# Patient Record
Sex: Female | Born: 1945 | Race: White | Hispanic: No | Marital: Married | Smoking: Never smoker
Health system: Southern US, Community
[De-identification: ages and names within clinical notes are randomized; demographics above are authoritative.]

## PROBLEM LIST (undated history)

## (undated) DIAGNOSIS — R51 Headache: Secondary | ICD-10-CM

## (undated) DIAGNOSIS — R519 Headache, unspecified: Secondary | ICD-10-CM

## (undated) DIAGNOSIS — Z9889 Other specified postprocedural states: Secondary | ICD-10-CM

## (undated) DIAGNOSIS — R112 Nausea with vomiting, unspecified: Secondary | ICD-10-CM

## (undated) DIAGNOSIS — L719 Rosacea, unspecified: Secondary | ICD-10-CM

## (undated) DIAGNOSIS — M8580 Other specified disorders of bone density and structure, unspecified site: Secondary | ICD-10-CM

## (undated) DIAGNOSIS — K5790 Diverticulosis of intestine, part unspecified, without perforation or abscess without bleeding: Secondary | ICD-10-CM

## (undated) DIAGNOSIS — I10 Essential (primary) hypertension: Secondary | ICD-10-CM

## (undated) DIAGNOSIS — L570 Actinic keratosis: Secondary | ICD-10-CM

## (undated) DIAGNOSIS — M1990 Unspecified osteoarthritis, unspecified site: Secondary | ICD-10-CM

## (undated) DIAGNOSIS — M2550 Pain in unspecified joint: Secondary | ICD-10-CM

## (undated) HISTORY — PX: VAGINAL HYSTERECTOMY: SUR661

## (undated) HISTORY — DX: Rosacea, unspecified: L71.9

## (undated) HISTORY — PX: COLONOSCOPY: SHX174

## (undated) HISTORY — PX: REFRACTIVE SURGERY: SHX103

## (undated) HISTORY — PX: JOINT REPLACEMENT: SHX530

## (undated) HISTORY — PX: TONSILLECTOMY: SUR1361

## (undated) HISTORY — PX: CHOLECYSTECTOMY: SHX55

## (undated) HISTORY — DX: Actinic keratosis: L57.0

---

## 1999-02-13 ENCOUNTER — Other Ambulatory Visit: Admission: RE | Admit: 1999-02-13 | Discharge: 1999-02-13 | Payer: Self-pay | Admitting: Obstetrics and Gynecology

## 2000-03-11 ENCOUNTER — Other Ambulatory Visit: Admission: RE | Admit: 2000-03-11 | Discharge: 2000-03-11 | Payer: Self-pay | Admitting: Obstetrics and Gynecology

## 2001-03-23 ENCOUNTER — Other Ambulatory Visit: Admission: RE | Admit: 2001-03-23 | Discharge: 2001-03-23 | Payer: Self-pay | Admitting: Obstetrics and Gynecology

## 2002-03-23 ENCOUNTER — Other Ambulatory Visit: Admission: RE | Admit: 2002-03-23 | Discharge: 2002-03-23 | Payer: Self-pay | Admitting: Obstetrics and Gynecology

## 2003-04-17 ENCOUNTER — Other Ambulatory Visit: Admission: RE | Admit: 2003-04-17 | Discharge: 2003-04-17 | Payer: Self-pay | Admitting: Obstetrics and Gynecology

## 2004-04-17 ENCOUNTER — Other Ambulatory Visit: Admission: RE | Admit: 2004-04-17 | Discharge: 2004-04-17 | Payer: Self-pay | Admitting: Obstetrics and Gynecology

## 2004-06-04 ENCOUNTER — Ambulatory Visit: Payer: Self-pay | Admitting: Obstetrics and Gynecology

## 2005-04-18 ENCOUNTER — Other Ambulatory Visit: Admission: RE | Admit: 2005-04-18 | Discharge: 2005-04-18 | Payer: Self-pay | Admitting: Obstetrics and Gynecology

## 2005-06-30 ENCOUNTER — Ambulatory Visit: Payer: Self-pay | Admitting: Obstetrics and Gynecology

## 2005-07-04 ENCOUNTER — Ambulatory Visit: Payer: Self-pay | Admitting: Obstetrics and Gynecology

## 2006-06-17 ENCOUNTER — Other Ambulatory Visit: Admission: RE | Admit: 2006-06-17 | Discharge: 2006-06-17 | Payer: Self-pay | Admitting: Obstetrics and Gynecology

## 2006-07-02 ENCOUNTER — Ambulatory Visit: Payer: Self-pay | Admitting: Obstetrics and Gynecology

## 2007-06-21 ENCOUNTER — Other Ambulatory Visit: Admission: RE | Admit: 2007-06-21 | Discharge: 2007-06-21 | Payer: Self-pay | Admitting: Obstetrics and Gynecology

## 2007-08-05 ENCOUNTER — Ambulatory Visit: Payer: Self-pay | Admitting: Obstetrics and Gynecology

## 2008-06-22 ENCOUNTER — Other Ambulatory Visit: Admission: RE | Admit: 2008-06-22 | Discharge: 2008-06-22 | Payer: Self-pay | Admitting: Obstetrics and Gynecology

## 2008-06-22 ENCOUNTER — Encounter: Payer: Self-pay | Admitting: Obstetrics and Gynecology

## 2008-06-22 ENCOUNTER — Ambulatory Visit: Payer: Self-pay | Admitting: Obstetrics and Gynecology

## 2009-06-28 ENCOUNTER — Ambulatory Visit: Payer: Self-pay | Admitting: Obstetrics and Gynecology

## 2009-06-28 ENCOUNTER — Other Ambulatory Visit: Admission: RE | Admit: 2009-06-28 | Discharge: 2009-06-28 | Payer: Self-pay | Admitting: Obstetrics and Gynecology

## 2009-08-09 ENCOUNTER — Ambulatory Visit: Payer: Self-pay | Admitting: Obstetrics and Gynecology

## 2009-10-26 LAB — HM COLONOSCOPY: HM Colonoscopy: NORMAL

## 2010-07-16 ENCOUNTER — Encounter (INDEPENDENT_AMBULATORY_CARE_PROVIDER_SITE_OTHER): Payer: BC Managed Care – PPO | Admitting: Obstetrics and Gynecology

## 2010-07-16 ENCOUNTER — Other Ambulatory Visit (HOSPITAL_COMMUNITY)
Admission: RE | Admit: 2010-07-16 | Discharge: 2010-07-16 | Disposition: A | Discharge status: Home / Self Care | Payer: BC Managed Care – PPO | Source: Ambulatory Visit | Attending: Obstetrics and Gynecology | Admitting: Obstetrics and Gynecology

## 2010-07-16 ENCOUNTER — Other Ambulatory Visit: Payer: Self-pay | Admitting: Obstetrics and Gynecology

## 2010-07-16 DIAGNOSIS — Z13220 Encounter for screening for lipoid disorders: Secondary | ICD-10-CM

## 2010-07-16 DIAGNOSIS — Z124 Encounter for screening for malignant neoplasm of cervix: Secondary | ICD-10-CM | POA: Insufficient documentation

## 2010-07-16 DIAGNOSIS — Z833 Family history of diabetes mellitus: Secondary | ICD-10-CM

## 2010-07-16 DIAGNOSIS — Z01419 Encounter for gynecological examination (general) (routine) without abnormal findings: Secondary | ICD-10-CM

## 2010-08-13 ENCOUNTER — Ambulatory Visit: Payer: Self-pay | Admitting: Obstetrics and Gynecology

## 2010-10-27 LAB — HM PAP SMEAR: HM Pap smear: NORMAL

## 2011-04-15 HISTORY — PX: EYE SURGERY: SHX253

## 2011-07-08 ENCOUNTER — Encounter: Payer: Self-pay | Admitting: Gynecology

## 2011-07-08 DIAGNOSIS — L719 Rosacea, unspecified: Secondary | ICD-10-CM | POA: Insufficient documentation

## 2011-07-08 DIAGNOSIS — N938 Other specified abnormal uterine and vaginal bleeding: Secondary | ICD-10-CM | POA: Insufficient documentation

## 2011-07-18 ENCOUNTER — Encounter: Payer: Self-pay | Admitting: Obstetrics and Gynecology

## 2011-07-18 ENCOUNTER — Ambulatory Visit (INDEPENDENT_AMBULATORY_CARE_PROVIDER_SITE_OTHER): Payer: BC Managed Care – PPO | Admitting: Obstetrics and Gynecology

## 2011-07-18 VITALS — BP 160/104 | Ht 60.0 in | Wt 166.0 lb

## 2011-07-18 DIAGNOSIS — Z833 Family history of diabetes mellitus: Secondary | ICD-10-CM

## 2011-07-18 DIAGNOSIS — Z01419 Encounter for gynecological examination (general) (routine) without abnormal findings: Secondary | ICD-10-CM

## 2011-07-18 NOTE — Progress Notes (Signed)
Patient came to see me today for her annual GYN exam. She is doing fine without menopausal symptoms. She is scheduled for mammogram for next month. She is a normal bone density. She is having no vaginal bleeding. She is having no pelvic pain. Her blood pressure was elevated today. It has also been elevated at home.  HEENT: Within normal limits. Kennon Portela present Neck: No masses. Supraclavicular lymph nodes: Not enlarged. Breasts: Examined in both sitting and lying position. Symmetrical without skin changes or masses. Abdomen: Soft no masses guarding or rebound. No hernias. Pelvic: External within normal limits. BUS within normal limits. Vaginal examination shows good estrogen effect, no cystocele enterocele or rectocele. Cervix and uterus absent. Adnexa within normal limits. Rectovaginal confirmatory. Extremities within normal limits.  Assessment: Hypertension  Plan: Patient to get PCP now and see them regarding her blood pressure. Continue yearly mammograms.

## 2011-07-19 LAB — CBC WITH DIFFERENTIAL/PLATELET
Basophils Relative: 1 % (ref 0–1)
HCT: 44.1 % (ref 36.0–46.0)
Hemoglobin: 14.7 g/dL (ref 12.0–15.0)
Lymphs Abs: 2.6 10*3/uL (ref 0.7–4.0)
MCH: 30.2 pg (ref 26.0–34.0)
MCHC: 33.3 g/dL (ref 30.0–36.0)
Monocytes Absolute: 0.5 10*3/uL (ref 0.1–1.0)
Monocytes Relative: 8 % (ref 3–12)
Neutro Abs: 3.1 10*3/uL (ref 1.7–7.7)
RBC: 4.86 MIL/uL (ref 3.87–5.11)

## 2011-07-19 LAB — URINALYSIS W MICROSCOPIC + REFLEX CULTURE
Casts: NONE SEEN
Crystals: NONE SEEN
Leukocytes, UA: NEGATIVE
Nitrite: NEGATIVE
Specific Gravity, Urine: 1.012 (ref 1.005–1.030)
Urobilinogen, UA: 0.2 mg/dL (ref 0.0–1.0)
pH: 5 (ref 5.0–8.0)

## 2011-07-19 LAB — HEMOGLOBIN A1C
Hgb A1c MFr Bld: 5.6 % (ref <5.7)
Mean Plasma Glucose: 114 mg/dL (ref <117)

## 2011-07-19 LAB — CHOLESTEROL, TOTAL: Cholesterol: 167 mg/dL (ref 0–200)

## 2011-07-28 LAB — HM MAMMOGRAPHY: HM Mammogram: NORMAL

## 2011-08-14 ENCOUNTER — Ambulatory Visit: Payer: Self-pay | Admitting: Obstetrics and Gynecology

## 2011-08-20 ENCOUNTER — Encounter: Payer: Self-pay | Admitting: Obstetrics and Gynecology

## 2011-09-12 ENCOUNTER — Ambulatory Visit: Payer: BC Managed Care – PPO | Admitting: Internal Medicine

## 2011-10-27 ENCOUNTER — Ambulatory Visit (INDEPENDENT_AMBULATORY_CARE_PROVIDER_SITE_OTHER): Payer: BC Managed Care – PPO | Admitting: Internal Medicine

## 2011-10-27 ENCOUNTER — Encounter: Payer: Self-pay | Admitting: Internal Medicine

## 2011-10-27 VITALS — BP 162/100 | HR 90 | Temp 98.1°F | Ht 60.25 in | Wt 166.5 lb

## 2011-10-27 DIAGNOSIS — R229 Localized swelling, mass and lump, unspecified: Secondary | ICD-10-CM | POA: Insufficient documentation

## 2011-10-27 DIAGNOSIS — I10 Essential (primary) hypertension: Secondary | ICD-10-CM

## 2011-10-27 LAB — COMPREHENSIVE METABOLIC PANEL
Alkaline Phosphatase: 61 U/L (ref 39–117)
BUN: 20 mg/dL (ref 6–23)
Creatinine, Ser: 0.8 mg/dL (ref 0.4–1.2)
Glucose, Bld: 100 mg/dL — ABNORMAL HIGH (ref 70–99)
Sodium: 141 mEq/L (ref 135–145)
Total Bilirubin: 0.5 mg/dL (ref 0.3–1.2)

## 2011-10-27 LAB — MICROALBUMIN / CREATININE URINE RATIO: Microalb, Ur: 0.3 mg/dL (ref 0.0–1.9)

## 2011-10-27 NOTE — Assessment & Plan Note (Signed)
Freely movable subcutaneous nodule approximately one meter diameter over her left knee. Appearance most consistent with  lipoma. Discussed potential referral to surgery for resection. Patient would prefer to hold off for now. We'll continue to monitor.

## 2011-10-27 NOTE — Progress Notes (Signed)
Subjective:    Patient ID: Katherine Stanley, female    DOB: 05/28/1945, 66 y.o.   MRN: [id number]  HPI 66 year old female presents to establish care. She reports she is generally feeling well. She did not previously have a primary care physician. She has been followed by her OB/GYN. She notes that, at times, her blood pressure has been elevated, but recently at home has been near 120/80. She denies any headache, chest pain, palpitations. She notes that her blood pressure is elevated when visiting physicians, or on occasion after certain foods.  Outpatient Encounter Prescriptions as of 10/27/2011  Medication Sig Dispense Refill  . Acetaminophen (TYLENOL 8 HOUR PO) Take by mouth.      . Ascorbic Acid (VITAMIN C PO) Take by mouth.      . Azelaic Acid (FINACEA EX) Apply topically.      Marland Kitchen b complex vitamins tablet Take 1 tablet by mouth daily.      . Calcium Carbonate-Vitamin D (CALCIUM + D PO) Take by mouth.      . Cholecalciferol (VITAMIN D PO) Take by mouth.      . folic acid (FOLVITE) 400 MCG tablet Take 400 mcg by mouth daily.      . Garlic 10 MG CAPS Take 1 tablet by mouth daily.      Marland Kitchen LYSINE PO Take by mouth.      Marland Kitchen POTASSIUM PO Take by mouth.      Marland Kitchen VITAMIN E PO Take by mouth.        Review of Systems  Constitutional: Negative for fever, chills, appetite change, fatigue and unexpected weight change.  HENT: Negative for ear pain, congestion, sore throat, trouble swallowing, neck pain, voice change and sinus pressure.   Eyes: Negative for visual disturbance.  Respiratory: Negative for cough, shortness of breath, wheezing and stridor.   Cardiovascular: Negative for chest pain, palpitations and leg swelling.  Gastrointestinal: Negative for nausea, vomiting, abdominal pain, diarrhea, constipation, blood in stool, abdominal distention and anal bleeding.  Genitourinary: Negative for dysuria and flank pain.  Musculoskeletal: Negative for myalgias, arthralgias and gait problem.  Skin:  Negative for color change and rash.  Neurological: Negative for dizziness and headaches.  Hematological: Negative for adenopathy. Does not bruise/bleed easily.  Psychiatric/Behavioral: Negative for suicidal ideas, disturbed wake/sleep cycle and dysphoric mood. The patient is not nervous/anxious.    BP 162/100  Pulse 90  Temp 98.1 F (36.7 C) (Oral)  Ht 5' 0.25" (1.53 m)  Wt 166 lb 8 oz (75.524 kg)  BMI 32.25 kg/m2  SpO2 96%     Objective:   Physical Exam  Constitutional: She is oriented to person, place, and time. She appears well-developed and well-nourished. No distress.  HENT:  Head: Normocephalic and atraumatic.  Right Ear: External ear normal.  Left Ear: External ear normal.  Nose: Nose normal.  Mouth/Throat: Oropharynx is clear and moist. No oropharyngeal exudate.  Eyes: Conjunctivae are normal. Pupils are equal, round, and reactive to light. Right eye exhibits no discharge. Left eye exhibits no discharge. No scleral icterus.  Neck: Normal range of motion. Neck supple. No tracheal deviation present. No thyromegaly present.  Cardiovascular: Normal rate, regular rhythm, normal heart sounds and intact distal pulses.  Exam reveals no gallop and no friction rub.   No murmur heard. Pulmonary/Chest: Effort normal and breath sounds normal. No respiratory distress. She has no wheezes. She has no rales. She exhibits no tenderness.  Abdominal: Soft. Bowel sounds are normal. She exhibits no distension and  no mass. There is no tenderness. There is no guarding.  Musculoskeletal: Normal range of motion. She exhibits no edema and no tenderness.  Lymphadenopathy:    She has no cervical adenopathy.  Neurological: She is alert and oriented to person, place, and time. No cranial nerve deficit. She exhibits normal muscle tone. Coordination normal.  Skin: Skin is warm and dry. No rash noted. She is not diaphoretic. No erythema. No pallor.     Psychiatric: She has a normal mood and affect. Her  behavior is normal. Judgment and thought content normal.          Assessment & Plan:

## 2011-10-27 NOTE — Assessment & Plan Note (Signed)
Blood pressure elevated today. Patient reports blood pressure is better controlled at home. She will check her blood pressure one to 2 times per week and bring record of blood pressure readings to next visit or e-mail with results. If no improvement, would favor adding medication to better control blood pressure. Will check renal function with labs today. Followup one month.

## 2011-10-27 NOTE — Patient Instructions (Signed)
Please check blood pressure at home 1-2 times per week and email with results.

## 2011-11-27 ENCOUNTER — Encounter: Payer: Self-pay | Admitting: Internal Medicine

## 2011-11-27 ENCOUNTER — Ambulatory Visit (INDEPENDENT_AMBULATORY_CARE_PROVIDER_SITE_OTHER): Payer: BC Managed Care – PPO | Admitting: Internal Medicine

## 2011-11-27 VITALS — BP 162/94 | HR 82 | Temp 98.5°F | Ht 60.25 in | Wt 165.0 lb

## 2011-11-27 DIAGNOSIS — I10 Essential (primary) hypertension: Secondary | ICD-10-CM

## 2011-11-27 MED ORDER — LISINOPRIL 5 MG PO TABS: 5.0000 mg | ORAL_TABLET | Freq: Every day | ORAL | Status: DC

## 2011-11-27 NOTE — Assessment & Plan Note (Signed)
BP continues to be elevated. Will start lisinopril 5mg  daily. Check Cr and K next week. Follow up 1 month.

## 2011-11-27 NOTE — Progress Notes (Signed)
Subjective:    Patient ID: Katherine Stanley, female    DOB: 1945/08/06, 66 y.o.   MRN: [id number]  HPI 66YO female with HTN presents for follow up. BP typically 140s/90s at home this month. No chest pain, headache, palpitations, change in vision. Not currently taking meds for BP  Outpatient Encounter Prescriptions as of 11/27/2011  Medication Sig Dispense Refill  . Acetaminophen (TYLENOL 8 HOUR PO) Take by mouth.      . Ascorbic Acid (VITAMIN C PO) Take by mouth.      . Azelaic Acid (FINACEA EX) Apply topically.      Marland Kitchen b complex vitamins tablet Take 1 tablet by mouth daily.      . Calcium Carbonate-Vitamin D (CALCIUM + D PO) Take by mouth.      . Cholecalciferol (VITAMIN D PO) Take by mouth.      . folic acid (FOLVITE) 400 MCG tablet Take 400 mcg by mouth daily.      . Garlic 10 MG CAPS Take 1 tablet by mouth daily.      Marland Kitchen LYSINE PO Take by mouth.      Marland Kitchen POTASSIUM PO Take by mouth.      Marland Kitchen VITAMIN E PO Take by mouth.      Marland Kitchen lisinopril (PRINIVIL,ZESTRIL) 5 MG tablet Take 1 tablet (5 mg total) by mouth daily.  90 tablet  3   BP 162/94  Pulse 82  Temp 98.5 F (36.9 C) (Oral)  Ht 5' 0.25" (1.53 m)  Wt 165 lb (74.844 kg)  BMI 31.96 kg/m2  SpO2 93%  Review of Systems  Constitutional: Negative for fever, chills, appetite change, fatigue and unexpected weight change.  HENT: Negative for ear pain, congestion, sore throat, trouble swallowing, neck pain, voice change and sinus pressure.   Eyes: Negative for visual disturbance.  Respiratory: Negative for cough, shortness of breath, wheezing and stridor.   Cardiovascular: Negative for chest pain, palpitations and leg swelling.  Gastrointestinal: Negative for nausea, vomiting, abdominal pain, diarrhea, constipation, blood in stool, abdominal distention and anal bleeding.  Genitourinary: Negative for dysuria and flank pain.  Musculoskeletal: Negative for myalgias, arthralgias and gait problem.  Skin: Negative for color change and rash.    Neurological: Negative for dizziness and headaches.  Hematological: Negative for adenopathy. Does not bruise/bleed easily.  Psychiatric/Behavioral: Negative for suicidal ideas, disturbed wake/sleep cycle and dysphoric mood. The patient is not nervous/anxious.        Objective:   Physical Exam  Constitutional: She is oriented to person, place, and time. She appears well-developed and well-nourished. No distress.  HENT:  Head: Normocephalic and atraumatic.  Right Ear: External ear normal.  Left Ear: External ear normal.  Nose: Nose normal.  Mouth/Throat: Oropharynx is clear and moist. No oropharyngeal exudate.  Eyes: Conjunctivae are normal. Pupils are equal, round, and reactive to light. Right eye exhibits no discharge. Left eye exhibits no discharge. No scleral icterus.  Neck: Normal range of motion. Neck supple. No tracheal deviation present. No thyromegaly present.  Cardiovascular: Normal rate, regular rhythm, normal heart sounds and intact distal pulses.  Exam reveals no gallop and no friction rub.   No murmur heard. Pulmonary/Chest: Effort normal and breath sounds normal. No respiratory distress. She has no wheezes. She has no rales. She exhibits no tenderness.  Musculoskeletal: Normal range of motion. She exhibits no edema and no tenderness.  Lymphadenopathy:    She has no cervical adenopathy.  Neurological: She is alert and oriented to person, place, and time.  No cranial nerve deficit. She exhibits normal muscle tone. Coordination normal.  Skin: Skin is warm and dry. No rash noted. She is not diaphoretic. No erythema. No pallor.  Psychiatric: She has a normal mood and affect. Her behavior is normal. Judgment and thought content normal.          Assessment & Plan:

## 2011-12-03 ENCOUNTER — Encounter: Payer: Self-pay | Admitting: Internal Medicine

## 2011-12-04 ENCOUNTER — Other Ambulatory Visit (INDEPENDENT_AMBULATORY_CARE_PROVIDER_SITE_OTHER): Payer: BC Managed Care – PPO | Admitting: *Deleted

## 2011-12-04 DIAGNOSIS — Z79899 Other long term (current) drug therapy: Secondary | ICD-10-CM

## 2011-12-04 LAB — BASIC METABOLIC PANEL
BUN: 20 mg/dL (ref 6–23)
Calcium: 9.2 mg/dL (ref 8.4–10.5)
Creatinine, Ser: 0.6 mg/dL (ref 0.4–1.2)
GFR: 110.95 mL/min (ref >60.00)

## 2011-12-05 ENCOUNTER — Other Ambulatory Visit: Payer: BC Managed Care – PPO

## 2012-01-02 ENCOUNTER — Ambulatory Visit (INDEPENDENT_AMBULATORY_CARE_PROVIDER_SITE_OTHER): Payer: BC Managed Care – PPO | Admitting: Internal Medicine

## 2012-01-02 ENCOUNTER — Encounter: Payer: Self-pay | Admitting: Internal Medicine

## 2012-01-02 VITALS — BP 120/92 | HR 88 | Temp 98.6°F | Ht 60.25 in | Wt 162.5 lb

## 2012-01-02 DIAGNOSIS — I10 Essential (primary) hypertension: Secondary | ICD-10-CM

## 2012-01-02 MED ORDER — LISINOPRIL 5 MG PO TABS: 5.0000 mg | ORAL_TABLET | Freq: Every day | ORAL | Status: DC

## 2012-01-02 NOTE — Progress Notes (Signed)
Subjective:    Patient ID: Katherine Stanley, female    DOB: [birth year], 66 y.o.   MRN: [id number]  HPI 66YO female with HTN presents for follow up. Started on Lisinopril at last visit. Tolerating medication well with no noted side effects. Denies chest pain, palpitations, headache. BP at home 110-120s/60-80s mostly.  Outpatient Encounter Prescriptions as of 01/02/2012  Medication Sig Dispense Refill  . Acetaminophen (TYLENOL 8 HOUR PO) Take by mouth.      . Ascorbic Acid (VITAMIN C PO) Take by mouth.      . Azelaic Acid (FINACEA EX) Apply topically.      Marland Kitchen b complex vitamins tablet Take 1 tablet by mouth daily.      . Calcium Carbonate-Vitamin D (CALCIUM + D PO) Take by mouth.      . Cholecalciferol (VITAMIN D PO) Take by mouth.      . folic acid (FOLVITE) 400 MCG tablet Take 400 mcg by mouth daily.      . Garlic 10 MG CAPS Take 1 tablet by mouth daily.      Marland Kitchen lisinopril (PRINIVIL,ZESTRIL) 5 MG tablet Take 1 tablet (5 mg total) by mouth daily.  90 tablet  3  . LYSINE PO Take by mouth.      Marland Kitchen VITAMIN E PO Take by mouth.      . DISCONTD: lisinopril (PRINIVIL,ZESTRIL) 5 MG tablet Take 1 tablet (5 mg total) by mouth daily.  90 tablet  3  . DISCONTD: POTASSIUM PO Take by mouth.       BP 120/92  Pulse 88  Temp 98.6 F (37 C) (Oral)  Ht 5' 0.25" (1.53 m)  Wt 162 lb 8 oz (73.71 kg)  BMI 31.47 kg/m2  SpO2 98%  Review of Systems  Constitutional: Negative for fever, chills, appetite change, fatigue and unexpected weight change.  HENT: Negative for ear pain, congestion, sore throat, trouble swallowing, neck pain, voice change and sinus pressure.   Eyes: Negative for visual disturbance.  Respiratory: Negative for cough, shortness of breath, wheezing and stridor.   Cardiovascular: Negative for chest pain, palpitations and leg swelling.  Gastrointestinal: Negative for nausea, vomiting, abdominal pain, diarrhea, constipation, blood in stool, abdominal distention and anal bleeding.    Genitourinary: Negative for dysuria and flank pain.  Musculoskeletal: Negative for myalgias, arthralgias and gait problem.  Skin: Negative for color change and rash.  Neurological: Negative for dizziness and headaches.  Hematological: Negative for adenopathy. Does not bruise/bleed easily.  Psychiatric/Behavioral: Negative for suicidal ideas, disturbed wake/sleep cycle and dysphoric mood. The patient is not nervous/anxious.        Objective:   Physical Exam  Constitutional: She is oriented to person, place, and time. She appears well-developed and well-nourished. No distress.  HENT:  Head: Normocephalic and atraumatic.  Right Ear: External ear normal.  Left Ear: External ear normal.  Nose: Nose normal.  Mouth/Throat: Oropharynx is clear and moist. No oropharyngeal exudate.  Eyes: Conjunctivae normal are normal. Pupils are equal, round, and reactive to light. Right eye exhibits no discharge. Left eye exhibits no discharge. No scleral icterus.  Neck: Normal range of motion. Neck supple. No tracheal deviation present. No thyromegaly present.  Cardiovascular: Normal rate, regular rhythm, normal heart sounds and intact distal pulses.  Exam reveals no gallop and no friction rub.   No murmur heard. Pulmonary/Chest: Effort normal and breath sounds normal. No respiratory distress. She has no wheezes. She has no rales. She exhibits no tenderness.  Musculoskeletal: Normal range of motion.  She exhibits no edema and no tenderness.  Lymphadenopathy:    She has no cervical adenopathy.  Neurological: She is alert and oriented to person, place, and time. No cranial nerve deficit. She exhibits normal muscle tone. Coordination normal.  Skin: Skin is warm and dry. No rash noted. She is not diaphoretic. No erythema. No pallor.  Psychiatric: She has a normal mood and affect. Her behavior is normal. Judgment and thought content normal.          Assessment & Plan:

## 2012-01-02 NOTE — Assessment & Plan Note (Signed)
BP well controlled at home. Improved on check today, diastolic still slightly elevated. Will continue lisinopril. Pt will monitor BP and call if >140/90. Follow up 3 months for recheck.

## 2012-04-05 ENCOUNTER — Encounter: Payer: Self-pay | Admitting: Internal Medicine

## 2012-04-05 ENCOUNTER — Ambulatory Visit (INDEPENDENT_AMBULATORY_CARE_PROVIDER_SITE_OTHER): Payer: Medicare Other | Admitting: Internal Medicine

## 2012-04-05 VITALS — BP 128/90 | HR 85 | Temp 98.2°F | Resp 16 | Wt 162.5 lb

## 2012-04-05 DIAGNOSIS — H029 Unspecified disorder of eyelid: Secondary | ICD-10-CM | POA: Insufficient documentation

## 2012-04-05 DIAGNOSIS — I10 Essential (primary) hypertension: Secondary | ICD-10-CM | POA: Diagnosis not present

## 2012-04-05 DIAGNOSIS — E119 Type 2 diabetes mellitus without complications: Secondary | ICD-10-CM

## 2012-04-05 DIAGNOSIS — D649 Anemia, unspecified: Secondary | ICD-10-CM

## 2012-04-05 DIAGNOSIS — E785 Hyperlipidemia, unspecified: Secondary | ICD-10-CM

## 2012-04-05 DIAGNOSIS — Z1331 Encounter for screening for depression: Secondary | ICD-10-CM

## 2012-04-05 NOTE — Assessment & Plan Note (Signed)
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 Will set up with ophthalmology for biopsy.

## 2012-04-05 NOTE — Progress Notes (Signed)
Subjective:    Patient ID: Katherine Stanley, female    DOB: [birth year], 66 y.o.   MRN: [id number]  HPI 66 year old female with history of hypertension presents for followup. She reports her blood pressure has been well-controlled at home, typically 120/70. She reports compliance with her medication, lisinopril. She denies any headache, chest pain, palpitations. Next  She is concerned today about a lesion on her right lower eyelid which has been present for approximately 4 weeks. The lesion has not changed much in size. It is not tender. She denies any drainage from her eye or change in vision. She denies injury to her eye.  Outpatient Encounter Prescriptions as of 04/05/2012  Medication Sig Dispense Refill  . Acetaminophen (TYLENOL 8 HOUR PO) Take by mouth.      . Ascorbic Acid (VITAMIN C PO) Take by mouth.      . Azelaic Acid (FINACEA EX) Apply topically.      Marland Kitchen b complex vitamins tablet Take 1 tablet by mouth daily.      . Calcium Carbonate-Vitamin D (CALCIUM + D PO) Take by mouth.      . Cholecalciferol (VITAMIN D PO) Take by mouth.      . folic acid (FOLVITE) 400 MCG tablet Take 400 mcg by mouth daily.      . Garlic 10 MG CAPS Take 1 tablet by mouth daily.      Marland Kitchen lisinopril (PRINIVIL,ZESTRIL) 5 MG tablet Take 1 tablet (5 mg total) by mouth daily.  90 tablet  3  . LYSINE PO Take by mouth.      Marland Kitchen VITAMIN E PO Take by mouth.       BP 128/90  Pulse 85  Temp 98.2 F (36.8 C) (Oral)  Resp 16  Wt 162 lb 8 oz (73.71 kg)  Review of Systems  Constitutional: Negative for fever, chills, appetite change, fatigue and unexpected weight change.  HENT: Negative for ear pain, congestion, sore throat, trouble swallowing, neck pain, voice change and sinus pressure.   Eyes: Negative for visual disturbance.  Respiratory: Negative for cough, shortness of breath, wheezing and stridor.   Cardiovascular: Negative for chest pain, palpitations and leg swelling.  Gastrointestinal: Negative for nausea,  vomiting, abdominal pain, diarrhea, constipation, blood in stool, abdominal distention and anal bleeding.  Genitourinary: Negative for dysuria and flank pain.  Musculoskeletal: Negative for myalgias, arthralgias and gait problem.  Skin: Negative for color change and rash.  Neurological: Negative for dizziness and headaches.  Hematological: Negative for adenopathy. Does not bruise/bleed easily.  Psychiatric/Behavioral: Negative for suicidal ideas, sleep disturbance and dysphoric mood. The patient is not nervous/anxious.        Objective:   Physical Exam  Constitutional: She is oriented to person, place, and time. She appears well-developed and well-nourished. No distress.  HENT:  Head: Normocephalic and atraumatic.  Right Ear: External ear normal.  Left Ear: External ear normal.  Nose: Nose normal.  Mouth/Throat: Oropharynx is clear and moist. No oropharyngeal exudate.  Eyes: Conjunctivae normal are normal. Pupils are equal, round, and reactive to light. Right eye exhibits no discharge. Left eye exhibits no discharge. No scleral icterus.    Neck: Normal range of motion. Neck supple. No tracheal deviation present. No thyromegaly present.  Cardiovascular: Normal rate, regular rhythm, normal heart sounds and intact distal pulses.  Exam reveals no gallop and no friction rub.   No murmur heard. Pulmonary/Chest: Effort normal and breath sounds normal. No respiratory distress. She has no wheezes. She has no rales.  She exhibits no tenderness.  Musculoskeletal: Normal range of motion. She exhibits no edema and no tenderness.  Lymphadenopathy:    She has no cervical adenopathy.  Neurological: She is alert and oriented to person, place, and time. No cranial nerve deficit. She exhibits normal muscle tone. Coordination normal.  Skin: Skin is warm and dry. No rash noted. She is not diaphoretic. No erythema. No pallor.  Psychiatric: She has a normal mood and affect. Her behavior is normal. Judgment  and thought content normal.          Assessment & Plan:

## 2012-04-05 NOTE — Assessment & Plan Note (Signed)
BP well controlled on current medication. Will continue. Will check renal function with labs today. Follow up 3-6 months and prn.

## 2012-07-09 ENCOUNTER — Other Ambulatory Visit (INDEPENDENT_AMBULATORY_CARE_PROVIDER_SITE_OTHER): Payer: Medicare Other

## 2012-07-09 DIAGNOSIS — D649 Anemia, unspecified: Secondary | ICD-10-CM | POA: Diagnosis not present

## 2012-07-09 DIAGNOSIS — I10 Essential (primary) hypertension: Secondary | ICD-10-CM | POA: Diagnosis not present

## 2012-07-09 DIAGNOSIS — E119 Type 2 diabetes mellitus without complications: Secondary | ICD-10-CM

## 2012-07-09 DIAGNOSIS — E785 Hyperlipidemia, unspecified: Secondary | ICD-10-CM | POA: Diagnosis not present

## 2012-07-09 LAB — COMPREHENSIVE METABOLIC PANEL
AST: 24 U/L (ref 0–37)
Albumin: 4.2 g/dL (ref 3.5–5.2)
Alkaline Phosphatase: 62 U/L (ref 39–117)
Glucose, Bld: 108 mg/dL — ABNORMAL HIGH (ref 70–99)
Potassium: 4.3 mEq/L (ref 3.5–5.1)
Sodium: 136 mEq/L (ref 135–145)
Total Protein: 7.5 g/dL (ref 6.0–8.3)

## 2012-07-09 LAB — CBC WITH DIFFERENTIAL/PLATELET
Basophils Absolute: 0.1 10*3/uL (ref 0.0–0.1)
Eosinophils Absolute: 0.1 10*3/uL (ref 0.0–0.7)
MCHC: 34 g/dL (ref 30.0–36.0)
MCV: 88.7 fl (ref 78.0–100.0)
Monocytes Absolute: 0.5 10*3/uL (ref 0.1–1.0)
Neutrophils Relative %: 50.1 % (ref 43.0–77.0)
Platelets: 261 10*3/uL (ref 150.0–400.0)
WBC: 6.3 10*3/uL (ref 4.5–10.5)

## 2012-07-09 LAB — LIPID PANEL
Cholesterol: 165 mg/dL (ref 0–200)
Triglycerides: 125 mg/dL (ref 0.0–149.0)

## 2012-07-09 LAB — BASIC METABOLIC PANEL
Chloride: 106 mEq/L (ref 96–112)
Creatinine, Ser: 0.7 mg/dL (ref 0.4–1.2)

## 2012-07-09 LAB — MICROALBUMIN / CREATININE URINE RATIO
Creatinine,U: 22.5 mg/dL
Microalb Creat Ratio: 0.4 mg/g (ref 0.0–30.0)

## 2012-07-19 ENCOUNTER — Encounter: Payer: Self-pay | Admitting: Internal Medicine

## 2012-07-19 ENCOUNTER — Ambulatory Visit (INDEPENDENT_AMBULATORY_CARE_PROVIDER_SITE_OTHER): Payer: Medicare Other | Admitting: Internal Medicine

## 2012-07-19 VITALS — BP 160/94 | HR 96 | Temp 98.1°F | Ht 60.0 in | Wt 167.0 lb

## 2012-07-19 DIAGNOSIS — Z0000 Encounter for general adult medical examination without abnormal findings: Secondary | ICD-10-CM | POA: Diagnosis not present

## 2012-07-19 DIAGNOSIS — I10 Essential (primary) hypertension: Secondary | ICD-10-CM

## 2012-07-19 DIAGNOSIS — Z780 Asymptomatic menopausal state: Secondary | ICD-10-CM | POA: Diagnosis not present

## 2012-07-19 DIAGNOSIS — Z1239 Encounter for other screening for malignant neoplasm of breast: Secondary | ICD-10-CM | POA: Insufficient documentation

## 2012-07-19 NOTE — Assessment & Plan Note (Signed)
BP Readings from Last 3 Encounters:  07/19/12 160/94  04/05/12 128/90  01/02/12 120/92   Pressure elevated today. However, patient reports has been well-controlled at home, typically 120s over 80s. we'll have her continue to monitor at home and followup at the end of this week for nurse recheck of blood pressure. If persistently elevated, will increase lisinopril to 10mg  daily. Follow up visit 1 month.

## 2012-07-19 NOTE — Progress Notes (Signed)
Subjective:    Patient ID: Katherine Stanley, female    DOB: [birth year], 67 y.o.   MRN: [id number]  HPI The patient is here for annual Medicare wellness examination and management of other chronic and acute problems.   The risk factors are reflected in the social history.  The roster of all physicians providing medical care to patient - is listed in the Snapshot section of the chart.  Activities of daily living:  The patient is 100% independent in all ADLs: dressing, toileting, feeding as well as independent mobility  Home safety : The patient has smoke detectors in the home. They wear seatbelts.  There are no firearms at home. There is no violence in the home.   There is no risks for hepatitis, STDs or HIV. There is no history of blood transfusion. They have no travel history to infectious disease endemic areas of the world.  The patient has seen their dentist in the last six month. (Dr. Judithann Sheen) They have seen their eye doctor in the last year. (Dr. Gentry Fitz, not seen since [place]) No issues with hearing.  They have deferred audiologic testing in the last year.   They do not  have excessive sun exposure. Discussed the need for sun protection: hats, long sleeves and use of sunscreen if there is significant sun exposure. (Dermatologist - Dr. Wende Neighbors)  Diet: the importance of a healthy diet is discussed. They do have a healthy diet.  The benefits of regular aerobic exercise were discussed. She plays golf regularly.  Depression screen: there are no signs or vegative symptoms of depression- irritability, change in appetite, anhedonia, sadness/tearfullness.  Cognitive assessment: the patient manages all their financial and personal affairs and is actively engaged. They could relate day,date,year and events.  HCPOA - Genevie Cheshire, husband.  The following portions of the patient's history were reviewed and updated as appropriate: allergies, current medications, past family history, past medical  history,  past surgical history, past social history  and problem list.  Visual acuity was not assessed per patient preference since she has regular follow up with her ophthalmologist. Hearing and body mass index were assessed and reviewed.   During the course of the visit the patient was educated and counseled about appropriate screening and preventive services including : fall prevention , diabetes screening, nutrition counseling, colorectal cancer screening, and recommended immunizations.    Pt reports BP has been well controlled at home,  typically 120/80 or less. She reports compliance with lisinopril. She denies any chest pain, palpitations, headache.  Outpatient Encounter Prescriptions as of 07/19/2012  Medication Sig Dispense Refill  . Acetaminophen (TYLENOL 8 HOUR PO) Take by mouth.      . Ascorbic Acid (VITAMIN C PO) Take by mouth.      . Azelaic Acid (FINACEA EX) Apply topically.      Marland Kitchen b complex vitamins tablet Take 1 tablet by mouth daily.      . Calcium Carbonate-Vitamin D (CALCIUM + D PO) Take by mouth.      . Cholecalciferol (VITAMIN D PO) Take by mouth.      . folic acid (FOLVITE) 400 MCG tablet Take 400 mcg by mouth daily.      . Garlic 10 MG CAPS Take 1 tablet by mouth daily.      . Glucosamine-Chondroit-Vit C-Mn (GLUCOSAMINE CHONDR 500 COMPLEX PO) Take 500 mg by mouth daily.      Marland Kitchen lisinopril (PRINIVIL,ZESTRIL) 5 MG tablet Take 1 tablet (5 mg total) by mouth daily.  90 tablet  3  . LYSINE PO Take by mouth.      Marland Kitchen VITAMIN E PO Take by mouth.       No facility-administered encounter medications on file as of 07/19/2012.   BP 160/94  Pulse 96  Temp(Src) 98.1 F (36.7 C) (Oral)  Ht 5' (1.524 m)  Wt 167 lb (75.751 kg)  BMI 32.62 kg/m2  SpO2 96%   Review of Systems  Constitutional: Negative for fever, chills, appetite change, fatigue and unexpected weight change.  HENT: Negative for ear pain, congestion, sore throat, trouble swallowing, neck pain, voice change and sinus  pressure.   Eyes: Negative for visual disturbance.  Respiratory: Negative for cough, shortness of breath, wheezing and stridor.   Cardiovascular: Negative for chest pain, palpitations and leg swelling.  Gastrointestinal: Negative for nausea, vomiting, abdominal pain, diarrhea, constipation, blood in stool, abdominal distention and anal bleeding.  Genitourinary: Negative for dysuria and flank pain.  Musculoskeletal: Negative for myalgias, arthralgias and gait problem.  Skin: Negative for color change and rash.  Neurological: Negative for dizziness and headaches.  Hematological: Negative for adenopathy. Does not bruise/bleed easily.  Psychiatric/Behavioral: Negative for suicidal ideas, sleep disturbance and dysphoric mood. The patient is not nervous/anxious.        Objective:   Physical Exam  Constitutional: She is oriented to person, place, and time. She appears well-developed and well-nourished. No distress.  HENT:  Head: Normocephalic and atraumatic.  Right Ear: External ear normal.  Left Ear: External ear normal.  Nose: Nose normal.  Mouth/Throat: Oropharynx is clear and moist. No oropharyngeal exudate.  Eyes: Conjunctivae are normal. Pupils are equal, round, and reactive to light. Right eye exhibits no discharge. Left eye exhibits no discharge. No scleral icterus.  Neck: Normal range of motion. Neck supple. No tracheal deviation present. No thyromegaly present.  Cardiovascular: Normal rate, regular rhythm, normal heart sounds and intact distal pulses.  Exam reveals no gallop and no friction rub.   No murmur heard. Pulmonary/Chest: Effort normal and breath sounds normal. No accessory muscle usage. Not tachypneic. No respiratory distress. She has no decreased breath sounds. She has no wheezes. She has no rhonchi. She has no rales. She exhibits no tenderness. Right breast exhibits no inverted nipple, no mass, no nipple discharge, no skin change and no tenderness. Left breast exhibits no  inverted nipple, no mass, no nipple discharge, no skin change and no tenderness. Breasts are symmetrical.  Abdominal: Soft. Bowel sounds are normal. She exhibits no distension. There is no tenderness. There is no rebound and no guarding.  Musculoskeletal: Normal range of motion. She exhibits no edema and no tenderness.  Lymphadenopathy:    She has no cervical adenopathy.  Neurological: She is alert and oriented to person, place, and time. No cranial nerve deficit. She exhibits normal muscle tone. Coordination normal.  Skin: Skin is warm and dry. No rash noted. She is not diaphoretic. No erythema. No pallor.  Psychiatric: She has a normal mood and affect. Her behavior is normal. Judgment and thought content normal.          Assessment & Plan:

## 2012-07-19 NOTE — Assessment & Plan Note (Signed)
General medical exam including breast exam normal today. PAP deferred as all previous normal and per pt preference. Mammogram pending. DEXA pending. Reviewed recent labs. Appropriate screening performed. Encouraged continued efforts at healthy diet and regular physical activity.

## 2012-08-02 ENCOUNTER — Ambulatory Visit: Payer: Self-pay | Admitting: Internal Medicine

## 2012-08-02 DIAGNOSIS — Z780 Asymptomatic menopausal state: Secondary | ICD-10-CM | POA: Diagnosis not present

## 2012-08-02 DIAGNOSIS — M949 Disorder of cartilage, unspecified: Secondary | ICD-10-CM | POA: Diagnosis not present

## 2012-08-02 DIAGNOSIS — M899 Disorder of bone, unspecified: Secondary | ICD-10-CM | POA: Diagnosis not present

## 2012-08-03 ENCOUNTER — Telehealth: Payer: Self-pay | Admitting: Internal Medicine

## 2012-08-03 NOTE — Telephone Encounter (Signed)
Spoke with patient, informed of results and she verbally agreed. Has an appointment scheduled for 5/8

## 2012-08-03 NOTE — Telephone Encounter (Signed)
Bone density showed T-score -1.9. Has pt taken medications for bone loss in the past? I would like to set up a visit to discuss.

## 2012-08-17 ENCOUNTER — Ambulatory Visit: Payer: Self-pay | Admitting: Internal Medicine

## 2012-08-17 DIAGNOSIS — Z1231 Encounter for screening mammogram for malignant neoplasm of breast: Secondary | ICD-10-CM | POA: Diagnosis not present

## 2012-08-19 ENCOUNTER — Ambulatory Visit: Payer: Medicare Other | Admitting: Internal Medicine

## 2012-08-19 ENCOUNTER — Encounter: Payer: Self-pay | Admitting: Internal Medicine

## 2012-08-19 LAB — HM MAMMOGRAPHY: HM MAMMO: NORMAL

## 2012-08-23 ENCOUNTER — Encounter: Payer: Self-pay | Admitting: Adult Health

## 2012-08-23 ENCOUNTER — Ambulatory Visit (INDEPENDENT_AMBULATORY_CARE_PROVIDER_SITE_OTHER): Payer: Medicare Other | Admitting: Adult Health

## 2012-08-23 VITALS — BP 142/82 | HR 70 | Temp 98.1°F | Resp 14 | Wt 167.0 lb

## 2012-08-23 DIAGNOSIS — M949 Disorder of cartilage, unspecified: Secondary | ICD-10-CM

## 2012-08-23 DIAGNOSIS — M899 Disorder of bone, unspecified: Secondary | ICD-10-CM

## 2012-08-23 DIAGNOSIS — M810 Age-related osteoporosis without current pathological fracture: Secondary | ICD-10-CM | POA: Insufficient documentation

## 2012-08-23 DIAGNOSIS — M8580 Other specified disorders of bone density and structure, unspecified site: Secondary | ICD-10-CM | POA: Insufficient documentation

## 2012-08-23 MED ORDER — ALENDRONATE SODIUM 35 MG PO TABS: 35.0000 mg | ORAL_TABLET | ORAL | Status: DC

## 2012-08-23 NOTE — Patient Instructions (Addendum)
  Please have your vitamin D level drawn.  I have called in medication, alendronate 35 mg weekly, to your pharmacy.  If your vitamin D level is normal you can begin taking this medication.  Remember to take this medication on an empty stomach with only water. Drink 8 ounces of water with medication. Do not eat or drink anything for 30 minutes after you have taken this medication. Make sure that you remain in an upright position.

## 2012-08-23 NOTE — Progress Notes (Signed)
  Subjective:    Patient ID: Katherine Stanley, female    DOB: [birth year], 67 y.o.   MRN: [id number]  HPI Patient is a 67 year old female who presents to clinic for the following:  1) Discuss treatment for osteopenia - patient recently had DEXA scan which showed osteopenia. She is taking calcium and vitamin D daily but is uncertain of the dosage. She has had a previous scan in the past which did not show any bone loss.  2) HTN f/u - she was previously seen in clinic with an elevated blood pressure with discussion of possibly going up on her lisinopril. She reports that her blood pressure has been less than 130 systolic and less than 80 diastolic. Her blood pressure is just slightly above target today; however, this may be attributed this to white coat syndrome.    Current Outpatient Prescriptions on File Prior to Visit  Medication Sig Dispense Refill  . Acetaminophen (TYLENOL 8 HOUR PO) Take by mouth.      . Ascorbic Acid (VITAMIN C PO) Take by mouth.      Marland Kitchen b complex vitamins tablet Take 1 tablet by mouth daily.      . Calcium Carbonate-Vitamin D (CALCIUM + D PO) Take by mouth.      . Cholecalciferol (VITAMIN D PO) Take by mouth.      . folic acid (FOLVITE) 400 MCG tablet Take 400 mcg by mouth daily.      . Garlic 10 MG CAPS Take 1 tablet by mouth daily.      . Glucosamine-Chondroit-Vit C-Mn (GLUCOSAMINE CHONDR 500 COMPLEX PO) Take 500 mg by mouth daily.      Marland Kitchen lisinopril (PRINIVIL,ZESTRIL) 5 MG tablet Take 1 tablet (5 mg total) by mouth daily.  90 tablet  3  . LYSINE PO Take by mouth.      Marland Kitchen VITAMIN E PO Take by mouth.       No current facility-administered medications on file prior to visit.    Review of Systems  Constitutional: Negative.   Respiratory: Negative for chest tightness, shortness of breath and wheezing.   Cardiovascular: Negative for chest pain, palpitations and leg swelling.  Gastrointestinal: Negative.   Neurological: Negative.   Psychiatric/Behavioral: Negative.     BP 142/82  Pulse 70  Temp(Src) 98.1 F (36.7 C) (Oral)  Resp 14  Wt 167 lb (75.751 kg)  BMI 32.62 kg/m2  SpO2 96%    Objective:   Physical Exam  Constitutional: She is oriented to person, place, and time. She appears well-developed and well-nourished. No distress.  HENT:  Head: Normocephalic and atraumatic.  Cardiovascular: Normal rate.   Pulmonary/Chest: Effort normal. No respiratory distress.  Musculoskeletal: Normal range of motion.  Neurological: She is alert and oriented to person, place, and time. Coordination normal.  Skin: Skin is warm and dry.  Psychiatric: She has a normal mood and affect. Her behavior is normal. Judgment and thought content normal.      Assessment & Plan:

## 2012-08-23 NOTE — Assessment & Plan Note (Addendum)
Discussed options of bisphosphonates. Patient is interested in a weekly dose. Check vitamin D levels today. If normal start alendronate 35 mg weekly. Educated on taking medication on an empty stomach with a full 8 ounces of water only. No mineral water. She is to stay in an upright position after taking this medication. No lying down. Discussed signs and symptoms to report. Note, spent greater than 30 minutes in face-to-face communication with patient on education and implementation of plan of care.

## 2012-08-24 LAB — VITAMIN D 25 HYDROXY (VIT D DEFICIENCY, FRACTURES): Vit D, 25-Hydroxy: 39 ng/mL (ref 30–89)

## 2012-09-10 ENCOUNTER — Encounter: Payer: Self-pay | Admitting: Internal Medicine

## 2012-09-20 ENCOUNTER — Encounter: Payer: Self-pay | Admitting: Internal Medicine

## 2012-10-01 ENCOUNTER — Ambulatory Visit (INDEPENDENT_AMBULATORY_CARE_PROVIDER_SITE_OTHER): Payer: Medicare Other | Admitting: Internal Medicine

## 2012-10-01 ENCOUNTER — Encounter: Payer: Self-pay | Admitting: Internal Medicine

## 2012-10-01 VITALS — BP 160/100 | HR 93 | Temp 98.0°F | Wt 164.0 lb

## 2012-10-01 DIAGNOSIS — M8580 Other specified disorders of bone density and structure, unspecified site: Secondary | ICD-10-CM

## 2012-10-01 DIAGNOSIS — M899 Disorder of bone, unspecified: Secondary | ICD-10-CM

## 2012-10-01 DIAGNOSIS — M949 Disorder of cartilage, unspecified: Secondary | ICD-10-CM | POA: Diagnosis not present

## 2012-10-01 DIAGNOSIS — I10 Essential (primary) hypertension: Secondary | ICD-10-CM

## 2012-10-01 NOTE — Progress Notes (Signed)
Subjective:    Patient ID: Katherine Stanley, female    DOB: [birth year], 67 y.o.   MRN: [id number]  HPI 67YO female with osteopenia and HTN presents for acute visit. Concerned about side effects from Alendronate including myalgia and arthralgia. Symptoms occurred a few days after taking the medication. They have now resolved. Symptoms were severe enough to limit physical activity for several days.  In regards to HTN, pt reports BP well controlled at home, typically 130s/80s. Pt is compliant with lisinopril. No chest pain, headache, palpitations.  Outpatient Encounter Prescriptions as of 10/01/2012  Medication Sig Dispense Refill  . Acetaminophen (TYLENOL 8 HOUR PO) Take by mouth.      . Ascorbic Acid (VITAMIN C PO) Take by mouth.      Marland Kitchen b complex vitamins tablet Take 1 tablet by mouth daily.      . Cholecalciferol (VITAMIN D PO) Take by mouth.      . folic acid (FOLVITE) 400 MCG tablet Take 400 mcg by mouth daily.      . Garlic 10 MG CAPS Take 1 tablet by mouth daily.      . Glucosamine-Chondroit-Vit C-Mn (GLUCOSAMINE CHONDR 500 COMPLEX PO) Take 500 mg by mouth daily.      Marland Kitchen lisinopril (PRINIVIL,ZESTRIL) 5 MG tablet Take 1 tablet (5 mg total) by mouth daily.  90 tablet  3  . LYSINE PO Take by mouth.      Marland Kitchen VITAMIN E PO Take by mouth.      . [DISCONTINUED] Calcium Carbonate-Vitamin D (CALCIUM + D PO) Take by mouth.      . [DISCONTINUED] alendronate (FOSAMAX) 35 MG tablet Take 1 tablet (35 mg total) by mouth every 7 (seven) days. Take with a full glass of water on an empty stomach.  4 tablet  3   No facility-administered encounter medications on file as of 10/01/2012.   BP 160/100  Pulse 93  Temp(Src) 98 F (36.7 C) (Oral)  Wt 164 lb (74.39 kg)  BMI 32.03 kg/m2  SpO2 96%  Review of Systems  Constitutional: Negative for fever, chills, appetite change, fatigue and unexpected weight change.  HENT: Negative for ear pain, congestion, sore throat, trouble swallowing, neck pain, voice change  and sinus pressure.   Eyes: Negative for visual disturbance.  Respiratory: Negative for cough, shortness of breath, wheezing and stridor.   Cardiovascular: Negative for chest pain, palpitations and leg swelling.  Gastrointestinal: Negative for nausea, vomiting, abdominal pain, diarrhea, constipation, blood in stool, abdominal distention and anal bleeding.  Genitourinary: Negative for dysuria and flank pain.  Musculoskeletal: Positive for arthralgias. Negative for myalgias and gait problem.  Skin: Negative for color change and rash.  Neurological: Negative for dizziness and headaches.  Hematological: Negative for adenopathy. Does not bruise/bleed easily.  Psychiatric/Behavioral: Negative for suicidal ideas, sleep disturbance and dysphoric mood. The patient is not nervous/anxious.        Objective:   Physical Exam  Constitutional: She is oriented to person, place, and time. She appears well-developed and well-nourished. No distress.  HENT:  Head: Normocephalic and atraumatic.  Right Ear: External ear normal.  Left Ear: External ear normal.  Nose: Nose normal.  Mouth/Throat: Oropharynx is clear and moist. No oropharyngeal exudate.  Eyes: Conjunctivae are normal. Pupils are equal, round, and reactive to light. Right eye exhibits no discharge. Left eye exhibits no discharge. No scleral icterus.  Neck: Normal range of motion. Neck supple. No tracheal deviation present. No thyromegaly present.  Cardiovascular: Normal rate, regular rhythm, normal  heart sounds and intact distal pulses.  Exam reveals no gallop and no friction rub.   No murmur heard. Pulmonary/Chest: Effort normal and breath sounds normal. No accessory muscle usage. Not tachypneic. No respiratory distress. She has no decreased breath sounds. She has no wheezes. She has no rhonchi. She has no rales. She exhibits no tenderness.  Musculoskeletal: Normal range of motion. She exhibits no edema and no tenderness.  Lymphadenopathy:     She has no cervical adenopathy.  Neurological: She is alert and oriented to person, place, and time. No cranial nerve deficit. She exhibits normal muscle tone. Coordination normal.  Skin: Skin is warm and dry. No rash noted. She is not diaphoretic. No erythema. No pallor.  Psychiatric: She has a normal mood and affect. Her behavior is normal. Judgment and thought content normal.          Assessment & Plan:

## 2012-10-01 NOTE — Assessment & Plan Note (Signed)
BP Readings from Last 3 Encounters:  10/01/12 160/100  08/23/12 142/82  07/19/12 160/94   BP elevated today, however pt reports well controlled at home, typically 130/80s. Will continue to monitor. If persistently elevated >140/90, then pt will call or email. If elevated, will increase dose of Lisinopril to 10mg  daily. Follow up 3 months.

## 2012-10-01 NOTE — Assessment & Plan Note (Signed)
Reviewed recent bone density test with T-score -1.9. Pt unable to tolerate alendronate because of arthralgia and myalgia. Discussed either trying another bisphosphonate or an alternative medication such as Prolia. Also discussed conservative option of increasing physical activity and ensuring adequate intake of dietary Ca and vit D. Will follow conservative measures for now and plan to repeat bone density in 07/2014.

## 2012-11-17 ENCOUNTER — Other Ambulatory Visit: Payer: Self-pay

## 2013-01-03 ENCOUNTER — Ambulatory Visit: Payer: Medicare Other | Admitting: Internal Medicine

## 2013-02-17 ENCOUNTER — Other Ambulatory Visit: Payer: Self-pay

## 2013-02-23 ENCOUNTER — Other Ambulatory Visit: Payer: Self-pay | Admitting: Internal Medicine

## 2013-02-24 ENCOUNTER — Ambulatory Visit (INDEPENDENT_AMBULATORY_CARE_PROVIDER_SITE_OTHER): Payer: Medicare Other

## 2013-02-24 DIAGNOSIS — Z23 Encounter for immunization: Secondary | ICD-10-CM | POA: Diagnosis not present

## 2013-03-31 ENCOUNTER — Encounter: Payer: Self-pay | Admitting: *Deleted

## 2013-04-11 DIAGNOSIS — M1710 Unilateral primary osteoarthritis, unspecified knee: Secondary | ICD-10-CM | POA: Diagnosis not present

## 2013-04-25 DIAGNOSIS — M1710 Unilateral primary osteoarthritis, unspecified knee: Secondary | ICD-10-CM | POA: Diagnosis not present

## 2013-05-02 DIAGNOSIS — M1710 Unilateral primary osteoarthritis, unspecified knee: Secondary | ICD-10-CM | POA: Diagnosis not present

## 2013-05-09 DIAGNOSIS — M1710 Unilateral primary osteoarthritis, unspecified knee: Secondary | ICD-10-CM | POA: Diagnosis not present

## 2013-05-18 DIAGNOSIS — L821 Other seborrheic keratosis: Secondary | ICD-10-CM | POA: Diagnosis not present

## 2013-05-18 DIAGNOSIS — L819 Disorder of pigmentation, unspecified: Secondary | ICD-10-CM | POA: Diagnosis not present

## 2013-07-20 ENCOUNTER — Telehealth: Payer: Self-pay | Admitting: Internal Medicine

## 2013-07-20 ENCOUNTER — Encounter: Payer: Self-pay | Admitting: Internal Medicine

## 2013-07-20 ENCOUNTER — Ambulatory Visit (INDEPENDENT_AMBULATORY_CARE_PROVIDER_SITE_OTHER): Payer: Medicare Other | Admitting: Internal Medicine

## 2013-07-20 VITALS — BP 130/90 | HR 104 | Temp 98.3°F | Ht 60.25 in | Wt 169.0 lb

## 2013-07-20 DIAGNOSIS — I10 Essential (primary) hypertension: Secondary | ICD-10-CM | POA: Diagnosis not present

## 2013-07-20 DIAGNOSIS — Z23 Encounter for immunization: Secondary | ICD-10-CM

## 2013-07-20 DIAGNOSIS — Z1239 Encounter for other screening for malignant neoplasm of breast: Secondary | ICD-10-CM

## 2013-07-20 DIAGNOSIS — Z0000 Encounter for general adult medical examination without abnormal findings: Secondary | ICD-10-CM

## 2013-07-20 LAB — COMPREHENSIVE METABOLIC PANEL
ALT: 22 U/L (ref 0–35)
AST: 18 U/L (ref 0–37)
Albumin: 4 g/dL (ref 3.5–5.2)
Alkaline Phosphatase: 57 U/L (ref 39–117)
BILIRUBIN TOTAL: 1 mg/dL (ref 0.3–1.2)
BUN: 27 mg/dL — ABNORMAL HIGH (ref 6–23)
CALCIUM: 9.3 mg/dL (ref 8.4–10.5)
CO2: 22 mEq/L (ref 19–32)
Chloride: 106 mEq/L (ref 96–112)
Creatinine, Ser: 0.6 mg/dL (ref 0.4–1.2)
GFR: 110.39 mL/min (ref >60.00)
GLUCOSE: 110 mg/dL — AB (ref 70–99)
POTASSIUM: 4.2 meq/L (ref 3.5–5.1)
Sodium: 136 mEq/L (ref 135–145)
TOTAL PROTEIN: 7.3 g/dL (ref 6.0–8.3)

## 2013-07-20 LAB — CBC WITH DIFFERENTIAL/PLATELET
BASOS PCT: 1 % (ref 0.0–3.0)
Basophils Absolute: 0.1 10*3/uL (ref 0.0–0.1)
EOS ABS: 0.1 10*3/uL (ref 0.0–0.7)
Eosinophils Relative: 1.2 % (ref 0.0–5.0)
HCT: 42.8 % (ref 36.0–46.0)
Hemoglobin: 14.5 g/dL (ref 12.0–15.0)
Lymphocytes Relative: 37.2 % (ref 12.0–46.0)
Lymphs Abs: 3 10*3/uL (ref 0.7–4.0)
MCHC: 33.8 g/dL (ref 30.0–36.0)
MCV: 92 fl (ref 78.0–100.0)
MONO ABS: 0.8 10*3/uL (ref 0.1–1.0)
Monocytes Relative: 9.4 % (ref 3.0–12.0)
NEUTROS PCT: 51.2 % (ref 43.0–77.0)
Neutro Abs: 4.1 10*3/uL (ref 1.4–7.7)
Platelets: 267 10*3/uL (ref 150.0–400.0)
RBC: 4.66 Mil/uL (ref 3.87–5.11)
RDW: 12.9 % (ref 11.5–14.6)
WBC: 8 10*3/uL (ref 4.5–10.5)

## 2013-07-20 LAB — LIPID PANEL
CHOLESTEROL: 157 mg/dL (ref 0–200)
HDL: 48.3 mg/dL (ref >39.00)
LDL Cholesterol: 82 mg/dL (ref 0–99)
Total CHOL/HDL Ratio: 3
Triglycerides: 136 mg/dL (ref 0.0–149.0)
VLDL: 27.2 mg/dL (ref 0.0–40.0)

## 2013-07-20 LAB — MICROALBUMIN / CREATININE URINE RATIO
CREATININE, U: 129.5 mg/dL
MICROALB UR: 0.4 mg/dL (ref 0.0–1.9)
MICROALB/CREAT RATIO: 0.3 mg/g (ref 0.0–30.0)

## 2013-07-20 MED ORDER — LISINOPRIL 5 MG PO TABS: ORAL_TABLET | ORAL | Status: DC

## 2013-07-20 NOTE — Telephone Encounter (Signed)
Relevant patient education assigned to patient using Emmi. ° °

## 2013-07-20 NOTE — Progress Notes (Signed)
Subjective:    Patient ID: Katherine Stanley, female    DOB: 12/03/1945, 68 y.o.   MRN: [id number]  HPI The patient is here for annual Medicare wellness examination and management of other chronic and acute problems.   The risk factors are reflected in the social history.  The roster of all physicians providing medical care to patient - is listed in the Snapshot section of the chart.  Activities of daily living:  The patient is 100% independent in all ADLs: dressing, toileting, feeding as well as independent mobility. Lives with husband. No pets. Lives in 3 story house. Has hardwood floors.  Home safety : The patient has smoke detectors in the home. They wear seatbelts.  There are no firearms at home. There is no violence in the home.   There is no risks for hepatitis, STDs or HIV. There is no history of blood transfusion. They have no travel history to infectious disease endemic areas of the world.  The patient has seen their dentist in the last six month. (Dr. Altha Harm) They have seen their eye doctor in the last year. (Dr. Steffanie Rainwater, not seen since [place]) No issues with hearing.  They have deferred audiologic testing in the last year.   They do not  have excessive sun exposure. Discussed the need for sun protection: hats, long sleeves and use of sunscreen if there is significant sun exposure. (Dermatologist - Dr. Lovell Sheehan) Ortho - Dr. Durward Fortes. S/p recent right knee steroid injections.  Diet: the importance of a healthy diet is discussed. They do have a healthy diet.  The benefits of regular aerobic exercise were discussed. She plays golf regularly. Also walking and doing stretches.  Depression screen: there are no signs or vegative symptoms of depression- irritability, change in appetite, anhedonia, sadness/tearfullness.  Cognitive assessment: the patient manages all their financial and personal affairs and is actively engaged. They could relate day,date,year and events.  HCPOA -  Katherine Stanley, husband.  The following portions of the patient's history were reviewed and updated as appropriate: allergies, current medications, past family history, past medical history,  past surgical history, past social history  and problem list.  Visual acuity was not assessed per patient preference since she has regular follow up with her ophthalmologist. Hearing and body mass index were assessed and reviewed.   During the course of the visit the patient was educated and counseled about appropriate screening and preventive services including : fall prevention , diabetes screening, nutrition counseling, colorectal cancer screening, and recommended immunizations.    Pt reports BP has been well controlled at home,  typically 120/80 or less. She reports compliance with lisinopril. She denies any chest pain, palpitations, headache.  Outpatient Encounter Prescriptions as of 07/20/2013  Medication Sig  . Acetaminophen (TYLENOL 8 HOUR PO) Take by mouth.  . Ascorbic Acid (VITAMIN C PO) Take by mouth.  Marland Kitchen b complex vitamins tablet Take 1 tablet by mouth daily.  . Cholecalciferol (VITAMIN D PO) Take by mouth.  . folic acid (FOLVITE) 166 MCG tablet Take 400 mcg by mouth daily.  . Garlic 10 MG CAPS Take 1 tablet by mouth daily.  . Glucosamine-Chondroit-Vit C-Mn (GLUCOSAMINE CHONDR 500 COMPLEX PO) Take 500 mg by mouth daily.  . Ibuprofen (ADVIL) 200 MG CAPS Take by mouth as needed.  Marland Kitchen lisinopril (PRINIVIL,ZESTRIL) 5 MG tablet TAKE ONE TABLET BY MOUTH EVERY DAY  . LYSINE PO Take by mouth.  Marland Kitchen VITAMIN E PO Take by mouth.  . [DISCONTINUED] lisinopril (PRINIVIL,ZESTRIL) 5 MG  tablet TAKE ONE TABLET BY MOUTH EVERY DAY  . Sodium Hyaluronate, Viscosup, (EUFLEXXA IX) Inject into the articular space once as needed.   BP 130/90  Pulse 104  Temp(Src) 98.3 F (36.8 C) (Oral)  Ht 5' 0.25" (1.53 m)  Wt 169 lb (76.658 kg)  BMI 32.75 kg/m2  SpO2 94%   Review of Systems  Constitutional: Negative for fever, chills,  appetite change, fatigue and unexpected weight change.  HENT: Negative for congestion, ear pain, sinus pressure, sore throat, trouble swallowing and voice change.   Eyes: Negative for visual disturbance.  Respiratory: Negative for cough, shortness of breath, wheezing and stridor.   Cardiovascular: Negative for chest pain, palpitations and leg swelling.  Gastrointestinal: Negative for nausea, vomiting, abdominal pain, diarrhea, constipation, blood in stool, abdominal distention and anal bleeding.  Genitourinary: Negative for dysuria and flank pain.  Musculoskeletal: Negative for arthralgias, gait problem, myalgias and neck pain.  Skin: Negative for color change and rash.  Neurological: Negative for dizziness and headaches.  Hematological: Negative for adenopathy. Does not bruise/bleed easily.  Psychiatric/Behavioral: Negative for suicidal ideas, sleep disturbance and dysphoric mood. The patient is not nervous/anxious.        Objective:   Physical Exam  Constitutional: She is oriented to person, place, and time. She appears well-developed and well-nourished. No distress.  HENT:  Head: Normocephalic and atraumatic.  Right Ear: External ear normal.  Left Ear: External ear normal.  Nose: Nose normal.  Mouth/Throat: Oropharynx is clear and moist. No oropharyngeal exudate.  Eyes: Conjunctivae are normal. Pupils are equal, round, and reactive to light. Right eye exhibits no discharge. Left eye exhibits no discharge. No scleral icterus.  Neck: Normal range of motion. Neck supple. No tracheal deviation present. No thyromegaly present.  Cardiovascular: Normal rate, regular rhythm, normal heart sounds and intact distal pulses.  Exam reveals no gallop and no friction rub.   No murmur heard. Pulmonary/Chest: Effort normal and breath sounds normal. No accessory muscle usage. Not tachypneic. No respiratory distress. She has no decreased breath sounds. She has no wheezes. She has no rales. She exhibits no  tenderness. Right breast exhibits no inverted nipple, no mass, no nipple discharge, no skin change and no tenderness. Left breast exhibits no inverted nipple, no mass, no nipple discharge, no skin change and no tenderness. Breasts are symmetrical.  Abdominal: Soft. Bowel sounds are normal. She exhibits no distension and no mass. There is no tenderness. There is no rebound and no guarding.  Musculoskeletal: Normal range of motion. She exhibits no edema and no tenderness.  Lymphadenopathy:    She has no cervical adenopathy.  Neurological: She is alert and oriented to person, place, and time. No cranial nerve deficit. She exhibits normal muscle tone. Coordination normal.  Skin: Skin is warm and dry. No rash noted. She is not diaphoretic. No erythema. No pallor.  Psychiatric: She has a normal mood and affect. Her behavior is normal. Judgment and thought content normal.          Assessment & Plan:

## 2013-07-20 NOTE — Progress Notes (Signed)
Pre visit review using our clinic review tool, if applicable. No additional management support is needed unless otherwise documented below in the visit note. 

## 2013-07-20 NOTE — Addendum Note (Signed)
Addended by: Ronaldo Miyamoto on: 07/20/2013 12:21 PM   Modules accepted: Orders

## 2013-07-20 NOTE — Assessment & Plan Note (Addendum)
General medical exam including breast exam normal today. PAP deferred as all previous normal and per pt preference. Mammogram ordered. DEXA UTD. Will check labs including CBC, CMP, lipids. Appropriate screening performed. Encouraged continued efforts at healthy diet and regular physical activity. Prevnar today.

## 2013-07-20 NOTE — Assessment & Plan Note (Signed)
BP Readings from Last 3 Encounters:  07/20/13 130/90  10/01/12 160/100  08/23/12 142/82   BP slightly elevated today, however pt reports lower at home. Will continue to monitor. If BP continues to be near 140/90, then discussed increase in Lisinopril to 10mg  daily. Follow up 6 months and prn.

## 2013-07-21 ENCOUNTER — Encounter: Payer: Medicare Other | Admitting: Internal Medicine

## 2013-07-21 LAB — VITAMIN D 25 HYDROXY (VIT D DEFICIENCY, FRACTURES): Vit D, 25-Hydroxy: 47 ng/mL (ref 30–89)

## 2013-07-21 LAB — VARICELLA ZOSTER ANTIBODY, IGG: Varicella IgG: 1255 Index — ABNORMAL HIGH (ref <135.00)

## 2013-07-26 ENCOUNTER — Encounter: Payer: Self-pay | Admitting: Emergency Medicine

## 2013-08-18 ENCOUNTER — Telehealth: Payer: Self-pay | Admitting: Internal Medicine

## 2013-08-18 ENCOUNTER — Encounter: Payer: Self-pay | Admitting: *Deleted

## 2013-08-18 ENCOUNTER — Ambulatory Visit: Payer: Self-pay | Admitting: Internal Medicine

## 2013-08-18 DIAGNOSIS — R922 Inconclusive mammogram: Secondary | ICD-10-CM | POA: Diagnosis not present

## 2013-08-18 DIAGNOSIS — Z1231 Encounter for screening mammogram for malignant neoplasm of breast: Secondary | ICD-10-CM | POA: Diagnosis not present

## 2013-08-18 LAB — HM MAMMOGRAPHY

## 2013-08-18 NOTE — Telephone Encounter (Signed)
Mammogram from 5/7 showed asymmetry in the left breast. They recommended additional views. Has this been scheduled?

## 2013-08-22 NOTE — Telephone Encounter (Signed)
Pt has not had an appt made for the additional views, she is calling them today to schedule that appt

## 2013-08-24 ENCOUNTER — Ambulatory Visit: Payer: Self-pay | Admitting: Internal Medicine

## 2013-08-24 DIAGNOSIS — N6459 Other signs and symptoms in breast: Secondary | ICD-10-CM | POA: Diagnosis not present

## 2013-08-24 DIAGNOSIS — R922 Inconclusive mammogram: Secondary | ICD-10-CM | POA: Diagnosis not present

## 2013-08-24 LAB — HM MAMMOGRAPHY

## 2013-08-25 ENCOUNTER — Encounter: Payer: Self-pay | Admitting: Internal Medicine

## 2014-01-23 ENCOUNTER — Ambulatory Visit (INDEPENDENT_AMBULATORY_CARE_PROVIDER_SITE_OTHER): Payer: Medicare Other | Admitting: Internal Medicine

## 2014-01-23 ENCOUNTER — Encounter: Payer: Self-pay | Admitting: Internal Medicine

## 2014-01-23 VITALS — BP 138/100 | HR 83 | Temp 98.0°F | Ht 60.25 in | Wt 168.5 lb

## 2014-01-23 DIAGNOSIS — I10 Essential (primary) hypertension: Secondary | ICD-10-CM

## 2014-01-23 DIAGNOSIS — Z23 Encounter for immunization: Secondary | ICD-10-CM | POA: Diagnosis not present

## 2014-01-23 MED ORDER — LISINOPRIL 10 MG PO TABS: 10.0000 mg | ORAL_TABLET | Freq: Every day | ORAL | Status: DC

## 2014-01-23 NOTE — Assessment & Plan Note (Signed)
BP Readings from Last 3 Encounters:  01/23/14 138/100  07/20/13 130/90  10/01/12 160/100   BP elevated. Will increase lisinopril to 10mg  daily. Repeat Cr and K in 1 week. Recheck BP in 4 weeks.

## 2014-01-23 NOTE — Patient Instructions (Signed)
Increase Lisinopril to 10mg  daily.  Check labs in 1 week.

## 2014-01-23 NOTE — Progress Notes (Signed)
   Subjective:    Patient ID: Katherine Stanley, female    DOB: [birth year], 68 y.o.   MRN: [id number]  HPI 68YO female presents for follow up.  HTN - BP at home 150/85. Compliant with medication. No chest pain, headache.  Generally, feeling well with no concerns today.  Review of Systems  Constitutional: Negative for fever, chills, appetite change, fatigue and unexpected weight change.  Eyes: Negative for visual disturbance.  Respiratory: Negative for shortness of breath.   Cardiovascular: Negative for chest pain, palpitations and leg swelling.  Gastrointestinal: Negative for abdominal pain.  Skin: Negative for color change and rash.  Neurological: Negative for headaches.  Hematological: Negative for adenopathy. Does not bruise/bleed easily.  Psychiatric/Behavioral: Negative for dysphoric mood. The patient is not nervous/anxious.        Objective:    BP 138/100  Pulse 83  Temp(Src) 98 F (36.7 C) (Oral)  Ht 5' 0.25" (1.53 m)  Wt 168 lb 8 oz (76.431 kg)  BMI 32.65 kg/m2  SpO2 95% Physical Exam  Constitutional: She is oriented to person, place, and time. She appears well-developed and well-nourished. No distress.  HENT:  Head: Normocephalic and atraumatic.  Right Ear: External ear normal.  Left Ear: External ear normal.  Nose: Nose normal.  Mouth/Throat: Oropharynx is clear and moist. No oropharyngeal exudate.  Eyes: Conjunctivae are normal. Pupils are equal, round, and reactive to light. Right eye exhibits no discharge. Left eye exhibits no discharge. No scleral icterus.  Neck: Normal range of motion. Neck supple. No tracheal deviation present. No thyromegaly present.  Cardiovascular: Normal rate, regular rhythm, normal heart sounds and intact distal pulses.  Exam reveals no gallop and no friction rub.   No murmur heard. Pulmonary/Chest: Effort normal and breath sounds normal. No accessory muscle usage. Not tachypneic. No respiratory distress. She has no decreased breath  sounds. She has no wheezes. She has no rhonchi. She has no rales. She exhibits no tenderness.  Musculoskeletal: Normal range of motion. She exhibits no edema and no tenderness.  Lymphadenopathy:    She has no cervical adenopathy.  Neurological: She is alert and oriented to person, place, and time. No cranial nerve deficit. She exhibits normal muscle tone. Coordination normal.  Skin: Skin is warm and dry. No rash noted. She is not diaphoretic. No erythema. No pallor.  Psychiatric: She has a normal mood and affect. Her behavior is normal. Judgment and thought content normal.          Assessment & Plan:   Problem List Items Addressed This Visit     Unprioritized   Hypertension - Primary      BP Readings from Last 3 Encounters:  01/23/14 138/100  07/20/13 130/90  10/01/12 160/100   BP elevated. Will increase lisinopril to 10mg  daily. Repeat Cr and K in 1 week. Recheck BP in 4 weeks.    Relevant Medications      lisinopril (PRINIVIL,ZESTRIL) tablet   Other Relevant Orders      Comprehensive metabolic panel       Return in about 4 weeks (around 02/20/2014) for Recheck of Blood Pressure.

## 2014-01-23 NOTE — Progress Notes (Signed)
Pre visit review using our clinic review tool, if applicable. No additional management support is needed unless otherwise documented below in the visit note. 

## 2014-02-01 ENCOUNTER — Other Ambulatory Visit (INDEPENDENT_AMBULATORY_CARE_PROVIDER_SITE_OTHER): Payer: Medicare Other

## 2014-02-01 DIAGNOSIS — I10 Essential (primary) hypertension: Secondary | ICD-10-CM

## 2014-02-01 LAB — COMPREHENSIVE METABOLIC PANEL
ALT: 21 U/L (ref 0–35)
AST: 23 U/L (ref 0–37)
Albumin: 3.5 g/dL (ref 3.5–5.2)
Alkaline Phosphatase: 58 U/L (ref 39–117)
BUN: 17 mg/dL (ref 6–23)
CALCIUM: 9.3 mg/dL (ref 8.4–10.5)
CHLORIDE: 109 meq/L (ref 96–112)
CO2: 22 mEq/L (ref 19–32)
CREATININE: 0.8 mg/dL (ref 0.4–1.2)
GFR: 77.15 mL/min (ref >60.00)
Glucose, Bld: 119 mg/dL — ABNORMAL HIGH (ref 70–99)
Potassium: 3.7 mEq/L (ref 3.5–5.1)
Sodium: 141 mEq/L (ref 135–145)
TOTAL PROTEIN: 7.3 g/dL (ref 6.0–8.3)
Total Bilirubin: 0.8 mg/dL (ref 0.2–1.2)

## 2014-02-20 ENCOUNTER — Encounter: Payer: Self-pay | Admitting: Internal Medicine

## 2014-02-20 ENCOUNTER — Ambulatory Visit (INDEPENDENT_AMBULATORY_CARE_PROVIDER_SITE_OTHER): Payer: Medicare Other | Admitting: Internal Medicine

## 2014-02-20 VITALS — BP 140/90 | HR 89 | Temp 98.3°F | Ht 60.25 in | Wt 168.5 lb

## 2014-02-20 DIAGNOSIS — I10 Essential (primary) hypertension: Secondary | ICD-10-CM | POA: Diagnosis not present

## 2014-02-20 NOTE — Assessment & Plan Note (Signed)
BP Readings from Last 3 Encounters:  02/20/14 140/90  01/23/14 138/100  07/20/13 130/90   BP continues to be slightly elevated here, but better controlled at home. She prefers not to increase dose of medication. Will have her bring home monitor in to compare to ours. Follow up 4 weeks.

## 2014-02-20 NOTE — Patient Instructions (Signed)
Bring BP monitor by to compare to our monitor.  Follow up 4 weeks.

## 2014-02-20 NOTE — Progress Notes (Signed)
Pre visit review using our clinic review tool, if applicable. No additional management support is needed unless otherwise documented below in the visit note. 

## 2014-02-20 NOTE — Progress Notes (Signed)
   Subjective:    Patient ID: Katherine Stanley, female    DOB: 1945-12-24, 68 y.o.   MRN: [id number]  HPI 67YO female presents for follow up.  At last visit, increased dose of Lisinopril to 10mg  daily. BP at home mostly 120-130s/80s. No side effects on the medication. Prefers not to increase medication at this time.  Review of Systems  Constitutional: Negative for fever, chills, appetite change, fatigue and unexpected weight change.  Eyes: Negative for visual disturbance.  Respiratory: Negative for shortness of breath.   Cardiovascular: Negative for chest pain and leg swelling.  Gastrointestinal: Negative for abdominal pain.  Skin: Negative for color change and rash.  Hematological: Negative for adenopathy. Does not bruise/bleed easily.  Psychiatric/Behavioral: Negative for dysphoric mood. The patient is not nervous/anxious.        Objective:    BP 140/90 mmHg  Pulse 89  Temp(Src) 98.3 F (36.8 C) (Oral)  Ht 5' 0.25" (1.53 m)  Wt 168 lb 8 oz (76.431 kg)  BMI 32.65 kg/m2  SpO2 97% Physical Exam  Constitutional: She is oriented to person, place, and time. She appears well-developed and well-nourished. No distress.  HENT:  Head: Normocephalic and atraumatic.  Right Ear: External ear normal.  Left Ear: External ear normal.  Nose: Nose normal.  Mouth/Throat: Oropharynx is clear and moist. No oropharyngeal exudate.  Eyes: Conjunctivae are normal. Pupils are equal, round, and reactive to light. Right eye exhibits no discharge. Left eye exhibits no discharge. No scleral icterus.  Neck: Normal range of motion. Neck supple. No tracheal deviation present. No thyromegaly present.  Cardiovascular: Normal rate, regular rhythm, normal heart sounds and intact distal pulses.  Exam reveals no gallop and no friction rub.   No murmur heard. Pulmonary/Chest: Effort normal and breath sounds normal. No accessory muscle usage. No tachypnea. No respiratory distress. She has no decreased breath  sounds. She has no wheezes. She has no rhonchi. She has no rales. She exhibits no tenderness.  Musculoskeletal: Normal range of motion. She exhibits no edema or tenderness.  Lymphadenopathy:    She has no cervical adenopathy.  Neurological: She is alert and oriented to person, place, and time. No cranial nerve deficit. She exhibits normal muscle tone. Coordination normal.  Skin: Skin is warm and dry. No rash noted. She is not diaphoretic. No erythema. No pallor.  Psychiatric: She has a normal mood and affect. Her behavior is normal. Judgment and thought content normal.          Assessment & Plan:   Problem List Items Addressed This Visit      Unprioritized   Hypertension - Primary    BP Readings from Last 3 Encounters:  02/20/14 140/90  01/23/14 138/100  07/20/13 130/90   BP continues to be slightly elevated here, but better controlled at home. She prefers not to increase dose of medication. Will have her bring home monitor in to compare to ours. Follow up 4 weeks.        Return in about 4 weeks (around 03/20/2014) for Recheck of Blood Pressure.

## 2014-04-26 ENCOUNTER — Ambulatory Visit (INDEPENDENT_AMBULATORY_CARE_PROVIDER_SITE_OTHER): Payer: Medicare Other

## 2014-04-26 ENCOUNTER — Encounter: Payer: Self-pay | Admitting: Podiatry

## 2014-04-26 ENCOUNTER — Ambulatory Visit (INDEPENDENT_AMBULATORY_CARE_PROVIDER_SITE_OTHER): Payer: Medicare Other | Admitting: Podiatry

## 2014-04-26 VITALS — BP 143/83 | HR 80 | Resp 16 | Ht 60.0 in | Wt 162.0 lb

## 2014-04-26 DIAGNOSIS — G5762 Lesion of plantar nerve, left lower limb: Secondary | ICD-10-CM

## 2014-04-26 DIAGNOSIS — M779 Enthesopathy, unspecified: Secondary | ICD-10-CM | POA: Diagnosis not present

## 2014-04-26 NOTE — Progress Notes (Signed)
   Subjective:    Patient ID: Katherine Stanley, female    DOB: 1945-05-13, 69 y.o.   MRN: [id number]  HPI Comments: i have pain in my 4th toe in my left foot and pain under the toe. This has been going on for 6 months. Sometimes its worse than other times. It hurts to walk, stand and sit. i take tylenol and advil. Shoes will bother me.   Foot Pain      Review of Systems  HENT: Positive for sinus pressure.   All other systems reviewed and are negative.      Objective:   Physical Exam: I have reviewed her past medical history medications allergies surgery social history and review of systems. Pulses are strongly palpable bilateral. Neurologic sensorium is intact per Semmes-Weinstein monofilament. Deep tendon reflexes are intact. Muscle strength +5 over 5 dorsiflexion and plantar flexors and inverters and everters all intrinsic the musculature is intact. Orthopedic evaluation his result joints distal to the ankle for range of motion without crepitus. She has pain on palpation to the third interdigital space of the left foot with a palpable Mulder's click. Radiographic evaluation does not demonstrate any type of osseus abnormality in this general vicinity.        Assessment & Plan:  Assessment: Neuroma third interdigital space left foot.  Plan: Injected dexamethasone Kenalog and local anesthetic left foot. We'll follow-up with her as needed. We discussed appropriate shoe gear stretching exercises and ice therapy.

## 2014-05-15 DIAGNOSIS — M7551 Bursitis of right shoulder: Secondary | ICD-10-CM | POA: Diagnosis not present

## 2014-05-26 DIAGNOSIS — L821 Other seborrheic keratosis: Secondary | ICD-10-CM | POA: Diagnosis not present

## 2014-05-26 DIAGNOSIS — L814 Other melanin hyperpigmentation: Secondary | ICD-10-CM | POA: Diagnosis not present

## 2014-05-26 DIAGNOSIS — D229 Melanocytic nevi, unspecified: Secondary | ICD-10-CM | POA: Diagnosis not present

## 2014-05-26 DIAGNOSIS — D1800 Hemangioma unspecified site: Secondary | ICD-10-CM | POA: Diagnosis not present

## 2014-05-26 DIAGNOSIS — L859 Epidermal thickening, unspecified: Secondary | ICD-10-CM | POA: Diagnosis not present

## 2014-05-26 DIAGNOSIS — Z1283 Encounter for screening for malignant neoplasm of skin: Secondary | ICD-10-CM | POA: Diagnosis not present

## 2014-05-26 DIAGNOSIS — L720 Epidermal cyst: Secondary | ICD-10-CM | POA: Diagnosis not present

## 2014-05-26 DIAGNOSIS — L820 Inflamed seborrheic keratosis: Secondary | ICD-10-CM | POA: Diagnosis not present

## 2014-05-26 DIAGNOSIS — L578 Other skin changes due to chronic exposure to nonionizing radiation: Secondary | ICD-10-CM | POA: Diagnosis not present

## 2014-06-16 ENCOUNTER — Encounter: Payer: Self-pay | Admitting: Podiatry

## 2014-06-29 ENCOUNTER — Ambulatory Visit (INDEPENDENT_AMBULATORY_CARE_PROVIDER_SITE_OTHER): Payer: Medicare Other | Admitting: Nurse Practitioner

## 2014-06-29 ENCOUNTER — Encounter: Payer: Self-pay | Admitting: Nurse Practitioner

## 2014-06-29 ENCOUNTER — Telehealth: Payer: Self-pay | Admitting: *Deleted

## 2014-06-29 ENCOUNTER — Telehealth: Payer: Self-pay | Admitting: Internal Medicine

## 2014-06-29 VITALS — BP 130/84 | HR 117 | Temp 97.5°F | Resp 14 | Ht 60.25 in | Wt 161.0 lb

## 2014-06-29 DIAGNOSIS — R197 Diarrhea, unspecified: Secondary | ICD-10-CM | POA: Diagnosis not present

## 2014-06-29 LAB — COMPREHENSIVE METABOLIC PANEL
ALT: 30 U/L (ref 0–35)
AST: 29 U/L (ref 0–37)
Albumin: 4.1 g/dL (ref 3.5–5.2)
Alkaline Phosphatase: 58 U/L (ref 39–117)
BILIRUBIN TOTAL: 0.5 mg/dL (ref 0.2–1.2)
BUN: 52 mg/dL — ABNORMAL HIGH (ref 6–23)
CO2: 21 meq/L (ref 19–32)
Calcium: 9.5 mg/dL (ref 8.4–10.5)
Chloride: 99 mEq/L (ref 96–112)
Creatinine, Ser: 1.34 mg/dL — ABNORMAL HIGH (ref 0.40–1.20)
GFR: 41.88 mL/min — ABNORMAL LOW (ref >60.00)
GLUCOSE: 145 mg/dL — AB (ref 70–99)
Potassium: 3.5 mEq/L (ref 3.5–5.1)
Sodium: 132 mEq/L — ABNORMAL LOW (ref 135–145)
Total Protein: 7.5 g/dL (ref 6.0–8.3)

## 2014-06-29 LAB — CBC WITH DIFFERENTIAL/PLATELET
BASOS ABS: 0 10*3/uL (ref 0.0–0.1)
BASOS PCT: 0.2 % (ref 0.0–3.0)
EOS ABS: 0 10*3/uL (ref 0.0–0.7)
Eosinophils Relative: 0 % (ref 0.0–5.0)
HEMATOCRIT: 45.1 % (ref 36.0–46.0)
HEMOGLOBIN: 15.8 g/dL — AB (ref 12.0–15.0)
Lymphocytes Relative: 12.8 % (ref 12.0–46.0)
Lymphs Abs: 2.4 10*3/uL (ref 0.7–4.0)
MCHC: 34.9 g/dL (ref 30.0–36.0)
MCV: 87.8 fl (ref 78.0–100.0)
MONO ABS: 1.1 10*3/uL — AB (ref 0.1–1.0)
MONOS PCT: 6.2 % (ref 3.0–12.0)
Neutro Abs: 14.9 10*3/uL — ABNORMAL HIGH (ref 1.4–7.7)
Neutrophils Relative %: 80.8 % — ABNORMAL HIGH (ref 43.0–77.0)
PLATELETS: 353 10*3/uL (ref 150.0–400.0)
RBC: 5.13 Mil/uL — AB (ref 3.87–5.11)
RDW: 12.3 % (ref 11.5–15.5)

## 2014-06-29 MED ORDER — CIPROFLOXACIN HCL 500 MG PO TABS: 500.0000 mg | ORAL_TABLET | Freq: Two times a day (BID) | ORAL | Status: DC

## 2014-06-29 NOTE — Telephone Encounter (Signed)
CRITICAL   WBC 18.5

## 2014-06-29 NOTE — Telephone Encounter (Signed)
Seen by Lorane Gell. Started on Antibiotics for diarrhea.

## 2014-06-29 NOTE — Telephone Encounter (Signed)
FYI

## 2014-06-29 NOTE — Telephone Encounter (Signed)
The patient was sent to [facility] to triage the call. She has had Diarrhea x4 days.

## 2014-06-29 NOTE — Progress Notes (Signed)
Subjective:    Patient ID: Katherine Stanley, female    DOB: 05-14-1945, 69 y.o.   MRN: [id number]  HPI  Katherine Stanley is a 69 yo female with a  CC of diarrhea x 4 days.    Monday morning threw up one time.  Diarrhea ever since then   Going every 15 min she reports, more than 6 x a day   Yellowish stool she reports, no blood, clear, watery  Gatorade, water, no milk  No BM in 2-3 hours  Pepto- yesterday helpful  Cold- Nyquil  Probiotic daily   Review of Systems  Constitutional: Negative for fever, chills, diaphoresis and fatigue.  Respiratory: Negative for chest tightness, shortness of breath and wheezing.   Cardiovascular: Negative for chest pain, palpitations and leg swelling.  Gastrointestinal: Positive for diarrhea. Negative for nausea, vomiting, abdominal pain, blood in stool, abdominal distention and rectal pain.  Skin: Negative for rash.  Neurological: Negative for dizziness, weakness, numbness and headaches.  Psychiatric/Behavioral: The patient is not nervous/anxious.    Past Medical History  Diagnosis Date  . DUB (dysfunctional uterine bleeding)   . Rosacea     History   Social History  . Marital Status: Married    Spouse Name: N/A  . Number of Children: N/A  . Years of Education: N/A   Occupational History  . Not on file.   Social History Main Topics  . Smoking status: Never Smoker   . Smokeless tobacco: Not on file  . Alcohol Use: No  . Drug Use: No  . Sexual Activity: Yes    Birth Control/ Protection: Post-menopausal, Surgical   Other Topics Concern  . Not on file   Social History Narrative   Lives in [place] with husband. No children. No pets.      Work - retired Sales promotion account executive   Diet - regular   Exercise - golf, gardening    Past Surgical History  Procedure Laterality Date  . Vaginal hysterectomy  1982  . Tonsillectomy    . Cholecystectomy    . Refractive surgery    . Cholecystectomy      Family History  Problem Relation  Age of Onset  . Hypertension Mother   . Heart disease Mother   . Kidney disease Mother   . Diabetes Father   . Hypertension Father   . Heart disease Father   . Heart disease Brother   . Cancer Maternal Aunt     Allergies  Allergen Reactions  . Alendronate     Diffuse myalgia and arthralgia    Current Outpatient Prescriptions on File Prior to Visit  Medication Sig Dispense Refill  . Acetaminophen (TYLENOL 8 HOUR PO) Take by mouth.    . Ascorbic Acid (VITAMIN C PO) Take by mouth.    Marland Kitchen b complex vitamins tablet Take 1 tablet by mouth daily.    . Cholecalciferol (VITAMIN D PO) Take by mouth.    . folic acid (FOLVITE) 163 MCG tablet Take 400 mcg by mouth daily.    . Garlic 10 MG CAPS Take 1 tablet by mouth daily.    . Glucosamine-Chondroit-Vit C-Mn (GLUCOSAMINE CHONDR 500 COMPLEX PO) Take 500 mg by mouth daily.    . Ibuprofen (ADVIL) 200 MG CAPS Take by mouth as needed.    Marland Kitchen lisinopril (PRINIVIL,ZESTRIL) 10 MG tablet Take 1 tablet (10 mg total) by mouth daily. TAKE ONE TABLET BY MOUTH EVERY DAY 90 tablet 1  . LYSINE PO Take by mouth.    Marland Kitchen  Probiotic Product (PROBIOTIC DAILY PO) Take by mouth daily.    Marland Kitchen VITAMIN E PO Take by mouth.     No current facility-administered medications on file prior to visit.      Objective:   Physical Exam  Constitutional: She is oriented to person, place, and time. She appears well-developed and well-nourished. No distress.  BP 130/84 mmHg  Pulse 117  Temp(Src) 97.5 F (36.4 C) (Oral)  Resp 14  Ht 5' 0.25" (1.53 m)  Wt 161 lb (73.029 kg)  BMI 31.20 kg/m2  SpO2 97%   HENT:  Head: Normocephalic and atraumatic.  Right Ear: External ear normal.  Left Ear: External ear normal.  Eyes: Right eye exhibits no discharge. Left eye exhibits no discharge. No scleral icterus.  Cardiovascular: Normal rate, regular rhythm, normal heart sounds and intact distal pulses.  Exam reveals no gallop and no friction rub.   No murmur heard. Pulmonary/Chest: Effort  normal and breath sounds normal. No respiratory distress. She has no wheezes. She has no rales. She exhibits no tenderness.  Abdominal: Soft. Bowel sounds are normal. She exhibits no distension and no mass. There is no tenderness. There is no rebound and no guarding.  Neurological: She is alert and oriented to person, place, and time. No cranial nerve deficit. She exhibits normal muscle tone. Coordination normal.  Skin: Skin is warm and dry. No rash noted. She is not diaphoretic.  Psychiatric: She has a normal mood and affect. Her behavior is normal. Judgment and thought content normal.      Assessment & Plan:

## 2014-06-29 NOTE — Progress Notes (Signed)
Pre visit review using our clinic review tool, if applicable. No additional management support is needed unless otherwise documented below in the visit note. 

## 2014-06-29 NOTE — Patient Instructions (Signed)
Rotavirus Infection Rotaviruses are a group of viruses that cause acute stomach and bowel upset (gastroenteritis) in all ages. Rotavirus infection may also be called infantile diarrhea, winter diarrhea, acute nonbacterial infectious gastroenteritis, and acute viral gastroenteritis. It occurs especially in young children. Children 6 months to 68 years of age, premature infants, the elderly, and the immunocompromised are more likely to have severe symptoms.  CAUSES  Rotaviruses are transmitted by the fecal-oral route. This means the virus is spread by eating or drinking food or water that is contaminated with infected stool. The virus is most commonly spread from person to person when someone's hands are contaminated with infected stool. For example, infected food handlers may contaminate foods. This can occur with foods that require handling and no further cooking, such as salads, fruits, and hors d'oeuvres. Rotaviruses are quite stable. They can be hard to control and eliminate in water supplies. Rotaviruses are a common cause of infection and diarrhea in child-care settings. SYMPTOMS  Some children have no symptoms. The period after infection but before symptoms begin (incubation period) ranges from 1 to 3 days. Symptoms usually begin with vomiting. Diarrhea follows for 4 to 8 days. Other symptoms may include:  Low-grade fever.  Temporary dairy (lactose) intolerance.  Cough.  Runny nose. DIAGNOSIS  The disease is diagnosed by identifying the virus in the stool. A person with rotavirus diarrhea often has large numbers of viruses in his or her stool. TREATMENT  There is no cure for rotavirus infection. Most people develop an immune response that eventually gets rid of the virus. While this natural response develops, the virus can make you very ill. The majority of people affected are young infants, so the disease can be dangerous. The most common symptom is diarrhea. Diarrhea alone can cause severe  dehydration. It can also cause an electrolyte imbalance. Treatments are aimed at rehydration. Rehydration treatment can prevent the severe effects of dehydration. Antidiarrheal medicines are not recommended. Such medicines may prolong the infection, since they prevent you from passing the viruses out of your body. Severe diarrhea without fluid and electrolyte replacement may be life threatening. HOME CARE INSTRUCTIONS Ask your health care provider for specific rehydration instructions. SEEK IMMEDIATE MEDICAL CARE IF:   There is decreased urination.  You have a dry mouth, tongue, or lips.  You notice decreased tears or sunken eyes.  You have dry skin.  Your breathing is fast.  Your fingertip takes more than 2 seconds to turn pink again after a gentle squeeze.  There is blood in your vomit or stool.  Your abdomen is enlarged (distended) or very tender.  There is persistent vomiting. Most of this information is courtesy of the Center for Disease Control and Prevention of Food Illness Fact Sheet. Document Released: 03/31/2005 Document Revised: 08/15/2013 Document Reviewed: 06/27/2010 [facility] Patient Information 2015 [place], [place]. This information is not intended to replace advice given to you by your health care provider. Make sure you discuss any questions you have with your health care provider.

## 2014-06-29 NOTE — Telephone Encounter (Signed)
Patient Name: Katherine Stanley DOB: [birth year] Initial Comment caller states she has diarrhea Nurse Assessment Nurse: Ronnald Ramp, RN, Miranda Date/Time (Eastern Time): 06/29/2014 10:09:57 AM Confirm and document reason for call. If symptomatic, describe symptoms. ---Caller states she has been having diarrhea for the last 4 days. Vomited x 1 on Sunday night. Has the patient traveled out of the country within the last 30 days? ---No Does the patient require triage? ---Yes Related visit to physician within the last 2 weeks? ---No Does the PT have any chronic conditions? (i.e. diabetes, asthma, etc.) ---Yes List chronic conditions. ---HTN Guidelines Guideline Title Affirmed Question Affirmed Notes Diarrhea [1] Age > 60 years AND [2] > 6 diarrhea stools in past 24 hours Final Disposition User See Physician within 4 Hours (or PCP triage) Ronnald Ramp, RN, Miranda Comments Appt scheduled for 11am with Lorane Gell

## 2014-07-05 DIAGNOSIS — R197 Diarrhea, unspecified: Secondary | ICD-10-CM | POA: Insufficient documentation

## 2014-07-05 NOTE — Assessment & Plan Note (Signed)
Will obtain CMET, CBC w/ diff today. Improved today due to use of pepto. If still complaining of diarrhea will get stool studies (pt felt unsure about getting them). Will follow.

## 2014-07-14 ENCOUNTER — Ambulatory Visit: Payer: Self-pay

## 2014-07-20 ENCOUNTER — Other Ambulatory Visit: Payer: Self-pay | Admitting: Orthopaedic Surgery

## 2014-07-20 DIAGNOSIS — M25511 Pain in right shoulder: Secondary | ICD-10-CM

## 2014-07-24 ENCOUNTER — Ambulatory Visit (INDEPENDENT_AMBULATORY_CARE_PROVIDER_SITE_OTHER): Payer: Medicare Other | Admitting: Internal Medicine

## 2014-07-24 ENCOUNTER — Encounter: Payer: Self-pay | Admitting: Internal Medicine

## 2014-07-24 VITALS — BP 157/89 | HR 83 | Temp 98.3°F | Ht 60.25 in | Wt 167.4 lb

## 2014-07-24 DIAGNOSIS — Z1239 Encounter for other screening for malignant neoplasm of breast: Secondary | ICD-10-CM

## 2014-07-24 DIAGNOSIS — Z23 Encounter for immunization: Secondary | ICD-10-CM

## 2014-07-24 DIAGNOSIS — Z0000 Encounter for general adult medical examination without abnormal findings: Secondary | ICD-10-CM

## 2014-07-24 DIAGNOSIS — M8580 Other specified disorders of bone density and structure, unspecified site: Secondary | ICD-10-CM | POA: Diagnosis not present

## 2014-07-24 DIAGNOSIS — I10 Essential (primary) hypertension: Secondary | ICD-10-CM

## 2014-07-24 DIAGNOSIS — Z79899 Other long term (current) drug therapy: Secondary | ICD-10-CM

## 2014-07-24 LAB — COMPREHENSIVE METABOLIC PANEL
ALT: 16 U/L (ref 0–35)
AST: 16 U/L (ref 0–37)
Albumin: 4 g/dL (ref 3.5–5.2)
Alkaline Phosphatase: 57 U/L (ref 39–117)
BUN: 13 mg/dL (ref 6–23)
CALCIUM: 9.7 mg/dL (ref 8.4–10.5)
CHLORIDE: 106 meq/L (ref 96–112)
CO2: 24 meq/L (ref 19–32)
Creatinine, Ser: 0.6 mg/dL (ref 0.40–1.20)
GFR: 105.83 mL/min (ref >60.00)
Glucose, Bld: 107 mg/dL — ABNORMAL HIGH (ref 70–99)
Potassium: 4 mEq/L (ref 3.5–5.1)
SODIUM: 138 meq/L (ref 135–145)
Total Bilirubin: 0.8 mg/dL (ref 0.2–1.2)
Total Protein: 7.2 g/dL (ref 6.0–8.3)

## 2014-07-24 LAB — CBC WITH DIFFERENTIAL/PLATELET
BASOS ABS: 0 10*3/uL (ref 0.0–0.1)
Basophils Relative: 0.6 % (ref 0.0–3.0)
EOS PCT: 3.4 % (ref 0.0–5.0)
Eosinophils Absolute: 0.2 10*3/uL (ref 0.0–0.7)
HEMATOCRIT: 40.7 % (ref 36.0–46.0)
Hemoglobin: 14 g/dL (ref 12.0–15.0)
LYMPHS ABS: 2.4 10*3/uL (ref 0.7–4.0)
LYMPHS PCT: 35.1 % (ref 12.0–46.0)
MCHC: 34.3 g/dL (ref 30.0–36.0)
MCV: 89.3 fl (ref 78.0–100.0)
Monocytes Absolute: 0.6 10*3/uL (ref 0.1–1.0)
Monocytes Relative: 8.4 % (ref 3.0–12.0)
NEUTROS PCT: 52.5 % (ref 43.0–77.0)
Neutro Abs: 3.5 10*3/uL (ref 1.4–7.7)
PLATELETS: 249 10*3/uL (ref 150.0–400.0)
RBC: 4.55 Mil/uL (ref 3.87–5.11)
RDW: 13 % (ref 11.5–15.5)
WBC: 6.7 10*3/uL (ref 4.0–10.5)

## 2014-07-24 LAB — MICROALBUMIN / CREATININE URINE RATIO
Creatinine,U: 40.5 mg/dL
Microalb Creat Ratio: 1.7 mg/g (ref 0.0–30.0)
Microalb, Ur: <0.7 mg/dL (ref 0.0–1.9)

## 2014-07-24 LAB — VITAMIN D 25 HYDROXY (VIT D DEFICIENCY, FRACTURES): VITD: 33.58 ng/mL (ref 30.00–100.00)

## 2014-07-24 LAB — LIPID PANEL
CHOL/HDL RATIO: 3
Cholesterol: 154 mg/dL (ref 0–200)
HDL: 45 mg/dL (ref >39.00)
LDL Cholesterol: 76 mg/dL (ref 0–99)
NONHDL: 109
Triglycerides: 167 mg/dL — ABNORMAL HIGH (ref 0.0–149.0)
VLDL: 33.4 mg/dL (ref 0.0–40.0)

## 2014-07-24 MED ORDER — LISINOPRIL 10 MG PO TABS: 10.0000 mg | ORAL_TABLET | Freq: Every day | ORAL | Status: DC

## 2014-07-24 NOTE — Assessment & Plan Note (Signed)
General medical exam including breast exam normal today. PAP deferred as all previous normal and per pt preference. Mammogram ordered. DEXA UTD. Will check labs including CBC, CMP, lipids. Appropriate screening performed. Encouraged continued efforts at healthy diet and regular physical activity. Pneumovax today.  Patient was given a handout regarding current recommendations for health maintenance and preventative care on the AVS.

## 2014-07-24 NOTE — Patient Instructions (Signed)

## 2014-07-24 NOTE — Addendum Note (Signed)
Addended by: Vernetta Honey on: 07/24/2014 09:21 AM   Modules accepted: Orders

## 2014-07-24 NOTE — Progress Notes (Signed)
Pre visit review using our clinic review tool, if applicable. No additional management support is needed unless otherwise documented below in the visit note. 

## 2014-07-24 NOTE — Progress Notes (Signed)
Subjective:    Patient ID: Katherine Stanley, female    DOB: 12/23/1945, 69 y.o.   MRN: [id number]  HPI  The patient is here for annual Medicare wellness examination and management of other chronic and acute problems.   The risk factors are reflected in the social history.  The roster of all physicians providing medical care to patient - is listed in the Snapshot section of the chart.  Activities of daily living:  The patient is 100% independent in all ADLs: dressing, toileting, feeding as well as independent mobility. Lives with husband. No pets. Lives in 3 story house. Has hardwood floors.  Home safety : The patient has smoke detectors in the home. They wear seatbelts.  There are no firearms at home. There is no violence in the home.   There is no risks for hepatitis, STDs or HIV. There is no history of blood transfusion. They have no travel history to infectious disease endemic areas of the world.  The patient has seen their dentist in the last six month. (Dr. Altha Harm) They have seen their eye doctor in the last year. (Dr. Steffanie Rainwater, not seen since [place]) No issues with hearing.  They have deferred audiologic testing in the last year.   They do not  have excessive sun exposure. Discussed the need for sun protection: hats, long sleeves and use of sunscreen if there is significant sun exposure. (Dermatologist - Dr. Rochele Pages) Ortho - Dr. Durward Fortes. S/p recent right knee steroid injections.  Diet: the importance of a healthy diet is discussed. They do have a healthy diet.  The benefits of regular aerobic exercise were discussed. She plays golf regularly. Also walking and doing stretches.  Depression screen: there are no signs or vegative symptoms of depression- irritability, change in appetite, anhedonia, sadness/tearfullness.  Cognitive assessment: the patient manages all their financial and personal affairs and is actively engaged. They could relate day,date,year and events.  HCPOA -  [employer], husband.  The following portions of the patient's history were reviewed and updated as appropriate: allergies, current medications, past family history, past medical history,  past surgical history, past social history  and problem list.  Visual acuity was not assessed per patient preference since she has regular follow up with her ophthalmologist. Hearing and body mass index were assessed and reviewed.   During the course of the visit the patient was educated and counseled about appropriate screening and preventive services including : fall prevention , diabetes screening, nutrition counseling, colorectal cancer screening, and recommended immunizations.    HTN - BP was low at home during recent diarrheal illness. Typically 135/70s mostly. Compliant with medications.   BP Readings from Last 3 Encounters:  07/24/14 157/89  06/29/14 130/84  04/26/14 143/83    Past medical, surgical, family and social history per today's encounter.  Review of Systems  Constitutional: Negative for fever, chills, appetite change, fatigue and unexpected weight change.  Eyes: Negative for visual disturbance.  Respiratory: Negative for shortness of breath.   Cardiovascular: Negative for chest pain and leg swelling.  Gastrointestinal: Negative for nausea, vomiting, abdominal pain, diarrhea and constipation.  Musculoskeletal: Negative for myalgias and arthralgias.  Skin: Negative for color change and rash.  Hematological: Negative for adenopathy. Does not bruise/bleed easily.  Psychiatric/Behavioral: Negative for suicidal ideas, sleep disturbance and dysphoric mood. The patient is not nervous/anxious.        Objective:    BP 157/89 mmHg  Pulse 83  Temp(Src) 98.3 F (36.8 C) (Oral)  Ht 5'  0.25" (1.53 m)  Wt 167 lb 6 oz (75.921 kg)  BMI 32.43 kg/m2  SpO2 94% Physical Exam  Constitutional: She is oriented to person, place, and time. She appears well-developed and well-nourished. No distress.    HENT:  Head: Normocephalic and atraumatic.  Right Ear: External ear normal.  Left Ear: External ear normal.  Nose: Nose normal.  Mouth/Throat: Oropharynx is clear and moist. No oropharyngeal exudate.  Eyes: Conjunctivae are normal. Pupils are equal, round, and reactive to light. Right eye exhibits no discharge. Left eye exhibits no discharge. No scleral icterus.  Neck: Normal range of motion. Neck supple. No tracheal deviation present. No thyromegaly present.  Cardiovascular: Normal rate, regular rhythm, normal heart sounds and intact distal pulses.  Exam reveals no gallop and no friction rub.   No murmur heard. Pulmonary/Chest: Effort normal and breath sounds normal. No accessory muscle usage. No tachypnea. No respiratory distress. She has no decreased breath sounds. She has no wheezes. She has no rales. She exhibits no tenderness. Right breast exhibits no inverted nipple, no mass, no nipple discharge, no skin change and no tenderness. Left breast exhibits no inverted nipple, no mass, no nipple discharge, no skin change and no tenderness. Breasts are symmetrical.  Abdominal: Soft. Bowel sounds are normal. She exhibits no distension and no mass. There is no tenderness. There is no rebound and no guarding.  Musculoskeletal: Normal range of motion. She exhibits no edema or tenderness.  Lymphadenopathy:    She has no cervical adenopathy.  Neurological: She is alert and oriented to person, place, and time. No cranial nerve deficit. She exhibits normal muscle tone. Coordination normal.  Skin: Skin is warm and dry. No rash noted. She is not diaphoretic. No erythema. No pallor.  Psychiatric: She has a normal mood and affect. Her behavior is normal. Judgment and thought content normal.          Assessment & Plan:   Problem List Items Addressed This Visit      Unprioritized   Hypertension    BP Readings from Last 3 Encounters:  07/24/14 157/89  06/29/14 130/84  04/26/14 143/83   BP  elevated today, but has been well controlled at home, even low recently with diarrhea. Will have her continue Lisinopril 10mg  daily. Renal function with labs. Follow up in 4 weeks to recheck BP.      Relevant Medications   lisinopril (PRINIVIL,ZESTRIL) tablet   Medicare annual wellness visit, subsequent - Primary    General medical exam including breast exam normal today. PAP deferred as all previous normal and per pt preference. Mammogram ordered. DEXA UTD. Will check labs including CBC, CMP, lipids. Appropriate screening performed. Encouraged continued efforts at healthy diet and regular physical activity. Pneumovax today.  Patient was given a handout regarding current recommendations for health maintenance and preventative care on the AVS.            Relevant Orders   CBC with Differential/Platelet   Comprehensive metabolic panel   Lipid panel   Microalbumin / creatinine urine ratio   Vit D  25 hydroxy (rtn osteoporosis monitoring)   Screening for breast cancer   Relevant Orders   MM Digital Screening       Return in about 4 weeks (around 08/21/2014) for Recheck of Blood Pressure.

## 2014-07-24 NOTE — Assessment & Plan Note (Signed)
BP Readings from Last 3 Encounters:  07/24/14 157/89  06/29/14 130/84  04/26/14 143/83   BP elevated today, but has been well controlled at home, even low recently with diarrhea. Will have her continue Lisinopril 10mg  daily. Renal function with labs. Follow up in 4 weeks to recheck BP.

## 2014-08-01 ENCOUNTER — Encounter: Payer: Self-pay | Admitting: Internal Medicine

## 2014-08-14 ENCOUNTER — Ambulatory Visit
Admission: RE | Admit: 2014-08-14 | Discharge: 2014-08-14 | Disposition: A | Discharge status: Home / Self Care | Payer: Medicare Other | Source: Ambulatory Visit | Attending: Orthopaedic Surgery | Admitting: Orthopaedic Surgery

## 2014-08-14 DIAGNOSIS — M75121 Complete rotator cuff tear or rupture of right shoulder, not specified as traumatic: Secondary | ICD-10-CM | POA: Diagnosis not present

## 2014-08-14 DIAGNOSIS — M25511 Pain in right shoulder: Secondary | ICD-10-CM

## 2014-08-14 DIAGNOSIS — M7581 Other shoulder lesions, right shoulder: Secondary | ICD-10-CM | POA: Diagnosis not present

## 2014-08-21 DIAGNOSIS — M25211 Flail joint, right shoulder: Secondary | ICD-10-CM | POA: Diagnosis not present

## 2014-08-21 DIAGNOSIS — M7551 Bursitis of right shoulder: Secondary | ICD-10-CM | POA: Diagnosis not present

## 2014-08-21 DIAGNOSIS — M25121 Fistula, right elbow: Secondary | ICD-10-CM | POA: Diagnosis not present

## 2014-08-21 DIAGNOSIS — M25511 Pain in right shoulder: Secondary | ICD-10-CM | POA: Diagnosis not present

## 2014-08-28 ENCOUNTER — Encounter: Payer: Self-pay | Admitting: Internal Medicine

## 2014-08-28 ENCOUNTER — Ambulatory Visit (INDEPENDENT_AMBULATORY_CARE_PROVIDER_SITE_OTHER): Payer: Medicare Other | Admitting: Internal Medicine

## 2014-08-28 VITALS — BP 136/86 | HR 96 | Temp 98.0°F | Ht 60.25 in | Wt 169.0 lb

## 2014-08-28 DIAGNOSIS — L821 Other seborrheic keratosis: Secondary | ICD-10-CM | POA: Diagnosis not present

## 2014-08-28 DIAGNOSIS — I10 Essential (primary) hypertension: Secondary | ICD-10-CM

## 2014-08-28 DIAGNOSIS — M75101 Unspecified rotator cuff tear or rupture of right shoulder, not specified as traumatic: Secondary | ICD-10-CM

## 2014-08-28 MED ORDER — LISINOPRIL 10 MG PO TABS: 10.0000 mg | ORAL_TABLET | Freq: Every day | ORAL | Status: DC

## 2014-08-28 NOTE — Patient Instructions (Signed)
Continue current medications.  Follow up in 6 months or sooner as needed.

## 2014-08-28 NOTE — Assessment & Plan Note (Signed)
Scheduled for surgical repair next month. Low risk for perioperative cardiac complications. Recommend proceed with surgery.

## 2014-08-28 NOTE — Assessment & Plan Note (Signed)
Skin lesions most c/w SK. Discussed benign nature of these lesions. Recommended observation for now. Follow up as scheduled with dermatology.

## 2014-08-28 NOTE — Progress Notes (Signed)
Subjective:    Patient ID: Katherine Stanley, female    DOB: 1945/08/13, 69 y.o.   MRN: [id number]  HPI  69YO female presents for follow up.  Last seen for Wellness Visit 07/24/2014. BP was noted to be elevated at that visit.  Planning to have surgery to repair right rotator cuff in June.  HTN - BP at home typically 120s/70s. Compliant with medications. No CP, HA.  BP Readings from Last 3 Encounters:  08/28/14 136/86  07/24/14 157/89  06/29/14 130/84   Concerned about a few tan colored skin lesions on her left breast. Unsure how long these have been present. Not painful. Not changing.  Past medical, surgical, family and social history per today's encounter.  Review of Systems  Constitutional: Negative for fever, chills, appetite change, fatigue and unexpected weight change.  Eyes: Negative for visual disturbance.  Respiratory: Negative for cough, chest tightness and shortness of breath.   Cardiovascular: Negative for chest pain, palpitations and leg swelling.  Gastrointestinal: Negative for abdominal pain.  Skin: Positive for color change. Negative for rash.  Hematological: Negative for adenopathy. Does not bruise/bleed easily.  Psychiatric/Behavioral: Negative for dysphoric mood. The patient is not nervous/anxious.        Objective:    BP 136/86 mmHg  Pulse 96  Temp(Src) 98 F (36.7 C) (Oral)  Ht 5' 0.25" (1.53 m)  Wt 169 lb (76.658 kg)  BMI 32.75 kg/m2  SpO2 95% Physical Exam  Constitutional: She is oriented to person, place, and time. She appears well-developed and well-nourished. No distress.  HENT:  Head: Normocephalic and atraumatic.  Right Ear: External ear normal.  Left Ear: External ear normal.  Nose: Nose normal.  Mouth/Throat: Oropharynx is clear and moist. No oropharyngeal exudate.  Eyes: Conjunctivae are normal. Pupils are equal, round, and reactive to light. Right eye exhibits no discharge. Left eye exhibits no discharge. No scleral icterus.  Neck:  Normal range of motion. Neck supple. No tracheal deviation present. No thyromegaly present.  Cardiovascular: Normal rate, regular rhythm, normal heart sounds and intact distal pulses.  Exam reveals no gallop and no friction rub.   No murmur heard. Pulmonary/Chest: Effort normal and breath sounds normal. No respiratory distress. She has no wheezes. She has no rales. She exhibits no tenderness.  Musculoskeletal: Normal range of motion. She exhibits no edema or tenderness.  Lymphadenopathy:    She has no cervical adenopathy.  Neurological: She is alert and oriented to person, place, and time. No cranial nerve deficit. She exhibits normal muscle tone. Coordination normal.  Skin: Skin is warm and dry. No rash noted. She is not diaphoretic. No erythema. No pallor.     Psychiatric: She has a normal mood and affect. Her behavior is normal. Judgment and thought content normal.          Assessment & Plan:   Problem List Items Addressed This Visit      Unprioritized   Hypertension - Primary    BP Readings from Last 3 Encounters:  08/28/14 136/86  07/24/14 157/89  06/29/14 130/84   BP improved. Continue Lisinopril. Follow up in 6 months and prn.      Relevant Medications   lisinopril (PRINIVIL,ZESTRIL) 10 MG tablet   Right rotator cuff tear    Scheduled for surgical repair next month. Low risk for perioperative cardiac complications. Recommend proceed with surgery.      Seborrheic keratoses    Skin lesions most c/w SK. Discussed benign nature of these lesions. Recommended observation for  now. Follow up as scheduled with dermatology.          Return in about 6 months (around 02/28/2015) for Recheck of Blood Pressure.

## 2014-08-28 NOTE — Progress Notes (Signed)
Pre visit review using our clinic review tool, if applicable. No additional management support is needed unless otherwise documented below in the visit note. 

## 2014-08-28 NOTE — Assessment & Plan Note (Signed)
BP Readings from Last 3 Encounters:  08/28/14 136/86  07/24/14 157/89  06/29/14 130/84   BP improved. Continue Lisinopril. Follow up in 6 months and prn.

## 2014-08-30 ENCOUNTER — Ambulatory Visit: Payer: Medicare Other

## 2014-08-31 ENCOUNTER — Ambulatory Visit
Admission: RE | Admit: 2014-08-31 | Discharge: 2014-08-31 | Disposition: A | Discharge status: Home / Self Care | Payer: Medicare Other | Source: Ambulatory Visit | Attending: Internal Medicine | Admitting: Internal Medicine

## 2014-08-31 ENCOUNTER — Other Ambulatory Visit: Payer: Self-pay | Admitting: Internal Medicine

## 2014-08-31 DIAGNOSIS — Z1239 Encounter for other screening for malignant neoplasm of breast: Secondary | ICD-10-CM

## 2014-08-31 DIAGNOSIS — Z1231 Encounter for screening mammogram for malignant neoplasm of breast: Secondary | ICD-10-CM | POA: Insufficient documentation

## 2014-08-31 DIAGNOSIS — Z139 Encounter for screening, unspecified: Secondary | ICD-10-CM

## 2014-09-04 ENCOUNTER — Other Ambulatory Visit: Payer: Self-pay | Admitting: Internal Medicine

## 2014-09-04 DIAGNOSIS — N6489 Other specified disorders of breast: Secondary | ICD-10-CM

## 2014-09-04 DIAGNOSIS — R928 Other abnormal and inconclusive findings on diagnostic imaging of breast: Secondary | ICD-10-CM

## 2014-09-06 NOTE — Pre-Procedure Instructions (Signed)
Katherine Stanley  09/06/2014      [facility] [address], [address] [place] [address] Phone: [phone] Fax: [phone]    Your procedure is scheduled on Tues, June 7 @ 7:15 AM  Report to [facility] Entrance A  at 5:30 AM  Call this number if you have problems the morning of surgery:  [phone]   Remember:  Do not eat food or drink liquids after midnight.                 Stop taking your Ibuprofen and Vit E along with any other Vitamins or Herbal Medications. No Goody's,BC's,Aleve,or Fish Oil.   Do not wear jewelry, make-up or nail polish.  Do not wear lotions, powders, or perfumes.  You may wear deodorant.  Do not shave 48 hours prior to surgery.    Do not bring valuables to the hospital.  [facility] is not responsible for any belongings or valuables.  Contacts, dentures or bridgework may not be worn into surgery.  Leave your suitcase in the car.  After surgery it may be brought to your room.  For patients admitted to the hospital, discharge time will be determined by your treatment team.  Patients discharged the day of surgery will not be allowed to drive home.    Special instructions:  Katherine [facility] - Preparing for Surgery  Before surgery, you can play an important role.  Because skin is not sterile, your skin needs to be as free of germs as possible.  You can reduce the number of germs on you skin by washing with CHG (chlorahexidine gluconate) soap before surgery.  CHG is an antiseptic cleaner which kills germs and bonds with the skin to continue killing germs even after washing.  Please DO NOT use if you have an allergy to CHG or antibacterial soaps.  If your skin becomes reddened/irritated stop using the CHG and inform your nurse when you arrive at Short Stay.  Do not shave (including legs and underarms) for at least 48 hours prior to the first CHG shower.  You may shave your face.  Please follow these instructions  carefully:   1.  Shower with CHG Soap the night before surgery and the                                morning of Surgery.  2.  If you choose to wash your hair, wash your hair first as usual with your       normal shampoo.  3.  After you shampoo, rinse your hair and body thoroughly to remove the                      Shampoo.  4.  Use CHG as you would any other liquid soap.  You can apply chg directly       to the skin and wash gently with scrungie or a clean washcloth.  5.  Apply the CHG Soap to your body ONLY FROM THE NECK DOWN.        Do not use on open wounds or open sores.  Avoid contact with your eyes,       ears, mouth and genitals (private parts).  Wash genitals (private parts)       with your normal soap.  6.  Wash thoroughly, paying special attention to the area where your  surgery        will be performed.  7.  Thoroughly rinse your body with warm water from the neck down.  8.  DO NOT shower/wash with your normal soap after using and rinsing off       the CHG Soap.  9.  Pat yourself dry with a clean towel.            10.  Wear clean pajamas.            11.  Place clean sheets on your bed the night of your first shower and do not        sleep with pets.  Day of Surgery  Do not apply any lotions/deoderants the morning of surgery.  Please wear clean clothes to the hospital/surgery center.    Please read over the following fact sheets that you were given. Pain Booklet, Coughing and Deep Breathing and Surgical Site Infection Prevention

## 2014-09-07 ENCOUNTER — Ambulatory Visit: Payer: Medicare Other

## 2014-09-07 ENCOUNTER — Encounter (HOSPITAL_COMMUNITY): Payer: Self-pay

## 2014-09-07 ENCOUNTER — Ambulatory Visit
Admission: RE | Admit: 2014-09-07 | Discharge: 2014-09-07 | Disposition: A | Discharge status: Home / Self Care | Payer: Medicare Other | Source: Ambulatory Visit | Attending: Internal Medicine | Admitting: Internal Medicine

## 2014-09-07 ENCOUNTER — Encounter (HOSPITAL_COMMUNITY)
Admission: RE | Admit: 2014-09-07 | Discharge: 2014-09-07 | Disposition: A | Discharge status: Home / Self Care | Payer: Medicare Other | Source: Ambulatory Visit | Attending: Orthopaedic Surgery | Admitting: Orthopaedic Surgery

## 2014-09-07 DIAGNOSIS — Z01818 Encounter for other preprocedural examination: Secondary | ICD-10-CM | POA: Diagnosis not present

## 2014-09-07 DIAGNOSIS — N6489 Other specified disorders of breast: Secondary | ICD-10-CM | POA: Insufficient documentation

## 2014-09-07 DIAGNOSIS — R9431 Abnormal electrocardiogram [ECG] [EKG]: Secondary | ICD-10-CM | POA: Insufficient documentation

## 2014-09-07 DIAGNOSIS — M1990 Unspecified osteoarthritis, unspecified site: Secondary | ICD-10-CM | POA: Diagnosis not present

## 2014-09-07 DIAGNOSIS — M8580 Other specified disorders of bone density and structure, unspecified site: Secondary | ICD-10-CM | POA: Diagnosis not present

## 2014-09-07 DIAGNOSIS — R928 Other abnormal and inconclusive findings on diagnostic imaging of breast: Secondary | ICD-10-CM | POA: Diagnosis not present

## 2014-09-07 DIAGNOSIS — I10 Essential (primary) hypertension: Secondary | ICD-10-CM | POA: Insufficient documentation

## 2014-09-07 DIAGNOSIS — R922 Inconclusive mammogram: Secondary | ICD-10-CM | POA: Diagnosis not present

## 2014-09-07 DIAGNOSIS — Z7982 Long term (current) use of aspirin: Secondary | ICD-10-CM | POA: Insufficient documentation

## 2014-09-07 DIAGNOSIS — Z01812 Encounter for preprocedural laboratory examination: Secondary | ICD-10-CM | POA: Diagnosis not present

## 2014-09-07 DIAGNOSIS — Z79899 Other long term (current) drug therapy: Secondary | ICD-10-CM | POA: Insufficient documentation

## 2014-09-07 HISTORY — DX: Headache, unspecified: R51.9

## 2014-09-07 HISTORY — DX: Headache: R51

## 2014-09-07 HISTORY — DX: Unspecified osteoarthritis, unspecified site: M19.90

## 2014-09-07 HISTORY — DX: Other specified disorders of bone density and structure, unspecified site: M85.80

## 2014-09-07 HISTORY — DX: Essential (primary) hypertension: I10

## 2014-09-07 HISTORY — DX: Diverticulosis of intestine, part unspecified, without perforation or abscess without bleeding: K57.90

## 2014-09-07 HISTORY — DX: Pain in unspecified joint: M25.50

## 2014-09-07 LAB — BASIC METABOLIC PANEL
Anion gap: 10 (ref 5–15)
BUN: 21 mg/dL — ABNORMAL HIGH (ref 6–20)
CALCIUM: 9.8 mg/dL (ref 8.9–10.3)
CO2: 20 mmol/L — ABNORMAL LOW (ref 22–32)
Chloride: 110 mmol/L (ref 101–111)
Creatinine, Ser: 0.69 mg/dL (ref 0.44–1.00)
GFR calc Af Amer: >60 mL/min (ref >60)
GFR calc non Af Amer: >60 mL/min (ref >60)
Glucose, Bld: 114 mg/dL — ABNORMAL HIGH (ref 65–99)
POTASSIUM: 4.6 mmol/L (ref 3.5–5.1)
SODIUM: 140 mmol/L (ref 135–145)

## 2014-09-07 LAB — CBC
HCT: 41.5 % (ref 36.0–46.0)
HEMOGLOBIN: 14.1 g/dL (ref 12.0–15.0)
MCH: 30.4 pg (ref 26.0–34.0)
MCHC: 34 g/dL (ref 30.0–36.0)
MCV: 89.4 fL (ref 78.0–100.0)
PLATELETS: 232 10*3/uL (ref 150–400)
RBC: 4.64 MIL/uL (ref 3.87–5.11)
RDW: 12.4 % (ref 11.5–15.5)
WBC: 6.8 10*3/uL (ref 4.0–10.5)

## 2014-09-07 NOTE — Progress Notes (Signed)
Requested orders from Dr.Whitfield's office.

## 2014-09-07 NOTE — Progress Notes (Addendum)
Pt doesn't have a Clinical research associate Md is Dr.Jennifer Gilford Rile  Denies ever having an Echo   Denies ever having a Stress test   Denies ever having Heart cath  Denies EKG in past yr  Denies CXR in past yr

## 2014-09-07 NOTE — Progress Notes (Addendum)
Anesthesia Chart Review:  Pt is 69 year old female scheduled for R shoulder arthroscopy with mini-open rotator cuff repair and distal clavicle resection, subacromial decompression, possible dermaspan patch, possible biceps tenodesis on 09/19/2014 with Dr. Durward Fortes.   PCP is Dr. Ronette Deter.   PMH includes: HTN, arthritis, osteopenia. Never smoker. BMI 32.  Medications include: ASA, lisinopril  Preoperative labs reviewed.    EKG 09/07/2014: Sinus tachycardia (102 bpm). Cannot rule out Anterior infarct, age undetermined  Pt has medical clearance from PCP in Epic note dated 08/28/2014.   Reviewed EKG with Dr. Deatra Canter.   If no changes, I anticipate pt can proceed with surgery as scheduled.   Willeen Cass, FNP-BC [facility]/Anesthesiology Phone: [phone] 09/12/2014 10:08 AM

## 2014-09-13 NOTE — H&P (Signed)
Katherine Fears, MD   Biagio Borg, PA-C [address] [place], [place]  [address]                             [phone]   [place] MRN:  [id number] DOB/SEX:  18-Jun-1945/female  CHIEF COMPLAINT:  Painful right shoulder  HISTORY: Katherine Stanley is being followed for the problem referable to her right shoulder as outlined in our office note from several months ago.  She has had trouble for probably 8 or 9 months, particularly with raising her arm overhead.  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  She is not having any numbness or tingling and the pain is localized along the lateral anterior aspect of her shoulder.  Because of her persistent discomfort an MRI scan was ordered.  The MRI scan reveals that she has a complete tear of the supraspinatus tendon with 2.7 cm of retraction.  There is severe tendinosis of the infraspinatus with possible mild articular surface tearing.  The teres minor and subscap are intact.  There is severe tendinosis of the intraarticular portions of the biceps tendon.  There was mild fatty atrophy of the infraspinatus muscle.  The remainder of the rotator cuff was normal.  The AC joint had moderate degenerative changes, a type I acromion and a small amount of subacromial and subdeltoid bursa.  There was no joint effusion, no focal chondral defect in the glenohumeral joint, no evidence of a labral tear, no focal bony marrow signal abnormality.   PAST MEDICAL HISTORY: Patient Active Problem List   Diagnosis Date Noted  . Right rotator cuff tear 08/28/2014  . 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 Diarrhea 07/05/2014  . Osteopenia 08/23/2012  . Medicare annual wellness visit, subsequent 07/19/2012  . Screening for breast cancer 07/19/2012  . 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 Hypertension 10/27/2011  . DUB (dysfunctional uterine bleeding)   .  Rosacea    Past Medical History  Diagnosis Date  . Rosacea   . Hypertension     takes Lisinopril daily  . Headache     occasionally  . Arthritis   . Joint pain   . Osteopenia   . Diverticulosis    Past Surgical History  Procedure Laterality Date  . Cholecystectomy    . Refractive surgery    . Cholecystectomy    . Tonsillectomy  at age 44  . Vaginal hysterectomy  at age 88  . Colonoscopy       MEDICATIONS:   Prescriptions prior to admission  Medication Sig Dispense Refill Last Dose  . acetaminophen (TYLENOL) 500 MG tablet Take 1,000 mg by mouth every 8 (eight) hours as needed for mild pain or moderate pain.   Past Week at Unknown time  . Ascorbic Acid (VITAMIN C PO) Take 1 tablet by mouth daily.    Past Week at Unknown time  . aspirin 81 MG tablet Take 81 mg by mouth daily.   Past Week at Unknown time  . Cholecalciferol (VITAMIN D PO) Take 2 tablets by mouth daily.    Past Week at Unknown time  . Cyanocobalamin (B-12 PO) Take 1 tablet by mouth once a week. Monday   Past Week at Unknown time  . folic acid (FOLVITE) 453 MCG tablet Take 400 mcg by mouth daily.   Past Week at Unknown time  .  Garlic 10 MG CAPS Take 1 tablet by mouth daily.   Past Week at Unknown time  . Ibuprofen (ADVIL) 200 MG CAPS Take 400 mg by mouth every 8 (eight) hours as needed.    Past Week at Unknown time  . lisinopril (PRINIVIL,ZESTRIL) 10 MG tablet Take 1 tablet (10 mg total) by mouth daily. TAKE ONE TABLET BY MOUTH EVERY DAY 90 tablet 3 09/18/2014 at Unknown time  . LYSINE PO Take 1 tablet by mouth as needed (fever blister).    Past Week at Unknown time  . magnesium gluconate (MAGONATE) 500 MG tablet Take 250 mg by mouth 2 (two) times daily.   Past Week at Unknown time  . potassium phosphate, monobasic, (K-PHOS ORIGINAL) 500 MG tablet Take 595 mg by mouth 4 (four) times daily -  with meals and at bedtime.   Past Week at Unknown time  . Probiotic Product (PROBIOTIC DAILY PO) Take 1 capsule by mouth daily.     Past Week at Unknown time  . VITAMIN E PO Take 1 capsule by mouth daily.    Past Week at Unknown time    ALLERGIES:   Allergies  Allergen Reactions  . Alendronate     Diffuse myalgia and arthralgia  . Aleve [Naproxen Sodium]     Sick to stomach    REVIEW OF SYSTEMS:  A comprehensive review of systems was negative except for: Cardiovascular: positive for hypertension   FAMILY HISTORY:   Family History  Problem Relation Age of Onset  . Hypertension Mother   . Heart disease Mother   . Kidney disease Mother   . Diabetes Father   . Hypertension Father   . Heart disease Father   . Heart disease Brother   . Cancer Maternal Aunt   . Ovarian cancer Maternal Aunt 85    SOCIAL HISTORY:   History  Substance Use Topics  . Smoking status: Never Smoker   . Smokeless tobacco: Not on file  . Alcohol Use: No      EXAMINATION: Vital signs in last 24 hours: Temp:  [97.8 F (36.6 C)] 97.8 F (36.6 C) (06/07 0545) Pulse Rate:  [98-117] 113 (06/07 0712) Resp:  [15-29] 15 (06/07 0711) BP: (125-159)/(65-83) 141/75 mmHg (06/07 0711) SpO2:  [94 %-99 %] 96 % (06/07 0712) Weight:  [76.204 kg (168 lb)] 76.204 kg (168 lb) (06/07 0610)  Head is normocephalic.   Eyes:  Pupils equal, round and reactive to light and accommodation.  Extraocular intact. ENT: Ears, nose, and throat were benign.   Neck: supple, no bruits were noted.   Chest: good expansion.   Lungs: essentially clear.   Cardiac: regular rhythm and rate, normal S1, S2.  No murmurs appreciated. Pulses :  2+ bilateral and symmetric in upper extremities. Abdomen is scaphoid, soft, nontender, no masses palpable, normal bowel sounds                  present. CNS:  He is oriented x3 and cranial nerves II-XII grossly intact. Breast, rectal, and genital exams: not performed and not indicated for an orthopedic evaluation. Musculoskeletal: pain with ROM, limited motion, weak in abduction and external rotation   Imaging Review The MRI  scan reveals that she has a complete tear of the supraspinatus tendon with 2.7 cm of retraction.  There is severe tendinosis of the infraspinatus with possible mild articular surface tearing.  The teres minor and subscap are intact.  There is severe tendinosis of the intraarticular portions of the biceps  tendon.  There was mild fatty atrophy of the infraspinatus muscle.  The remainder of the rotator cuff was normal.  The AC joint had moderate degenerative changes, a type I acromion and a small amount of subacromial and subdeltoid bursa.  There was no joint effusion, no focal chondral defect in the glenohumeral joint, no evidence of a labral tear, no focal bony marrow signal abnormality.  ASSESSMENT: right shoulder Rotator cuff tear with impingement and degenerative changes at the [facility] joint.  The supraspinatus is completely torn with retraction.  The infraspinatus had severe tendinosis with some partial surface tearing.    Past Medical History  Diagnosis Date  . Rosacea   . Hypertension     takes Lisinopril daily  . Headache     occasionally  . Arthritis   . Joint pain   . Osteopenia   . Diverticulosis     PLAN: Plan for right shoulder arthroscopic SAD-DCR to evaluate the joint and then do a mini open rotator cuff tear repair, possibly a DermaSpan patch, considering the poor quality of the  infraspinatus with fatty infiltration.    The procedure,  risks, and benefits of surgery were presented and reviewed. The risks including but not limited to infection, blood clots, vascular and nerve injury, stiffness,  among others were discussed. The patient acknowledged the explanation, agreed to proceed.   Mike Craze [place], [place] [phone]  09/19/2014

## 2014-09-18 MED ORDER — ACETAMINOPHEN 10 MG/ML IV SOLN
1000.0000 mg | INTRAVENOUS | Status: AC
  Administered 2014-09-19: 1000 mg via INTRAVENOUS
  Filled 2014-09-18: qty 100

## 2014-09-18 MED ORDER — SODIUM CHLORIDE 0.9 % IV SOLN: 75.0000 mL/h | INTRAVENOUS | Status: DC

## 2014-09-18 MED ORDER — CEFAZOLIN SODIUM-DEXTROSE 2-3 GM-% IV SOLR
2.0000 g | INTRAVENOUS | Status: AC
  Administered 2014-09-19: 2 g via INTRAVENOUS
  Filled 2014-09-18 (×2): qty 50

## 2014-09-19 ENCOUNTER — Encounter (HOSPITAL_COMMUNITY): Payer: Self-pay | Admitting: *Deleted

## 2014-09-19 ENCOUNTER — Encounter (HOSPITAL_COMMUNITY)
Admission: RE | Disposition: A | Discharge status: Home / Self Care | Payer: Self-pay | Source: Ambulatory Visit | Attending: Orthopaedic Surgery

## 2014-09-19 ENCOUNTER — Ambulatory Visit (HOSPITAL_COMMUNITY): Payer: Medicare Other | Admitting: Anesthesiology

## 2014-09-19 ENCOUNTER — Ambulatory Visit (HOSPITAL_COMMUNITY): Payer: Medicare Other | Admitting: Emergency Medicine

## 2014-09-19 ENCOUNTER — Ambulatory Visit (HOSPITAL_COMMUNITY)
Admission: RE | Admit: 2014-09-19 | Discharge: 2014-09-19 | Disposition: A | Discharge status: Home / Self Care | Payer: Medicare Other | Source: Ambulatory Visit | Attending: Orthopaedic Surgery | Admitting: Orthopaedic Surgery

## 2014-09-19 DIAGNOSIS — G8918 Other acute postprocedural pain: Secondary | ICD-10-CM | POA: Diagnosis not present

## 2014-09-19 DIAGNOSIS — M8580 Other specified disorders of bone density and structure, unspecified site: Secondary | ICD-10-CM | POA: Insufficient documentation

## 2014-09-19 DIAGNOSIS — L821 Other seborrheic keratosis: Secondary | ICD-10-CM | POA: Diagnosis not present

## 2014-09-19 DIAGNOSIS — Z7982 Long term (current) use of aspirin: Secondary | ICD-10-CM | POA: Insufficient documentation

## 2014-09-19 DIAGNOSIS — Z9049 Acquired absence of other specified parts of digestive tract: Secondary | ICD-10-CM | POA: Insufficient documentation

## 2014-09-19 DIAGNOSIS — Z79899 Other long term (current) drug therapy: Secondary | ICD-10-CM | POA: Insufficient documentation

## 2014-09-19 DIAGNOSIS — K5730 Diverticulosis of large intestine without perforation or abscess without bleeding: Secondary | ICD-10-CM | POA: Insufficient documentation

## 2014-09-19 DIAGNOSIS — Z90710 Acquired absence of both cervix and uterus: Secondary | ICD-10-CM | POA: Diagnosis not present

## 2014-09-19 DIAGNOSIS — M19011 Primary osteoarthritis, right shoulder: Secondary | ICD-10-CM | POA: Insufficient documentation

## 2014-09-19 DIAGNOSIS — M75101 Unspecified rotator cuff tear or rupture of right shoulder, not specified as traumatic: Secondary | ICD-10-CM | POA: Insufficient documentation

## 2014-09-19 DIAGNOSIS — S46211A Strain of muscle, fascia and tendon of other parts of biceps, right arm, initial encounter: Secondary | ICD-10-CM | POA: Diagnosis present

## 2014-09-19 DIAGNOSIS — M75121 Complete rotator cuff tear or rupture of right shoulder, not specified as traumatic: Secondary | ICD-10-CM | POA: Diagnosis not present

## 2014-09-19 DIAGNOSIS — I10 Essential (primary) hypertension: Secondary | ICD-10-CM | POA: Insufficient documentation

## 2014-09-19 DIAGNOSIS — M7521 Bicipital tendinitis, right shoulder: Secondary | ICD-10-CM | POA: Diagnosis not present

## 2014-09-19 DIAGNOSIS — M7541 Impingement syndrome of right shoulder: Secondary | ICD-10-CM | POA: Diagnosis present

## 2014-09-19 DIAGNOSIS — M19019 Primary osteoarthritis, unspecified shoulder: Secondary | ICD-10-CM | POA: Diagnosis present

## 2014-09-19 DIAGNOSIS — Z888 Allergy status to other drugs, medicaments and biological substances status: Secondary | ICD-10-CM | POA: Insufficient documentation

## 2014-09-19 HISTORY — PX: SHOULDER ARTHROSCOPY WITH OPEN ROTATOR CUFF REPAIR AND DISTAL CLAVICLE ACROMINECTOMY: SHX5683

## 2014-09-19 SURGERY — SHOULDER ARTHROSCOPY WITH OPEN ROTATOR CUFF REPAIR AND DISTAL CLAVICLE ACROMINECTOMY
Anesthesia: Regional | Site: Shoulder | Laterality: Right

## 2014-09-19 MED ORDER — PHENYLEPHRINE HCL 10 MG/ML IJ SOLN
INTRAMUSCULAR | Status: DC | PRN
  Administered 2014-09-19 (×5): 80 ug via INTRAVENOUS

## 2014-09-19 MED ORDER — MIDAZOLAM HCL 2 MG/2ML IJ SOLN
INTRAMUSCULAR | Status: AC
  Filled 2014-09-19: qty 2

## 2014-09-19 MED ORDER — HYDROMORPHONE HCL 1 MG/ML IJ SOLN: 0.2500 mg | INTRAMUSCULAR | Status: DC | PRN

## 2014-09-19 MED ORDER — DEXAMETHASONE SODIUM PHOSPHATE 4 MG/ML IJ SOLN
INTRAMUSCULAR | Status: DC | PRN
  Administered 2014-09-19: 4 mg via INTRAVENOUS

## 2014-09-19 MED ORDER — GLYCOPYRROLATE 0.2 MG/ML IJ SOLN
INTRAMUSCULAR | Status: DC | PRN
  Administered 2014-09-19: 0.6 mg via INTRAVENOUS

## 2014-09-19 MED ORDER — BUPIVACAINE-EPINEPHRINE (PF) 0.5% -1:200000 IJ SOLN
INTRAMUSCULAR | Status: DC | PRN
  Administered 2014-09-19: 25 mL via PERINEURAL

## 2014-09-19 MED ORDER — PROPOFOL 10 MG/ML IV BOLUS
INTRAVENOUS | Status: DC | PRN
  Administered 2014-09-19: 170 mg via INTRAVENOUS

## 2014-09-19 MED ORDER — HYDROMORPHONE HCL 1 MG/ML IJ SOLN
0.2500 mg | INTRAMUSCULAR | Status: DC | PRN
  Administered 2014-09-19 (×2): 0.5 mg via INTRAVENOUS

## 2014-09-19 MED ORDER — EPHEDRINE SULFATE 50 MG/ML IJ SOLN
INTRAMUSCULAR | Status: AC
  Filled 2014-09-19: qty 1

## 2014-09-19 MED ORDER — SODIUM CHLORIDE 0.9 % IJ SOLN
INTRAMUSCULAR | Status: AC
  Filled 2014-09-19: qty 10

## 2014-09-19 MED ORDER — MIDAZOLAM HCL 5 MG/5ML IJ SOLN
INTRAMUSCULAR | Status: DC | PRN
  Administered 2014-09-19: 2 mg via INTRAVENOUS

## 2014-09-19 MED ORDER — OXYCODONE-ACETAMINOPHEN 5-325 MG PO TABS: 1.0000 | ORAL_TABLET | ORAL | Status: DC | PRN

## 2014-09-19 MED ORDER — PROMETHAZINE HCL 25 MG/ML IJ SOLN: 6.2500 mg | INTRAMUSCULAR | Status: DC | PRN

## 2014-09-19 MED ORDER — PHENYLEPHRINE HCL 10 MG/ML IJ SOLN
10.0000 mg | INTRAVENOUS | Status: DC | PRN
  Administered 2014-09-19: 15 ug/min via INTRAVENOUS

## 2014-09-19 MED ORDER — SUCCINYLCHOLINE CHLORIDE 20 MG/ML IJ SOLN
INTRAMUSCULAR | Status: AC
  Filled 2014-09-19: qty 1

## 2014-09-19 MED ORDER — FENTANYL CITRATE (PF) 100 MCG/2ML IJ SOLN
INTRAMUSCULAR | Status: DC | PRN
  Administered 2014-09-19: 100 ug via INTRAVENOUS
  Administered 2014-09-19 (×5): 50 ug via INTRAVENOUS

## 2014-09-19 MED ORDER — ARTIFICIAL TEARS OP OINT
TOPICAL_OINTMENT | OPHTHALMIC | Status: AC
  Filled 2014-09-19: qty 3.5

## 2014-09-19 MED ORDER — LIDOCAINE HCL (CARDIAC) 20 MG/ML IV SOLN
INTRAVENOUS | Status: DC | PRN
  Administered 2014-09-19: 80 mg via INTRAVENOUS

## 2014-09-19 MED ORDER — ONDANSETRON HCL 4 MG/2ML IJ SOLN
INTRAMUSCULAR | Status: AC
  Filled 2014-09-19: qty 2

## 2014-09-19 MED ORDER — LIDOCAINE-EPINEPHRINE 1 %-1:100000 IJ SOLN
INTRAMUSCULAR | Status: AC
  Filled 2014-09-19: qty 1

## 2014-09-19 MED ORDER — HYDROMORPHONE HCL 1 MG/ML IJ SOLN
INTRAMUSCULAR | Status: AC
  Filled 2014-09-19: qty 1

## 2014-09-19 MED ORDER — ONDANSETRON HCL 4 MG/2ML IJ SOLN
INTRAMUSCULAR | Status: DC | PRN
  Administered 2014-09-19: 4 mg via INTRAVENOUS

## 2014-09-19 MED ORDER — ARTIFICIAL TEARS OP OINT
TOPICAL_OINTMENT | OPHTHALMIC | Status: DC | PRN
  Administered 2014-09-19: 1 via OPHTHALMIC

## 2014-09-19 MED ORDER — PROPOFOL 10 MG/ML IV BOLUS
INTRAVENOUS | Status: AC
  Filled 2014-09-19: qty 20

## 2014-09-19 MED ORDER — MIDAZOLAM HCL 2 MG/2ML IJ SOLN: 0.5000 mg | Freq: Once | INTRAMUSCULAR | Status: DC | PRN

## 2014-09-19 MED ORDER — LIDOCAINE HCL (CARDIAC) 20 MG/ML IV SOLN
INTRAVENOUS | Status: AC
  Filled 2014-09-19: qty 5

## 2014-09-19 MED ORDER — BUPIVACAINE HCL (PF) 0.25 % IJ SOLN
INTRAMUSCULAR | Status: AC
  Filled 2014-09-19: qty 30

## 2014-09-19 MED ORDER — NEOSTIGMINE METHYLSULFATE 10 MG/10ML IV SOLN
INTRAVENOUS | Status: DC | PRN
  Administered 2014-09-19: 4 mg via INTRAVENOUS

## 2014-09-19 MED ORDER — SODIUM CHLORIDE 0.9 % IR SOLN
Status: DC | PRN
  Administered 2014-09-19: 6000 mL

## 2014-09-19 MED ORDER — GLYCOPYRROLATE 0.2 MG/ML IJ SOLN
INTRAMUSCULAR | Status: AC
  Filled 2014-09-19: qty 3

## 2014-09-19 MED ORDER — LACTATED RINGERS IV SOLN
INTRAVENOUS | Status: DC | PRN
  Administered 2014-09-19 (×2): via INTRAVENOUS

## 2014-09-19 MED ORDER — ROCURONIUM BROMIDE 100 MG/10ML IV SOLN
INTRAVENOUS | Status: DC | PRN
  Administered 2014-09-19: 50 mg via INTRAVENOUS

## 2014-09-19 MED ORDER — FENTANYL CITRATE (PF) 250 MCG/5ML IJ SOLN
INTRAMUSCULAR | Status: AC
  Filled 2014-09-19: qty 5

## 2014-09-19 MED ORDER — ROCURONIUM BROMIDE 50 MG/5ML IV SOLN
INTRAVENOUS | Status: AC
  Filled 2014-09-19: qty 1

## 2014-09-19 MED ORDER — CHLORHEXIDINE GLUCONATE 4 % EX LIQD: 60.0000 mL | Freq: Once | CUTANEOUS | Status: DC

## 2014-09-19 MED ORDER — NEOSTIGMINE METHYLSULFATE 10 MG/10ML IV SOLN
INTRAVENOUS | Status: AC
  Filled 2014-09-19: qty 1

## 2014-09-19 MED ORDER — MEPERIDINE HCL 25 MG/ML IJ SOLN: 6.2500 mg | INTRAMUSCULAR | Status: DC | PRN

## 2014-09-19 SURGICAL SUPPLY — 57 items
ANCH SUT PUSHLCK 24X4.5 STRL (Orthopedic Implant) ×1 IMPLANT
ANCHOR PEEK ALL THREAD (Anchor) ×2 IMPLANT
APL SKNCLS STERI-STRIP NONHPOA (GAUZE/BANDAGES/DRESSINGS) ×1
BENZOIN TINCTURE PRP APPL 2/3 (GAUZE/BANDAGES/DRESSINGS) ×2 IMPLANT
BLADE GREAT WHITE 4.2 (BLADE) ×2 IMPLANT
BLADE SURG 11 STRL SS (BLADE) ×2 IMPLANT
BNDG COHESIVE 3X5 TAN STRL LF (GAUZE/BANDAGES/DRESSINGS) ×1 IMPLANT
BUR OVAL 6.0 (BURR) ×2 IMPLANT
CANNULA ACUFLEX KIT 5X76 (CANNULA) ×2 IMPLANT
CLSR STERI-STRIP ANTIMIC 1/2X4 (GAUZE/BANDAGES/DRESSINGS) ×1 IMPLANT
COVER SURGICAL LIGHT HANDLE (MISCELLANEOUS) ×2 IMPLANT
DRAPE SHOULDER BEACH CHAIR (DRAPES) ×2 IMPLANT
DRSG OPSITE POSTOP 3X4 (GAUZE/BANDAGES/DRESSINGS) ×1 IMPLANT
DRSG PAD ABDOMINAL 8X10 ST (GAUZE/BANDAGES/DRESSINGS) ×2 IMPLANT
DURAPREP 26ML APPLICATOR (WOUND CARE) ×2 IMPLANT
ELECT NDL TIP 2.8 STRL (NEEDLE) ×1 IMPLANT
ELECT NEEDLE TIP 2.8 STRL (NEEDLE) ×2 IMPLANT
ELECT REM PT RETURN 9FT ADLT (ELECTROSURGICAL) ×2
ELECTRODE REM PT RTRN 9FT ADLT (ELECTROSURGICAL) ×1 IMPLANT
GAUZE SPONGE 4X4 12PLY STRL (GAUZE/BANDAGES/DRESSINGS) ×2 IMPLANT
GLOVE BIOGEL PI IND STRL 7.5 (GLOVE) IMPLANT
GLOVE BIOGEL PI IND STRL 8 (GLOVE) ×1 IMPLANT
GLOVE BIOGEL PI IND STRL 8.5 (GLOVE) ×1 IMPLANT
GLOVE BIOGEL PI INDICATOR 7.5 (GLOVE) ×1
GLOVE BIOGEL PI INDICATOR 8 (GLOVE) ×1
GLOVE BIOGEL PI INDICATOR 8.5 (GLOVE) ×1
GLOVE ECLIPSE 8.0 STRL XLNG CF (GLOVE) ×2 IMPLANT
GLOVE SURG ORTHO 8.5 STRL (GLOVE) ×4 IMPLANT
GLOVE SURG SS PI 6.5 STRL IVOR (GLOVE) ×2 IMPLANT
GOWN STRL REUS W/ TWL LRG LVL3 (GOWN DISPOSABLE) ×2 IMPLANT
GOWN STRL REUS W/TWL 2XL LVL3 (GOWN DISPOSABLE) ×2 IMPLANT
GOWN STRL REUS W/TWL LRG LVL3 (GOWN DISPOSABLE) ×4
KIT ROOM TURNOVER OR (KITS) ×2 IMPLANT
MANIFOLD NEPTUNE II (INSTRUMENTS) ×2 IMPLANT
NDL SPNL 18GX3.5 QUINCKE PK (NEEDLE) ×1 IMPLANT
NEEDLE SPNL 18GX3.5 QUINCKE PK (NEEDLE) ×2 IMPLANT
NS IRRIG 1000ML POUR BTL (IV SOLUTION) ×2 IMPLANT
PACK SHOULDER (CUSTOM PROCEDURE TRAY) ×2 IMPLANT
PAD ABD 8X10 STRL (GAUZE/BANDAGES/DRESSINGS) ×1 IMPLANT
PAD ARMBOARD 7.5X6 YLW CONV (MISCELLANEOUS) ×4 IMPLANT
PUSHLOCK PEEK 4.5X24 (Orthopedic Implant) ×1 IMPLANT
SET ARTHROSCOPY TUBING (MISCELLANEOUS) ×2
SET ARTHROSCOPY TUBING LN (MISCELLANEOUS) ×1 IMPLANT
SLING ARM IMMOBILIZER LRG (SOFTGOODS) ×1 IMPLANT
STRIP CLOSURE SKIN 1/2X4 (GAUZE/BANDAGES/DRESSINGS) ×2 IMPLANT
SUT ETHIBOND 0 MO6 C/R (SUTURE) ×1 IMPLANT
SUT ETHIBOND 2 OS 4 DA (SUTURE) ×2 IMPLANT
SUT MNCRL AB 3-0 PS2 18 (SUTURE) ×2 IMPLANT
SUT VIC AB 0 CT1 27 (SUTURE) ×2
SUT VIC AB 0 CT1 27XBRD ANBCTR (SUTURE) ×1 IMPLANT
SUT VICRYL 0 UR6 27IN ABS (SUTURE) ×2 IMPLANT
TAPE CLOTH SURG 4X10 WHT LF (GAUZE/BANDAGES/DRESSINGS) ×1 IMPLANT
TOWEL OR 17X24 6PK STRL BLUE (TOWEL DISPOSABLE) ×2 IMPLANT
TOWEL OR 17X26 10 PK STRL BLUE (TOWEL DISPOSABLE) ×2 IMPLANT
TUBE CONNECTING 12X1/4 (SUCTIONS) ×4 IMPLANT
WAND HAND CNTRL MULTIVAC 90 (MISCELLANEOUS) ×2 IMPLANT
WATER STERILE IRR 1000ML POUR (IV SOLUTION) ×2 IMPLANT

## 2014-09-19 NOTE — Op Note (Signed)
PATIENT ID:      Katherine Stanley  MRN:     [id number] DOB/AGE:    1945-12-06 / 69 y.o.       OPERATIVE REPORT    DATE OF PROCEDURE:  09/19/2014       PREOPERATIVE DIAGNOSIS:   RIGHT SHOULDER ROTATOR CUFF TEAR WITH IMPINGEMENT, DJD A-C JOINT                                                       Estimated body mass index is 32.55 kg/(m^2) as calculated from the following:   Height as of this encounter: 5' 0.25" (1.53 m).   Weight as of this encounter: 76.204 kg (168 lb).     POSTOPERATIVE DIAGNOSIS:   SAME WITH BICEPS TENDON TEAR                                                                 Estimated body mass index is 32.55 kg/(m^2) as calculated from the following:   Height as of this encounter: 5' 0.25" (1.53 m).   Weight as of this encounter: 76.204 kg (168 lb).     PROCEDURE:  Procedure(s):RIGHT SHOULDER ARTHROSCOPY WITH DEBRIDEMENT OF SYNOVITIS,BICEPS TENDON STUMP,SUBACROMIAL DECOMPRESSION, DISTAL CLAVICLE RESECTION, MINI OPEN RCT REPAIR     SURGEON:  [name], MD    ASSISTANT:   Biagio Borg, PA-C   (Present and scrubbed throughout the case, critical for assistance with exposure, retraction, instrumentation, and closure.)          ANESTHESIA: regional and general     DRAINS: none :      TOURNIQUET TIME: * No tourniquets in log *    COMPLICATIONS:  None   CONDITION:  stable  PROCEDURE IN DETAIL: [id number]   Katherine Stanley 09/19/2014, 9:10 AM

## 2014-09-19 NOTE — Op Note (Signed)
NAMEMarland Kitchen  ANAID, HANEY NO.:  [id number]  MEDICAL RECORD NO.:  [id number]  LOCATION:  [facility]                         FACILITY:  [facility]  PHYSICIAN:  Vonna Kotyk. Quyen Cutsforth, M.D.DATE OF BIRTH:  [birth year]  DATE OF PROCEDURE:  09/19/2014 DATE OF DISCHARGE:                              OPERATIVE REPORT   PREOPERATIVE DIAGNOSES: 1. Right shoulder rotator cuff tear with impingement. 2. Degenerative joint disease, acromioclavicular joint.  POSTOPERATIVE DIAGNOSES: 1. Right shoulder rotator cuff tear with impingement. 2. Degenerative joint disease, acromioclavicular joint with complete     tear of biceps tendon.  PROCEDURE: 1. Evaluation of right shoulder under anesthesia. 2. Diagnostic arthroscopy, right shoulder with debridement of     synovitis and biceps tendon stump. 3. Arthroscopic subacromial decompression. 4. Arthroscopic distal clavicle resection. 5. Mini-open rotator cuff tear repair.  SURGEON:  Vonna Kotyk. Durward Fortes, M.D.  ASSISTANT:  Aaron Edelman D. Petrarca, PA-C.  ANESTHESIA:  General with supplemental interscalene nerve block.  COMPLICATIONS:  None.  DESCRIPTION OF PROCEDURE:  Ms. Rosemeyer was met in the holding area and identified the right shoulder as appropriate operative site and marked it accordingly.  She received preoperative interscalene nerve block for anesthesia.  The patient was then transported to room #9, placed under general anesthesia without difficulty.  The patient was then placed in a semi- sitting position with the shoulder frame.  I performed an examination of the right shoulder and anesthesia without evidence of instability or adhesive capsulitis.  The right shoulder was then prepped with DuraPrep and the base of the neck circumferentially below the elbow.  Sterile draping was performed. Time-out was called.  Marking pen was used to outline the [facility] joint, the coracoid, and acromion.  A pointed fingerbreadth posterior medial to the  posterior angle acromion, a small stab wound was made.  The arthroscope was easily placed into the shoulder joint.  There was a minimal clear yellow joint effusion.  Diagnostic arthroscopy revealed no evidence of loose bodies.  I did not see any appreciable chondromalacia of the humeral head or glenoid. There was diffuse fraying of the biceps tendon and a complete tear of the rotator cuff as I could easily visualize the subacromial space.  The second portal was then established anteriorly with the rib cannula and I debrided synovitis, at which point, I had a better visualization.  I could see that the biceps tendon had completely been torn with a stump. The stump was debrided back to the superior glenoid rim.  There appeared to be a partial tear of the subscap.  Labrum appeared to be intact.  The arthroscope was then placed in the subacromial space posteriorly, the cannula in the subacromial space anteriorly and a third portal established in the lateral subacromial space.  Arthroscopic subacromial decompression was performed.  There was considerable inflammatory subacromial bursal tissue.  This was resected with the ArthroCare wand.  At that point, I could visualize the anterior and lateral acromion.  There was some overhang in both places and accordingly and anterior inferior and lateral acromioplasty was performed with a 6 mm bur with a nice flat resection.  No obvious degenerative changes about the distal clavicle with inflammatory synovitis.  Synovectomy  was performed.  The distal clavicle was resected with the same 6 mm bur.  The rotator cuff tear was also quite obvious in the bursal surface with retraction and with exposed humeral head.  Mini open rotator cuff tear repair was then performed.  About an inch and a half incision was made over the anterior aspect of the shoulder via sharp dissection, carried down to subcutaneous tissue.  Gross bleeders were Bovie coagulated.  A  raphe in the deltoid fascia was identified and incised.  Self-retaining retractor was inserted.  The subacromial space entered.  There was still some inflammatory bursal tissue anteriorly, which was debrided with a rongeur.  Rotator cuff tear was then visualized.  It was both infra and supraspinatus tendons, and there was some extent, the subscap had been torn in a V fashion extending about an inch and a half.  Superiorly. There was some tearing transversely of the infraspinatus on the humeral head.  The edges were then manually release, so I could advance them and I debrided any old rounded edges to good bleeding tissue.  I then debrided the humeral head to bleeding tissue, inserted 2 Biomet 5.5 mm peek anchors with attached #2 FiberWire.  I repaired the V tear side to side with 0 Ethibond suture and then secured the remaining edges to the debrided humeral head.  I did a second row repair of the supraspinatus and subscap with suture through bone and then used a PushLock anchor to secure the infraspinatus more laterally, but had a very nice repair. There was no tension.  There was no instability.  Finger palpation thought I had an excellent subacromial decompression.  There was no evidence of further impingement.  The wound was then irrigated with saline solution.  The deltoid fascia was closed with a running 0 Vicryl, subcu with 3-0 Monocryl.  Skin closed with Steri-Strips over benzoin.  Sterile bulky dressing was applied followed by a sling.  PLAN:  Discharge as outpatient.  Oxycodone for pain.  Office, 1 week.     Vonna Kotyk. Durward Fortes, M.D.     PWW/MEDQ  D:  09/19/2014  T:  09/19/2014  Job:  [id number]

## 2014-09-19 NOTE — Anesthesia Postprocedure Evaluation (Signed)
  Anesthesia Post-op Note  Patient: Katherine Stanley  Procedure(s) Performed: Procedure(s): SHOULDER ARTHROSCOPY WITH MINI-OPEN ROTATOR CUFF REPAIR AND DISTAL CLAVICLE RESECTION, SUBACROMIAL DECOMPRESSION (Right)  Patient Location: PACU  Anesthesia Type:GA combined with regional for post-op pain  Level of Consciousness: awake, alert , oriented and patient cooperative  Airway and Oxygen Therapy: Patient Spontanous Breathing and Patient connected to nasal cannula oxygen  Post-op Pain: mild  Post-op Assessment: Post-op Vital signs reviewed, Patient's Cardiovascular Status Stable, Respiratory Function Stable, Patent Airway, No signs of Nausea or vomiting and Pain level controlled  Post-op Vital Signs: Reviewed and stable  Last Vitals:  Filed Vitals:   09/19/14 1051  BP: 129/68  Pulse: 87  Temp:   Resp: 16    Complications: No apparent anesthesia complications

## 2014-09-19 NOTE — Anesthesia Procedure Notes (Addendum)
Anesthesia Regional Block:  Interscalene brachial plexus block  Pre-Anesthetic Checklist: ,, timeout performed, Correct Patient, Correct Site, Correct Laterality, Correct Procedure, Correct Position, site marked, Risks and benefits discussed,  Surgical consent,  Pre-op evaluation,  At surgeon's request and post-op pain management  Laterality: Right and Upper  Prep: chloraprep       Needles:  Injection technique: Single-shot  Needle Type: Echogenic Needle     Needle Length: 5cm 5 cm Needle Gauge: 21 and 21 G    Additional Needles:  Procedures: ultrasound guided (picture in chart) Interscalene brachial plexus block Narrative:  Start time: 09/19/2014 6:48 AM End time: 09/19/2014 6:54 AM Injection made incrementally with aspirations every 5 mL.  Performed by: Personally  Anesthesiologist: CREWS, DAVID   Procedure Name: Intubation Date/Time: 09/19/2014 7:25 AM Performed by: Willeen Cass P Pre-anesthesia Checklist: Patient identified, Timeout performed, Emergency Drugs available, Suction available and Patient being monitored Patient Re-evaluated:Patient Re-evaluated prior to inductionOxygen Delivery Method: Circle system utilized Preoxygenation: Pre-oxygenation with 100% oxygen Intubation Type: IV induction Ventilation: Mask ventilation without difficulty Laryngoscope Size: Mac and 3 Grade View: Grade I Tube type: Oral Tube size: 7.0 mm Number of attempts: 2 (1 DL by Sherry Ruffing (paramedic student), unable to pass ETT; 1 DL by CRNA, ATOI without difficulty.  Easy mask ventilation between DL's.) Airway Equipment and Method: Stylet Placement Confirmation: ETT inserted through vocal cords under direct vision,  breath sounds checked- equal and bilateral and positive ETCO2 Secured at: 23 cm Tube secured with: Tape Dental Injury: Teeth and Oropharynx as per pre-operative assessment  Comments: Sherry Ruffing, paramedic student.

## 2014-09-19 NOTE — Anesthesia Preprocedure Evaluation (Signed)
Anesthesia Evaluation  Patient identified by MRN, date of birth, ID band Patient awake    Reviewed: Allergy & Precautions, NPO status , Patient's Chart, lab work & pertinent test results  Airway Mallampati: I  TM Distance: >3 FB Neck ROM: Full    Dental  (+) Teeth Intact, Dental Advisory Given   Pulmonary  breath sounds clear to auscultation        Cardiovascular hypertension, Pt. on medications Rhythm:Regular Rate:Normal     Neuro/Psych    GI/Hepatic   Endo/Other    Renal/GU      Musculoskeletal   Abdominal   Peds  Hematology   Anesthesia Other Findings   Reproductive/Obstetrics                             Anesthesia Physical Anesthesia Plan  ASA: II  Anesthesia Plan: General and Regional   Post-op Pain Management:    Induction: Intravenous  Airway Management Planned: Oral ETT  Additional Equipment:   Intra-op Plan:   Post-operative Plan: Extubation in OR  Informed Consent: I have reviewed the patients History and Physical, chart, labs and discussed the procedure including the risks, benefits and alternatives for the proposed anesthesia with the patient or authorized representative who has indicated his/her understanding and acceptance.   Dental advisory given  Plan Discussed with: CRNA, Anesthesiologist and Surgeon  Anesthesia Plan Comments:         Anesthesia Quick Evaluation

## 2014-09-19 NOTE — Transfer of Care (Signed)
Immediate Anesthesia Transfer of Care Note  Patient: Katherine Stanley  Procedure(s) Performed: Procedure(s): SHOULDER ARTHROSCOPY WITH MINI-OPEN ROTATOR CUFF REPAIR AND DISTAL CLAVICLE RESECTION, SUBACROMIAL DECOMPRESSION (Right)  Patient Location: PACU  Anesthesia Type:General  Level of Consciousness: awake, alert , oriented and patient cooperative  Airway & Oxygen Therapy: Patient Spontanous Breathing and Patient connected to nasal cannula oxygen  Post-op Assessment: Report given to RN, Post -op Vital signs reviewed and stable and Patient moving all extremities X 4  Post vital signs: Reviewed and stable  Complications: No apparent anesthesia complications

## 2014-09-19 NOTE — H&P (Signed)
  The recent History & Physical has been reviewed. I have personally examined the patient today. There is no interval change to the documented History & Physical. The patient would like to proceed with the procedure.  [name] W 09/19/2014,  7:08 AM

## 2014-09-20 ENCOUNTER — Encounter (HOSPITAL_COMMUNITY): Payer: Self-pay | Admitting: Orthopaedic Surgery

## 2014-10-03 ENCOUNTER — Ambulatory Visit: Payer: Medicare Other | Attending: Orthopaedic Surgery | Admitting: Physical Therapy

## 2014-10-03 ENCOUNTER — Encounter: Payer: Self-pay | Admitting: Physical Therapy

## 2014-10-03 DIAGNOSIS — M25511 Pain in right shoulder: Secondary | ICD-10-CM | POA: Diagnosis not present

## 2014-10-03 DIAGNOSIS — Z9889 Other specified postprocedural states: Secondary | ICD-10-CM

## 2014-10-03 NOTE — Therapy (Signed)
[place] MAIN [facility] SERVICES [address] [place], [place], [address] Phone: [phone]   Fax:  [phone]  Physical Therapy Evaluation  Patient Details  Name: Katherine Stanley MRN: [id number] Date of Birth: 1945/05/24 Referring Provider:  Garald Balding, MD  Encounter Date: 10/03/2014      PT End of Session - 10/03/14 0851    Visit Number 1   Number of Visits 17   Date for PT Re-Evaluation 12/12/14   Authorization Type 1   Authorization Time Period 10   PT Start Time 0800   PT Stop Time 0848   PT Time Calculation (min) 48 min   Activity Tolerance Patient tolerated treatment well   Behavior During Therapy [facility] for tasks assessed/performed      Past Medical History  Diagnosis Date  . Rosacea   . Hypertension     takes Lisinopril daily  . Headache     occasionally  . Arthritis   . Joint pain   . Osteopenia   . Diverticulosis     Past Surgical History  Procedure Laterality Date  . Cholecystectomy    . Refractive surgery    . Cholecystectomy    . Tonsillectomy  at age 83  . Vaginal hysterectomy  at age 25  . Colonoscopy    . Shoulder arthroscopy with open rotator cuff repair and distal clavicle acrominectomy Right 09/19/2014    Procedure: SHOULDER ARTHROSCOPY WITH MINI-OPEN ROTATOR CUFF REPAIR AND DISTAL CLAVICLE RESECTION, SUBACROMIAL DECOMPRESSION;  Surgeon: Garald Balding, MD;  Location: [facility];  Service: Orthopedics;  Laterality: Right;    There were no vitals filed for this visit.  Visit Diagnosis:  H/O repair of right rotator cuff - Plan: PT plan of care cert/re-cert  Right shoulder pain - Plan: PT plan of care cert/re-cert      Subjective Assessment - 10/03/14 0804    Subjective Patient reports she had a cortisone shot, and felt a "tear" in her right shoulder. She had been experiencing pain in the biceps region and in the shoulder.   Pertinent History Patient had a right rotator cuff repair performed on  09/19/2014.She had a full tear of a RC tendon and long head of the biceps.    Limitations Lifting;Writing;House hold activities   Patient Stated Goals To return to playing golf   Currently in Pain? Yes   Pain Score 0-No pain   Pain Location Shoulder   Pain Orientation Right   Pain Type Surgical pain   Aggravating Factors  --  Patient reports not really taking any pain medications and having very minimal pain that she attributes to surgical pain.    Effect of Pain on Daily Activities Her weight bearing restrictions limit her ability to perform ADLs and recreational activities.         All exercises performed during this session were for the same sets, reps, and times as the HEP provided.                        PT Education - 10/03/14 0850    Education provided Yes   Education Details Patient educated on HEP and progressions with PT. Patient reminded to continue to wear sling and not actively use any right shoulder musculature aside from in directions and planes of HEP.    Person(s) Educated Patient   Methods Explanation;Demonstration;Handout   Comprehension Verbalized understanding;Returned demonstration  PT Long Term Goals - Oct 17, 2014 1255    PT LONG TERM GOAL #1   Title Patient will be independent with a home exercise program to increase strength, ROM, and decrease pain and increase function by 12/12/2014.   Status New   PT LONG TERM GOAL #2   Title Patient will display full pain free AROM of her RUE to display ability to complete ADLs by 12/12/2014.    Baseline Active assist or passive ROM only currently.    Status New   PT LONG TERM GOAL #3   Title Patient will report a Quick Dash score of at least 60/80 to demonstrate increased tolerance for ADLs and performance of functional activities by 12/12/2014.    Baseline 0 currently as she is only able to perform PROM and AAROM   Status New   PT LONG TERM GOAL #4   Title Patient will display at least  4/5 strength in all shoulder girdle musculature to demonstrate increased strength and return to function by 12/12/2014.    Baseline Deferred testing at eval secondary to proximity to surgery.    Status New               Plan - 2014/10/17 0854    Clinical Impression Statement Patient is a very pleasant 69 y/o female that presents 14 days s/p medium to large right rotator cuff. She is experiencing very little pain at this time in her RUE and is able to complete all passive and active assisted activities today with no increase in symptoms. She demonstrates appropriate strength and passive ROM at this time, and will be progressed as per the [facility] protocol for medium to large rotator cuff repairs. Skilled PT services are indicated to appropriately progress her recovery from rotator cuff repair to increase her RUE function.    Pt will benefit from skilled therapeutic intervention in order to improve on the following deficits Impaired UE functional use;Pain;Decreased range of motion;Decreased strength   Rehab Potential Good   Clinical Impairments Affecting Rehab Potential Surgical repair of right rotator cuff   PT Frequency 2x / week   PT Duration 8 weeks   PT Treatment/Interventions ADLs/Self Care Home Management;Aquatic Therapy;Cryotherapy;Ultrasound;Dry needling;Passive range of motion;Neuromuscular re-education;Scar mobilization;Therapeutic exercise;Therapeutic activities;Electrical Stimulation;Manual techniques   PT Next Visit Plan Progress HEP as instructed in medium to large repairs protocol in [place] See patient instructions    Recommended Other Services Occupational therapy if indicated    Consulted and Agree with Plan of Care Patient          G-Codes - 2014/10/17 0853    Functional Assessment Tool Used Clinical judgement    Functional Limitation Carrying, moving and handling objects   Carrying, Moving and Handling Objects Current Status [phone]) At least 80  percent but less than 100 percent impaired, limited or restricted   Carrying, Moving and Handling Objects Goal Status ([id number]) At least 1 percent but less than 20 percent impaired, limited or restricted       Problem List Patient Active Problem List   Diagnosis Date Noted  . Complete tear of right rotator cuff 09/19/2014  . Rupture of right biceps tendon 09/19/2014  . AC (acromioclavicular) arthritis 09/19/2014  . Impingement syndrome of right shoulder 09/19/2014  . Right rotator cuff tear 08/28/2014  . 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 Diarrhea 07/05/2014  . Osteopenia 08/23/2012  . Medicare annual wellness visit, subsequent 07/19/2012  . Screening for breast cancer 07/19/2012  . Postmenopausal estrogen  deficiency 07/19/2012  . Hypertension 10/27/2011  . DUB (dysfunctional uterine bleeding)   . Rosacea     Kerman Passey, PT, DPT   10/03/2014, 5:19 PM  [place] MAIN [facility] SERVICES [address] [place], [place], [address] Phone: [phone]   Fax:  [phone]

## 2014-10-03 NOTE — Patient Instructions (Signed)
All exercises provided were adapted from [email or web address]. Patient was provided a written handout with pictures as described. Any additional cues were manually entered in to handout and copied in to this document.   ISOMETRIC FLEXION (8 times, 3 second hold, 1 set, 1x per day)   Gently push your fist forward into a wall with your elbow bent.      ISOMETRIC ABDUCTION (8 times, 3 second hold, 1 set, 1x per day)   Gently push your elbow out to the side into a wall with your elbow bent.     ISOMETRIC EXTENSION (8 times, 3 second hold, 1 set, 1x per day)   Gently push your a bent elbow back into a wall.    ISOMETRIC INTERNAL ROTATION  (8 times, 3 second hold, 1 set, 1x per day)   Gently press your hand into a wall using the palm side of your hand.  Maintain a bent elbow the entire time.    ** Have a pillow or towel roll between your elbow and rib cage     ** Want to do this at the angle your arm is in the sling    ISOMETRIC EXTERNAL ROTATION (8 times, 3 second hold, 1 set, 1x per day)   Gently press your hand into a wall using the back side of your hand.  Maintain a bent elbow the entire time.        ** Have a pillow or towel roll between your elbow and rib cage   ** Want to do this at the angle your arm is in the sling   Isometric Elbow flex/ext (8 times, 3 second hold, 1 set, 1x per day)  Isometric Elbow Flex/Ext:  Can be done standing or sitting. Find a stable surface that allows you to push up into it and press down on it with your elbows at 90 degrees.   PENDULUM SHOULDER FORWARD/BACK (20 seconds, 1 set, 1x per day)  Shift your body weight forward then back to allow your injured arm to swing forward and back freely. Your injured arm should be fully relaxed.   PENDULUM SHOULDER CIRCLES (20 seconds, 1 set, 1x per day)  Shift your body weight in circles to allow your injured arm to swing in circles freely. Your injured arm should be fully relaxed.

## 2014-10-10 ENCOUNTER — Encounter: Payer: Self-pay | Admitting: Physical Therapy

## 2014-10-10 ENCOUNTER — Ambulatory Visit: Payer: Medicare Other | Admitting: Physical Therapy

## 2014-10-10 DIAGNOSIS — M25511 Pain in right shoulder: Secondary | ICD-10-CM

## 2014-10-10 DIAGNOSIS — Z9889 Other specified postprocedural states: Secondary | ICD-10-CM

## 2014-10-10 NOTE — Therapy (Signed)
[place] MAIN [facility] SERVICES [address] [place], [place], [address] Phone: [phone]   Fax:  [phone]  Physical Therapy Treatment  Patient Details  Name: Katherine Stanley MRN: [id number] Date of Birth: [birth year] Referring Provider:  Jackolyn Confer, MD  Encounter Date: 10/10/2014      PT End of Session - 10/10/14 1656    Visit Number 2   Number of Visits 17   Date for PT Re-Evaluation 12/12/14   Authorization Type 2/10   PT Start Time 0440   PT Stop Time 0510   PT Time Calculation (min) 30 min      Past Medical History  Diagnosis Date  . Rosacea   . Hypertension     takes Lisinopril daily  . Headache     occasionally  . Arthritis   . Joint pain   . Osteopenia   . Diverticulosis     Past Surgical History  Procedure Laterality Date  . Cholecystectomy    . Refractive surgery    . Cholecystectomy    . Tonsillectomy  at age 70  . Vaginal hysterectomy  at age 24  . Colonoscopy    . Shoulder arthroscopy with open rotator cuff repair and distal clavicle acrominectomy Right 09/19/2014    Procedure: SHOULDER ARTHROSCOPY WITH MINI-OPEN ROTATOR CUFF REPAIR AND DISTAL CLAVICLE RESECTION, SUBACROMIAL DECOMPRESSION;  Surgeon: Garald Balding, MD;  Location: [place];  Service: Orthopedics;  Laterality: Right;    There were no vitals filed for this visit.  Visit Diagnosis:  H/O repair of right rotator cuff  Right shoulder pain      Subjective Assessment - 10/10/14 1637    Subjective Patient says that she is wearing her sling and doing her isometric exericses at home.    Pertinent History Patient had a right rotator cuff repair performed on 09/19/2014.She had a full tear of a RC tendon and long head of the biceps.    Limitations Lifting;Writing;House hold activities   Patient Stated Goals To return to playing golf   Pain Score 0-No pain   Pain Location Shoulder   Pain Orientation Right     Therapeutic exercise:  PROM  to RUE in pain free range. Pendulum exercises RUE  Isometric exercises shoulder flex/IR/ ER/ext Scapula retraction with 5 sec hold Ice to right shoulder x 15 minutes                               PT Long Term Goals - 10/03/14 1255    PT LONG TERM GOAL #1   Title Patient will be independent with a home exercise program to increase strength, ROM, and decrease pain and increase function by 12/12/2014.   Status New   PT LONG TERM GOAL #2   Title Patient will display full pain free AROM of her RUE to display ability to complete ADLs by 12/12/2014.    Baseline Active assist or passive ROM only currently.    Status New   PT LONG TERM GOAL #3   Title Patient will report a Quick Dash score of at least 60/80 to demonstrate increased tolerance for ADLs and performance of functional activities by 12/12/2014.    Baseline 0 currently as she is only able to perform PROM and AAROM   Status New   PT LONG TERM GOAL #4   Title Patient will display at least 4/5 strength in all shoulder girdle musculature to demonstrate  increased strength and return to function by 12/12/2014.    Baseline Deferred testing at eval secondary to proximity to surgery.    Status New               Plan - 10/10/14 1657    Clinical Impression Statement Patient is wearing her sling and having no reprots of pain. She was seen for PROM and isometirc exercises of left sholder and throacic spine. Patient will continue to benefit from skilled PT to improve mobilty and strength.    Pt will benefit from skilled therapeutic intervention in order to improve on the following deficits Impaired UE functional use;Pain;Decreased range of motion;Decreased strength   Rehab Potential Good   Clinical Impairments Affecting Rehab Potential Surgical repair of right rotator cuff   PT Frequency 2x / week   PT Duration 8 weeks   PT Treatment/Interventions ADLs/Self Care Home Management;Aquatic  Therapy;Cryotherapy;Ultrasound;Dry needling;Passive range of motion;Neuromuscular re-education;Scar mobilization;Therapeutic exercise;Therapeutic activities;Electrical Stimulation;Manual techniques   PT Next Visit Plan Progress HEP as instructed in medium to large repairs protocol in [facility]         Problem List Patient Active Problem List   Diagnosis Date Noted  . Complete tear of right rotator cuff 09/19/2014  . Rupture of right biceps tendon 09/19/2014  . AC (acromioclavicular) arthritis 09/19/2014  . Impingement syndrome of right shoulder 09/19/2014  . Right rotator cuff tear 08/28/2014  . 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 Diarrhea 07/05/2014  . Osteopenia 08/23/2012  . Medicare annual wellness visit, subsequent 07/19/2012  . Screening for breast cancer 07/19/2012  . 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 Hypertension 10/27/2011  . DUB (dysfunctional uterine bleeding)   . Rosacea     Alanson Puls 10/10/2014, 5:00 PM  [place] [address] [place], [place], [address] Phone: [phone]   Fax:  [phone]

## 2014-10-12 ENCOUNTER — Ambulatory Visit: Payer: Medicare Other | Admitting: Physical Therapy

## 2014-10-12 ENCOUNTER — Encounter: Payer: Self-pay | Admitting: Physical Therapy

## 2014-10-12 DIAGNOSIS — M25511 Pain in right shoulder: Secondary | ICD-10-CM

## 2014-10-12 DIAGNOSIS — Z9889 Other specified postprocedural states: Secondary | ICD-10-CM

## 2014-10-12 NOTE — Therapy (Signed)
[place] MAIN [facility] SERVICES [address] [place], [place], [address] Phone: [phone]   Fax:  [phone]  Physical Therapy Treatment  Patient Details  Name: GERDA YIN MRN: [id number] Date of Birth: [birth year] Referring Provider:  Jackolyn Confer, MD  Encounter Date: 10/12/2014      PT End of Session - 10/12/14 0927    Visit Number 3   Number of Visits 17   Date for PT Re-Evaluation 12/12/14   Authorization Type 2/10   PT Start Time 0915   PT Stop Time 0955   PT Time Calculation (min) 40 min      Past Medical History  Diagnosis Date  . Rosacea   . Hypertension     takes Lisinopril daily  . Headache     occasionally  . Arthritis   . Joint pain   . Osteopenia   . Diverticulosis     Past Surgical History  Procedure Laterality Date  . Cholecystectomy    . Refractive surgery    . Cholecystectomy    . Tonsillectomy  at age 25  . Vaginal hysterectomy  at age 38  . Colonoscopy    . Shoulder arthroscopy with open rotator cuff repair and distal clavicle acrominectomy Right 09/19/2014    Procedure: SHOULDER ARTHROSCOPY WITH MINI-OPEN ROTATOR CUFF REPAIR AND DISTAL CLAVICLE RESECTION, SUBACROMIAL DECOMPRESSION;  Surgeon: Garald Balding, MD;  Location: [place];  Service: Orthopedics;  Laterality: Right;    There were no vitals filed for this visit.  Visit Diagnosis:  H/O repair of right rotator cuff  Right shoulder pain      Subjective Assessment - 10/12/14 0931    Subjective Pateint is having no pain in left shoulder.    Limitations Sitting   Patient Stated Goals To return to playing golf   Currently in Pain? No/denies      Therapeutic exercise left shoulder; PROM flex/ext/ER/IR AAROM bentch press with cane, horizontal abd with cane Scapula mobilization ant/post, med/lateral  scaoula retraction isometrics,  Shoulder ER isometrics, IR isometrics, abd isometrics, add isometrics, flex isometrics No reports  of pain prior or following therapy                           PT Education - 10/12/14 0927    Education provided Yes   Education Details Educated about wearing the sling   Person(s) Educated Patient   Methods Explanation   Comprehension Verbalized understanding             PT Long Term Goals - 10/03/14 1255    PT LONG TERM GOAL #1   Title Patient will be independent with a home exercise program to increase strength, ROM, and decrease pain and increase function by 12/12/2014.   Status New   PT LONG TERM GOAL #2   Title Patient will display full pain free AROM of her RUE to display ability to complete ADLs by 12/12/2014.    Baseline Active assist or passive ROM only currently.    Status New   PT LONG TERM GOAL #3   Title Patient will report a Quick Dash score of at least 60/80 to demonstrate increased tolerance for ADLs and performance of functional activities by 12/12/2014.    Baseline 0 currently as she is only able to perform PROM and AAROM   Status New   PT LONG TERM GOAL #4   Title Patient will display at least 4/5 strength in  all shoulder girdle musculature to demonstrate increased strength and return to function by 12/12/2014.    Baseline Deferred testing at eval secondary to proximity to surgery.    Status New               Plan - 10/12/14 0932    Clinical Impression Statement Pateint is wearing her sling and she sees the MD July 8th. She is doing her HEP and can be progressed as toelranted into AAROM and strengthening of RUE shoulder.    Pt will benefit from skilled therapeutic intervention in order to improve on the following deficits Impaired UE functional use;Pain;Decreased range of motion;Decreased strength   Rehab Potential Good   Clinical Impairments Affecting Rehab Potential Surgical repair of right rotator cuff   PT Frequency 2x / week   PT Duration 8 weeks   PT Treatment/Interventions ADLs/Self Care Home Management;Aquatic  Therapy;Cryotherapy;Ultrasound;Dry needling;Passive range of motion;Neuromuscular re-education;Scar mobilization;Therapeutic exercise;Therapeutic activities;Electrical Stimulation;Manual techniques   PT Next Visit Plan Progress HEP as instructed in medium to large repairs protocol in [place] See patient instructions    Consulted and Agree with Plan of Care Patient        Problem List Patient Active Problem List   Diagnosis Date Noted  . Complete tear of right rotator cuff 09/19/2014  . Rupture of right biceps tendon 09/19/2014  . AC (acromioclavicular) arthritis 09/19/2014  . Impingement syndrome of right shoulder 09/19/2014  . Right rotator cuff tear 08/28/2014  . 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 Diarrhea 07/05/2014  . Osteopenia 08/23/2012  . Medicare annual wellness visit, subsequent 07/19/2012  . Screening for breast cancer 07/19/2012  . 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 Hypertension 10/27/2011  . DUB (dysfunctional uterine bleeding)   . Rosacea     Alanson Puls 10/12/2014, 9:34 AM  [place] MAIN [facility] SERVICES [address] [place], [place], [address] Phone: [phone]   Fax:  [phone]

## 2014-10-17 ENCOUNTER — Ambulatory Visit: Payer: Medicare Other | Attending: Orthopaedic Surgery | Admitting: Physical Therapy

## 2014-10-17 ENCOUNTER — Encounter: Payer: Self-pay | Admitting: Physical Therapy

## 2014-10-17 DIAGNOSIS — Z9889 Other specified postprocedural states: Secondary | ICD-10-CM

## 2014-10-17 DIAGNOSIS — M25511 Pain in right shoulder: Secondary | ICD-10-CM | POA: Insufficient documentation

## 2014-10-17 NOTE — Therapy (Signed)
[place] MAIN [facility] SERVICES [address] [place], [place], [address] Phone: [phone]   Fax:  [phone]  Physical Therapy Treatment  Patient Details  Name: Katherine Stanley MRN: [id number] Date of Birth: Feb 06, 1946 Referring Provider:  Jackolyn Confer, MD  Encounter Date: 10/17/2014      PT End of Session - 10/17/14 1742    Visit Number 4   Number of Visits 17   Date for PT Re-Evaluation 12/12/14   Authorization Type 2/10   PT Start Time 0545   PT Stop Time 0625   PT Time Calculation (min) 40 min      Past Medical History  Diagnosis Date  . Rosacea   . Hypertension     takes Lisinopril daily  . Headache     occasionally  . Arthritis   . Joint pain   . Osteopenia   . Diverticulosis     Past Surgical History  Procedure Laterality Date  . Cholecystectomy    . Refractive surgery    . Cholecystectomy    . Tonsillectomy  at age 47  . Vaginal hysterectomy  at age 16  . Colonoscopy    . Shoulder arthroscopy with open rotator cuff repair and distal clavicle acrominectomy Right 09/19/2014    Procedure: SHOULDER ARTHROSCOPY WITH MINI-OPEN ROTATOR CUFF REPAIR AND DISTAL CLAVICLE RESECTION, SUBACROMIAL DECOMPRESSION;  Surgeon: Garald Balding, MD;  Location: [place];  Service: Orthopedics;  Laterality: Right;    There were no vitals filed for this visit.  Visit Diagnosis:  H/O repair of right rotator cuff  Right shoulder pain      Subjective Assessment - 10/17/14 1740    Subjective Pateint is having 1/10 pain in lright shoulder.    Pertinent History Patient had a right rotator cuff repair performed on 09/19/2014.She had a full tear of a RC tendon and long head of the biceps.    Limitations Sitting   Patient Stated Goals To return to playing golf   Pain Score 1    Pain Location Arm         Therapeutic exercise left shoulder; PROM flex/ext/ER/IR AAROM bentch press with cane, horizontal abd with cane Scapula  mobilization ant/post, med/lateral  scaoula retraction isometrics,  Shoulder ER isometrics, IR isometrics, abd isometrics, add isometrics, flex isometrics E-stim  IFC x 20 minutes to right shoulder No reports of pain prior or following therapy                         PT Education - 10/17/14 1741    Education provided Yes   Education Details Educated about restrictins   Person(s) Educated Patient   Methods Explanation   Comprehension Verbalized understanding             PT Long Term Goals - 10/03/14 1255    PT LONG TERM GOAL #1   Title Patient will be independent with a home exercise program to increase strength, ROM, and decrease pain and increase function by 12/12/2014.   Status New   PT LONG TERM GOAL #2   Title Patient will display full pain free AROM of her RUE to display ability to complete ADLs by 12/12/2014.    Baseline Active assist or passive ROM only currently.    Status New   PT LONG TERM GOAL #3   Title Patient will report a Quick Dash score of at least 60/80 to demonstrate increased tolerance for ADLs and performance of  functional activities by 12/12/2014.    Baseline 0 currently as she is only able to perform PROM and AAROM   Status New   PT LONG TERM GOAL #4   Title Patient will display at least 4/5 strength in all shoulder girdle musculature to demonstrate increased strength and return to function by 12/12/2014.    Baseline Deferred testing at eval secondary to proximity to surgery.    Status New               Plan - 10/17/14 1742    Clinical Impression Statement Pt reported abolishment of pain, but did report some discomfort over the weekend. Added e-stim and ice following exercise to right shoulder.    Pt will benefit from skilled therapeutic intervention in order to improve on the following deficits Impaired UE functional use;Pain;Decreased range of motion;Decreased strength   Rehab Potential Good   Clinical Impairments Affecting  Rehab Potential Surgical repair of right rotator cuff   PT Frequency 2x / week   PT Duration 8 weeks   PT Treatment/Interventions ADLs/Self Care Home Management;Aquatic Therapy;Cryotherapy;Ultrasound;Dry needling;Passive range of motion;Neuromuscular re-education;Scar mobilization;Therapeutic exercise;Therapeutic activities;Electrical Stimulation;Manual techniques   PT Next Visit Plan Progress HEP as instructed in medium to large repairs protocol in [place] See patient instructions    Consulted and Agree with Plan of Care Patient        Problem List Patient Active Problem List   Diagnosis Date Noted  . Complete tear of right rotator cuff 09/19/2014  . Rupture of right biceps tendon 09/19/2014  . AC (acromioclavicular) arthritis 09/19/2014  . Impingement syndrome of right shoulder 09/19/2014  . Right rotator cuff tear 08/28/2014  . 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 Diarrhea 07/05/2014  . Osteopenia 08/23/2012  . Medicare annual wellness visit, subsequent 07/19/2012  . Screening for breast cancer 07/19/2012  . 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 Hypertension 10/27/2011  . DUB (dysfunctional uterine bleeding)   . Rosacea     Alanson Puls 10/17/2014, 5:55 PM  [place] MAIN [facility] SERVICES [address] [place], [place], [address] Phone: [phone]   Fax:  [phone]

## 2014-10-24 ENCOUNTER — Encounter: Payer: Self-pay | Admitting: Physical Therapy

## 2014-10-24 ENCOUNTER — Ambulatory Visit: Payer: Medicare Other | Admitting: Physical Therapy

## 2014-10-24 DIAGNOSIS — M25511 Pain in right shoulder: Secondary | ICD-10-CM | POA: Diagnosis not present

## 2014-10-24 DIAGNOSIS — Z9889 Other specified postprocedural states: Secondary | ICD-10-CM

## 2014-10-24 NOTE — Therapy (Signed)
[place] MAIN [facility] SERVICES [address] [place], [place], [address] Phone: [phone]   Fax:  [phone]  Physical Therapy Treatment  Patient Details  Name: Katherine Stanley MRN: [id number] Date of Birth: 04-07-1946 Referring Provider:  Jackolyn Confer, MD  Encounter Date: 10/24/2014      PT End of Session - 10/24/14 1615    Visit Number 5   Number of Visits 17   Date for PT Re-Evaluation 12/12/14   Authorization Type 2/10      Past Medical History  Diagnosis Date  . Rosacea   . Hypertension     takes Lisinopril daily  . Headache     occasionally  . Arthritis   . Joint pain   . Osteopenia   . Diverticulosis     Past Surgical History  Procedure Laterality Date  . Cholecystectomy    . Refractive surgery    . Cholecystectomy    . Tonsillectomy  at age 35  . Vaginal hysterectomy  at age 65  . Colonoscopy    . Shoulder arthroscopy with open rotator cuff repair and distal clavicle acrominectomy Right 09/19/2014    Procedure: SHOULDER ARTHROSCOPY WITH MINI-OPEN ROTATOR CUFF REPAIR AND DISTAL CLAVICLE RESECTION, SUBACROMIAL DECOMPRESSION;  Surgeon: Garald Balding, MD;  Location: [place];  Service: Orthopedics;  Laterality: Right;    There were no vitals filed for this visit.  Visit Diagnosis:  Right shoulder pain  H/O repair of right rotator cuff      Subjective Assessment - 10/24/14 1613    Subjective Pateint is having 0/10 pain in lright shoulder.    Pertinent History Patient had a right rotator cuff repair performed on 09/19/2014.She had a full tear of a RC tendon and long head of the biceps.    Limitations Sitting   Patient Stated Goals To return to playing golf   Pain Score 0-No pain       PROM to right shoulder, isometrics for shoulder flex/ext/ER/IR E-stim IFC x 20 minutes Good tolerance with no reports of pain.                          PT Education - 10/24/14 1614    Education  provided Yes   Education Details educated about restrictions   Person(s) Educated Patient   Methods Explanation   Comprehension Verbalized understanding             PT Long Term Goals - 10/03/14 1255    PT LONG TERM GOAL #1   Title Patient will be independent with a home exercise program to increase strength, ROM, and decrease pain and increase function by 12/12/2014.   Status New   PT LONG TERM GOAL #2   Title Patient will display full pain free AROM of her RUE to display ability to complete ADLs by 12/12/2014.    Baseline Active assist or passive ROM only currently.    Status New   PT LONG TERM GOAL #3   Title Patient will report a Quick Dash score of at least 60/80 to demonstrate increased tolerance for ADLs and performance of functional activities by 12/12/2014.    Baseline 0 currently as she is only able to perform PROM and AAROM   Status New   PT LONG TERM GOAL #4   Title Patient will display at least 4/5 strength in all shoulder girdle musculature to demonstrate increased strength and return to function by 12/12/2014.  Baseline Deferred testing at eval secondary to proximity to surgery.    Status New               Plan - 10/24/14 1615    Clinical Impression Statement Patient is not having any pain today and is continuing to wear her sling. She has full PROM and is independent with isometric exercises and pendulum.    Pt will benefit from skilled therapeutic intervention in order to improve on the following deficits Impaired UE functional use;Pain;Decreased range of motion;Decreased strength   Rehab Potential Good   Clinical Impairments Affecting Rehab Potential Surgical repair of right rotator cuff   PT Frequency 2x / week   PT Duration 8 weeks   PT Treatment/Interventions ADLs/Self Care Home Management;Aquatic Therapy;Cryotherapy;Ultrasound;Dry needling;Passive range of motion;Neuromuscular re-education;Scar mobilization;Therapeutic exercise;Therapeutic  activities;Electrical Stimulation;Manual techniques   PT Next Visit Plan Progress HEP as instructed in medium to large repairs protocol in [place] See patient instructions    Consulted and Agree with Plan of Care Patient        Problem List Patient Active Problem List   Diagnosis Date Noted  . Complete tear of right rotator cuff 09/19/2014  . Rupture of right biceps tendon 09/19/2014  . AC (acromioclavicular) arthritis 09/19/2014  . Impingement syndrome of right shoulder 09/19/2014  . Right rotator cuff tear 08/28/2014  . 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 Diarrhea 07/05/2014  . Osteopenia 08/23/2012  . Medicare annual wellness visit, subsequent 07/19/2012  . Screening for breast cancer 07/19/2012  . 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 Hypertension 10/27/2011  . DUB (dysfunctional uterine bleeding)   . Rosacea     Alanson Puls 10/24/2014, 4:19 PM  [place] MAIN [facility] SERVICES [address] [place], [place], [address] Phone: [phone]   Fax:  [phone]

## 2014-10-26 ENCOUNTER — Encounter: Payer: Self-pay | Admitting: Physical Therapy

## 2014-10-26 ENCOUNTER — Ambulatory Visit: Payer: Medicare Other | Admitting: Physical Therapy

## 2014-10-26 DIAGNOSIS — M25511 Pain in right shoulder: Secondary | ICD-10-CM | POA: Diagnosis not present

## 2014-10-26 DIAGNOSIS — Z9889 Other specified postprocedural states: Secondary | ICD-10-CM | POA: Diagnosis not present

## 2014-10-26 NOTE — Therapy (Signed)
[place] MAIN [facility] SERVICES [address] [place], [place], [address] Phone: [phone]   Fax:  [phone]  Physical Therapy Treatment  Patient Details  Name: Katherine Stanley MRN: [id number] Date of Birth: 1946-04-03 Referring Provider:  Jackolyn Confer, MD  Encounter Date: 10/26/2014      PT End of Session - 10/26/14 1607    Visit Number 6   Number of Visits 17   Date for PT Re-Evaluation 12/12/14   Authorization Type 2/10      Past Medical History  Diagnosis Date  . Rosacea   . Hypertension     takes Lisinopril daily  . Headache     occasionally  . Arthritis   . Joint pain   . Osteopenia   . Diverticulosis     Past Surgical History  Procedure Laterality Date  . Cholecystectomy    . Refractive surgery    . Cholecystectomy    . Tonsillectomy  at age 61  . Vaginal hysterectomy  at age 36  . Colonoscopy    . Shoulder arthroscopy with open rotator cuff repair and distal clavicle acrominectomy Right 09/19/2014    Procedure: SHOULDER ARTHROSCOPY WITH MINI-OPEN ROTATOR CUFF REPAIR AND DISTAL CLAVICLE RESECTION, SUBACROMIAL DECOMPRESSION;  Surgeon: Garald Balding, MD;  Location: [place];  Service: Orthopedics;  Laterality: Right;    There were no vitals filed for this visit.  Visit Diagnosis:  Right shoulder pain  H/O repair of right rotator cuff      Subjective Assessment - 10/26/14 1606    Subjective Pateint is having 0/10 pain in lright shoulder.    Pertinent History Patient had a right rotator cuff repair performed on 09/19/2014.She had a full tear of a RC tendon and long head of the biceps.    Limitations Sitting   Patient Stated Goals To return to playing golf   Currently in Pain? No/denies          Therapeutic exercise left shoulder; PROM flex/ext/ER/IR AAROM bentch press with cane, horizontal abd with cane Scapula mobilization ant/post, med/lateral  [place] retraction isometrics,  Shoulder ER  isometrics, IR isometrics, abd isometrics, add isometrics, flex isometrics E-stim IFC x 20 minutes to right shoulder No reports of pain prior or following therapy                          PT Education - 10/26/14 1606    Education provided Yes   Education Details Educated about restrictions   Person(s) Educated Patient   Methods Explanation   Comprehension Verbalized understanding             PT Long Term Goals - 10/03/14 1255    PT LONG TERM GOAL #1   Title Patient will be independent with a home exercise program to increase strength, ROM, and decrease pain and increase function by 12/12/2014.   Status New   PT LONG TERM GOAL #2   Title Patient will display full pain free AROM of her RUE to display ability to complete ADLs by 12/12/2014.    Baseline Active assist or passive ROM only currently.    Status New   PT LONG TERM GOAL #3   Title Patient will report a Quick Dash score of at least 60/80 to demonstrate increased tolerance for ADLs and performance of functional activities by 12/12/2014.    Baseline 0 currently as she is only able to perform PROM and AAROM   Status New  PT LONG TERM GOAL #4   Title Patient will display at least 4/5 strength in all shoulder girdle musculature to demonstrate increased strength and return to function by 12/12/2014.    Baseline Deferred testing at eval secondary to proximity to surgery.    Status New               Plan - 10/26/14 1607    Clinical Impression Statement patient is not having any pain today and is able to perform isometrics and PROM to left shoulder.    Pt will benefit from skilled therapeutic intervention in order to improve on the following deficits Impaired UE functional use;Pain;Decreased range of motion;Decreased strength   Rehab Potential Good   Clinical Impairments Affecting Rehab Potential Surgical repair of right rotator cuff   PT Frequency 2x / week   PT Duration 8 weeks   PT  Treatment/Interventions ADLs/Self Care Home Management;Aquatic Therapy;Cryotherapy;Ultrasound;Dry needling;Passive range of motion;Neuromuscular re-education;Scar mobilization;Therapeutic exercise;Therapeutic activities;Electrical Stimulation;Manual techniques   PT Next Visit Plan Progress HEP as instructed in medium to large repairs protocol in [place] See patient instructions    Consulted and Agree with Plan of Care Patient        Problem List Patient Active Problem List   Diagnosis Date Noted  . Complete tear of right rotator cuff 09/19/2014  . Rupture of right biceps tendon 09/19/2014  . AC (acromioclavicular) arthritis 09/19/2014  . Impingement syndrome of right shoulder 09/19/2014  . Right rotator cuff tear 08/28/2014  . 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 Diarrhea 07/05/2014  . Osteopenia 08/23/2012  . Medicare annual wellness visit, subsequent 07/19/2012  . Screening for breast cancer 07/19/2012  . 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 Hypertension 10/27/2011  . DUB (dysfunctional uterine bleeding)   . Rosacea     Alanson Puls 10/26/2014, 4:16 PM  [place] MAIN [facility] SERVICES [address] [place], [place], [address] Phone: [phone]   Fax:  [phone]

## 2014-10-31 ENCOUNTER — Ambulatory Visit: Payer: Medicare Other | Admitting: Physical Therapy

## 2014-10-31 ENCOUNTER — Encounter: Payer: Self-pay | Admitting: Physical Therapy

## 2014-10-31 DIAGNOSIS — M25511 Pain in right shoulder: Secondary | ICD-10-CM | POA: Diagnosis not present

## 2014-10-31 DIAGNOSIS — Z9889 Other specified postprocedural states: Secondary | ICD-10-CM

## 2014-10-31 NOTE — Therapy (Signed)
[place] MAIN [facility] SERVICES [address] [place], [place], [address] Phone: [phone]   Fax:  [phone]  Physical Therapy Treatment  Patient Details  Name: Katherine Stanley MRN: [id number] Date of Birth: 01/16/1946 Referring Provider:  Jackolyn Confer, MD  Encounter Date: 10/31/2014      PT End of Session - 10/31/14 0845    Visit Number 7   Number of Visits 17   Date for PT Re-Evaluation 12/12/14   Authorization Type 2/10   PT Start Time 0830   PT Stop Time 0915   PT Time Calculation (min) 45 min   Activity Tolerance Patient tolerated treatment well   Behavior During Therapy [facility] for tasks assessed/performed      Past Medical History  Diagnosis Date  . Rosacea   . Hypertension     takes Lisinopril daily  . Headache     occasionally  . Arthritis   . Joint pain   . Osteopenia   . Diverticulosis     Past Surgical History  Procedure Laterality Date  . Cholecystectomy    . Refractive surgery    . Cholecystectomy    . Tonsillectomy  at age 51  . Vaginal hysterectomy  at age 49  . Colonoscopy    . Shoulder arthroscopy with open rotator cuff repair and distal clavicle acrominectomy Right 09/19/2014    Procedure: SHOULDER ARTHROSCOPY WITH MINI-OPEN ROTATOR CUFF REPAIR AND DISTAL CLAVICLE RESECTION, SUBACROMIAL DECOMPRESSION;  Surgeon: Garald Balding, MD;  Location: [facility];  Service: Orthopedics;  Laterality: Right;    There were no vitals filed for this visit.  Visit Diagnosis:  Right shoulder pain  H/O repair of right rotator cuff      Subjective Assessment - 10/31/14 0844    Subjective Pateint is having some tenderness today and she woke up wiht her arm in a position over her head. She put the ice on her shoulder.    Pertinent History Patient had a right rotator cuff repair performed on 09/19/2014.She had a full tear of a RC tendon and long head of the biceps.    Limitations Sitting   Patient Stated Goals To  return to playing golf   Currently in Pain? No/denies      Therapeutic exercise: PROM right shoulder flex, abd, ER, IR E-stim IFC  Crossed pattern to right shoulder x 20 minutes, ice to right shoulder. Patient has full ROM with no pain symptoms                           PT Education - 10/31/14 0845    Education provided Yes   Education Details Education about HEP   Person(s) Educated Patient   Methods Explanation   Comprehension Verbalized understanding             PT Long Term Goals - 10/03/14 1255    PT LONG TERM GOAL #1   Title Patient will be independent with a home exercise program to increase strength, ROM, and decrease pain and increase function by 12/12/2014.   Status New   PT LONG TERM GOAL #2   Title Patient will display full pain free AROM of her RUE to display ability to complete ADLs by 12/12/2014.    Baseline Active assist or passive ROM only currently.    Status New   PT LONG TERM GOAL #3   Title Patient will report a Quick Dash score of at least 60/80  to demonstrate increased tolerance for ADLs and performance of functional activities by 12/12/2014.    Baseline 0 currently as she is only able to perform PROM and AAROM   Status New   PT LONG TERM GOAL #4   Title Patient will display at least 4/5 strength in all shoulder girdle musculature to demonstrate increased strength and return to function by 12/12/2014.    Baseline Deferred testing at eval secondary to proximity to surgery.    Status New               Plan - 10/31/14 0846    Clinical Impression Statement Patient has no pain with exercises and sees the MD friday.    Pt will benefit from skilled therapeutic intervention in order to improve on the following deficits Impaired UE functional use;Pain;Decreased range of motion;Decreased strength   Rehab Potential Good   Clinical Impairments Affecting Rehab Potential Surgical repair of right rotator cuff   PT Frequency 2x / week    PT Duration 8 weeks   PT Treatment/Interventions ADLs/Self Care Home Management;Aquatic Therapy;Cryotherapy;Ultrasound;Dry needling;Passive range of motion;Neuromuscular re-education;Scar mobilization;Therapeutic exercise;Therapeutic activities;Electrical Stimulation;Manual techniques   PT Next Visit Plan Progress HEP as instructed in medium to large repairs protocol in [place] See patient instructions    Consulted and Agree with Plan of Care Patient        Problem List Patient Active Problem List   Diagnosis Date Noted  . Complete tear of right rotator cuff 09/19/2014  . Rupture of right biceps tendon 09/19/2014  . AC (acromioclavicular) arthritis 09/19/2014  . Impingement syndrome of right shoulder 09/19/2014  . Right rotator cuff tear 08/28/2014  . 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 Diarrhea 07/05/2014  . Osteopenia 08/23/2012  . Medicare annual wellness visit, subsequent 07/19/2012  . Screening for breast cancer 07/19/2012  . 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 Hypertension 10/27/2011  . DUB (dysfunctional uterine bleeding)   . Rosacea     Alanson Puls 10/31/2014, 8:49 AM  [place] MAIN [facility] SERVICES [address] [place], [place], [address] Phone: [phone]   Fax:  [phone]

## 2014-11-02 ENCOUNTER — Ambulatory Visit: Payer: Medicare Other | Admitting: Physical Therapy

## 2014-11-02 ENCOUNTER — Encounter: Payer: Self-pay | Admitting: Physical Therapy

## 2014-11-02 DIAGNOSIS — M25511 Pain in right shoulder: Secondary | ICD-10-CM

## 2014-11-02 DIAGNOSIS — Z9889 Other specified postprocedural states: Secondary | ICD-10-CM

## 2014-11-02 NOTE — Therapy (Signed)
[place] MAIN [facility] SERVICES [address] [place], [place], [address] Phone: [phone]   Fax:  [phone]  Physical Therapy Treatment  Patient Details  Name: Katherine Stanley MRN: [id number] Date of Birth: 1945/06/08 Referring Provider:  Jackolyn Confer, MD  Encounter Date: 11/02/2014      PT End of Session - 11/02/14 0844    Visit Number 8   Number of Visits 17   Date for PT Re-Evaluation 12/12/14   Authorization Type 2/10   PT Start Time 0830   PT Stop Time 0915   PT Time Calculation (min) 45 min   Activity Tolerance Patient tolerated treatment well   Behavior During Therapy [facility] for tasks assessed/performed      Past Medical History  Diagnosis Date  . Rosacea   . Hypertension     takes Lisinopril daily  . Headache     occasionally  . Arthritis   . Joint pain   . Osteopenia   . Diverticulosis     Past Surgical History  Procedure Laterality Date  . Cholecystectomy    . Refractive surgery    . Cholecystectomy    . Tonsillectomy  at age 71  . Vaginal hysterectomy  at age 38  . Colonoscopy    . Shoulder arthroscopy with open rotator cuff repair and distal clavicle acrominectomy Right 09/19/2014    Procedure: SHOULDER ARTHROSCOPY WITH MINI-OPEN ROTATOR CUFF REPAIR AND DISTAL CLAVICLE RESECTION, SUBACROMIAL DECOMPRESSION;  Surgeon: Garald Balding, MD;  Location: [facility];  Service: Orthopedics;  Laterality: Right;    There were no vitals filed for this visit.  Visit Diagnosis:  Right shoulder pain  H/O repair of right rotator cuff      Subjective Assessment - 11/02/14 0843    Subjective Pateint is having some tenderness today .   Pertinent History Patient had a right rotator cuff repair performed on 09/19/2014.She had a full tear of a RC tendon and long head of the biceps.    Limitations Sitting   Patient Stated Goals To return to playing golf   Currently in Pain? No/denies       Therapeutic  exercise: Scapula mobilization, thoracic PA x 10 T 1-10 PROM RUE shoulder flex/abd/ER/IR x 10 x 2 AAROM bench press with cane, protraction  And flex 0-90 with cane x 10 Good tolerance with no reports of pain                          PT Education - 11/02/14 0844    Education provided Yes   Education Details Education of HEP   Person(s) Educated Patient   Methods Explanation   Comprehension Verbalized understanding             PT Long Term Goals - 10/03/14 1255    PT LONG TERM GOAL #1   Title Patient will be independent with a home exercise program to increase strength, ROM, and decrease pain and increase function by 12/12/2014.   Status New   PT LONG TERM GOAL #2   Title Patient will display full pain free AROM of her RUE to display ability to complete ADLs by 12/12/2014.    Baseline Active assist or passive ROM only currently.    Status New   PT LONG TERM GOAL #3   Title Patient will report a Quick Dash score of at least 60/80 to demonstrate increased tolerance for ADLs and performance of functional activities by 12/12/2014.  Baseline 0 currently as she is only able to perform PROM and AAROM   Status New   PT LONG TERM GOAL #4   Title Patient will display at least 4/5 strength in all shoulder girdle musculature to demonstrate increased strength and return to function by 12/12/2014.    Baseline Deferred testing at eval secondary to proximity to surgery.    Status New               Plan - 11/02/14 0845    Clinical Impression Statement Patient sees the MD tomorrow and is doing the exercises.    Pt will benefit from skilled therapeutic intervention in order to improve on the following deficits Impaired UE functional use;Pain;Decreased range of motion;Decreased strength   Rehab Potential Good   Clinical Impairments Affecting Rehab Potential Surgical repair of right rotator cuff   PT Frequency 2x / week   PT Duration 8 weeks   PT  Treatment/Interventions ADLs/Self Care Home Management;Aquatic Therapy;Cryotherapy;Ultrasound;Dry needling;Passive range of motion;Neuromuscular re-education;Scar mobilization;Therapeutic exercise;Therapeutic activities;Electrical Stimulation;Manual techniques   PT Next Visit Plan Progress HEP as instructed in medium to large repairs protocol in [place] See patient instructions    Consulted and Agree with Plan of Care Patient        Problem List Patient Active Problem List   Diagnosis Date Noted  . Complete tear of right rotator cuff 09/19/2014  . Rupture of right biceps tendon 09/19/2014  . AC (acromioclavicular) arthritis 09/19/2014  . Impingement syndrome of right shoulder 09/19/2014  . Right rotator cuff tear 08/28/2014  . 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 Diarrhea 07/05/2014  . Osteopenia 08/23/2012  . Medicare annual wellness visit, subsequent 07/19/2012  . Screening for breast cancer 07/19/2012  . 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 Hypertension 10/27/2011  . DUB (dysfunctional uterine bleeding)   . Rosacea     Alanson Puls 11/02/2014, 8:58 AM  [place] MAIN [facility] SERVICES [address] [place], [place], [address] Phone: [phone]   Fax:  [phone]

## 2014-11-07 ENCOUNTER — Ambulatory Visit: Payer: Medicare Other | Admitting: Physical Therapy

## 2014-11-07 DIAGNOSIS — M25511 Pain in right shoulder: Secondary | ICD-10-CM | POA: Diagnosis not present

## 2014-11-07 DIAGNOSIS — Z9889 Other specified postprocedural states: Secondary | ICD-10-CM

## 2014-11-07 NOTE — Therapy (Signed)
[place] MAIN [facility] SERVICES [address] [place], [place], [address] Phone: [phone]   Fax:  [phone]  Physical Therapy Treatment  Patient Details  Name: Katherine Stanley MRN: [id number] Date of Birth: August 11, 1945 Referring Provider:  Jackolyn Confer, MD  Encounter Date: 11/07/2014    Past Medical History  Diagnosis Date  . Rosacea   . Hypertension     takes Lisinopril daily  . Headache     occasionally  . Arthritis   . Joint pain   . Osteopenia   . Diverticulosis     Past Surgical History  Procedure Laterality Date  . Cholecystectomy    . Refractive surgery    . Cholecystectomy    . Tonsillectomy  at age 76  . Vaginal hysterectomy  at age 3  . Colonoscopy    . Shoulder arthroscopy with open rotator cuff repair and distal clavicle acrominectomy Right 09/19/2014    Procedure: SHOULDER ARTHROSCOPY WITH MINI-OPEN ROTATOR CUFF REPAIR AND DISTAL CLAVICLE RESECTION, SUBACROMIAL DECOMPRESSION;  Surgeon: Garald Balding, MD;  Location: [facility];  Service: Orthopedics;  Laterality: Right;    There were no vitals filed for this visit.  Visit Diagnosis:  No diagnosis found.      Subjective Assessment - 11/07/14 0910    Subjective "I'm not really having any pain.  I can tell it's there, but there isn't really pain.  I'm doing great. I'm so happy with my progress."   Currently in Pain? No/denies         Manual Therapy: Manual PROM and AAROM to R shoulder in all planes in supine.  Therapeutic Ex: AAROM wand ex's in supine (flexion, chest press, ER - all 2x20); scap punches at 90 deg flexion in supine 2x10; sword pull diagonals in supine with assist from PT 2x10; prone shldr extension and scap pinches with R UE 2x10 each.   Assessment/Clinical Impression: Pt is making excellent progress in PT.  She has very little subjective complaints and is compliant with HEP.   Plan: Continue within specifications of post surgical  protocol at next visit.                             PT Long Term Goals - 10/03/14 1255    PT LONG TERM GOAL #1   Title Patient will be independent with a home exercise program to increase strength, ROM, and decrease pain and increase function by 12/12/2014.   Status New   PT LONG TERM GOAL #2   Title Patient will display full pain free AROM of her RUE to display ability to complete ADLs by 12/12/2014.    Baseline Active assist or passive ROM only currently.    Status New   PT LONG TERM GOAL #3   Title Patient will report a Quick Dash score of at least 60/80 to demonstrate increased tolerance for ADLs and performance of functional activities by 12/12/2014.    Baseline 0 currently as she is only able to perform PROM and AAROM   Status New   PT LONG TERM GOAL #4   Title Patient will display at least 4/5 strength in all shoulder girdle musculature to demonstrate increased strength and return to function by 12/12/2014.    Baseline Deferred testing at eval secondary to proximity to surgery.    Status New               Problem List Patient Active  Problem List   Diagnosis Date Noted  . Complete tear of right rotator cuff 09/19/2014  . Rupture of right biceps tendon 09/19/2014  . AC (acromioclavicular) arthritis 09/19/2014  . Impingement syndrome of right shoulder 09/19/2014  . Right rotator cuff tear 08/28/2014  . 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 Diarrhea 07/05/2014  . Osteopenia 08/23/2012  . Medicare annual wellness visit, subsequent 07/19/2012  . Screening for breast cancer 07/19/2012  . 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 Hypertension 10/27/2011  . DUB (dysfunctional uterine bleeding)   . Rosacea     Ysabela Keisler , MPT  11/07/2014, 9:11 AM  [place] MAIN [facility] SERVICES [address] [place], [place], [address] Phone: [phone]   Fax:  [phone]

## 2014-11-09 ENCOUNTER — Encounter: Payer: Self-pay | Admitting: Physical Therapy

## 2014-11-09 ENCOUNTER — Ambulatory Visit: Payer: Medicare Other | Admitting: Physical Therapy

## 2014-11-09 DIAGNOSIS — Z9889 Other specified postprocedural states: Secondary | ICD-10-CM | POA: Diagnosis not present

## 2014-11-09 DIAGNOSIS — M25511 Pain in right shoulder: Secondary | ICD-10-CM | POA: Diagnosis not present

## 2014-11-09 NOTE — Therapy (Signed)
[place] MAIN [facility] SERVICES [address] [place], [place], [address] Phone: [phone]   Fax:  [phone]  Physical Therapy Treatment  Patient Details  Name: Katherine Stanley MRN: [id number] Date of Birth: [birth year] Referring Provider:  Garald Balding, MD  Encounter Date: 11/09/2014      PT End of Session - 11/09/14 0937    PT Start Time 0830   PT Stop Time 0915   PT Time Calculation (min) 45 min      Past Medical History  Diagnosis Date  . Rosacea   . Hypertension     takes Lisinopril daily  . Headache     occasionally  . Arthritis   . Joint pain   . Osteopenia   . Diverticulosis     Past Surgical History  Procedure Laterality Date  . Cholecystectomy    . Refractive surgery    . Cholecystectomy    . Tonsillectomy  at age 7  . Vaginal hysterectomy  at age 27  . Colonoscopy    . Shoulder arthroscopy with open rotator cuff repair and distal clavicle acrominectomy Right 09/19/2014    Procedure: SHOULDER ARTHROSCOPY WITH MINI-OPEN ROTATOR CUFF REPAIR AND DISTAL CLAVICLE RESECTION, SUBACROMIAL DECOMPRESSION;  Surgeon: Garald Balding, MD;  Location: [facility];  Service: Orthopedics;  Laterality: Right;    There were no vitals filed for this visit.  Visit Diagnosis:  H/O repair of right rotator cuff  Right shoulder pain      Subjective Assessment - 11/09/14 0913    Subjective Patient is having some soreness in her neck.    Limitations Sitting   Patient Stated Goals To return to playing golf        Therapeutic exercise including: Sidelying AAROM and AROM flex, ER without weight and with 1 # Supine flex AAROM and AROM and with 1 # x 20 Protraction with 1 # AROM supine abd 20 reps, 1# 20 reps UE support ranger with circles and horizontal abd/add Finger ladder flex, and abd AROM with theraball circles around the door E-stim to upper traps IFC x 20 minutes Ice to right  shouder                         PT Education - 11/09/14 0914    Education provided Yes   Methods Explanation   Comprehension Verbalized understanding             PT Long Term Goals - 10/03/14 1255    PT LONG TERM GOAL #1   Title Patient will be independent with a home exercise program to increase strength, ROM, and decrease pain and increase function by 12/12/2014.   Status New   PT LONG TERM GOAL #2   Title Patient will display full pain free AROM of her RUE to display ability to complete ADLs by 12/12/2014.    Baseline Active assist or passive ROM only currently.    Status New   PT LONG TERM GOAL #3   Title Patient will report a Quick Dash score of at least 60/80 to demonstrate increased tolerance for ADLs and performance of functional activities by 12/12/2014.    Baseline 0 currently as she is only able to perform PROM and AAROM   Status New   PT LONG TERM GOAL #4   Title Patient will display at least 4/5 strength in all shoulder girdle musculature to demonstrate increased strength and return to function by 12/12/2014.  Baseline Deferred testing at eval secondary to proximity to surgery.    Status New               Plan - 11-25-2014 4627    Clinical Impression Statement Patient is now able to perform AAROM and AROM to tolerance.    Pt will benefit from skilled therapeutic intervention in order to improve on the following deficits Impaired UE functional use;Pain;Decreased range of motion;Decreased strength   Rehab Potential Good   Clinical Impairments Affecting Rehab Potential Surgical repair of right rotator cuff   PT Frequency 2x / week   PT Duration 8 weeks   PT Treatment/Interventions ADLs/Self Care Home Management;Aquatic Therapy;Cryotherapy;Ultrasound;Dry needling;Passive range of motion;Neuromuscular re-education;Scar mobilization;Therapeutic exercise;Therapeutic activities;Electrical Stimulation;Manual techniques   PT Next Visit Plan Progress  HEP as instructed in medium to large repairs protocol in [facility]           G-Codes - 11-25-2014 0916    Functional Assessment Tool Used ROM and clinical judgement   Functional Limitation Carrying, moving and handling objects   Carrying, Moving and Handling Objects Current Status (O3500) At least 60 percent but less than 80 percent impaired, limited or restricted   Carrying, Moving and Handling Objects Goal Status (X3818) At least 1 percent but less than 20 percent impaired, limited or restricted      Problem List Patient Active Problem List   Diagnosis Date Noted  . Complete tear of right rotator cuff 09/19/2014  . Rupture of right biceps tendon 09/19/2014  . AC (acromioclavicular) arthritis 09/19/2014  . Impingement syndrome of right shoulder 09/19/2014  . Right rotator cuff tear 08/28/2014  . 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 Diarrhea 07/05/2014  . Osteopenia 08/23/2012  . Medicare annual wellness visit, subsequent 07/19/2012  . Screening for breast cancer 07/19/2012  . 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 Hypertension 10/27/2011  . DUB (dysfunctional uterine bleeding)   . Rosacea     Alanson Puls 11/25/2014, 9:38 AM  [place] MAIN [facility] SERVICES [address] [place], [place], [address] Phone: [phone]   Fax:  [phone]

## 2014-11-14 ENCOUNTER — Encounter: Payer: Self-pay | Admitting: Physical Therapy

## 2014-11-14 ENCOUNTER — Ambulatory Visit: Payer: Medicare Other | Attending: Orthopaedic Surgery | Admitting: Physical Therapy

## 2014-11-14 DIAGNOSIS — Z9889 Other specified postprocedural states: Secondary | ICD-10-CM

## 2014-11-14 DIAGNOSIS — M25511 Pain in right shoulder: Secondary | ICD-10-CM | POA: Diagnosis not present

## 2014-11-14 NOTE — Therapy (Signed)
[place] MAIN [facility] SERVICES [address] [place], [place], [address] Phone: [phone]   Fax:  [phone]  Physical Therapy Treatment  Patient Details  Name: Katherine Stanley MRN: [id number] Date of Birth: April 17, 1945 Referring Provider:  Garald Balding, MD  Encounter Date: 11/14/2014      PT End of Session - 11/14/14 0837    Visit Number 11   PT Start Time 0830   PT Stop Time 0915   PT Time Calculation (min) 45 min   Activity Tolerance Patient tolerated treatment well   Behavior During Therapy [facility] for tasks assessed/performed      Past Medical History  Diagnosis Date  . Rosacea   . Hypertension     takes Lisinopril daily  . Headache     occasionally  . Arthritis   . Joint pain   . Osteopenia   . Diverticulosis     Past Surgical History  Procedure Laterality Date  . Cholecystectomy    . Refractive surgery    . Cholecystectomy    . Tonsillectomy  at age 41  . Vaginal hysterectomy  at age 78  . Colonoscopy    . Shoulder arthroscopy with open rotator cuff repair and distal clavicle acrominectomy Right 09/19/2014    Procedure: SHOULDER ARTHROSCOPY WITH MINI-OPEN ROTATOR CUFF REPAIR AND DISTAL CLAVICLE RESECTION, SUBACROMIAL DECOMPRESSION;  Surgeon: Garald Balding, MD;  Location: [place];  Service: Orthopedics;  Laterality: Right;    There were no vitals filed for this visit.  Visit Diagnosis:  H/O repair of right rotator cuff  Right shoulder pain      Subjective Assessment - 11/14/14 0837    Subjective Patient is having some soreness in her neck.    Pertinent History Patient had a right rotator cuff repair performed on 09/19/2014.She had a full tear of a RC tendon and long head of the biceps.    Limitations Sitting   Patient Stated Goals To return to playing golf   Currently in Pain? No/denies            Therapeutic exercise including:  Sidelying AAROM and AROM flex, ER without weight and with 1 #, abd  with elbow straight x 20 Prone retraction with 3 # x 20 x 2 Supine flex AAROM and AROM and with 1 # x 20, 2 # x 20Protraction with 1 # AROM supine abd 20 reps, 1# 20 reps UE support ranger with circles and horizontal abd/add Finger ladder flex, and abd AROM with theraball circles around the door x 20 ROM RUE is WFL no pain Prone Y , T, exercise no weight x 20 Ice to right shouder                      PT Education - 11/14/14 0837    Education Details HEP   Person(s) Educated Patient   Methods Explanation   Comprehension Verbalized understanding             PT Long Term Goals - 10/03/14 1255    PT LONG TERM GOAL #1   Title Patient will be independent with a home exercise program to increase strength, ROM, and decrease pain and increase function by 12/12/2014.   Status New   PT LONG TERM GOAL #2   Title Patient will display full pain free AROM of her RUE to display ability to complete ADLs by 12/12/2014.    Baseline Active assist or passive ROM only currently.  Status New   PT LONG TERM GOAL #3   Title Patient will report a Quick Dash score of at least 60/80 to demonstrate increased tolerance for ADLs and performance of functional activities by 12/12/2014.    Baseline 0 currently as she is only able to perform PROM and AAROM   Status New   PT LONG TERM GOAL #4   Title Patient will display at least 4/5 strength in all shoulder girdle musculature to demonstrate increased strength and return to function by 12/12/2014.    Baseline Deferred testing at eval secondary to proximity to surgery.    Status New               Plan - Nov 28, 2014 1975    Clinical Impression Statement Patient is able to perform AROM and AAROM with RUE. She has no reports of pain during therapy and has full ROM  and 3/5 strength.   Pt will benefit from skilled therapeutic intervention in order to improve on the following deficits Impaired UE functional use;Pain;Decreased range of  motion;Decreased strength   Rehab Potential Good   Clinical Impairments Affecting Rehab Potential Surgical repair of right rotator cuff   PT Frequency 2x / week   PT Duration 8 weeks   PT Treatment/Interventions ADLs/Self Care Home Management;Aquatic Therapy;Cryotherapy;Ultrasound;Dry needling;Passive range of motion;Neuromuscular re-education;Scar mobilization;Therapeutic exercise;Therapeutic activities;Electrical Stimulation;Manual techniques   PT Next Visit Plan Progress HEP as instructed in medium to large repairs protocol in JOSPT           G-Codes - Nov 28, 2014 8832    Functional Assessment Tool Used ROM and clinical judgement   Functional Limitation Mobility: Walking and moving around      Problem List Patient Active Problem List   Diagnosis Date Noted  . Complete tear of right rotator cuff 09/19/2014  . Rupture of right biceps tendon 09/19/2014  . AC (acromioclavicular) arthritis 09/19/2014  . Impingement syndrome of right shoulder 09/19/2014  . Right rotator cuff tear 08/28/2014  . 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 Diarrhea 07/05/2014  . Osteopenia 08/23/2012  . Medicare annual wellness visit, subsequent 07/19/2012  . Screening for breast cancer 07/19/2012  . 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 Hypertension 10/27/2011  . DUB (dysfunctional uterine bleeding)   . Rosacea     Alanson Puls 11-28-14, 8:41 AM  [place] MAIN [facility] SERVICES [address] [place], [place], [address] Phone: [phone]   Fax:  [phone]

## 2014-11-16 ENCOUNTER — Encounter: Payer: Self-pay | Admitting: Physical Therapy

## 2014-11-16 ENCOUNTER — Ambulatory Visit: Payer: Medicare Other | Admitting: Physical Therapy

## 2014-11-16 DIAGNOSIS — M25511 Pain in right shoulder: Secondary | ICD-10-CM | POA: Diagnosis not present

## 2014-11-16 DIAGNOSIS — Z9889 Other specified postprocedural states: Secondary | ICD-10-CM

## 2014-11-16 NOTE — Therapy (Signed)
[place] MAIN [facility] SERVICES [address] [place], [place], [address] Phone: [phone]   Fax:  [phone]  Physical Therapy Treatment  Patient Details  Name: Katherine Stanley MRN: [id number] Date of Birth: 04-12-1946 Referring Provider:  Garald Balding, MD  Encounter Date: 11/16/2014      PT End of Session - 11/16/14 0841    Visit Number 12   PT Start Time 0830   PT Stop Time 0915   PT Time Calculation (min) 45 min   Activity Tolerance Patient tolerated treatment well   Behavior During Therapy [facility] for tasks assessed/performed      Past Medical History  Diagnosis Date  . Rosacea   . Hypertension     takes Lisinopril daily  . Headache     occasionally  . Arthritis   . Joint pain   . Osteopenia   . Diverticulosis     Past Surgical History  Procedure Laterality Date  . Cholecystectomy    . Refractive surgery    . Cholecystectomy    . Tonsillectomy  at age 59  . Vaginal hysterectomy  at age 23  . Colonoscopy    . Shoulder arthroscopy with open rotator cuff repair and distal clavicle acrominectomy Right 09/19/2014    Procedure: SHOULDER ARTHROSCOPY WITH MINI-OPEN ROTATOR CUFF REPAIR AND DISTAL CLAVICLE RESECTION, SUBACROMIAL DECOMPRESSION;  Surgeon: Garald Balding, MD;  Location: [place];  Service: Orthopedics;  Laterality: Right;    There were no vitals filed for this visit.  Visit Diagnosis:  H/O repair of right rotator cuff  Right shoulder pain      Subjective Assessment - 11/16/14 0840    Subjective Patient is having some soreness in her right arm from beginning the exercises. No reports of pain.   Pertinent History Patient had a right rotator cuff repair performed on 09/19/2014.She had a full tear of a RC tendon and long head of the biceps.    Limitations Sitting   Patient Stated Goals To return to playing golf   Currently in Pain? No/denies       Therapeutic exericse: UBE 5 mins (fwd/bwd) L1 1 # weight  supine flex x 20 x 2 1# sidelying LUE flex x 20 x 2 1# sidelying ER x 20 x 2 1# sidelying abd x 20 x 2 2# RUE supine bench press x 20 x 2 2# RUE supine protraction x 20 x 2 Standing with pillowcase on mirror flex/ext, horizontal abd/add x 20 x 2 Standing theraball over the door frame x 20 Machine triceps and biceps x 12 x 2 with 4 plates triceps and 2 plates biceps Standing body blade 30 deg x 30 sec No reports of pain .  Patient reports that she is doing more at home.                           PT Education - 11/16/14 0841    Education provided Yes   Education Details HEP   Person(s) Educated Patient   Methods Explanation   Comprehension Verbalized understanding             PT Long Term Goals - 10/03/14 1255    PT LONG TERM GOAL #1   Title Patient will be independent with a home exercise program to increase strength, ROM, and decrease pain and increase function by 12/12/2014.   Status New   PT LONG TERM GOAL #2   Title Patient will  display full pain free AROM of her RUE to display ability to complete ADLs by 12/12/2014.    Baseline Active assist or passive ROM only currently.    Status New   PT LONG TERM GOAL #3   Title Patient will report a Quick Dash score of at least 60/80 to demonstrate increased tolerance for ADLs and performance of functional activities by 12/12/2014.    Baseline 0 currently as she is only able to perform PROM and AAROM   Status New   PT LONG TERM GOAL #4   Title Patient will display at least 4/5 strength in all shoulder girdle musculature to demonstrate increased strength and return to function by 12/12/2014.    Baseline Deferred testing at eval secondary to proximity to surgery.    Status New               Plan - 11/16/14 6415    Clinical Impression Statement Patient is able to perform AROM and also 2 # weights with sidelying abd, sidelying flex and ER sidelying without pain. Patient will benefit from continued  progression of strengthening.    Pt will benefit from skilled therapeutic intervention in order to improve on the following deficits Impaired UE functional use;Pain;Decreased range of motion;Decreased strength   Rehab Potential Good   Clinical Impairments Affecting Rehab Potential Surgical repair of right rotator cuff   PT Frequency 2x / week   PT Duration 8 weeks   PT Treatment/Interventions ADLs/Self Care Home Management;Aquatic Therapy;Cryotherapy;Ultrasound;Dry needling;Passive range of motion;Neuromuscular re-education;Scar mobilization;Therapeutic exercise;Therapeutic activities;Electrical Stimulation;Manual techniques   PT Next Visit Plan Progress HEP as instructed in medium to large repairs protocol in JOSPT    Consulted and Agree with Plan of Care Patient        Problem List Patient Active Problem List   Diagnosis Date Noted  . Complete tear of right rotator cuff 09/19/2014  . Rupture of right biceps tendon 09/19/2014  . AC (acromioclavicular) arthritis 09/19/2014  . Impingement syndrome of right shoulder 09/19/2014  . Right rotator cuff tear 08/28/2014  . 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 Diarrhea 07/05/2014  . Osteopenia 08/23/2012  . Medicare annual wellness visit, subsequent 07/19/2012  . Screening for breast cancer 07/19/2012  . 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 Hypertension 10/27/2011  . DUB (dysfunctional uterine bleeding)   . Rosacea     Alanson Puls 11/16/2014, 9:15 AM  [place] MAIN [facility] SERVICES [address] [place], [place], [address] Phone: [phone]   Fax:  [phone]

## 2014-11-21 ENCOUNTER — Ambulatory Visit: Payer: Medicare Other | Admitting: Physical Therapy

## 2014-11-21 DIAGNOSIS — Z9889 Other specified postprocedural states: Secondary | ICD-10-CM

## 2014-11-21 DIAGNOSIS — M25511 Pain in right shoulder: Secondary | ICD-10-CM

## 2014-11-21 NOTE — Therapy (Signed)
[place] MAIN [facility] SERVICES [address] [place], [place], [address] Phone: [phone]   Fax:  [phone]  Physical Therapy Treatment  Patient Details  Name: Katherine Stanley MRN: [id number] Date of Birth: 03-22-1946 Referring Provider:  Garald Balding, MD  Encounter Date: 11/21/2014      PT End of Session - 11/21/14 0907    Visit Number 13   Number of Visits 17   Date for PT Re-Evaluation 12/12/14   Authorization Type 1   Authorization Time Period 10   PT Start Time 0830   PT Stop Time 0915   PT Time Calculation (min) 45 min   Activity Tolerance Patient tolerated treatment well   Behavior During Therapy [facility] for tasks assessed/performed      Past Medical History  Diagnosis Date  . Rosacea   . Hypertension     takes Lisinopril daily  . Headache     occasionally  . Arthritis   . Joint pain   . Osteopenia   . Diverticulosis     Past Surgical History  Procedure Laterality Date  . Cholecystectomy    . Refractive surgery    . Cholecystectomy    . Tonsillectomy  at age 57  . Vaginal hysterectomy  at age 44  . Colonoscopy    . Shoulder arthroscopy with open rotator cuff repair and distal clavicle acrominectomy Right 09/19/2014    Procedure: SHOULDER ARTHROSCOPY WITH MINI-OPEN ROTATOR CUFF REPAIR AND DISTAL CLAVICLE RESECTION, SUBACROMIAL DECOMPRESSION;  Surgeon: Garald Balding, MD;  Location: [place];  Service: Orthopedics;  Laterality: Right;    There were no vitals filed for this visit.  Visit Diagnosis:  H/O repair of right rotator cuff  Right shoulder pain      Subjective Assessment - 11/21/14 0833    Subjective Patient states she is aware of her RUE, but does have any pain.    Pertinent History Patient had a right rotator cuff repair performed on 09/19/2014.She had a full tear of a RC tendon and long head of the biceps.    Limitations Sitting   Patient Stated Goals To return to playing golf   Currently in  Pain? No/denies        TherEx  UBE level 2, 5 minutes (2.5 forward, 2.5 backward)   Chest Press with 3# x 20 bilaterally (x 3 sets)   Supine shoulder flexion 2# x 20, 3# x 20 bilaterally   Scapular punches 3# x 20 for 2 sets bilaterally   Sidelying ER with 2# (initially completed 20 but felt in her biceps moreso than ERs, added towel roll and instructed Teres minor and infraspinatus movement 2 sets x 20 reps  Prone rows against manual resistance x 20 with cuing to pinch shoulder blades, x 20 with 2# weight with cuing for hand positioning, x 20 with 2# weight   Prone horizontal abduction x 20 for 2 sets (2nd set with 2# weight)   Prone RUE extension with 2# weight x 20 repetitions.                          PT Education - 11/21/14 0906    Education provided Yes   Education Details Increased weights and began to initiate posterior cuff and MT and LT exercises in prone    Person(s) Educated Patient   Methods Explanation;Demonstration;Verbal cues;Tactile cues   Comprehension Verbalized understanding;Returned demonstration  PT Long Term Goals - 10/03/14 1255    PT LONG TERM GOAL #1   Title Patient will be independent with a home exercise program to increase strength, ROM, and decrease pain and increase function by 12/12/2014.   Status New   PT LONG TERM GOAL #2   Title Patient will display full pain free AROM of her RUE to display ability to complete ADLs by 12/12/2014.    Baseline Active assist or passive ROM only currently.    Status New   PT LONG TERM GOAL #3   Title Patient will report a Quick Dash score of at least 60/80 to demonstrate increased tolerance for ADLs and performance of functional activities by 12/12/2014.    Baseline 0 currently as she is only able to perform PROM and AAROM   Status New   PT LONG TERM GOAL #4   Title Patient will display at least 4/5 strength in all shoulder girdle musculature to demonstrate increased  strength and return to function by 12/12/2014.    Baseline Deferred testing at eval secondary to proximity to surgery.    Status New               Plan - 11/21/14 0908    Clinical Impression Statement Patient is able to tolerate increased weight on all exercises with no increase in pain. Patient initiated on posterior cuff and MT/LT strengthening, which initially required biofeedback for proper activation. Patient is able to demonstrate full felxion AROM on her RUE with no increase in symptoms and appears on track to meet all PT goals. Patient would continue to benefit from skilled PT services to progress strengthening of her RUE.    Pt will benefit from skilled therapeutic intervention in order to improve on the following deficits Impaired UE functional use;Pain;Decreased range of motion;Decreased strength   Rehab Potential Good   Clinical Impairments Affecting Rehab Potential Surgical repair of right rotator cuff   PT Frequency 2x / week   PT Duration 8 weeks   PT Treatment/Interventions ADLs/Self Care Home Management;Aquatic Therapy;Cryotherapy;Ultrasound;Dry needling;Passive range of motion;Neuromuscular re-education;Scar mobilization;Therapeutic exercise;Therapeutic activities;Electrical Stimulation;Manual techniques   PT Home Exercise Plan See patient instructions    Consulted and Agree with Plan of Care Patient        Problem List Patient Active Problem List   Diagnosis Date Noted  . Complete tear of right rotator cuff 09/19/2014  . Rupture of right biceps tendon 09/19/2014  . AC (acromioclavicular) arthritis 09/19/2014  . Impingement syndrome of right shoulder 09/19/2014  . Right rotator cuff tear 08/28/2014  . 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 Diarrhea 07/05/2014  . Osteopenia 08/23/2012  . Medicare annual wellness visit, subsequent 07/19/2012  . Screening for breast cancer 07/19/2012  . 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 Hypertension 10/27/2011  .  DUB (dysfunctional uterine bleeding)   . Rosacea     Kerman Passey, PT, DPT    11/21/2014, 10:38 AM  [place] MAIN [facility] SERVICES [address] [place], [place], [address] Phone: [phone]   Fax:  [phone]

## 2014-11-23 ENCOUNTER — Ambulatory Visit: Payer: Medicare Other | Admitting: Physical Therapy

## 2014-11-23 DIAGNOSIS — M25511 Pain in right shoulder: Secondary | ICD-10-CM

## 2014-11-23 DIAGNOSIS — Z9889 Other specified postprocedural states: Secondary | ICD-10-CM

## 2014-11-23 NOTE — Therapy (Signed)
[place] MAIN [facility] SERVICES [address] [place], [place], [address] Phone: [phone]   Fax:  [phone]  Physical Therapy Treatment  Patient Details  Name: Katherine Stanley MRN: [id number] Date of Birth: [birth year] Referring Provider:  Garald Balding, MD  Encounter Date: 11/23/2014      PT End of Session - 11/23/14 0842    Visit Number 14   Number of Visits 17   Date for PT Re-Evaluation 12/12/14   Authorization Type 2   Authorization Time Period 10   PT Start Time 0830   PT Stop Time 0915   PT Time Calculation (min) 45 min   Activity Tolerance Patient tolerated treatment well   Behavior During Therapy [facility] for tasks assessed/performed      Past Medical History  Diagnosis Date  . Rosacea   . Hypertension     takes Lisinopril daily  . Headache     occasionally  . Arthritis   . Joint pain   . Osteopenia   . Diverticulosis     Past Surgical History  Procedure Laterality Date  . Cholecystectomy    . Refractive surgery    . Cholecystectomy    . Tonsillectomy  at age 65  . Vaginal hysterectomy  at age 47  . Colonoscopy    . Shoulder arthroscopy with open rotator cuff repair and distal clavicle acrominectomy Right 09/19/2014    Procedure: SHOULDER ARTHROSCOPY WITH MINI-OPEN ROTATOR CUFF REPAIR AND DISTAL CLAVICLE RESECTION, SUBACROMIAL DECOMPRESSION;  Surgeon: Garald Balding, MD;  Location: [place];  Service: Orthopedics;  Laterality: Right;    There were no vitals filed for this visit.  Visit Diagnosis:  H/O repair of right rotator cuff  Right shoulder pain      Subjective Assessment - 11/23/14 0828    Subjective Patient reports her shoulder feels stiff when she wakes up, but between pendulums and a hot shower she states it loosens up.    Pertinent History Patient had a right rotator cuff repair performed on 09/19/2014.She had a full tear of a RC tendon and long head of the biceps.    Limitations Sitting   Patient Stated Goals To return to playing golf   Currently in Pain? No/denies      TherEx (all exercises performed bilaterally)   UBE -5 (2.5 minutes roward, 2.5 minutes retro)    Chest Press - 4# x 20 repetitions for 3 sets   Scapular punches 4# x 20 for 3 sets   Supine shoulder flexion 3# x 20 repetitions for 3 sets  Sidelying ER with 2#  3 sets x 20 reps, proper activation of teres minor and infraspinatus  Prone rows with 2# weight 3 x 15  Manual cuing for scapular retractions and appropriate humeral retraction (stopping at rib cage)   Prone shoulder horizontal abductions with 1# x 15 for 2 sets with cuing for scapular retraction   Shoulder scaption to 90 degrees with 1# x 10 repetitions with manual cuing and vc's for not shoulder shrugging. X 2 sets                           PT Education - 11/23/14 0842    Education provided Yes   Education Details Educated patient on continued use of ice packs and scapular retraction versus humeral extension.    Person(s) Educated Patient   Methods Explanation;Demonstration;Verbal cues   Comprehension Verbalized understanding;Returned demonstration  PT Long Term Goals - 10/03/14 1255    PT LONG TERM GOAL #1   Title Patient will be independent with a home exercise program to increase strength, ROM, and decrease pain and increase function by 12/12/2014.   Status New   PT LONG TERM GOAL #2   Title Patient will display full pain free AROM of her RUE to display ability to complete ADLs by 12/12/2014.    Baseline Active assist or passive ROM only currently.    Status New   PT LONG TERM GOAL #3   Title Patient will report a Quick Dash score of at least 60/80 to demonstrate increased tolerance for ADLs and performance of functional activities by 12/12/2014.    Baseline 0 currently as she is only able to perform PROM and AAROM   Status New   PT LONG TERM GOAL #4   Title Patient will display at least 4/5  strength in all shoulder girdle musculature to demonstrate increased strength and return to function by 12/12/2014.    Baseline Deferred testing at eval secondary to proximity to surgery.    Status New               Plan - 11/23/14 0844    Clinical Impression Statement Patient displays improved scapular retraction with cuing and manual feedback, and displays full pain free AROM during this session. She is able to display full active pain free horizontal adduction as well. Patient displays tolerance for increased weight/repetition for UE activities with no increase in pain. Patient appears to be progressing well towards all established PT goals.    Pt will benefit from skilled therapeutic intervention in order to improve on the following deficits Impaired UE functional use;Pain;Decreased range of motion;Decreased strength   Rehab Potential Good   Clinical Impairments Affecting Rehab Potential Surgical repair of right rotator cuff   PT Frequency 2x / week   PT Duration 8 weeks   PT Treatment/Interventions ADLs/Self Care Home Management;Aquatic Therapy;Cryotherapy;Ultrasound;Dry needling;Passive range of motion;Neuromuscular re-education;Scar mobilization;Therapeutic exercise;Therapeutic activities;Electrical Stimulation;Manual techniques   PT Next Visit Plan Progress HEP as instructed in medium to large repairs protocol in JOSPT    Consulted and Agree with Plan of Care Patient        Problem List Patient Active Problem List   Diagnosis Date Noted  . Complete tear of right rotator cuff 09/19/2014  . Rupture of right biceps tendon 09/19/2014  . AC (acromioclavicular) arthritis 09/19/2014  . Impingement syndrome of right shoulder 09/19/2014  . Right rotator cuff tear 08/28/2014  . 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 Diarrhea 07/05/2014  . Osteopenia 08/23/2012  . Medicare annual wellness visit, subsequent 07/19/2012  . Screening for breast cancer 07/19/2012  . Postmenopausal  estrogen deficiency 07/19/2012  . Hypertension 10/27/2011  . DUB (dysfunctional uterine bleeding)   . Rosacea     Kerman Passey, PT, DPT    11/23/2014, 6:04 PM  [place] MAIN [facility] SERVICES [address] [place], [place], [address] Phone: [phone]   Fax:  [phone]

## 2014-11-28 ENCOUNTER — Encounter: Payer: Self-pay | Admitting: Physical Therapy

## 2014-11-28 ENCOUNTER — Ambulatory Visit: Payer: Medicare Other | Admitting: Physical Therapy

## 2014-11-28 DIAGNOSIS — M25511 Pain in right shoulder: Secondary | ICD-10-CM

## 2014-11-28 DIAGNOSIS — Z9889 Other specified postprocedural states: Secondary | ICD-10-CM | POA: Diagnosis not present

## 2014-11-28 DIAGNOSIS — M1711 Unilateral primary osteoarthritis, right knee: Secondary | ICD-10-CM | POA: Diagnosis not present

## 2014-11-28 NOTE — Therapy (Signed)
[place] MAIN [facility] SERVICES [address] [place], [place], [address] Phone: [phone]   Fax:  [phone]  Physical Therapy Treatment  Patient Details  Name: Katherine Stanley MRN: [id number] Date of Birth: [birth year] Referring Provider:  Garald Balding, MD  Encounter Date: 11/28/2014      PT End of Session - 11/28/14 0852    Visit Number 15   Number of Visits 17   Date for PT Re-Evaluation 12/12/14   Authorization Type 3   PT Start Time 0830   PT Stop Time 0915   PT Time Calculation (min) 45 min   Activity Tolerance Patient tolerated treatment well   Behavior During Therapy [facility] for tasks assessed/performed      Past Medical History  Diagnosis Date  . Rosacea   . Hypertension     takes Lisinopril daily  . Headache     occasionally  . Arthritis   . Joint pain   . Osteopenia   . Diverticulosis     Past Surgical History  Procedure Laterality Date  . Cholecystectomy    . Refractive surgery    . Cholecystectomy    . Tonsillectomy  at age 69  . Vaginal hysterectomy  at age 69  . Colonoscopy    . Shoulder arthroscopy with open rotator cuff repair and distal clavicle acrominectomy Right 09/19/2014    Procedure: SHOULDER ARTHROSCOPY WITH MINI-OPEN ROTATOR CUFF REPAIR AND DISTAL CLAVICLE RESECTION, SUBACROMIAL DECOMPRESSION;  Surgeon: Garald Balding, MD;  Location: [place];  Service: Orthopedics;  Laterality: Right;    There were no vitals filed for this visit.  Visit Diagnosis:  H/O repair of right rotator cuff  Right shoulder pain      Subjective Assessment - 11/28/14 0846    Subjective Patient is not haaving pain but it gets tired.    Currently in Pain? No/denies        Therapeutic exericise: Body blade 30, 60, 90, 120 x 30 sec Corner push ups x 20 x 2 sidelying ER with 2 # sidelying RUE abd with 3 # x 20 x 2 Modified push up standing on table x 20 x 2 Ball bounce on wall x 20 x 2 Rows x 20 4 # x 2  sets Supine  60 deg shoulder flex with 3# 20 x 2, bench press with 3 # 20 x 2 Gym scapula retraction with 2 plates x 15 x 2 Overhead pull down with machine and 2 plates x 15 x 2                          PT Education - 11/28/14 0852    Education provided Yes   Education Details continue ice and exercise   Person(s) Educated Patient   Methods Explanation   Comprehension Verbalized understanding             PT Long Term Goals - 10/03/14 1255    PT LONG TERM GOAL #1   Title Patient will be independent with a home exercise program to increase strength, ROM, and decrease pain and increase function by 12/12/2014.   Status New   PT LONG TERM GOAL #2   Title Patient will display full pain free AROM of her RUE to display ability to complete ADLs by 12/12/2014.    Baseline Active assist or passive ROM only currently.    Status New   PT LONG TERM GOAL #3   Title Patient  will report a Quick Dash score of at least 60/80 to demonstrate increased tolerance for ADLs and performance of functional activities by 12/12/2014.    Baseline 0 currently as she is only able to perform PROM and AAROM   Status New   PT LONG TERM GOAL #4   Title Patient will display at least 4/5 strength in all shoulder girdle musculature to demonstrate increased strength and return to function by 12/12/2014.    Baseline Deferred testing at eval secondary to proximity to surgery.    Status New               Plan - 11/28/14 1275    Clinical Impression Statement Patient is able to perform all exercises today including overhead ball bouncing on wall and body blade.    Pt will benefit from skilled therapeutic intervention in order to improve on the following deficits Impaired UE functional use;Pain;Decreased range of motion;Decreased strength   Rehab Potential Good   Clinical Impairments Affecting Rehab Potential Surgical repair of right rotator cuff   PT Frequency 2x / week   PT Duration 8 weeks    PT Treatment/Interventions ADLs/Self Care Home Management;Aquatic Therapy;Cryotherapy;Ultrasound;Dry needling;Passive range of motion;Neuromuscular re-education;Scar mobilization;Therapeutic exercise;Therapeutic activities;Electrical Stimulation;Manual techniques   PT Next Visit Plan Progress HEP as instructed in medium to large repairs protocol in JOSPT    Consulted and Agree with Plan of Care Patient        Problem List Patient Active Problem List   Diagnosis Date Noted  . Complete tear of right rotator cuff 09/19/2014  . Rupture of right biceps tendon 09/19/2014  . AC (acromioclavicular) arthritis 09/19/2014  . Impingement syndrome of right shoulder 09/19/2014  . Right rotator cuff tear 08/28/2014  . 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 Diarrhea 07/05/2014  . Osteopenia 08/23/2012  . Medicare annual wellness visit, subsequent 07/19/2012  . Screening for breast cancer 07/19/2012  . 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 Hypertension 10/27/2011  . DUB (dysfunctional uterine bleeding)   . Rosacea     Alanson Puls 11/28/2014, 8:57 AM  [place] MAIN [facility] SERVICES [address] [place], [place], [address] Phone: [phone]   Fax:  [phone]

## 2014-11-30 ENCOUNTER — Encounter: Payer: Self-pay | Admitting: Physical Therapy

## 2014-11-30 ENCOUNTER — Ambulatory Visit: Payer: Medicare Other | Admitting: Physical Therapy

## 2014-11-30 DIAGNOSIS — Z9889 Other specified postprocedural states: Secondary | ICD-10-CM

## 2014-11-30 DIAGNOSIS — M25511 Pain in right shoulder: Secondary | ICD-10-CM

## 2014-11-30 NOTE — Therapy (Signed)
[place] MAIN [facility] SERVICES [address] [place], [place], [address] Phone: [phone]   Fax:  [phone]  Physical Therapy Treatment  Patient Details  Name: Katherine Stanley MRN: [id number] Date of Birth: 1945/05/02 Referring Provider:  Garald Balding, MD  Encounter Date: 11/30/2014      PT End of Session - 11/30/14 0834    Visit Number 16   Number of Visits 17   Date for PT Re-Evaluation 12/12/14   Authorization Type 3   PT Start Time 0830   PT Stop Time 0915   PT Time Calculation (min) 45 min   Activity Tolerance Patient tolerated treatment well   Behavior During Therapy [facility] for tasks assessed/performed      Past Medical History  Diagnosis Date  . Rosacea   . Hypertension     takes Lisinopril daily  . Headache     occasionally  . Arthritis   . Joint pain   . Osteopenia   . Diverticulosis     Past Surgical History  Procedure Laterality Date  . Cholecystectomy    . Refractive surgery    . Cholecystectomy    . Tonsillectomy  at age 33  . Vaginal hysterectomy  at age 54  . Colonoscopy    . Shoulder arthroscopy with open rotator cuff repair and distal clavicle acrominectomy Right 09/19/2014    Procedure: SHOULDER ARTHROSCOPY WITH MINI-OPEN ROTATOR CUFF REPAIR AND DISTAL CLAVICLE RESECTION, SUBACROMIAL DECOMPRESSION;  Surgeon: Garald Balding, MD;  Location: [place];  Service: Orthopedics;  Laterality: Right;    There were no vitals filed for this visit.  Visit Diagnosis:  H/O repair of right rotator cuff  Right shoulder pain      Subjective Assessment - 11/30/14 0833    Subjective Patient is not haaving pain but it gets tired.    Currently in Pain? No/denies          Therapeutic exericise: Body blade 30, 60, 90, 120 x 30 sec Corner push ups x 20 x 2 sidelying ER with 2 # sidelying RUE abd with 3 # x 20 x 2 Modified push up standing on table x 20 x 2 Ball bounce on wall x 20 x 2 Rows x 20 4 # x 2  sets Supine 60 deg shoulder flex with 3# 20 x 2, bench press with 3 # 20 x 2 Gym scapula retraction with 2 plates x 15 x 2 Overhead pull down with machine and 2 plates x 15 x 2                         PT Education - 11/30/14 0833    Education provided Yes   Education Details Progress HEP   Person(s) Educated Patient   Methods Explanation   Comprehension Verbalized understanding             PT Long Term Goals - 10/03/14 1255    PT LONG TERM GOAL #1   Title Patient will be independent with a home exercise program to increase strength, ROM, and decrease pain and increase function by 12/12/2014.   Status New   PT LONG TERM GOAL #2   Title Patient will display full pain free AROM of her RUE to display ability to complete ADLs by 12/12/2014.    Baseline Active assist or passive ROM only currently.    Status New   PT LONG TERM GOAL #3   Title Patient will report  a Quick Dash score of at least 60/80 to demonstrate increased tolerance for ADLs and performance of functional activities by 12/12/2014.    Baseline 0 currently as she is only able to perform PROM and AAROM   Status New   PT LONG TERM GOAL #4   Title Patient will display at least 4/5 strength in all shoulder girdle musculature to demonstrate increased strength and return to function by 12/12/2014.    Baseline Deferred testing at eval secondary to proximity to surgery.    Status New               Plan - 11/30/14 4098    Clinical Impression Statement Patient has full AROM and continueing to improve her strength wihtout pain in right shoulder.    Pt will benefit from skilled therapeutic intervention in order to improve on the following deficits Impaired UE functional use;Pain;Decreased range of motion;Decreased strength   Rehab Potential Good   Clinical Impairments Affecting Rehab Potential Surgical repair of right rotator cuff   PT Frequency 2x / week   PT Duration 8 weeks   PT  Treatment/Interventions ADLs/Self Care Home Management;Aquatic Therapy;Cryotherapy;Ultrasound;Dry needling;Passive range of motion;Neuromuscular re-education;Scar mobilization;Therapeutic exercise;Therapeutic activities;Electrical Stimulation;Manual techniques   PT Next Visit Plan Progress HEP as instructed in medium to large repairs protocol in JOSPT    Consulted and Agree with Plan of Care Patient        Problem List Patient Active Problem List   Diagnosis Date Noted  . Complete tear of right rotator cuff 09/19/2014  . Rupture of right biceps tendon 09/19/2014  . AC (acromioclavicular) arthritis 09/19/2014  . Impingement syndrome of right shoulder 09/19/2014  . Right rotator cuff tear 08/28/2014  . 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 Diarrhea 07/05/2014  . Osteopenia 08/23/2012  . Medicare annual wellness visit, subsequent 07/19/2012  . Screening for breast cancer 07/19/2012  . 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 Hypertension 10/27/2011  . DUB (dysfunctional uterine bleeding)   . Rosacea     Alanson Puls 11/30/2014, 8:36 AM  [place] MAIN [facility] SERVICES [address] [place], [place], [address] Phone: [phone]   Fax:  [phone]

## 2014-12-04 ENCOUNTER — Ambulatory Visit: Payer: Medicare Other | Admitting: Physical Therapy

## 2014-12-04 DIAGNOSIS — M25511 Pain in right shoulder: Secondary | ICD-10-CM

## 2014-12-04 DIAGNOSIS — M1711 Unilateral primary osteoarthritis, right knee: Secondary | ICD-10-CM | POA: Diagnosis not present

## 2014-12-04 DIAGNOSIS — Z9889 Other specified postprocedural states: Secondary | ICD-10-CM

## 2014-12-04 NOTE — Therapy (Signed)
[place] MAIN [facility] SERVICES [address] [place], [place], [address] Phone: [phone]   Fax:  [phone]  Physical Therapy Treatment  Patient Details  Name: Katherine Stanley MRN: [id number] Date of Birth: 12-09-1945 Referring Provider:  Garald Balding, MD  Encounter Date: 12/04/2014      PT End of Session - 12/04/14 0850    Visit Number 17   Number of Visits 17   Date for PT Re-Evaluation 12/12/14   Authorization Type 3   PT Start Time 0830   PT Stop Time 0915   PT Time Calculation (min) 45 min   Activity Tolerance Patient tolerated treatment well   Behavior During Therapy [facility] for tasks assessed/performed      Past Medical History  Diagnosis Date  . Rosacea   . Hypertension     takes Lisinopril daily  . Headache     occasionally  . Arthritis   . Joint pain   . Osteopenia   . Diverticulosis     Past Surgical History  Procedure Laterality Date  . Cholecystectomy    . Refractive surgery    . Cholecystectomy    . Tonsillectomy  at age 69  . Vaginal hysterectomy  at age 69  . Colonoscopy    . Shoulder arthroscopy with open rotator cuff repair and distal clavicle acrominectomy Right 09/19/2014    Procedure: SHOULDER ARTHROSCOPY WITH MINI-OPEN ROTATOR CUFF REPAIR AND DISTAL CLAVICLE RESECTION, SUBACROMIAL DECOMPRESSION;  Surgeon: Garald Balding, MD;  Location: [place];  Service: Orthopedics;  Laterality: Right;    There were no vitals filed for this visit.  Visit Diagnosis:  H/O repair of right rotator cuff  Right shoulder pain      Subjective Assessment - 12/04/14 0849    Subjective Patient is feeling great and has no pain.    Pain Score 0-No pain      Therapeutic exericise: Body blade 30, 60, 90, 120 x 30 sec Corner push ups x 20 x 2 sidelying ER with 2 # sidelying RUE abd with 3 # x 20 x 2 Modified push up standing on table x 20 x 2 Ball bounce on wall x 20 x 2 Rows x 20 4 # x 2 sets Supine 60 deg  shoulder flex with 3# 20 x 2, bench press with 3 # 20 x 2 Gym scapula retraction with 2 plates x 15 x 2 Overhead pull down with machine and 2 plates x 15 x                            PT Education - 12/04/14 0849    Education provided Yes   Person(s) Educated Patient   Methods Explanation   Comprehension Verbalized understanding             PT Long Term Goals - 10/03/14 1255    PT LONG TERM GOAL #1   Title Patient will be independent with a home exercise program to increase strength, ROM, and decrease pain and increase function by 12/12/2014.   Status New   PT LONG TERM GOAL #2   Title Patient will display full pain free AROM of her RUE to display ability to complete ADLs by 12/12/2014.    Baseline Active assist or passive ROM only currently.    Status New   PT LONG TERM GOAL #3   Title Patient will report a Quick Dash score of at least 60/80 to  demonstrate increased tolerance for ADLs and performance of functional activities by 12/12/2014.    Baseline 0 currently as she is only able to perform PROM and AAROM   Status New   PT LONG TERM GOAL #4   Title Patient will display at least 4/5 strength in all shoulder girdle musculature to demonstrate increased strength and return to function by 12/12/2014.    Baseline Deferred testing at eval secondary to proximity to surgery.    Status New               Plan - 12/04/14 6767    Clinical Impression Statement Patient has full ROM RUE and has weakness in stabilizing muscles.    Pt will benefit from skilled therapeutic intervention in order to improve on the following deficits Impaired UE functional use;Pain;Decreased range of motion;Decreased strength   Rehab Potential Good   Clinical Impairments Affecting Rehab Potential Surgical repair of right rotator cuff   PT Frequency 2x / week   PT Duration 8 weeks   PT Treatment/Interventions ADLs/Self Care Home Management;Aquatic Therapy;Cryotherapy;Ultrasound;Dry  needling;Passive range of motion;Neuromuscular re-education;Scar mobilization;Therapeutic exercise;Therapeutic activities;Electrical Stimulation;Manual techniques   PT Next Visit Plan Progress HEP as instructed in medium to large repairs protocol in JOSPT    Consulted and Agree with Plan of Care Patient        Problem List Patient Active Problem List   Diagnosis Date Noted  . Complete tear of right rotator cuff 09/19/2014  . Rupture of right biceps tendon 09/19/2014  . AC (acromioclavicular) arthritis 09/19/2014  . Impingement syndrome of right shoulder 09/19/2014  . Right rotator cuff tear 08/28/2014  . 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 Diarrhea 07/05/2014  . Osteopenia 08/23/2012  . Medicare annual wellness visit, subsequent 07/19/2012  . Screening for breast cancer 07/19/2012  . 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 Hypertension 10/27/2011  . DUB (dysfunctional uterine bleeding)   . Rosacea     Alanson Puls 12/04/2014, 9:17 AM  [place] MAIN [facility] SERVICES [address] [place], [place], [address] Phone: [phone]   Fax:  [phone]

## 2014-12-07 ENCOUNTER — Ambulatory Visit: Payer: Medicare Other | Admitting: Physical Therapy

## 2014-12-07 ENCOUNTER — Encounter: Payer: Self-pay | Admitting: Physical Therapy

## 2014-12-07 DIAGNOSIS — Z9889 Other specified postprocedural states: Secondary | ICD-10-CM | POA: Diagnosis not present

## 2014-12-07 DIAGNOSIS — M25511 Pain in right shoulder: Secondary | ICD-10-CM | POA: Diagnosis not present

## 2014-12-07 NOTE — Therapy (Signed)
[place] MAIN [facility] SERVICES [address] [place], [place], [address] Phone: [phone]   Fax:  [phone]  Physical Therapy Treatment/ DC summary  Patient Details  Name: Katherine Stanley MRN: [id number] Date of Birth: 01-09-1946 Referring Provider:  Garald Balding, MD  Encounter Date: 12/07/2014      PT End of Session - 12/07/14 0827    Visit Number 18   Number of Visits 17   Date for PT Re-Evaluation 12/12/14   Authorization Type 3   PT Start Time 0815   PT Stop Time 0855   PT Time Calculation (min) 40 min   Activity Tolerance Patient tolerated treatment well   Behavior During Therapy [facility] for tasks assessed/performed      Past Medical History  Diagnosis Date  . Rosacea   . Hypertension     takes Lisinopril daily  . Headache     occasionally  . Arthritis   . Joint pain   . Osteopenia   . Diverticulosis     Past Surgical History  Procedure Laterality Date  . Cholecystectomy    . Refractive surgery    . Cholecystectomy    . Tonsillectomy  at age 8  . Vaginal hysterectomy  at age 40  . Colonoscopy    . Shoulder arthroscopy with open rotator cuff repair and distal clavicle acrominectomy Right 09/19/2014    Procedure: SHOULDER ARTHROSCOPY WITH MINI-OPEN ROTATOR CUFF REPAIR AND DISTAL CLAVICLE RESECTION, SUBACROMIAL DECOMPRESSION;  Surgeon: Garald Balding, MD;  Location: [place];  Service: Orthopedics;  Laterality: Right;    There were no vitals filed for this visit.  Visit Diagnosis:  H/O repair of right rotator cuff  Right shoulder pain      Subjective Assessment - 12/07/14 0826    Subjective Patient is feeling great and has no pain.    Pain Score 0-No pain   Pain Orientation Right             Therapeutic exericise: Body blade 30, 60, 90, 120 x 30 sec Corner push ups x 20 x 2 sidelying ER with 4 # sidelying RUE abd with 4 # x 20 x 2 Modified push up standing on table x 20 x 2 Ball bounce on wall  x 20 x 2 Rows x 20 4 # x 2 sets Supine 60 deg shoulder flex with 4# 20 x 2, bench press with 4 # 20 x 2 Gym scapula retraction with 2 plates x 15 x 2 Overhead pull down with machine and 2 plates x 15                                 PT Education - 12/07/14 0826    Education provided Yes   Education Details HEP   Person(s) Educated Patient   Methods Explanation   Comprehension Verbalized understanding             PT Long Term Goals - 12/07/14 0829    PT LONG TERM GOAL #1   Status Achieved   PT LONG TERM GOAL #2   Status Achieved   PT LONG TERM GOAL #3   Status Achieved   PT LONG TERM GOAL #4   Status Achieved               Plan - 12/07/14 0829    Clinical Impression Statement Patient is able to perform exercises with 4 lbs and  has no reports of pain after treatment. She is going to be DC today to HEP. She has reached all goals.    Pt will benefit from skilled therapeutic intervention in order to improve on the following deficits Impaired UE functional use;Pain;Decreased range of motion;Decreased strength   Rehab Potential Good   Clinical Impairments Affecting Rehab Potential Surgical repair of right rotator cuff   PT Frequency 2x / week   PT Duration 8 weeks   PT Treatment/Interventions ADLs/Self Care Home Management;Aquatic Therapy;Cryotherapy;Ultrasound;Dry needling;Passive range of motion;Neuromuscular re-education;Scar mobilization;Therapeutic exercise;Therapeutic activities;Electrical Stimulation;Manual techniques   PT Next Visit Plan Progress HEP as instructed in medium to large repairs protocol in JOSPT    Consulted and Agree with Plan of Care Patient          G-Codes - December 31, 2014 0830    Functional Assessment Tool Used ROM and clinical judgement   Functional Limitation Mobility: Walking and moving around   Carrying, Moving and Handling Objects Current Status [phone]) At least 60 percent but less than 80 percent impaired, limited  or restricted   Carrying, Moving and Handling Objects Goal Status (Q3300) At least 1 percent but less than 20 percent impaired, limited or restricted      Problem List Patient Active Problem List   Diagnosis Date Noted  . Complete tear of right rotator cuff 09/19/2014  . Rupture of right biceps tendon 09/19/2014  . AC (acromioclavicular) arthritis 09/19/2014  . Impingement syndrome of right shoulder 09/19/2014  . Right rotator cuff tear 08/28/2014  . 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 Diarrhea 07/05/2014  . Osteopenia 08/23/2012  . Medicare annual wellness visit, subsequent 07/19/2012  . Screening for breast cancer 07/19/2012  . 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 Hypertension 10/27/2011  . DUB (dysfunctional uterine bleeding)   . Rosacea     Alanson Puls December 31, 2014, 8:32 AM  [place] MAIN [facility] SERVICES [address] [place], [place], [address] Phone: [phone]   Fax:  [phone]

## 2014-12-13 DIAGNOSIS — M1711 Unilateral primary osteoarthritis, right knee: Secondary | ICD-10-CM | POA: Diagnosis not present

## 2015-02-28 ENCOUNTER — Encounter: Payer: Self-pay | Admitting: Internal Medicine

## 2015-02-28 ENCOUNTER — Ambulatory Visit (INDEPENDENT_AMBULATORY_CARE_PROVIDER_SITE_OTHER): Payer: Medicare Other | Admitting: Internal Medicine

## 2015-02-28 VITALS — BP 129/86 | HR 91 | Temp 97.7°F | Ht 61.0 in | Wt 172.0 lb

## 2015-02-28 DIAGNOSIS — I10 Essential (primary) hypertension: Secondary | ICD-10-CM

## 2015-02-28 DIAGNOSIS — Z23 Encounter for immunization: Secondary | ICD-10-CM

## 2015-02-28 NOTE — Patient Instructions (Signed)
Follow-up in 6 months for physical exam.

## 2015-02-28 NOTE — Assessment & Plan Note (Signed)
BP Readings from Last 3 Encounters:  02/28/15 129/86  09/19/14 129/68  09/07/14 158/96   BP well controlled. Renal function last visit was normal. Will continue same medications. She prefers to wait to do labs in 6 months.

## 2015-02-28 NOTE — Progress Notes (Signed)
Pre visit review using our clinic review tool, if applicable. No additional management support is needed unless otherwise documented below in the visit note. 

## 2015-02-28 NOTE — Progress Notes (Signed)
Subjective:    Patient ID: Katherine Stanley, female    DOB: 07-13-1945, 69 y.o.   MRN: [id number]  HPI  69YO female presents for follow up.  Recovering from right rotator cuff repair. Completed PT. Back playing golf. No pain.  HTN - BP has been mostly 110s/70s. Added small amount of dark chocolate to diet.Compliant with medication.  Wt Readings from Last 3 Encounters:  02/28/15 172 lb (78.019 kg)  09/19/14 168 lb (76.204 kg)  09/07/14 168 lb 9.6 oz (76.476 kg)   BP Readings from Last 3 Encounters:  02/28/15 129/86  09/19/14 129/68  09/07/14 158/96    Past Medical History  Diagnosis Date  . Rosacea   . Hypertension     takes Lisinopril daily  . Headache     occasionally  . Arthritis   . Joint pain   . Osteopenia   . Diverticulosis    Family History  Problem Relation Age of Onset  . Hypertension Mother   . Heart disease Mother   . Kidney disease Mother   . Diabetes Father   . Hypertension Father   . Heart disease Father   . Heart disease Brother   . Cancer Maternal Aunt   . Ovarian cancer Maternal Aunt 85   Past Surgical History  Procedure Laterality Date  . Cholecystectomy    . Refractive surgery    . Cholecystectomy    . Tonsillectomy  at age 30  . Vaginal hysterectomy  at age 46  . Colonoscopy    . Shoulder arthroscopy with open rotator cuff repair and distal clavicle acrominectomy Right 09/19/2014    Procedure: SHOULDER ARTHROSCOPY WITH MINI-OPEN ROTATOR CUFF REPAIR AND DISTAL CLAVICLE RESECTION, SUBACROMIAL DECOMPRESSION;  Surgeon: Garald Balding, MD;  Location: [facility];  Service: Orthopedics;  Laterality: Right;   Social History   Social History  . Marital Status: Married    Spouse Name: N/A  . Number of Children: N/A  . Years of Education: N/A   Social History Main Topics  . Smoking status: Never Smoker   . Smokeless tobacco: None  . Alcohol Use: No  . Drug Use: No  . Sexual Activity: Yes    Birth Control/ Protection: Post-menopausal,  Surgical   Other Topics Concern  . None   Social History Narrative   Lives in [place] with husband. No children. No pets.      Work - retired Sales promotion account executive   Diet - regular   Exercise - golf, gardening    Review of Systems  Constitutional: Negative for fever, chills, appetite change, fatigue and unexpected weight change.  Eyes: Negative for visual disturbance.  Respiratory: Negative for shortness of breath.   Cardiovascular: Negative for chest pain, palpitations and leg swelling.  Gastrointestinal: Negative for nausea, vomiting, abdominal pain, diarrhea and constipation.  Musculoskeletal: Negative for myalgias and arthralgias.  Skin: Negative for color change and rash.  Hematological: Negative for adenopathy. Does not bruise/bleed easily.  Psychiatric/Behavioral: Negative for sleep disturbance and dysphoric mood. The patient is not nervous/anxious.        Objective:    BP 129/86 mmHg  Pulse 91  Temp(Src) 97.7 F (36.5 C) (Oral)  Ht 5\' 1"  (1.549 m)  Wt 172 lb (78.019 kg)  BMI 32.52 kg/m2  SpO2 94% Physical Exam  Constitutional: She is oriented to person, place, and time. She appears well-developed and well-nourished. No distress.  HENT:  Head: Normocephalic and atraumatic.  Right Ear: External ear normal.  Left Ear: External ear  normal.  Nose: Nose normal.  Mouth/Throat: Oropharynx is clear and moist. No oropharyngeal exudate.  Eyes: Conjunctivae are normal. Pupils are equal, round, and reactive to light. Right eye exhibits no discharge. Left eye exhibits no discharge. No scleral icterus.  Neck: Normal range of motion. Neck supple. No tracheal deviation present. No thyromegaly present.  Cardiovascular: Normal rate, regular rhythm, normal heart sounds and intact distal pulses.  Exam reveals no gallop and no friction rub.   No murmur heard. Pulmonary/Chest: Effort normal and breath sounds normal. No respiratory distress. She has no wheezes. She has no rales. She  exhibits no tenderness.  Musculoskeletal: Normal range of motion. She exhibits no edema or tenderness.  Lymphadenopathy:    She has no cervical adenopathy.  Neurological: She is alert and oriented to person, place, and time. No cranial nerve deficit. She exhibits normal muscle tone. Coordination normal.  Skin: Skin is warm and dry. No rash noted. She is not diaphoretic. No erythema. No pallor.  Psychiatric: She has a normal mood and affect. Her behavior is normal. Judgment and thought content normal.          Assessment & Plan:   Problem List Items Addressed This Visit      Unprioritized   Hypertension - Primary    BP Readings from Last 3 Encounters:  02/28/15 129/86  09/19/14 129/68  09/07/14 158/96   BP well controlled. Renal function last visit was normal. Will continue same medications. She prefers to wait to do labs in 6 months.          Return in about 6 months (around 08/28/2015) for Wellness Visit.

## 2015-03-27 ENCOUNTER — Telehealth: Payer: Self-pay | Admitting: Internal Medicine

## 2015-03-27 NOTE — Telephone Encounter (Signed)
Left voice mail has ear problem ear canal is swollen and closed.

## 2015-03-27 NOTE — Telephone Encounter (Signed)
Left a message for her to return my call.

## 2015-03-28 NOTE — Telephone Encounter (Signed)
Spoke with patient She believes that she has scratched her ear and made it swell.  She has been using OTC ear drops and it is helping.  No appointment needed.

## 2015-05-28 DIAGNOSIS — L219 Seborrheic dermatitis, unspecified: Secondary | ICD-10-CM | POA: Diagnosis not present

## 2015-05-28 DIAGNOSIS — Z1283 Encounter for screening for malignant neoplasm of skin: Secondary | ICD-10-CM | POA: Diagnosis not present

## 2015-05-28 DIAGNOSIS — L578 Other skin changes due to chronic exposure to nonionizing radiation: Secondary | ICD-10-CM | POA: Diagnosis not present

## 2015-05-28 DIAGNOSIS — L814 Other melanin hyperpigmentation: Secondary | ICD-10-CM | POA: Diagnosis not present

## 2015-05-28 DIAGNOSIS — D1800 Hemangioma unspecified site: Secondary | ICD-10-CM | POA: Diagnosis not present

## 2015-05-28 DIAGNOSIS — D692 Other nonthrombocytopenic purpura: Secondary | ICD-10-CM | POA: Diagnosis not present

## 2015-05-28 DIAGNOSIS — L820 Inflamed seborrheic keratosis: Secondary | ICD-10-CM | POA: Diagnosis not present

## 2015-05-28 DIAGNOSIS — D229 Melanocytic nevi, unspecified: Secondary | ICD-10-CM | POA: Diagnosis not present

## 2015-05-28 DIAGNOSIS — L821 Other seborrheic keratosis: Secondary | ICD-10-CM | POA: Diagnosis not present

## 2015-07-30 DIAGNOSIS — H43393 Other vitreous opacities, bilateral: Secondary | ICD-10-CM | POA: Diagnosis not present

## 2015-07-30 DIAGNOSIS — H2513 Age-related nuclear cataract, bilateral: Secondary | ICD-10-CM | POA: Diagnosis not present

## 2015-07-30 DIAGNOSIS — H25043 Posterior subcapsular polar age-related cataract, bilateral: Secondary | ICD-10-CM | POA: Diagnosis not present

## 2015-08-29 ENCOUNTER — Other Ambulatory Visit: Payer: Medicare Other

## 2015-08-29 ENCOUNTER — Ambulatory Visit (INDEPENDENT_AMBULATORY_CARE_PROVIDER_SITE_OTHER): Payer: Medicare Other

## 2015-08-29 VITALS — BP 132/88 | HR 94 | Temp 97.4°F | Resp 14 | Ht 60.0 in | Wt 167.8 lb

## 2015-08-29 DIAGNOSIS — Z1239 Encounter for other screening for malignant neoplasm of breast: Secondary | ICD-10-CM | POA: Diagnosis not present

## 2015-08-29 DIAGNOSIS — Z0000 Encounter for general adult medical examination without abnormal findings: Secondary | ICD-10-CM

## 2015-08-29 NOTE — Progress Notes (Signed)
Subjective:   Katherine Stanley is a 70 y.o. female who presents for Medicare Annual (Subsequent) preventive examination.  Review of Systems:  No ROS.  Medicare Wellness Visit.  Cardiac Risk Factors include: advanced age (>29men, >78 women);hypertension     Objective:     Vitals: BP 132/88 mmHg  Pulse 94  Temp(Src) 97.4 F (36.3 C) (Oral)  Resp 14  Ht 5' (1.524 m)  Wt 167 lb 12.8 oz (76.114 kg)  BMI 32.77 kg/m2  SpO2 96%  Body mass index is 32.77 kg/(m^2).   Tobacco History  Smoking status  . Never Smoker   Smokeless tobacco  . Not on file     Counseling given: Not Answered   Past Medical History  Diagnosis Date  . Rosacea   . Hypertension     takes Lisinopril daily  . Headache     occasionally  . Arthritis   . Joint pain   . Osteopenia   . Diverticulosis    Past Surgical History  Procedure Laterality Date  . Cholecystectomy    . Refractive surgery    . Cholecystectomy    . Tonsillectomy  at age 58  . Vaginal hysterectomy  at age 30  . Colonoscopy    . Shoulder arthroscopy with open rotator cuff repair and distal clavicle acrominectomy Right 09/19/2014    Procedure: SHOULDER ARTHROSCOPY WITH MINI-OPEN ROTATOR CUFF REPAIR AND DISTAL CLAVICLE RESECTION, SUBACROMIAL DECOMPRESSION;  Surgeon: Garald Balding, MD;  Location: [facility];  Service: Orthopedics;  Laterality: Right;   Family History  Problem Relation Age of Onset  . Hypertension Mother   . Heart disease Mother   . Kidney disease Mother   . Diabetes Father   . Hypertension Father   . Heart disease Father   . Heart disease Brother   . Cancer Maternal Aunt   . Ovarian cancer Maternal Aunt 85   History  Sexual Activity  . Sexual Activity: Yes  . Birth Control/ Protection: Post-menopausal, Surgical    Outpatient Encounter Prescriptions as of 08/29/2015  Medication Sig  . Ascorbic Acid (VITAMIN C PO) Take 1 tablet by mouth daily.   Marland Kitchen aspirin 81 MG tablet Take 81 mg by mouth daily.  .  Cholecalciferol (VITAMIN D PO) Take 2 tablets by mouth daily.   . Cyanocobalamin (B-12 PO) Take 1 tablet by mouth once a week. Monday  . folic acid (FOLVITE) A999333 MCG tablet Take 400 mcg by mouth daily.  . Garlic 10 MG CAPS Take 1 tablet by mouth daily.  Marland Kitchen lisinopril (PRINIVIL,ZESTRIL) 10 MG tablet Take 1 tablet (10 mg total) by mouth daily. TAKE ONE TABLET BY MOUTH EVERY DAY  . LYSINE PO Take 1 tablet by mouth as needed (fever blister).   . magnesium gluconate (MAGONATE) 500 MG tablet Take 250 mg by mouth 2 (two) times daily.  . Multiple Vitamins-Minerals (ALIVE WOMENS ENERGY) TABS Take 1 tablet by mouth.  . potassium phosphate, monobasic, (K-PHOS ORIGINAL) 500 MG tablet Take 595 mg by mouth 4 (four) times daily -  with meals and at bedtime.  . Probiotic Product (PROBIOTIC DAILY PO) Take 1 capsule by mouth daily.   Marland Kitchen VITAMIN E PO Take 1 capsule by mouth daily.    No facility-administered encounter medications on file as of 08/29/2015.    Activities of Daily Living In your present state of health, do you have any difficulty performing the following activities: 08/29/2015 09/07/2014  Hearing? N N  Vision? N N  Difficulty concentrating or  making decisions? N N  Walking or climbing stairs? N N  Dressing or bathing? N N  Doing errands, shopping? N N  Preparing Food and eating ? N -  Using the Toilet? N -  In the past six months, have you accidently leaked urine? N -  Do you have problems with loss of bowel control? N -  Managing your Medications? N -  Managing your Finances? N -  Housekeeping or managing your Housekeeping? N -    Patient Care Team: Jackolyn Confer, MD as PCP - General (Internal Medicine)    Assessment:   This is a routine wellness examination for [name]. The goal of the wellness visit is to assist the patient how to close the gaps in care and create a preventative care plan for the patient.   Taking VIT D as appropriate/Osteoporosis reviewed..   Medications  reviewed; taking without issues or barriers.  Safety issues reviewed; smoke detectors in the home. Firearms are locked in a secure place in the home. Wears seatbelts when driving or riding with others. No violence in the home.  No identified risk were noted; The patient was oriented x 3; appropriate in dress and manner and no objective failures at ADL's or IADL's.   ZOSTAVAX vaccine postponed, per patient request.  Mammogram; scheduled.  Patient Concerns:  OK to add Hepatitis C Screening with other labs for upcoming physical. Prefers to have labs drawn during CPE appointment.  Intermittent R knee pain; stable and followed by Ortho.   Exercise Activities and Dietary recommendations Current Exercise Habits: Home exercise routine (Golfs 2-3 times weekly, elyptical 2-3 times weekily), Type of exercise: calisthenics, Time (Minutes): > 60, Frequency (Times/Week): 3, Weekly Exercise (Minutes/Week): 0, Intensity: Intense  Goals    . Healthy Lifestyle     Stay hydrated and drink plenty of fluids. Low carb foods. Lean meats and vegetables. Stay active and continue exercising as tolerated.      Fall Risk Fall Risk  08/29/2015 07/20/2013 07/19/2012 04/05/2012  Falls in the past year? No No No No   Depression Screen PHQ 2/9 Scores 08/29/2015 07/20/2013 07/19/2012 04/05/2012  PHQ - 2 Score 0 0 0 0     Cognitive Testing MMSE - Mini Mental State Exam 08/29/2015  Orientation to time 5  Orientation to Place 5  Registration 3  Attention/ Calculation 5  Recall 3  Language- name 2 objects 2  Language- repeat 1  Language- follow 3 step command 3  Language- read & follow direction 1  Write a sentence 1  Copy design 1  Total score 30    Immunization History  Administered Date(s) Administered  . Influenza,inj,Quad PF,36+ Mos 02/24/2013, 01/23/2014, 02/28/2015  . Pneumococcal Conjugate-13 07/20/2013  . Pneumococcal Polysaccharide-23 07/24/2014  . Td 07/16/2010   Screening Tests Health  Maintenance  Topic Date Due  . Hepatitis C Screening  [birth year]  . ZOSTAVAX  08/27/2016 (Originally 01/30/2007)  . MAMMOGRAM  08/31/2015  . INFLUENZA VACCINE  11/13/2015  . COLONOSCOPY  10/27/2019  . TETANUS/TDAP  07/15/2020  . DEXA SCAN  Completed  . PNA vac Low Risk Adult  Completed      Plan:   End of life planning; Advance aging; Advanced directives discussed. Copy of current HCPOA/Living Will requested.   During the course of the visit the patient was educated and counseled about the following appropriate screening and preventive services:   Vaccines to include Pneumoccal, Influenza, Hepatitis B, Td, Zostavax, HCV  Electrocardiogram  Cardiovascular Disease  Colorectal  cancer screening  Bone density screening  Diabetes screening  Glaucoma screening  Mammography/PAP  Nutrition counseling   Patient Instructions (the written plan) was given to the patient.   Varney Biles, LPN  [phone]

## 2015-08-29 NOTE — Patient Instructions (Addendum)
Katherine Stanley , Thank you for taking time to come for your Medicare Wellness Visit. I appreciate your ongoing commitment to your health goals. Please review the following plan we discussed and let me know if I can assist you in the future.   Return in July for physical with Dr. Gilford Rile.     This is a list of the screening recommended for you and due dates:  Health Maintenance  Topic Date Due  .  Hepatitis C: One time screening is recommended by Center for Disease Control  (CDC) for  adults born from [birth year] through 1964.   03-14-1946  . Shingles Vaccine  08/27/2016*  . Mammogram  08/31/2015  . Flu Shot  11/13/2015  . Colon Cancer Screening  10/27/2019  . Tetanus Vaccine  07/15/2020  . DEXA scan (bone density measurement)  Completed  . Pneumonia vaccines  Completed  *Topic was postponed. The date shown is not the original due date.    Mammogram A mammogram is an X-ray of the breasts that is done to check for abnormal changes. This procedure can screen for and detect any changes that may suggest breast cancer. A mammogram can also identify other changes and variations in the breast, such as:  Inflammation of the breast tissue (mastitis).  An infected area that contains a collection of pus (abscess).  A fluid-filled sac (cyst).  Fibrocystic changes. This is when breast tissue becomes denser, which can make the tissue feel rope-like or uneven under the skin.  Tumors that are not cancerous (benign). LET [facility] CARE PROVIDER KNOW ABOUT:  Any allergies you have.  If you have breast implants.  If you have had previous breast disease, biopsy, or surgery.  If you are breastfeeding.  Any possibility that you could be pregnant, if this applies.  If you are younger than age 69.  If you have a family history of breast cancer. RISKS AND COMPLICATIONS Generally, this is a safe procedure. However, problems may occur, including:  Exposure to radiation. Radiation levels are very low  with this test.  The results being misinterpreted.  The need for further tests.  The inability of the mammogram to detect certain cancers. BEFORE THE PROCEDURE  Schedule your test about 1-2 weeks after your menstrual period. This is usually when your breasts are the least tender.  If you have had a mammogram done at a different facility in the past, get the mammogram X-rays or have them sent to your current exam facility in order to compare them.  Wash your breasts and under your arms the day of the test.  Do not wear deodorants, perfumes, lotions, or powders anywhere on your body on the day of the test.  Remove any jewelry from your neck.  Wear clothes that you can change into and out of easily. PROCEDURE  You will undress from the waist up and put on a gown.  You will stand in front of the X-ray machine.  Each breast will be placed between two plastic or glass plates. The plates will compress your breast for a few seconds. Try to stay as relaxed as possible during the procedure. This does not cause any harm to your breasts and any discomfort you feel will be very brief.  X-rays will be taken from different angles of each breast. The procedure may vary among health care providers and hospitals. AFTER THE PROCEDURE  The mammogram will be examined by a specialist (radiologist).  You may need to repeat certain parts of the  test, depending on the quality of the images. This is commonly done if the radiologist needs a better view of the breast tissue.  Ask when your test results will be ready. Make sure you get your test results.  You may resume your normal activities.   This information is not intended to replace advice given to you by your health care provider. Make sure you discuss any questions you have with your health care provider.   Document Released: 03/28/2000 Document Revised: 12/20/2014 Document Reviewed: 06/09/2014 Elsevier Interactive Patient Education [employer]

## 2015-08-29 NOTE — Progress Notes (Signed)
Annual Wellness Visit as completed by Health Coach was reviewed in full.  

## 2015-08-30 ENCOUNTER — Other Ambulatory Visit: Payer: Medicare Other

## 2015-08-31 DIAGNOSIS — M170 Bilateral primary osteoarthritis of knee: Secondary | ICD-10-CM | POA: Diagnosis not present

## 2015-09-07 ENCOUNTER — Ambulatory Visit: Payer: Medicare Other

## 2015-09-19 ENCOUNTER — Other Ambulatory Visit: Payer: Self-pay | Admitting: Internal Medicine

## 2015-09-19 ENCOUNTER — Ambulatory Visit
Admission: RE | Admit: 2015-09-19 | Discharge: 2015-09-19 | Disposition: A | Discharge status: Home / Self Care | Payer: Medicare Other | Source: Ambulatory Visit | Attending: Internal Medicine | Admitting: Internal Medicine

## 2015-09-19 DIAGNOSIS — Z1239 Encounter for other screening for malignant neoplasm of breast: Secondary | ICD-10-CM

## 2015-09-19 DIAGNOSIS — Z1231 Encounter for screening mammogram for malignant neoplasm of breast: Secondary | ICD-10-CM | POA: Insufficient documentation

## 2015-09-19 LAB — HM MAMMOGRAPHY

## 2015-10-24 ENCOUNTER — Encounter: Payer: Self-pay | Admitting: Internal Medicine

## 2015-10-24 ENCOUNTER — Ambulatory Visit (INDEPENDENT_AMBULATORY_CARE_PROVIDER_SITE_OTHER): Payer: Medicare Other | Admitting: Internal Medicine

## 2015-10-24 VITALS — BP 120/78 | HR 104 | Ht 60.0 in | Wt 170.6 lb

## 2015-10-24 DIAGNOSIS — I10 Essential (primary) hypertension: Secondary | ICD-10-CM | POA: Diagnosis not present

## 2015-10-24 DIAGNOSIS — Z0000 Encounter for general adult medical examination without abnormal findings: Secondary | ICD-10-CM | POA: Diagnosis not present

## 2015-10-24 LAB — CBC WITH DIFFERENTIAL/PLATELET
BASOS ABS: 0.1 10*3/uL (ref 0.0–0.1)
BASOS PCT: 0.8 % (ref 0.0–3.0)
EOS ABS: 0.1 10*3/uL (ref 0.0–0.7)
Eosinophils Relative: 1.5 % (ref 0.0–5.0)
HEMATOCRIT: 41 % (ref 36.0–46.0)
HEMOGLOBIN: 14.1 g/dL (ref 12.0–15.0)
Lymphocytes Relative: 37.9 % (ref 12.0–46.0)
Lymphs Abs: 2.5 10*3/uL (ref 0.7–4.0)
MCHC: 34.4 g/dL (ref 30.0–36.0)
MCV: 90.8 fl (ref 78.0–100.0)
MONO ABS: 0.6 10*3/uL (ref 0.1–1.0)
Monocytes Relative: 9 % (ref 3.0–12.0)
Neutro Abs: 3.4 10*3/uL (ref 1.4–7.7)
Neutrophils Relative %: 50.8 % (ref 43.0–77.0)
Platelets: 240 10*3/uL (ref 150.0–400.0)
RBC: 4.52 Mil/uL (ref 3.87–5.11)
RDW: 12.7 % (ref 11.5–15.5)
WBC: 6.6 10*3/uL (ref 4.0–10.5)

## 2015-10-24 LAB — COMPREHENSIVE METABOLIC PANEL
ALBUMIN: 4.3 g/dL (ref 3.5–5.2)
ALT: 18 U/L (ref 0–35)
AST: 15 U/L (ref 0–37)
Alkaline Phosphatase: 58 U/L (ref 39–117)
BILIRUBIN TOTAL: 0.7 mg/dL (ref 0.2–1.2)
BUN: 20 mg/dL (ref 6–23)
CALCIUM: 9.8 mg/dL (ref 8.4–10.5)
CHLORIDE: 107 meq/L (ref 96–112)
CO2: 22 mEq/L (ref 19–32)
CREATININE: 0.64 mg/dL (ref 0.40–1.20)
GFR: 97.87 mL/min (ref >60.00)
Glucose, Bld: 113 mg/dL — ABNORMAL HIGH (ref 70–99)
Potassium: 4.2 mEq/L (ref 3.5–5.1)
SODIUM: 138 meq/L (ref 135–145)
Total Protein: 7.3 g/dL (ref 6.0–8.3)

## 2015-10-24 LAB — LIPID PANEL
CHOL/HDL RATIO: 3
CHOLESTEROL: 177 mg/dL (ref 0–200)
HDL: 59.2 mg/dL (ref >39.00)
LDL CALC: 94 mg/dL (ref 0–99)
NonHDL: 117.36
TRIGLYCERIDES: 115 mg/dL (ref 0.0–149.0)
VLDL: 23 mg/dL (ref 0.0–40.0)

## 2015-10-24 LAB — MICROALBUMIN / CREATININE URINE RATIO
CREATININE, U: 75.4 mg/dL
MICROALB/CREAT RATIO: 0.9 mg/g (ref 0.0–30.0)

## 2015-10-24 MED ORDER — LISINOPRIL 10 MG PO TABS: 10.0000 mg | ORAL_TABLET | Freq: Every day | ORAL | Status: DC

## 2015-10-24 NOTE — Progress Notes (Signed)
Subjective:    Patient ID: Katherine Stanley, female    DOB: 1945/12/19, 70 y.o.   MRN: [id number]  HPI  70YO female presents for physical exam.  Feeling well. No recent injuries or changes to health. Follows a healthy diet. Exercises with golf and elliptical.  Wt Readings from Last 3 Encounters:  10/24/15 170 lb 9.6 oz (77.384 kg)  08/29/15 167 lb 12.8 oz (76.114 kg)  02/28/15 172 lb (78.019 kg)   BP Readings from Last 3 Encounters:  10/24/15 120/78  08/29/15 132/88  02/28/15 129/86    Past Medical History  Diagnosis Date  . Rosacea   . Hypertension     takes Lisinopril daily  . Headache     occasionally  . Arthritis   . Joint pain   . Osteopenia   . Diverticulosis    Family History  Problem Relation Age of Onset  . Hypertension Mother   . Heart disease Mother   . Kidney disease Mother   . Diabetes Father   . Hypertension Father   . Heart disease Father   . Heart disease Brother   . Cancer Maternal Aunt   . Ovarian cancer Maternal Aunt 85  . Breast cancer Neg Hx    Past Surgical History  Procedure Laterality Date  . Cholecystectomy    . Refractive surgery    . Cholecystectomy    . Tonsillectomy  at age 35  . Vaginal hysterectomy  at age 19  . Colonoscopy    . Shoulder arthroscopy with open rotator cuff repair and distal clavicle acrominectomy Right 09/19/2014    Procedure: SHOULDER ARTHROSCOPY WITH MINI-OPEN ROTATOR CUFF REPAIR AND DISTAL CLAVICLE RESECTION, SUBACROMIAL DECOMPRESSION;  Surgeon: Garald Balding, MD;  Location: [facility];  Service: Orthopedics;  Laterality: Right;   Social History   Social History  . Marital Status: Married    Spouse Name: N/A  . Number of Children: N/A  . Years of Education: N/A   Social History Main Topics  . Smoking status: Never Smoker   . Smokeless tobacco: None  . Alcohol Use: No  . Drug Use: No  . Sexual Activity: Yes    Birth Control/ Protection: Post-menopausal, Surgical   Other Topics Concern  . None     Social History Narrative   Lives in [place] with husband. No children. No pets.      Work - retired Sales promotion account executive   Diet - regular   Exercise - golf, gardening    Review of Systems  Constitutional: Negative for fever, chills, appetite change, fatigue and unexpected weight change.  Eyes: Negative for visual disturbance.  Respiratory: Negative for cough and shortness of breath.   Cardiovascular: Negative for chest pain and leg swelling.  Gastrointestinal: Negative for nausea, vomiting, abdominal pain, diarrhea and constipation.  Musculoskeletal: Negative for myalgias and arthralgias.  Skin: Negative for color change and rash.  Neurological: Negative for weakness.  Hematological: Negative for adenopathy. Does not bruise/bleed easily.  Psychiatric/Behavioral: Negative for sleep disturbance and dysphoric mood. The patient is not nervous/anxious.        Objective:    BP 120/78 mmHg  Pulse 104  Ht 5' (1.524 m)  Wt 170 lb 9.6 oz (77.384 kg)  BMI 33.32 kg/m2  SpO2 97% Physical Exam  Constitutional: She is oriented to person, place, and time. She appears well-developed and well-nourished. No distress.  HENT:  Head: Normocephalic and atraumatic.  Right Ear: External ear normal.  Left Ear: External ear normal.  Nose:  Nose normal.  Mouth/Throat: Oropharynx is clear and moist. No oropharyngeal exudate.  Eyes: Conjunctivae are normal. Pupils are equal, round, and reactive to light. Right eye exhibits no discharge. Left eye exhibits no discharge. No scleral icterus.  Neck: Normal range of motion. Neck supple. No tracheal deviation present. No thyromegaly present.  Cardiovascular: Normal rate, regular rhythm, normal heart sounds and intact distal pulses.  Exam reveals no gallop and no friction rub.   No murmur heard. Pulmonary/Chest: Effort normal and breath sounds normal. No accessory muscle usage. No tachypnea. No respiratory distress. She has no decreased breath sounds. She  has no wheezes. She has no rales. She exhibits no tenderness. Right breast exhibits no inverted nipple, no mass, no nipple discharge, no skin change and no tenderness. Left breast exhibits no inverted nipple, no mass, no nipple discharge, no skin change and no tenderness. Breasts are symmetrical.  Abdominal: Soft. Bowel sounds are normal. She exhibits no distension and no mass. There is no tenderness. There is no rebound and no guarding.  Musculoskeletal: Normal range of motion. She exhibits no edema or tenderness.  Lymphadenopathy:    She has no cervical adenopathy.  Neurological: She is alert and oriented to person, place, and time. No cranial nerve deficit. She exhibits normal muscle tone. Coordination normal.  Skin: Skin is warm and dry. No rash noted. She is not diaphoretic. No erythema. No pallor.  Psychiatric: She has a normal mood and affect. Her behavior is normal. Judgment and thought content normal.          Assessment & Plan:   Problem List Items Addressed This Visit      Unprioritized   Hypertension   Relevant Medications   lisinopril (PRINIVIL,ZESTRIL) 10 MG tablet   Routine general medical examination at a health care facility - Primary    General medical exam normal today including breast exam. PAP and pelvic deferred as normal PAP in 2012 and s/p hysterectomy. Mammogram reviewed and UTD. Labs today. Colonoscopy is UTD. Immunizations UTD. Encouraged healthy diet and exercise.      Relevant Orders   CBC with Differential/Platelet   Comprehensive metabolic panel   Lipid panel   Microalbumin / creatinine urine ratio   Hepatitis C antibody       Return in about 6 months (around 04/25/2016) for New Patient.  Ronette Deter, MD Internal Medicine [facility]

## 2015-10-24 NOTE — Patient Instructions (Signed)
Health Maintenance, Female Adopting a healthy lifestyle and getting preventive care can go a long way to promote health and wellness. Talk with your health care provider about what schedule of regular examinations is right for you. This is a good chance for you to check in with your provider about disease prevention and staying healthy. In between checkups, there are plenty of things you can do on your own. Experts have done a lot of research about which lifestyle changes and preventive measures are most likely to keep you healthy. Ask your health care provider for more information. WEIGHT AND DIET  Eat a healthy diet  Be sure to include plenty of vegetables, fruits, low-fat dairy products, and lean protein.  Do not eat a lot of foods high in solid fats, added sugars, or salt.  Get regular exercise. This is one of the most important things you can do for your health.  Most adults should exercise for at least 150 minutes each week. The exercise should increase your heart rate and make you sweat (moderate-intensity exercise).  Most adults should also do strengthening exercises at least twice a week. This is in addition to the moderate-intensity exercise.  Maintain a healthy weight  Body mass index (BMI) is a measurement that can be used to identify possible weight problems. It estimates body fat based on height and weight. Your health care provider can help determine your BMI and help you achieve or maintain a healthy weight.  For females 20 years of age and older:   A BMI below 18.5 is considered underweight.  A BMI of 18.5 to 24.9 is normal.  A BMI of 25 to 29.9 is considered overweight.  A BMI of 30 and above is considered obese.  Watch levels of cholesterol and blood lipids  You should start having your blood tested for lipids and cholesterol at 69 years of age, then have this test every 5 years.  You may need to have your cholesterol levels checked more often if:  Your lipid  or cholesterol levels are high.  You are older than 69 years of age.  You are at high risk for heart disease.  CANCER SCREENING   Lung Cancer  Lung cancer screening is recommended for adults 55-80 years old who are at high risk for lung cancer because of a history of smoking.  A yearly low-dose CT scan of the lungs is recommended for people who:  Currently smoke.  Have quit within the past 15 years.  Have at least a 30-pack-year history of smoking. A pack year is smoking an average of one pack of cigarettes a day for 1 year.  Yearly screening should continue until it has been 15 years since you quit.  Yearly screening should stop if you develop a health problem that would prevent you from having lung cancer treatment.  Breast Cancer  Practice breast self-awareness. This means understanding how your breasts normally appear and feel.  It also means doing regular breast self-exams. Let your health care provider know about any changes, no matter how small.  If you are in your 20s or 30s, you should have a clinical breast exam (CBE) by a health care provider every 1-3 years as part of a regular health exam.  If you are 40 or older, have a CBE every year. Also consider having a breast X-ray (mammogram) every year.  If you have a family history of breast cancer, talk to your health care provider about genetic screening.  If you   are at high risk for breast cancer, talk to your health care provider about having an MRI and a mammogram every year.  Breast cancer gene (BRCA) assessment is recommended for women who have family members with BRCA-related cancers. BRCA-related cancers include:  Breast.  Ovarian.  Tubal.  Peritoneal cancers.  Results of the assessment will determine the need for genetic counseling and BRCA1 and BRCA2 testing. Cervical Cancer Your health care provider may recommend that you be screened regularly for cancer of the pelvic organs (ovaries, uterus, and  vagina). This screening involves a pelvic examination, including checking for microscopic changes to the surface of your cervix (Pap test). You may be encouraged to have this screening done every 3 years, beginning at age 21.  For women ages 30-65, health care providers may recommend pelvic exams and Pap testing every 3 years, or they may recommend the Pap and pelvic exam, combined with testing for human papilloma virus (HPV), every 5 years. Some types of HPV increase your risk of cervical cancer. Testing for HPV may also be done on women of any age with unclear Pap test results.  Other health care providers may not recommend any screening for nonpregnant women who are considered low risk for pelvic cancer and who do not have symptoms. Ask your health care provider if a screening pelvic exam is right for you.  If you have had past treatment for cervical cancer or a condition that could lead to cancer, you need Pap tests and screening for cancer for at least 20 years after your treatment. If Pap tests have been discontinued, your risk factors (such as having a new sexual partner) need to be reassessed to determine if screening should resume. Some women have medical problems that increase the chance of getting cervical cancer. In these cases, your health care provider may recommend more frequent screening and Pap tests. Colorectal Cancer  This type of cancer can be detected and often prevented.  Routine colorectal cancer screening usually begins at 69 years of age and continues through 69 years of age.  Your health care provider may recommend screening at an earlier age if you have risk factors for colon cancer.  Your health care provider may also recommend using home test kits to check for hidden blood in the stool.  A small camera at the end of a tube can be used to examine your colon directly (sigmoidoscopy or colonoscopy). This is done to check for the earliest forms of colorectal  cancer.  Routine screening usually begins at age 50.  Direct examination of the colon should be repeated every 5-10 years through 69 years of age. However, you may need to be screened more often if early forms of precancerous polyps or small growths are found. Skin Cancer  Check your skin from head to toe regularly.  Tell your health care provider about any new moles or changes in moles, especially if there is a change in a mole's shape or color.  Also tell your health care provider if you have a mole that is larger than the size of a pencil eraser.  Always use sunscreen. Apply sunscreen liberally and repeatedly throughout the day.  Protect yourself by wearing long sleeves, pants, a wide-brimmed hat, and sunglasses whenever you are outside. HEART DISEASE, DIABETES, AND HIGH BLOOD PRESSURE   High blood pressure causes heart disease and increases the risk of stroke. High blood pressure is more likely to develop in:  People who have blood pressure in the high end   of the normal range (130-139/85-89 mm Hg).  People who are overweight or obese.  People who are African American.  If you are 38-23 years of age, have your blood pressure checked every 3-5 years. If you are 61 years of age or older, have your blood pressure checked every year. You should have your blood pressure measured twice--once when you are at a hospital or clinic, and once when you are not at a hospital or clinic. Record the average of the two measurements. To check your blood pressure when you are not at a hospital or clinic, you can use:  An automated blood pressure machine at a pharmacy.  A home blood pressure monitor.  If you are between 45 years and 39 years old, ask your health care provider if you should take aspirin to prevent strokes.  Have regular diabetes screenings. This involves taking a blood sample to check your fasting blood sugar level.  If you are at a normal weight and have a low risk for diabetes,  have this test once every three years after 68 years of age.  If you are overweight and have a high risk for diabetes, consider being tested at a younger age or more often. PREVENTING INFECTION  Hepatitis B  If you have a higher risk for hepatitis B, you should be screened for this virus. You are considered at high risk for hepatitis B if:  You were born in a country where hepatitis B is common. Ask your health care provider which countries are considered high risk.  Your parents were born in a high-risk country, and you have not been immunized against hepatitis B (hepatitis B vaccine).  You have HIV or AIDS.  You use needles to inject street drugs.  You live with someone who has hepatitis B.  You have had sex with someone who has hepatitis B.  You get hemodialysis treatment.  You take certain medicines for conditions, including cancer, organ transplantation, and autoimmune conditions. Hepatitis C  Blood testing is recommended for:  Everyone born from [birth year] through 1964.  Anyone with known risk factors for hepatitis C. Sexually transmitted infections (STIs)  You should be screened for sexually transmitted infections (STIs) including gonorrhea and chlamydia if:  You are sexually active and are younger than 69 years of age.  You are older than 69 years of age and your health care provider tells you that you are at risk for this type of infection.  Your sexual activity has changed since you were last screened and you are at an increased risk for chlamydia or gonorrhea. Ask your health care provider if you are at risk.  If you do not have HIV, but are at risk, it may be recommended that you take a prescription medicine daily to prevent HIV infection. This is called pre-exposure prophylaxis (PrEP). You are considered at risk if:  You are sexually active and do not regularly use condoms or know the HIV status of your partner(s).  You take drugs by injection.  You are sexually  active with a partner who has HIV. Talk with your health care provider about whether you are at high risk of being infected with HIV. If you choose to begin PrEP, you should first be tested for HIV. You should then be tested every 3 months for as long as you are taking PrEP.  PREGNANCY   If you are premenopausal and you may become pregnant, ask your health care provider about preconception counseling.  If you may  become pregnant, take 400 to 800 micrograms (mcg) of folic acid every day.  If you want to prevent pregnancy, talk to your health care provider about birth control (contraception). OSTEOPOROSIS AND MENOPAUSE   Osteoporosis is a disease in which the bones lose minerals and strength with aging. This can result in serious bone fractures. Your risk for osteoporosis can be identified using a bone density scan.  If you are 61 years of age or older, or if you are at risk for osteoporosis and fractures, ask your health care provider if you should be screened.  Ask your health care provider whether you should take a calcium or vitamin D supplement to lower your risk for osteoporosis.  Menopause may have certain physical symptoms and risks.  Hormone replacement therapy may reduce some of these symptoms and risks. Talk to your health care provider about whether hormone replacement therapy is right for you.  HOME CARE INSTRUCTIONS   Schedule regular health, dental, and eye exams.  Stay current with your immunizations.   Do not use any tobacco products including cigarettes, chewing tobacco, or electronic cigarettes.  If you are pregnant, do not drink alcohol.  If you are breastfeeding, limit how much and how often you drink alcohol.  Limit alcohol intake to no more than 1 drink per day for nonpregnant women. One drink equals 12 ounces of beer, 5 ounces of wine, or 1 ounces of hard liquor.  Do not use street drugs.  Do not share needles.  Ask your health care provider for help if  you need support or information about quitting drugs.  Tell your health care provider if you often feel depressed.  Tell your health care provider if you have ever been abused or do not feel safe at home.   This information is not intended to replace advice given to you by your health care provider. Make sure you discuss any questions you have with your health care provider.   Document Released: 10/14/2010 Document Revised: 04/21/2014 Document Reviewed: 03/02/2013 Elsevier Interactive Patient Education [employer].

## 2015-10-24 NOTE — Progress Notes (Signed)
Pre visit review using our clinic review tool, if applicable. No additional management support is needed unless otherwise documented below in the visit note. 

## 2015-10-24 NOTE — Addendum Note (Signed)
Addended by: Frutoso Chase A on: 10/24/2015 07:55 AM   Modules accepted: Miquel Dunn

## 2015-10-24 NOTE — Assessment & Plan Note (Signed)
General medical exam normal today including breast exam. PAP and pelvic deferred as normal PAP in 2012 and s/p hysterectomy. Mammogram reviewed and UTD. Labs today. Colonoscopy is UTD. Immunizations UTD. Encouraged healthy diet and exercise.

## 2015-10-25 LAB — HEPATITIS C ANTIBODY: HCV Ab: NEGATIVE

## 2015-12-07 DIAGNOSIS — M1711 Unilateral primary osteoarthritis, right knee: Secondary | ICD-10-CM | POA: Diagnosis not present

## 2016-01-28 ENCOUNTER — Ambulatory Visit (INDEPENDENT_AMBULATORY_CARE_PROVIDER_SITE_OTHER): Payer: Medicare Other

## 2016-01-28 DIAGNOSIS — Z23 Encounter for immunization: Secondary | ICD-10-CM | POA: Diagnosis not present

## 2016-01-28 NOTE — Progress Notes (Signed)
Patient came in for flu shot

## 2016-02-08 ENCOUNTER — Telehealth (INDEPENDENT_AMBULATORY_CARE_PROVIDER_SITE_OTHER): Payer: Self-pay | Admitting: Orthopaedic Surgery

## 2016-02-08 NOTE — Telephone Encounter (Signed)
Patient called and states she is ready to proceed with a right knee replacement.

## 2016-02-11 NOTE — Telephone Encounter (Signed)
Please advise 

## 2016-02-12 NOTE — Telephone Encounter (Signed)
PLEASE OBTAIN NAMES OF HER DOCTORS.Marland Kitchen  CARDIOLOGIST

## 2016-02-14 NOTE — Telephone Encounter (Signed)
Pt does not have a cardiologist

## 2016-02-21 ENCOUNTER — Encounter (INDEPENDENT_AMBULATORY_CARE_PROVIDER_SITE_OTHER): Payer: Self-pay

## 2016-02-28 ENCOUNTER — Ambulatory Visit (INDEPENDENT_AMBULATORY_CARE_PROVIDER_SITE_OTHER): Payer: Medicare Other | Admitting: Orthopedic Surgery

## 2016-03-05 ENCOUNTER — Encounter (INDEPENDENT_AMBULATORY_CARE_PROVIDER_SITE_OTHER): Payer: Self-pay | Admitting: Orthopedic Surgery

## 2016-03-05 ENCOUNTER — Ambulatory Visit (INDEPENDENT_AMBULATORY_CARE_PROVIDER_SITE_OTHER): Payer: Medicare Other | Admitting: Orthopedic Surgery

## 2016-03-05 VITALS — BP 147/90 | HR 114 | Temp 99.5°F | Resp 16 | Ht 60.25 in | Wt 173.0 lb

## 2016-03-05 DIAGNOSIS — M1711 Unilateral primary osteoarthritis, right knee: Secondary | ICD-10-CM

## 2016-03-05 NOTE — Progress Notes (Signed)
[name], MD   Biagio Borg, PA-C [address] [place], [place]  [address]                             [phone]   ORTHOPAEDIC HISTORY & [place] MRN:  [id number] DOB/SEX:  31-Oct-1945/female  CHIEF COMPLAINT:  Painful right Knee  HISTORY: Patient is a 70 y.o. female presented with a history of pain in the left knee for 4 years. Onset of symptoms was gradual starting 3 months ago with rapidly worsening course since that time. No prior procedures on the knee.  Patient has been treated conservatively with over-the-counter NSAIDs and activity modification. Patient currently rates pain in the knee at 6-10 out of 10 with activity. There is pain at night. present.  They have been previously treated with: NSAIDS: Motrin, NSAID with mild improvement  Knee injection with corticosteroid  was performed Knee injection with visco supplementation was performed Medications: Motrin with mild improvement  PAST MEDICAL HISTORY: Patient Active Problem List   Diagnosis Date Noted  . Routine general medical examination at a health care facility 10/24/2015  . 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 Osteopenia 08/23/2012  . Medicare annual wellness visit, subsequent 07/19/2012  . Screening for breast cancer 07/19/2012  . 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 Hypertension 10/27/2011  . DUB (dysfunctional uterine bleeding)   . Rosacea    Past Medical History:  Diagnosis Date  . Arthritis   . Diverticulosis   . Headache    occasionally  . Hypertension    takes Lisinopril daily  . Joint pain   . Osteopenia   . Rosacea    Past Surgical History:  Procedure Laterality Date  . CHOLECYSTECTOMY    . CHOLECYSTECTOMY    . COLONOSCOPY    . REFRACTIVE SURGERY    . SHOULDER ARTHROSCOPY WITH OPEN ROTATOR CUFF REPAIR AND DISTAL CLAVICLE ACROMINECTOMY Right 09/19/2014   Procedure: SHOULDER ARTHROSCOPY WITH MINI-OPEN ROTATOR CUFF REPAIR AND DISTAL CLAVICLE  RESECTION, SUBACROMIAL DECOMPRESSION;  Surgeon: Garald Balding, MD;  Location: [facility];  Service: Orthopedics;  Laterality: Right;  . TONSILLECTOMY  at age 56  . VAGINAL HYSTERECTOMY  at age 89     MEDICATIONS PRIOR TO ADMISSION: Prior to Admission medications   Medication Sig Start Date End Date Taking? Authorizing Provider  aspirin 81 MG tablet Take 81 mg by mouth daily.   Yes Historical Provider, MD  Cholecalciferol (VITAMIN D PO) Take 2 tablets by mouth daily.    Yes Historical Provider, MD  Cyanocobalamin (B-12 PO) Take 1 tablet by mouth once a week. Monday   Yes Historical Provider, MD  lisinopril (PRINIVIL,ZESTRIL) 10 MG tablet Take 1 tablet (10 mg total) by mouth daily. TAKE ONE TABLET BY MOUTH EVERY DAY 10/24/15  Yes Jackolyn Confer, MD  LYSINE PO Take 1 tablet by mouth as needed (fever blister).    Yes Historical Provider, MD  magnesium gluconate (MAGONATE) 500 MG tablet Take 250 mg by mouth 2 (two) times daily.   Yes Historical Provider, MD  Multiple Vitamins-Minerals (ALIVE WOMENS ENERGY) TABS Take 1 tablet by mouth.   Yes Historical Provider, MD  potassium phosphate, monobasic, (K-PHOS ORIGINAL) 500 MG tablet Take 595 mg by mouth 4 (four) times daily -  with meals and at bedtime.   Yes Historical Provider, MD  Probiotic Product (PROBIOTIC DAILY PO) Take 1 capsule by mouth daily.    Yes Historical Provider,  MD  Ascorbic Acid (VITAMIN C PO) Take 1 tablet by mouth daily.     Historical Provider, MD  folic acid (FOLVITE) A999333 MCG tablet Take 400 mcg by mouth daily.    Historical Provider, MD  Garlic 10 MG CAPS Take 1 tablet by mouth daily.    Historical Provider, MD  VITAMIN E PO Take 1 capsule by mouth daily.     Historical Provider, MD     ALLERGIES:   Allergies  Allergen Reactions  . Alendronate     Diffuse myalgia and arthralgia  . Aleve [Naproxen Sodium]     Sick to stomach    REVIEW OF SYSTEMS:  Review of Systems  Constitutional: Negative.   HENT: Negative.   Eyes:  Negative.   Respiratory: Negative.   Cardiovascular:       Hypertension for 8 years on lisinopril  Gastrointestinal: Negative.   Genitourinary: Negative.   Musculoskeletal: Negative.   Skin: Negative.   Neurological: Negative.   Endo/Heme/Allergies: Negative.   Psychiatric/Behavioral: Negative.     FAMILY HISTORY:   Family History  Problem Relation Age of Onset  . Hypertension Mother   . Heart disease Mother   . Kidney disease Mother   . Diabetes Father   . Hypertension Father   . Heart disease Father   . Heart disease Brother   . Cancer Maternal Aunt   . Ovarian cancer Maternal Aunt 85  . Breast cancer Neg Hx     SOCIAL HISTORY:   Social History   Occupational History  . Not on file.   Social History Main Topics  . Smoking status: Never Smoker  . Smokeless tobacco: Never Used  . Alcohol use No  . Drug use: No  . Sexual activity: Yes    Birth control/ protection: Post-menopausal, Surgical     EXAMINATION:  Vital signs in last 24 hours: BP (!) 147/90 (BP Location: Left Arm, Patient Position: Sitting, Cuff Size: Large)   Pulse (!) 114   Temp 99.5 F (37.5 C)   Resp 16   Ht 5' 0.25" (1.53 m)   Wt 173 lb (78.5 kg)   BMI 33.51 kg/m   Physical Exam  Constitutional: She is oriented to person, place, and time. She appears well-developed and well-nourished.  HENT:  Head: Normocephalic and atraumatic.  Eyes: EOM are normal. Pupils are equal, round, and reactive to light.  Neck: Neck supple.  No carotid bruits  Cardiovascular: Normal rate, regular rhythm, normal heart sounds and intact distal pulses.   Pulmonary/Chest: Effort normal and breath sounds normal.  Abdominal: Soft. Bowel sounds are normal. She exhibits no mass. There is no tenderness. There is no guarding.  Neurological: She is alert and oriented to person, place, and time.  Skin: Skin is warm and dry.  Psychiatric: She has a normal mood and affect. Her behavior is normal. Judgment and thought  content normal.   Right Knee Exam   Tenderness  The patient is experiencing tenderness in the lateral joint line and medial joint line.  Range of Motion  Right knee extension: 5.  Right knee flexion: 105.   Tests  Varus: negative (OPENS BUT GOOD ENDPOINT) Valgus: positive  Other  Sensation: normal Pulse: present      Imaging Review Plain radiographs demonstrate severe degenerative joint disease of the right knee. The overall alignment is mild varus. The bone quality appears to be good for age and reported activity level.  ASSESSMENT: End stage arthritis, right knee  Past Medical History:  Diagnosis Date  . Arthritis   . Diverticulosis   . Headache    occasionally  . Hypertension    takes Lisinopril daily  . Joint pain   . Osteopenia   . Rosacea     PLAN: Plan for right total knee replacement.  The patient history, physical examination and imaging studies are consistent with advanced degenerative joint disease of the right knee. The patient has failed conservative treatment.  The clearance notes were reviewed.  After discussion with the patient it was felt that Total Knee Replacement was indicated. The procedure,  risks, and benefits of total knee arthroplasty were presented and reviewed. The risks including but not limited to aseptic loosening, infection, blood clots, vascular and nerve injury, stiffness, patella tracking problems and fracture complications among others were discussed. The patient acknowledged the explanation, agreed to proceed with total knee replacement.  Biagio Borg 03/05/2016, 2:16 PM

## 2016-03-05 NOTE — Progress Notes (Deleted)
   Office Visit Note   Patient: Katherine Stanley           Date of Birth: 1946-02-08           MRN: [id number] Visit Date: 03/05/2016              Requested by: [name], MD [address] [place], [place] [address] PCP: [name], MD   Assessment & Plan: Visit Diagnoses: No diagnosis found.  Plan: ***  Follow-Up Instructions: No Follow-up on file.   Orders:  No orders of the defined types were placed in this encounter.  No orders of the defined types were placed in this encounter.     Procedures: No procedures performed   Clinical Data: No additional findings.   Subjective: Chief Complaint  Patient presents with  . Right Knee - Pain    Right knee total knee replacement 03/18/2016    Review of Systems   Objective: Vital Signs: BP (!) 147/90 (BP Location: Left Arm, Patient Position: Sitting, Cuff Size: Large)   Pulse (!) 114   Temp 99.5 F (37.5 C)   Resp 16   Ht 5' 0.25" (1.53 m)   Wt 173 lb (78.5 kg)   BMI 33.51 kg/m   Physical Exam  Ortho Exam  Specialty Comments:  No specialty comments available.  Imaging: No results found.   PMFS History: Patient Active Problem List   Diagnosis Date Noted  . Routine general medical examination at a health care facility 10/24/2015  . 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 Osteopenia 08/23/2012  . Medicare annual wellness visit, subsequent 07/19/2012  . Screening for breast cancer 07/19/2012  . 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 Hypertension 10/27/2011  . DUB (dysfunctional uterine bleeding)   . Rosacea    Past Medical History:  Diagnosis Date  . Arthritis   . Diverticulosis   . Headache    occasionally  . Hypertension    takes Lisinopril daily  . Joint pain   . Osteopenia   . Rosacea     Family History  Problem Relation Age of Onset  . Hypertension Mother   . Heart disease Mother   . Kidney disease Mother   . Diabetes Father   . Hypertension Father     . Heart disease Father   . Heart disease Brother   . Cancer Maternal Aunt   . Ovarian cancer Maternal Aunt 85  . Breast cancer Neg Hx     Past Surgical History:  Procedure Laterality Date  . CHOLECYSTECTOMY    . CHOLECYSTECTOMY    . COLONOSCOPY    . REFRACTIVE SURGERY    . SHOULDER ARTHROSCOPY WITH OPEN ROTATOR CUFF REPAIR AND DISTAL CLAVICLE ACROMINECTOMY Right 09/19/2014   Procedure: SHOULDER ARTHROSCOPY WITH MINI-OPEN ROTATOR CUFF REPAIR AND DISTAL CLAVICLE RESECTION, SUBACROMIAL DECOMPRESSION;  Surgeon: Garald Balding, MD;  Location: [facility];  Service: Orthopedics;  Laterality: Right;  . TONSILLECTOMY  at age 89  . VAGINAL HYSTERECTOMY  at age 25   Social History   Occupational History  . Not on file.   Social History Main Topics  . Smoking status: Never Smoker  . Smokeless tobacco: Never Used  . Alcohol use No  . Drug use: No  . Sexual activity: Yes    Birth control/ protection: Post-menopausal, Surgical

## 2016-03-10 ENCOUNTER — Ambulatory Visit (INDEPENDENT_AMBULATORY_CARE_PROVIDER_SITE_OTHER): Payer: Medicare Other | Admitting: Family Medicine

## 2016-03-10 ENCOUNTER — Encounter: Payer: Self-pay | Admitting: Family Medicine

## 2016-03-10 VITALS — BP 138/88 | HR 103 | Temp 98.2°F | Wt 172.6 lb

## 2016-03-10 DIAGNOSIS — R000 Tachycardia, unspecified: Secondary | ICD-10-CM | POA: Diagnosis not present

## 2016-03-10 DIAGNOSIS — Z13220 Encounter for screening for lipoid disorders: Secondary | ICD-10-CM | POA: Insufficient documentation

## 2016-03-10 DIAGNOSIS — Z01810 Encounter for preprocedural cardiovascular examination: Secondary | ICD-10-CM

## 2016-03-10 LAB — COMPREHENSIVE METABOLIC PANEL
ALBUMIN: 4.2 g/dL (ref 3.5–5.2)
ALT: 15 U/L (ref 0–35)
AST: 15 U/L (ref 0–37)
Alkaline Phosphatase: 57 U/L (ref 39–117)
BILIRUBIN TOTAL: 0.4 mg/dL (ref 0.2–1.2)
BUN: 16 mg/dL (ref 6–23)
CALCIUM: 9.8 mg/dL (ref 8.4–10.5)
CHLORIDE: 108 meq/L (ref 96–112)
CO2: 22 mEq/L (ref 19–32)
CREATININE: 0.68 mg/dL (ref 0.40–1.20)
GFR: 91.15 mL/min (ref >60.00)
Glucose, Bld: 107 mg/dL — ABNORMAL HIGH (ref 70–99)
Potassium: 3.7 mEq/L (ref 3.5–5.1)
Sodium: 140 mEq/L (ref 135–145)
Total Protein: 7.4 g/dL (ref 6.0–8.3)

## 2016-03-10 LAB — CBC
HEMATOCRIT: 41.3 % (ref 36.0–46.0)
Hemoglobin: 13.9 g/dL (ref 12.0–15.0)
MCHC: 33.5 g/dL (ref 30.0–36.0)
MCV: 89.3 fl (ref 78.0–100.0)
Platelets: 317 10*3/uL (ref 150.0–400.0)
RBC: 4.63 Mil/uL (ref 3.87–5.11)
RDW: 12.6 % (ref 11.5–15.5)
WBC: 7.2 10*3/uL (ref 4.0–10.5)

## 2016-03-10 LAB — TSH: TSH: 1.03 u[IU]/mL (ref 0.35–4.50)

## 2016-03-10 NOTE — Patient Instructions (Signed)
Nice to see you. We're going to obtain some lab work to evaluate prior to your surgery. We will also have you see a cardiologist given some nonspecific findings on your EKG in addition to your tachycardia. If you develop palpitations, chest pain, or shortness of breath please seek medical attention immediately.

## 2016-03-10 NOTE — Progress Notes (Signed)
  [name], MD Phone: [phone]  Katherine Stanley is a 70 y.o. female who presents today for preoperative exam.  Patient presents for preoperative exam today. She denies chest pain or breathlessness with climbing 2 flights of stairs. She has no history of kidney disease, MI, irregular heartbeat, stroke, seizures, thyroid dysfunction, liver disease, heart failure, asthma or chronic bronchitis, or diabetes. She has never had any issues with anesthesia. She has no family history of anesthetic issues. She reports no complaints. Her surgery is for right total knee replacement. She is only taking lisinopril at this time.  PMH: nonsmoker.   ROS see history of present illness  Objective  Physical Exam Vitals:   03/10/16 1102 03/10/16 1200  BP: 138/88   Pulse: (!) 114 (!) 103  Temp: 98.2 F (36.8 C)     BP Readings from Last 3 Encounters:  03/10/16 138/88  03/05/16 (!) 147/90  10/24/15 120/78   Wt Readings from Last 3 Encounters:  03/10/16 172 lb 9.6 oz (78.3 kg)  03/05/16 173 lb (78.5 kg)  10/24/15 170 lb 9.6 oz (77.4 kg)    Physical Exam  Constitutional: No distress.  Cardiovascular: Regular rhythm and normal heart sounds.  Tachycardia present.   Pulmonary/Chest: Effort normal and breath sounds normal. No respiratory distress. She has no wheezes. She has no rales.  Abdominal: Soft. Bowel sounds are normal. She exhibits no distension. There is no tenderness.  Musculoskeletal: She exhibits no edema.  Bilateral knees with no edema, warmth, or erythema, no tenderness of the joint lines or soft tissues or other bony structures, no ligamentous laxity, negative McMurray's  Neurological: She is alert.  Skin: Skin is warm and dry. She is not diaphoretic.   EKG: Sinus tachycardia, rate 103, poor R-wave progression, flattening of the T waves in leads 3 and aVF and V3, nonspecific T-wave findings  Assessment/Plan: Please see individual problem list.  Pre-operative  cardiovascular examination Patient evaluated today for preoperative exam prior to total knee replacement. She is asymptomatic today, though is tachycardic. EKG with sinus tachycardia and nonspecific T wave changes. Given these findings we will refer to cardiology for cardiac evaluation prior to surgery. We will obtain lab work as outlined below and then determine medical ability to have surgery. The patient's Lyndel Safe cardiac index score is 0.17%. On the ASQIP risk tool she is average risk for UTI and surgical site infection. Above average risk for VTE and discharge to rehabilitation. Below average risk for other factors. No history of blood clot. Once lab work comes back we will complete her paperwork.   Orders Placed This Encounter  Procedures  . Comp Met (CMET)  . CBC  . TSH  . Ambulatory referral to Cardiology    Referral Priority:   Routine    Referral Type:   Consultation    Referral Reason:   Specialty Services Required    Requested Specialty:   Cardiology    Number of Visits Requested:   1  . EKG 12-Lead    No orders of the defined types were placed in this encounter.   [name], MD [place]

## 2016-03-10 NOTE — Assessment & Plan Note (Signed)
Patient evaluated today for preoperative exam prior to total knee replacement. She is asymptomatic today, though is tachycardic. EKG with sinus tachycardia and nonspecific T wave changes. Given these findings we will refer to cardiology for cardiac evaluation prior to surgery. We will obtain lab work as outlined below and then determine medical ability to have surgery. The patient's Katherine Stanley cardiac index score is 0.17%. On the ASQIP risk tool she is average risk for UTI and surgical site infection. Above average risk for VTE and discharge to rehabilitation. Below average risk for other factors. No history of blood clot. Once lab work comes back we will complete her paperwork.

## 2016-03-10 NOTE — Progress Notes (Signed)
Pre visit review using our clinic review tool, if applicable. No additional management support is needed unless otherwise documented below in the visit note. 

## 2016-03-11 ENCOUNTER — Encounter (HOSPITAL_COMMUNITY): Payer: Self-pay

## 2016-03-11 ENCOUNTER — Other Ambulatory Visit (HOSPITAL_COMMUNITY): Payer: Self-pay | Admitting: *Deleted

## 2016-03-11 NOTE — H&P (Addendum)
[name], MD   Biagio Borg, PA-C [address], [place], [place]  [address]                             [phone]   ORTHOPAEDIC HISTORY & [place] MRN:  [id number] DOB/SEX:  Apr 20, 1945/female  CHIEF COMPLAINT:  Painful right Knee  HISTORY: Patient is a 70 y.o. female presented with a history of pain in the left knee for 4 years. Onset of symptoms was gradual starting 3 months ago with rapidly worsening course since that time. No prior procedures on the knee.  Patient has been treated conservatively with over-the-counter NSAIDs and activity modification. Patient currently rates pain in the knee at 6-10 out of 10 with activity. There is pain at night. present.  They have been previously treated with: NSAIDS: Motrin, NSAID with mild improvement  Knee injection with corticosteroid  was performed Knee injection with visco supplementation was performed Medications: Motrin with mild improvement  PAST MEDICAL HISTORY:     Patient Active Problem List   Diagnosis Date Noted  . Routine general medical examination at a health care facility 10/24/2015  . 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 Osteopenia 08/23/2012  . Medicare annual wellness visit, subsequent 07/19/2012  . Screening for breast cancer 07/19/2012  . 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 Hypertension 10/27/2011  . DUB (dysfunctional uterine bleeding)   . Rosacea        Past Medical History:  Diagnosis Date  . Arthritis   . Diverticulosis   . Headache    occasionally  . Hypertension    takes Lisinopril daily  . Joint pain   . Osteopenia   . Rosacea         Past Surgical History:  Procedure Laterality Date  . CHOLECYSTECTOMY    . CHOLECYSTECTOMY    . COLONOSCOPY    . REFRACTIVE SURGERY    . SHOULDER ARTHROSCOPY WITH OPEN ROTATOR CUFF REPAIR AND DISTAL CLAVICLE ACROMINECTOMY Right 09/19/2014   Procedure: SHOULDER ARTHROSCOPY WITH  MINI-OPEN ROTATOR CUFF REPAIR AND DISTAL CLAVICLE RESECTION, SUBACROMIAL DECOMPRESSION;  Surgeon: Garald Balding, MD;  Location: [facility];  Service: Orthopedics;  Laterality: Right;  . TONSILLECTOMY  at age 59  . VAGINAL HYSTERECTOMY  at age 74     MEDICATIONS PRIOR TO ADMISSION:        Prior to Admission medications   Medication Sig Start Date End Date Taking? Authorizing Provider  aspirin 81 MG tablet Take 81 mg by mouth daily.   Yes Historical Provider, MD  Cholecalciferol (VITAMIN D PO) Take 2 tablets by mouth daily.    Yes Historical Provider, MD  Cyanocobalamin (B-12 PO) Take 1 tablet by mouth once a week. Monday   Yes Historical Provider, MD  lisinopril (PRINIVIL,ZESTRIL) 10 MG tablet Take 1 tablet (10 mg total) by mouth daily. TAKE ONE TABLET BY MOUTH EVERY DAY 10/24/15  Yes Jackolyn Confer, MD  LYSINE PO Take 1 tablet by mouth as needed (fever blister).    Yes Historical Provider, MD  magnesium gluconate (MAGONATE) 500 MG tablet Take 250 mg by mouth 2 (two) times daily.   Yes Historical Provider, MD  Multiple Vitamins-Minerals (ALIVE WOMENS ENERGY) TABS Take 1 tablet by mouth.   Yes Historical Provider, MD  potassium phosphate, monobasic, (K-PHOS ORIGINAL) 500 MG tablet Take 595 mg by mouth 4 (four) times daily -  with meals and at bedtime.   Yes Historical  Provider, MD  Probiotic Product (PROBIOTIC DAILY PO) Take 1 capsule by mouth daily.    Yes Historical Provider, MD  Ascorbic Acid (VITAMIN C PO) Take 1 tablet by mouth daily.     Historical Provider, MD  folic acid (FOLVITE) A999333 MCG tablet Take 400 mcg by mouth daily.    Historical Provider, MD  Garlic 10 MG CAPS Take 1 tablet by mouth daily.    Historical Provider, MD  VITAMIN E PO Take 1 capsule by mouth daily.     Historical Provider, MD     ALLERGIES:   Allergies  Allergen Reactions  . Alendronate     Diffuse myalgia and arthralgia  . Aleve [Naproxen Sodium]     Sick to stomach     REVIEW OF SYSTEMS:  Review of Systems  Constitutional: Negative.   HENT: Negative.   Eyes: Negative.   Respiratory: Negative.   Cardiovascular:       Hypertension for 8 years on lisinopril  Gastrointestinal: Negative.   Genitourinary: Negative.   Musculoskeletal: Negative.   Skin: Negative.   Neurological: Negative.   Endo/Heme/Allergies: Negative.   Psychiatric/Behavioral: Negative.     FAMILY HISTORY:        Family History  Problem Relation Age of Onset  . Hypertension Mother   . Heart disease Mother   . Kidney disease Mother   . Diabetes Father   . Hypertension Father   . Heart disease Father   . Heart disease Brother   . Cancer Maternal Aunt   . Ovarian cancer Maternal Aunt 85  . Breast cancer Neg Hx     SOCIAL HISTORY:   Social History      Occupational History  . Not on file.        Social History Main Topics  . Smoking status: Never Smoker  . Smokeless tobacco: Never Used  . Alcohol use No  . Drug use: No  . Sexual activity: Yes    Birth control/ protection: Post-menopausal, Surgical     EXAMINATION:  Vital signs in last 24 hours: BP (!) 147/90 (BP Location: Left Arm, Patient Position: Sitting, Cuff Size: Large)   Pulse (!) 114   Temp 99.5 F (37.5 C)   Resp 16   Ht 5' 0.25" (1.53 m)   Wt 173 lb (78.5 kg)   BMI 33.51 kg/m   Physical Exam  Constitutional: She is oriented to person, place, and time. She appears well-developed and well-nourished.  HENT:  Head: Normocephalic and atraumatic.  Eyes: EOM are normal. Pupils are equal, round, and reactive to light.  Neck: Neck supple.  No carotid bruits  Cardiovascular: Normal rate, regular rhythm, normal heart sounds and intact distal pulses.   Pulmonary/Chest: Effort normal and breath sounds normal.  Abdominal: Soft. Bowel sounds are normal. She exhibits no mass. There is no tenderness. There is no guarding.  Neurological: She is alert and oriented to person, place,  and time.  Skin: Skin is warm and dry.  Psychiatric: She has a normal mood and affect. Her behavior is normal. Judgment and thought content normal.   Right Knee Exam   Tenderness  The patient is experiencing tenderness in the lateral joint line and medial joint line.  Range of Motion  Right knee extension: 5.  Right knee flexion: 105.   Tests  Varus: negative (OPENS BUT GOOD ENDPOINT) Valgus: positive  Other  Sensation: normal Pulse: present     Imaging Review Plain radiographs demonstrate severe degenerative joint disease of the right  knee. The overall alignment is mild varus. The bone quality appears to be good for age and reported activity level.  ASSESSMENT: End stage arthritis, right knee      Past Medical History:  Diagnosis Date  . Arthritis   . Diverticulosis   . Headache    occasionally  . Hypertension    takes Lisinopril daily  . Joint pain   . Osteopenia   . Rosacea     PLAN: Plan for right total knee replacement.  The patient history, physical examination and imaging studies are consistent with advanced degenerative joint disease of the right knee. The patient has failed conservative treatment.  The clearance notes were reviewed.  After discussion with the patient it was felt that Total Knee Replacement was indicated. The procedure,  risks, and benefits of total knee arthroplasty were presented and reviewed. The risks including but not limited to aseptic loosening, infection, blood clots, vascular and nerve injury, stiffness, patella tracking problems and fracture complications among others were discussed. The patient acknowledged the explanation, agreed to proceed with total knee replacement.  Mike Craze Erwin, [place] [phone]  03/17/2016 11:38 AM

## 2016-03-11 NOTE — Pre-Procedure Instructions (Signed)
Katherine Stanley  03/11/2016     Your procedure is scheduled on Tuesday, March 18, 2016 at 7:15 AM.   Report to [facility] Entrance "A" Admitting Office at 5:30 AM.   Call this number if you have problems the morning of surgery: [phone]   Questions prior to day of surgery, please call [phone] between 8 & 4 PM.    Remember:  Do not eat food or drink liquids after midnight Monday, 03/17/16.  Take these medicines the morning of surgery with A SIP OF WATER: Tylenol - if needed  Stop Aspirin, NSAIDS (Ibuprofen, Aleve, etc.), Multivitamins and Herbal Medications as of today.   Do not wear jewelry, make-up or nail polish.  Do not wear lotions, powders, or perfumes.  Do not shave 48 hours prior to surgery.    Do not bring valuables to the hospital.  [facility] is not responsible for any belongings or valuables.  Contacts, dentures or bridgework may not be worn into surgery.  Leave your suitcase in the car.  After surgery it may be brought to your room.  For patients admitted to the hospital, discharge time will be determined by your treatment team.  Special instructions:   - Preparing for Surgery  Before surgery, you can play an important role.  Because skin is not sterile, your skin needs to be as free of germs as possible.  You can reduce the number of germs on you skin by washing with CHG (chlorahexidine gluconate) soap before surgery.  CHG is an antiseptic cleaner which kills germs and bonds with the skin to continue killing germs even after washing.  Please DO NOT use if you have an allergy to CHG or antibacterial soaps.  If your skin becomes reddened/irritated stop using the CHG and inform your nurse when you arrive at Short Stay.  Do not shave (including legs and underarms) for at least 48 hours prior to the first CHG shower.  You may shave your face.  Please follow these instructions carefully:   1.  Shower with CHG Soap the night before  surgery and the                    morning of Surgery.  2.  If you choose to wash your hair, wash your hair first as usual with your       normal shampoo.  3.  After you shampoo, rinse your hair and body thoroughly to remove the shampoo.  4.  Use CHG as you would any other liquid soap.  You can apply chg directly       to the skin and wash gently with scrungie or a clean washcloth.  5.  Apply the CHG Soap to your body ONLY FROM THE NECK DOWN.        Do not use on open wounds or open sores.  Avoid contact with your eyes, ears, mouth and genitals (private parts).  Wash genitals (private parts) with your normal soap.  6.  Wash thoroughly, paying special attention to the area where your surgery        will be performed.  7.  Thoroughly rinse your body with warm water from the neck down.  8.  DO NOT shower/wash with your normal soap after using and rinsing off       the CHG Soap.  9.  Pat yourself dry with a clean towel.            10.  Wear clean pajamas.            11.  Place clean sheets on your bed the night of your first shower and do not        sleep with pets.  Day of Surgery  Do not apply any lotion the morning of surgery.  Please wear clean clothes to the hospital.   Please read over the fact sheets that you were given.

## 2016-03-12 ENCOUNTER — Encounter (HOSPITAL_COMMUNITY): Payer: Self-pay

## 2016-03-12 ENCOUNTER — Ambulatory Visit (HOSPITAL_COMMUNITY)
Admission: RE | Admit: 2016-03-12 | Discharge: 2016-03-12 | Disposition: A | Discharge status: Home / Self Care | Payer: Medicare Other | Source: Ambulatory Visit | Attending: Orthopedic Surgery | Admitting: Orthopedic Surgery

## 2016-03-12 ENCOUNTER — Encounter (HOSPITAL_COMMUNITY)
Admission: RE | Admit: 2016-03-12 | Discharge: 2016-03-12 | Disposition: A | Discharge status: Home / Self Care | Payer: Medicare Other | Source: Ambulatory Visit | Attending: Orthopaedic Surgery | Admitting: Orthopaedic Surgery

## 2016-03-12 DIAGNOSIS — Z01812 Encounter for preprocedural laboratory examination: Secondary | ICD-10-CM | POA: Diagnosis not present

## 2016-03-12 DIAGNOSIS — Z01818 Encounter for other preprocedural examination: Secondary | ICD-10-CM | POA: Diagnosis not present

## 2016-03-12 LAB — URINALYSIS, ROUTINE W REFLEX MICROSCOPIC
BILIRUBIN URINE: NEGATIVE
Glucose, UA: NEGATIVE mg/dL
Hgb urine dipstick: NEGATIVE
KETONES UR: NEGATIVE mg/dL
Leukocytes, UA: NEGATIVE
NITRITE: NEGATIVE
PH: 5 (ref 5.0–8.0)
Protein, ur: NEGATIVE mg/dL
Specific Gravity, Urine: 1.019 (ref 1.005–1.030)

## 2016-03-12 LAB — SURGICAL PCR SCREEN
MRSA, PCR: NEGATIVE
Staphylococcus aureus: POSITIVE — AB

## 2016-03-12 LAB — TYPE AND SCREEN
ABO/RH(D): O POS
ANTIBODY SCREEN: NEGATIVE

## 2016-03-12 LAB — APTT: aPTT: 26 seconds (ref 24–36)

## 2016-03-12 LAB — PROTIME-INR
INR: 1.04
PROTHROMBIN TIME: 13.6 s (ref 11.4–15.2)

## 2016-03-12 LAB — ABO/RH: ABO/RH(D): O POS

## 2016-03-12 NOTE — Progress Notes (Signed)
Pt states she has no cardiac history and denies any recent chest pain or sob. She was seen by her PCP yesterday and her heart was 114 and they did an EKG which showed sinus tachycardia. Her PCP has made her an appt with Dr. Verl Blalock, Cardiologist for tomorrow 03/13/16. Will forward chart to Anesthesiology NP/PA.  Mupirocin Ointment Rx called into [facility] on [address] in [place] for positive PCR of Staph. Pt notified and voiced understanding.

## 2016-03-13 ENCOUNTER — Ambulatory Visit (INDEPENDENT_AMBULATORY_CARE_PROVIDER_SITE_OTHER): Payer: Medicare Other | Admitting: Cardiology

## 2016-03-13 ENCOUNTER — Encounter: Payer: Self-pay | Admitting: Cardiology

## 2016-03-13 ENCOUNTER — Encounter: Payer: Self-pay | Admitting: Family Medicine

## 2016-03-13 VITALS — BP 152/100 | HR 91 | Ht 61.25 in | Wt 171.2 lb

## 2016-03-13 DIAGNOSIS — Z01810 Encounter for preprocedural cardiovascular examination: Secondary | ICD-10-CM

## 2016-03-13 DIAGNOSIS — R9431 Abnormal electrocardiogram [ECG] [EKG]: Secondary | ICD-10-CM | POA: Diagnosis not present

## 2016-03-13 DIAGNOSIS — I10 Essential (primary) hypertension: Secondary | ICD-10-CM

## 2016-03-13 LAB — URINE CULTURE

## 2016-03-13 NOTE — Patient Instructions (Addendum)
Testing/Procedures: Your physician has requested that you have an echocardiogram. Echocardiography is a painless test that uses sound waves to create images of your heart. It provides your doctor with information about the size and shape of your heart and how well your heart's chambers and valves are working. This procedure takes approximately one hour. There are no restrictions for this procedure.    Follow-Up: Your physician recommends that you schedule a follow-up appointment as needed with Dr. Ingal. We will call you with results and if needed schedule follow up at that time.   It was a pleasure seeing you today here in the office. Please do not hesitate to give us a call back if you have any further questions. [phone]  Pamela A. RN, BSN     Echocardiogram An echocardiogram, or echocardiography, uses sound waves (ultrasound) to produce an image of your heart. The echocardiogram is simple, painless, obtained within a short period of time, and offers valuable information to your health care provider. The images from an echocardiogram can provide information such as:  Evidence of coronary artery disease (CAD).  Heart size.  Heart muscle function.  Heart valve function.  Aneurysm detection.  Evidence of a past heart attack.  Fluid buildup around the heart.  Heart muscle thickening.  Assess heart valve function. Tell a health care provider about:  Any allergies you have.  All medicines you are taking, including vitamins, herbs, eye drops, creams, and over-the-counter medicines.  Any problems you or family members have had with anesthetic medicines.  Any blood disorders you have.  Any surgeries you have had.  Any medical conditions you have.  Whether you are pregnant or may be pregnant. What happens before the procedure? No special preparation is needed. Eat and drink normally. What happens during the procedure?  In order to produce an image of your heart, gel  will be applied to your chest and a wand-like tool (transducer) will be moved over your chest. The gel will help transmit the sound waves from the transducer. The sound waves will harmlessly bounce off your heart to allow the heart images to be captured in real-time motion. These images will then be recorded.  You may need an IV to receive a medicine that improves the quality of the pictures. What happens after the procedure? You may return to your normal schedule including diet, activities, and medicines, unless your health care provider tells you otherwise. This information is not intended to replace advice given to you by your health care provider. Make sure you discuss any questions you have with your health care provider. Document Released: 03/28/2000 Document Revised: 11/17/2015 Document Reviewed: 12/06/2012 Elsevier Interactive Patient Education  2017 [employer]  

## 2016-03-13 NOTE — Progress Notes (Signed)
Anesthesia Chart Review:  Pt is a 70 year old female scheduled for R total knee arthroplasty on 03/18/2016 with [name], MD.   - PCP is [name], MD.   PMH includes:  HTN. Never smoker. BMI 32. S/p shoulder arthroscopy 09/19/14.   BP (!) 146/72   Pulse 99   Temp 36.8 C   Resp 18   Ht 5' 0.25" (1.53 m)   Wt 172 lb 8 oz (78.2 kg)   SpO2 97%   BMI 33.41 kg/m   Medications include: ASA, lisinopril.   Labs 03/10/16 and 03/12/16 reviewed and are acceptable for surgery.   CXR 03/12/16: No active cardiopulmonary disease.  EKG 03/10/16: Sinus  Tachycardia (103 bpm). Poor R-wave progression -nonspecific -consider old anterior infarct.   Pt saw Wende Bushy, MD with cardiology 03/13/16 for pre-op evaluation and was cleared for surgery at low to intermediate risk.  If no changes, I anticipate pt can proceed with surgery as scheduled.   Willeen Cass, FNP-BC [facility]/Anesthesiology Phone: [phone] 03/13/2016 2:23 PM

## 2016-03-13 NOTE — Progress Notes (Signed)
Cardiology Office Note   Date:  03/13/2016   ID:  DAMAYA KEIM, DOB 05-16-1945, MRN [id number]  Referring Doctor:  [name], MD   Cardiologist:   Wende Bushy, MD   Reason for consultation:  Chief Complaint  Patient presents with  . other    Ref. by Dr. Caryl Bis for cardiac clearance for total right knee replacement due to abnormal EKG; scheduled for Tuesday, Mar 18, 2016 with Dr. [name] at [facility]. Denies chest pain or shortness of breath.        History of Present Illness: Katherine Stanley is a 70 y.o. female who presents for Preoperative cardiac evaluation for right knee replacement  Patient denies chest pain and shortness of breath. She can do her grocery at [facility], walked from the parking lot to the office, walk [place] without any chest pain or shortness breath. She can walk at least a flight of stairs with no significant shortness of breath. Patient denies palpitations, PND, orthopnea, edema.   ROS:  Please see the history of present illness. Aside from mentioned under HPI, all other systems are reviewed and negative.     Past Medical History:  Diagnosis Date  . Arthritis   . Diverticulosis   . Headache    occasionally  . Hypertension    takes Lisinopril daily  . Joint pain   . Osteopenia   . Rosacea     Past Surgical History:  Procedure Laterality Date  . CHOLECYSTECTOMY    . CHOLECYSTECTOMY    . COLONOSCOPY    . REFRACTIVE SURGERY    . SHOULDER ARTHROSCOPY WITH OPEN ROTATOR CUFF REPAIR AND DISTAL CLAVICLE ACROMINECTOMY Right 09/19/2014   Procedure: SHOULDER ARTHROSCOPY WITH MINI-OPEN ROTATOR CUFF REPAIR AND DISTAL CLAVICLE RESECTION, SUBACROMIAL DECOMPRESSION;  Surgeon: Garald Balding, MD;  [facility];  Service: Orthopedics;  Laterality: Right;  . TONSILLECTOMY  at age 28  . VAGINAL HYSTERECTOMY  at age 55     reports that she has never smoked. She has never used smokeless tobacco. She reports that  she does not drink alcohol or use drugs.   family history includes Cancer in her maternal aunt; Diabetes in her father; Heart disease in her brother, father, and mother; Hypertension in her father and mother; Kidney disease in her mother; Ovarian cancer (age of onset: 84) in her maternal aunt.   Outpatient Medications Prior to Visit  Medication Sig Dispense Refill  . acetaminophen (TYLENOL) 500 MG tablet Take 1,000 mg by mouth every 6 (six) hours as needed (for pain.).    Marland Kitchen aspirin EC 81 MG tablet Take 81 mg by mouth daily.    . cholecalciferol (VITAMIN D) 1000 units tablet Take 1,000 Units by mouth daily.    . Ibuprofen (ADVIL) 200 MG CAPS Take 400 mg by mouth every 8 (eight) hours as needed (for pain.).    Marland Kitchen lisinopril (PRINIVIL,ZESTRIL) 10 MG tablet Take 1 tablet (10 mg total) by mouth daily. TAKE ONE TABLET BY MOUTH EVERY DAY (Patient taking differently: Take 10 mg by mouth daily. ) 90 tablet 3  . Lysine 1000 MG TABS Take 1,000 mg by mouth daily as needed (for fever blisters).    . Multiple Vitamins-Minerals (ALIVE WOMENS 50+ PO) Take 1 tablet by mouth daily.    . Probiotic Product (PROBIOTIC DAILY PO) Take 1 capsule by mouth daily.     Marland Kitchen Propylene Glycol-Glycerin (SOOTHE) 0.6-0.6 % SOLN Place 1 drop into both eyes daily.    Marland Kitchen  vitamin B-12 (CYANOCOBALAMIN) 1000 MCG tablet Take 1,000 mcg by mouth daily.     No facility-administered medications prior to visit.      Allergies: Alendronate and Aleve [naproxen sodium]    PHYSICAL EXAM: VS:  BP (!) 152/100 (BP Location: Right Arm, Patient Position: Sitting, Cuff Size: Normal)   Pulse 91   Ht 5' 1.25" (1.556 m)   Wt 171 lb 4 oz (77.7 kg)   BMI 32.09 kg/m  , Body mass index is 32.09 kg/m. Wt Readings from Last 3 Encounters:  03/13/16 171 lb 4 oz (77.7 kg)  03/12/16 172 lb 8 oz (78.2 kg)  03/10/16 172 lb 9.6 oz (78.3 kg)    GENERAL:  well developed, well nourished, obese, not in acute distress HEENT: normocephalic, pink conjunctivae,  anicteric sclerae, no xanthelasma, normal dentition, oropharynx clear NECK:  no neck vein engorgement, JVP normal, no hepatojugular reflux, carotid upstroke brisk and symmetric, no bruit, no thyromegaly, no lymphadenopathy LUNGS:  good respiratory effort, clear to auscultation bilaterally CV:  PMI not displaced, no thrills, no lifts, S1 and S2 within normal limits, no palpable S3 or S4, no murmurs, no rubs, no gallops ABD:  Soft, nontender, nondistended, normoactive bowel sounds, no abdominal aortic bruit, no hepatomegaly, no splenomegaly MS: nontender back, no kyphosis, no scoliosis, no joint deformities EXT:  2+ DP/PT pulses, no edema, no varicosities, no cyanosis, no clubbing SKIN: warm, nondiaphoretic, normal turgor, no ulcers NEUROPSYCH: alert, oriented to person, place, and time, sensory/motor grossly intact, normal mood, appropriate affect  Recent Labs: 03/10/2016: ALT 15; BUN 16; Creatinine, Ser 0.68; Hemoglobin 13.9; Platelets 317.0; Potassium 3.7; Sodium 140; TSH 1.03   Lipid Panel    Component Value Date/Time   CHOL 177 10/24/2015 0755   TRIG 115.0 10/24/2015 0755   HDL 59.20 10/24/2015 0755   CHOLHDL 3 10/24/2015 0755   VLDL 23.0 10/24/2015 0755   LDLCALC 94 10/24/2015 0755     Other studies Reviewed:  EKG:  The ekg from 04/12/2016 was personally reviewed by me and it revealed sinus rhythm, 91 BPM. EKG from 03/10/2016 shows poor R-wave progression.  Additional studies/ records that were reviewed personally reviewed by me today include: None available   ASSESSMENT AND PLAN:  Preoperative cardiac evaluation Questionable history of abnormal EKG, EKG today is normal sinus rhythm No symptoms of chest pain or shortness of breath. No evidence of congestive heart failure in examination. No evidence of arrhythmia. Recommend optimal blood pressure control through PCP. No indication for cardiac testing prior to surgery. Patient's cardiac risk is low to intermediate for moderate  risk surgery. Recommend echocardiogram, patient prefers to do this after knee surgery.  HTN BP slightly elevated, but patient feeling stressed out over preparation for surgery, little anxious today. At home, blood pressures in the 120s over 70s to 80s. Continue to follow with PCP. Resume aspirin 81 mg by mouth daily once okay post op.  Current medicines are reviewed at length with the patient today.  The patient does not have concerns regarding medicines.  Labs/ tests ordered today include:  Orders Placed This Encounter  Procedures  . EKG 12-Lead  . ECHOCARDIOGRAM COMPLETE    I had a lengthy and detailed discussion with the patient regarding diagnoses, prognosis, diagnostic options, treatment options , and side effects of medications.   I counseled the patient on importance of lifestyle modification including heart healthy diet, regular physical activity .   Disposition:   FU with undersigned after tests When necessary  I spent at  least 45 minutes with the patient today and more than 50% of the time was spent counseling the patient and coordinating care.    Signed, Wende Bushy, MD  03/13/2016 1:16 PM    [facility]  This note was generated in part with voice recognition software and I apologize for any typographical errors that were not detected and corrected.

## 2016-03-17 MED ORDER — CEFAZOLIN SODIUM-DEXTROSE 2-4 GM/100ML-% IV SOLN
2.0000 g | INTRAVENOUS | Status: AC
  Administered 2016-03-18: 2 g via INTRAVENOUS
  Filled 2016-03-17 (×2): qty 100

## 2016-03-17 MED ORDER — ACETAMINOPHEN 10 MG/ML IV SOLN
1000.0000 mg | Freq: Once | INTRAVENOUS | Status: AC
  Administered 2016-03-18: 1000 mg via INTRAVENOUS
  Filled 2016-03-17: qty 100

## 2016-03-17 MED ORDER — TRANEXAMIC ACID 1000 MG/10ML IV SOLN
2000.0000 mg | INTRAVENOUS | Status: AC
  Administered 2016-03-18: 2000 mg via TOPICAL
  Filled 2016-03-17: qty 20

## 2016-03-17 MED ORDER — SODIUM CHLORIDE 0.9 % IV SOLN: INTRAVENOUS | Status: DC

## 2016-03-17 NOTE — Anesthesia Preprocedure Evaluation (Addendum)
Anesthesia Evaluation  Patient identified by MRN, date of birth, ID band Patient awake    Reviewed: Allergy & Precautions, NPO status , Patient's Chart, lab work & pertinent test results  Airway Mallampati: I  TM Distance: >3 FB Neck ROM: Full    Dental  (+) Teeth Intact, Dental Advidsory Given   Pulmonary neg pulmonary ROS,    breath sounds clear to auscultation       Cardiovascular hypertension, Pt. on medications (-) angina Rhythm:Regular Rate:Normal  Cardiology: low risk for TKR   Neuro/Psych  Headaches,    GI/Hepatic negative GI ROS, Neg liver ROS,   Endo/Other  Morbid obesity  Renal/GU negative Renal ROS     Musculoskeletal  (+) Arthritis , Osteoarthritis,    Abdominal (+) + obese,   Peds  Hematology negative hematology ROS (+)   Anesthesia Other Findings   Reproductive/Obstetrics                           Anesthesia Physical Anesthesia Plan  ASA: II  Anesthesia Plan: Spinal   Post-op Pain Management:  Regional for Post-op pain   Induction:   Airway Management Planned: Natural Airway and Simple Face Mask  Additional Equipment:   Intra-op Plan:   Post-operative Plan:   Informed Consent: I have reviewed the patients History and Physical, chart, labs and discussed the procedure including the risks, benefits and alternatives for the proposed anesthesia with the patient or authorized representative who has indicated his/her understanding and acceptance.   Dental Advisory Given  Plan Discussed with: Surgeon and CRNA  Anesthesia Plan Comments: (Plan routine monitors, SAB with adductor canal block for post op analgesia)       Anesthesia Quick Evaluation

## 2016-03-18 ENCOUNTER — Inpatient Hospital Stay (HOSPITAL_COMMUNITY): Payer: Medicare Other | Admitting: Anesthesiology

## 2016-03-18 ENCOUNTER — Encounter (HOSPITAL_COMMUNITY)
Admission: RE | Disposition: A | Discharge status: Home Health | Payer: Self-pay | Source: Ambulatory Visit | Attending: Orthopaedic Surgery

## 2016-03-18 ENCOUNTER — Inpatient Hospital Stay (HOSPITAL_COMMUNITY): Payer: Medicare Other | Admitting: Emergency Medicine

## 2016-03-18 ENCOUNTER — Inpatient Hospital Stay (HOSPITAL_COMMUNITY)
Admission: RE | Admit: 2016-03-18 | Discharge: 2016-03-20 | DRG: 470 | Disposition: A | Discharge status: Home Health | Payer: Medicare Other | Source: Ambulatory Visit | Attending: Orthopaedic Surgery | Admitting: Orthopaedic Surgery

## 2016-03-18 ENCOUNTER — Encounter (HOSPITAL_COMMUNITY): Payer: Self-pay | Admitting: *Deleted

## 2016-03-18 DIAGNOSIS — Z7982 Long term (current) use of aspirin: Secondary | ICD-10-CM | POA: Diagnosis not present

## 2016-03-18 DIAGNOSIS — Z79899 Other long term (current) drug therapy: Secondary | ICD-10-CM

## 2016-03-18 DIAGNOSIS — M1711 Unilateral primary osteoarthritis, right knee: Principal | ICD-10-CM | POA: Diagnosis present

## 2016-03-18 DIAGNOSIS — R51 Headache: Secondary | ICD-10-CM | POA: Diagnosis present

## 2016-03-18 DIAGNOSIS — Z8249 Family history of ischemic heart disease and other diseases of the circulatory system: Secondary | ICD-10-CM | POA: Diagnosis not present

## 2016-03-18 DIAGNOSIS — Z888 Allergy status to other drugs, medicaments and biological substances status: Secondary | ICD-10-CM

## 2016-03-18 DIAGNOSIS — Z96651 Presence of right artificial knee joint: Secondary | ICD-10-CM

## 2016-03-18 DIAGNOSIS — G8918 Other acute postprocedural pain: Secondary | ICD-10-CM | POA: Diagnosis not present

## 2016-03-18 DIAGNOSIS — M8580 Other specified disorders of bone density and structure, unspecified site: Secondary | ICD-10-CM | POA: Diagnosis not present

## 2016-03-18 DIAGNOSIS — Z6832 Body mass index (BMI) 32.0-32.9, adult: Secondary | ICD-10-CM | POA: Diagnosis not present

## 2016-03-18 DIAGNOSIS — I10 Essential (primary) hypertension: Secondary | ICD-10-CM | POA: Diagnosis present

## 2016-03-18 DIAGNOSIS — M25561 Pain in right knee: Secondary | ICD-10-CM | POA: Diagnosis not present

## 2016-03-18 HISTORY — PX: TOTAL KNEE ARTHROPLASTY: SHX125

## 2016-03-18 LAB — COMPREHENSIVE METABOLIC PANEL
ALK PHOS: 49 U/L (ref 38–126)
ALT: 20 U/L (ref 14–54)
ANION GAP: 11 (ref 5–15)
AST: 21 U/L (ref 15–41)
Albumin: 3.9 g/dL (ref 3.5–5.0)
BILIRUBIN TOTAL: 0.6 mg/dL (ref 0.3–1.2)
BUN: 10 mg/dL (ref 6–20)
CALCIUM: 9.4 mg/dL (ref 8.9–10.3)
CO2: 18 mmol/L — ABNORMAL LOW (ref 22–32)
CREATININE: 0.58 mg/dL (ref 0.44–1.00)
Chloride: 112 mmol/L — ABNORMAL HIGH (ref 101–111)
GFR calc non Af Amer: >60 mL/min (ref >60)
Glucose, Bld: 112 mg/dL — ABNORMAL HIGH (ref 65–99)
Potassium: 3.5 mmol/L (ref 3.5–5.1)
Sodium: 141 mmol/L (ref 135–145)
TOTAL PROTEIN: 6.7 g/dL (ref 6.5–8.1)

## 2016-03-18 LAB — CBC WITH DIFFERENTIAL/PLATELET
Basophils Absolute: 0.1 10*3/uL (ref 0.0–0.1)
Basophils Relative: 1 %
Eosinophils Absolute: 0.1 10*3/uL (ref 0.0–0.7)
Eosinophils Relative: 2 %
HEMATOCRIT: 40.5 % (ref 36.0–46.0)
HEMOGLOBIN: 14 g/dL (ref 12.0–15.0)
LYMPHS ABS: 2.5 10*3/uL (ref 0.7–4.0)
LYMPHS PCT: 39 %
MCH: 30.9 pg (ref 26.0–34.0)
MCHC: 34.6 g/dL (ref 30.0–36.0)
MCV: 89.4 fL (ref 78.0–100.0)
MONOS PCT: 7 %
Monocytes Absolute: 0.5 10*3/uL (ref 0.1–1.0)
NEUTROS ABS: 3.3 10*3/uL (ref 1.7–7.7)
NEUTROS PCT: 51 %
Platelets: 271 10*3/uL (ref 150–400)
RBC: 4.53 MIL/uL (ref 3.87–5.11)
RDW: 12.7 % (ref 11.5–15.5)
WBC: 6.5 10*3/uL (ref 4.0–10.5)

## 2016-03-18 SURGERY — ARTHROPLASTY, KNEE, TOTAL
Anesthesia: Choice | Site: Knee | Laterality: Right

## 2016-03-18 MED ORDER — POLYVINYL ALCOHOL 1.4 % OP SOLN
1.0000 [drp] | Freq: Every day | OPHTHALMIC | Status: DC
  Filled 2016-03-18: qty 15

## 2016-03-18 MED ORDER — PROPOFOL 500 MG/50ML IV EMUL
INTRAVENOUS | Status: DC | PRN
  Administered 2016-03-18: 09:00:00 via INTRAVENOUS
  Administered 2016-03-18: 75 ug/kg/min via INTRAVENOUS

## 2016-03-18 MED ORDER — MENTHOL 3 MG MT LOZG: 1.0000 | LOZENGE | OROMUCOSAL | Status: DC | PRN

## 2016-03-18 MED ORDER — PHENYLEPHRINE HCL 10 MG/ML IJ SOLN
INTRAMUSCULAR | Status: DC | PRN
  Administered 2016-03-18: 25 ug/min via INTRAVENOUS

## 2016-03-18 MED ORDER — ONDANSETRON HCL 4 MG/2ML IJ SOLN
4.0000 mg | Freq: Four times a day (QID) | INTRAMUSCULAR | Status: DC | PRN
  Administered 2016-03-18: 4 mg via INTRAVENOUS
  Filled 2016-03-18: qty 2

## 2016-03-18 MED ORDER — MAGNESIUM CITRATE PO SOLN: 1.0000 | Freq: Once | ORAL | Status: DC | PRN

## 2016-03-18 MED ORDER — MIDAZOLAM HCL 2 MG/2ML IJ SOLN: 0.5000 mg | Freq: Once | INTRAMUSCULAR | Status: DC | PRN

## 2016-03-18 MED ORDER — RIVAROXABAN 10 MG PO TABS
10.0000 mg | ORAL_TABLET | Freq: Every day | ORAL | Status: DC
  Administered 2016-03-19 – 2016-03-20 (×2): 10 mg via ORAL
  Filled 2016-03-18 (×2): qty 1

## 2016-03-18 MED ORDER — MIDAZOLAM HCL 2 MG/2ML IJ SOLN
INTRAMUSCULAR | Status: AC
  Filled 2016-03-18: qty 2

## 2016-03-18 MED ORDER — DIPHENHYDRAMINE HCL 12.5 MG/5ML PO ELIX: 12.5000 mg | ORAL_SOLUTION | ORAL | Status: DC | PRN

## 2016-03-18 MED ORDER — CEFAZOLIN SODIUM-DEXTROSE 2-4 GM/100ML-% IV SOLN
2.0000 g | Freq: Four times a day (QID) | INTRAVENOUS | Status: AC
  Administered 2016-03-18 (×2): 2 g via INTRAVENOUS
  Filled 2016-03-18 (×2): qty 100

## 2016-03-18 MED ORDER — ROPIVACAINE HCL 7.5 MG/ML IJ SOLN: INTRAMUSCULAR | Status: DC | PRN

## 2016-03-18 MED ORDER — FENTANYL CITRATE (PF) 100 MCG/2ML IJ SOLN
INTRAMUSCULAR | Status: DC | PRN
  Administered 2016-03-18 (×2): 50 ug via INTRAVENOUS

## 2016-03-18 MED ORDER — ALUM & MAG HYDROXIDE-SIMETH 200-200-20 MG/5ML PO SUSP: 30.0000 mL | ORAL | Status: DC | PRN

## 2016-03-18 MED ORDER — ROPIVACAINE HCL 7.5 MG/ML IJ SOLN
INTRAMUSCULAR | Status: DC | PRN
  Administered 2016-03-18: 20 mL via PERINEURAL

## 2016-03-18 MED ORDER — ONDANSETRON HCL 4 MG PO TABS
4.0000 mg | ORAL_TABLET | Freq: Four times a day (QID) | ORAL | Status: DC | PRN
  Filled 2016-03-18: qty 1

## 2016-03-18 MED ORDER — HYDROMORPHONE HCL 2 MG/ML IJ SOLN
0.5000 mg | INTRAMUSCULAR | Status: DC | PRN
  Administered 2016-03-20: 0.5 mg via INTRAVENOUS
  Filled 2016-03-18: qty 1

## 2016-03-18 MED ORDER — PROMETHAZINE HCL 25 MG/ML IJ SOLN: 6.2500 mg | INTRAMUSCULAR | Status: DC | PRN

## 2016-03-18 MED ORDER — METOCLOPRAMIDE HCL 5 MG PO TABS: 5.0000 mg | ORAL_TABLET | Freq: Three times a day (TID) | ORAL | Status: DC | PRN

## 2016-03-18 MED ORDER — BISACODYL 10 MG RE SUPP: 10.0000 mg | Freq: Every day | RECTAL | Status: DC | PRN

## 2016-03-18 MED ORDER — PHENOL 1.4 % MT LIQD: 1.0000 | OROMUCOSAL | Status: DC | PRN

## 2016-03-18 MED ORDER — LACTATED RINGERS IV SOLN
INTRAVENOUS | Status: DC | PRN
  Administered 2016-03-18 (×2): via INTRAVENOUS

## 2016-03-18 MED ORDER — OXYCODONE HCL 5 MG PO TABS
ORAL_TABLET | ORAL | Status: AC
  Filled 2016-03-18: qty 1

## 2016-03-18 MED ORDER — FENTANYL CITRATE (PF) 100 MCG/2ML IJ SOLN
INTRAMUSCULAR | Status: AC
  Filled 2016-03-18: qty 2

## 2016-03-18 MED ORDER — VITAMIN D 1000 UNITS PO TABS
1000.0000 [IU] | ORAL_TABLET | Freq: Every day | ORAL | Status: DC
  Administered 2016-03-19 – 2016-03-20 (×2): 1000 [IU] via ORAL
  Filled 2016-03-18 (×3): qty 1

## 2016-03-18 MED ORDER — LYSINE 1000 MG PO TABS: 1000.0000 mg | ORAL_TABLET | Freq: Every day | ORAL | Status: DC | PRN

## 2016-03-18 MED ORDER — OXYCODONE HCL 5 MG PO TABS
5.0000 mg | ORAL_TABLET | ORAL | Status: DC | PRN
  Administered 2016-03-18 (×3): 10 mg via ORAL
  Administered 2016-03-18: 5 mg via ORAL
  Administered 2016-03-19 – 2016-03-20 (×7): 10 mg via ORAL
  Filled 2016-03-18 (×10): qty 2

## 2016-03-18 MED ORDER — CHLORHEXIDINE GLUCONATE 4 % EX LIQD: 60.0000 mL | Freq: Once | CUTANEOUS | Status: DC

## 2016-03-18 MED ORDER — MEPERIDINE HCL 25 MG/ML IJ SOLN: 6.2500 mg | INTRAMUSCULAR | Status: DC | PRN

## 2016-03-18 MED ORDER — METHOCARBAMOL 500 MG PO TABS
ORAL_TABLET | ORAL | Status: AC
  Filled 2016-03-18: qty 1

## 2016-03-18 MED ORDER — POLYETHYLENE GLYCOL 3350 17 G PO PACK: 17.0000 g | PACK | Freq: Every day | ORAL | Status: DC | PRN

## 2016-03-18 MED ORDER — KETOROLAC TROMETHAMINE 15 MG/ML IJ SOLN
7.5000 mg | Freq: Four times a day (QID) | INTRAMUSCULAR | Status: AC
  Administered 2016-03-18 – 2016-03-19 (×4): 7.5 mg via INTRAVENOUS
  Filled 2016-03-18 (×4): qty 1

## 2016-03-18 MED ORDER — BUPIVACAINE-EPINEPHRINE 0.25% -1:200000 IJ SOLN
INTRAMUSCULAR | Status: DC | PRN
  Administered 2016-03-18: 30 mL

## 2016-03-18 MED ORDER — VITAMIN B-12 1000 MCG PO TABS
1000.0000 ug | ORAL_TABLET | Freq: Every day | ORAL | Status: DC
  Administered 2016-03-19 – 2016-03-20 (×2): 1000 ug via ORAL
  Filled 2016-03-18 (×3): qty 1

## 2016-03-18 MED ORDER — DEXTROSE 5 % IV SOLN
500.0000 mg | Freq: Four times a day (QID) | INTRAVENOUS | Status: DC | PRN
  Filled 2016-03-18: qty 5

## 2016-03-18 MED ORDER — LISINOPRIL 10 MG PO TABS
10.0000 mg | ORAL_TABLET | Freq: Every day | ORAL | Status: DC
  Administered 2016-03-18 – 2016-03-20 (×3): 10 mg via ORAL
  Filled 2016-03-18 (×3): qty 1

## 2016-03-18 MED ORDER — 0.9 % SODIUM CHLORIDE (POUR BTL) OPTIME
TOPICAL | Status: DC | PRN
  Administered 2016-03-18: 1000 mL

## 2016-03-18 MED ORDER — METOCLOPRAMIDE HCL 5 MG/ML IJ SOLN
5.0000 mg | Freq: Three times a day (TID) | INTRAMUSCULAR | Status: DC | PRN
  Administered 2016-03-18: 10 mg via INTRAVENOUS
  Filled 2016-03-18: qty 2

## 2016-03-18 MED ORDER — METHOCARBAMOL 500 MG PO TABS
500.0000 mg | ORAL_TABLET | Freq: Four times a day (QID) | ORAL | Status: DC | PRN
  Administered 2016-03-18 – 2016-03-20 (×6): 500 mg via ORAL
  Filled 2016-03-18 (×5): qty 1

## 2016-03-18 MED ORDER — BUPIVACAINE IN DEXTROSE 0.75-8.25 % IT SOLN
INTRATHECAL | Status: DC | PRN
  Administered 2016-03-18: 12 mg via INTRATHECAL

## 2016-03-18 MED ORDER — ACETAMINOPHEN 10 MG/ML IV SOLN
1000.0000 mg | Freq: Four times a day (QID) | INTRAVENOUS | Status: AC
  Administered 2016-03-18 – 2016-03-19 (×4): 1000 mg via INTRAVENOUS
  Filled 2016-03-18 (×4): qty 100

## 2016-03-18 MED ORDER — MIDAZOLAM HCL 5 MG/5ML IJ SOLN
INTRAMUSCULAR | Status: DC | PRN
  Administered 2016-03-18: 2 mg via INTRAVENOUS

## 2016-03-18 MED ORDER — HYDROMORPHONE HCL 2 MG/ML IJ SOLN
INTRAMUSCULAR | Status: AC
  Filled 2016-03-18: qty 1

## 2016-03-18 MED ORDER — PHENYLEPHRINE HCL 10 MG/ML IJ SOLN
INTRAMUSCULAR | Status: DC | PRN
  Administered 2016-03-18 (×2): 40 ug via INTRAVENOUS
  Administered 2016-03-18 (×2): 80 ug via INTRAVENOUS

## 2016-03-18 MED ORDER — DOCUSATE SODIUM 100 MG PO CAPS
100.0000 mg | ORAL_CAPSULE | Freq: Two times a day (BID) | ORAL | Status: DC
  Administered 2016-03-18 – 2016-03-20 (×5): 100 mg via ORAL
  Filled 2016-03-18 (×5): qty 1

## 2016-03-18 MED ORDER — HYDROMORPHONE HCL 1 MG/ML IJ SOLN
0.2500 mg | INTRAMUSCULAR | Status: DC | PRN
  Administered 2016-03-18 (×4): 0.5 mg via INTRAVENOUS

## 2016-03-18 MED ORDER — PROPOFOL 10 MG/ML IV BOLUS
INTRAVENOUS | Status: AC
  Filled 2016-03-18: qty 20

## 2016-03-18 MED ORDER — ACETAMINOPHEN 10 MG/ML IV SOLN
INTRAVENOUS | Status: AC
  Filled 2016-03-18: qty 100

## 2016-03-18 MED ORDER — SODIUM CHLORIDE 0.9 % IV SOLN
INTRAVENOUS | Status: DC
  Administered 2016-03-18 – 2016-03-19 (×2): via INTRAVENOUS

## 2016-03-18 SURGICAL SUPPLY — 63 items
BAG DECANTER FOR FLEXI CONT (MISCELLANEOUS) ×2 IMPLANT
BANDAGE ESMARK 6X9 LF (GAUZE/BANDAGES/DRESSINGS) ×1 IMPLANT
BLADE SAGITTAL 25.0X1.19X90 (BLADE) ×2 IMPLANT
BNDG CMPR 9X6 STRL LF SNTH (GAUZE/BANDAGES/DRESSINGS) ×1
BNDG ESMARK 6X9 LF (GAUZE/BANDAGES/DRESSINGS) ×2
BOWL SMART MIX CTS (DISPOSABLE) ×2 IMPLANT
CAP KNEE TOTAL 3 SIGMA ×1 IMPLANT
CEMENT HV SMART SET (Cement) ×4 IMPLANT
COVER SURGICAL LIGHT HANDLE (MISCELLANEOUS) ×2 IMPLANT
CUFF TOURNIQUET SINGLE 34IN LL (TOURNIQUET CUFF) ×1 IMPLANT
CUFF TOURNIQUET SINGLE 44IN (TOURNIQUET CUFF) IMPLANT
DECANTER SPIKE VIAL GLASS SM (MISCELLANEOUS) ×2 IMPLANT
DRAPE EXTREMITY T 121X128X90 (DRAPE) IMPLANT
DRAPE HALF SHEET 40X57 (DRAPES) ×2 IMPLANT
DRAPE PROXIMA HALF (DRAPES) ×2 IMPLANT
DRSG ADAPTIC 3X8 NADH LF (GAUZE/BANDAGES/DRESSINGS) ×2 IMPLANT
DRSG PAD ABDOMINAL 8X10 ST (GAUZE/BANDAGES/DRESSINGS) ×3 IMPLANT
DURAPREP 26ML APPLICATOR (WOUND CARE) ×4 IMPLANT
ELECT CAUTERY BLADE 6.4 (BLADE) ×2 IMPLANT
ELECT REM PT RETURN 9FT ADLT (ELECTROSURGICAL) ×2
ELECTRODE REM PT RTRN 9FT ADLT (ELECTROSURGICAL) ×1 IMPLANT
EVACUATOR 1/8 PVC DRAIN (DRAIN) IMPLANT
FACESHIELD WRAPAROUND (MASK) ×4 IMPLANT
FACESHIELD WRAPAROUND OR TEAM (MASK) ×2 IMPLANT
GAUZE SPONGE 4X4 12PLY STRL (GAUZE/BANDAGES/DRESSINGS) ×2 IMPLANT
GLOVE BIOGEL PI IND STRL 8 (GLOVE) ×1 IMPLANT
GLOVE BIOGEL PI IND STRL 8.5 (GLOVE) ×1 IMPLANT
GLOVE BIOGEL PI INDICATOR 8 (GLOVE) ×2
GLOVE BIOGEL PI INDICATOR 8.5 (GLOVE) ×1
GLOVE ECLIPSE 8.0 STRL XLNG CF (GLOVE) ×4 IMPLANT
GLOVE SURG ORTHO 8.5 STRL (GLOVE) ×4 IMPLANT
GOWN STRL REUS W/ TWL LRG LVL3 (GOWN DISPOSABLE) ×2 IMPLANT
GOWN STRL REUS W/TWL 2XL LVL3 (GOWN DISPOSABLE) ×2 IMPLANT
GOWN STRL REUS W/TWL LRG LVL3 (GOWN DISPOSABLE) ×4
HANDPIECE INTERPULSE COAX TIP (DISPOSABLE) ×2
KIT BASIN OR (CUSTOM PROCEDURE TRAY) ×2 IMPLANT
KIT ROOM TURNOVER OR (KITS) ×2 IMPLANT
MANIFOLD NEPTUNE II (INSTRUMENTS) ×2 IMPLANT
NEEDLE 22X1 1/2 (OR ONLY) (NEEDLE) ×2 IMPLANT
NS IRRIG 1000ML POUR BTL (IV SOLUTION) ×2 IMPLANT
PACK TOTAL JOINT (CUSTOM PROCEDURE TRAY) ×2 IMPLANT
PAD ARMBOARD 7.5X6 YLW CONV (MISCELLANEOUS) ×4 IMPLANT
PAD CAST 4YDX4 CTTN HI CHSV (CAST SUPPLIES) ×1 IMPLANT
PADDING CAST ABS 6INX4YD NS (CAST SUPPLIES) ×1
PADDING CAST ABS COTTON 6X4 NS (CAST SUPPLIES) IMPLANT
PADDING CAST COTTON 4X4 STRL (CAST SUPPLIES) ×2
PADDING CAST COTTON 6X4 STRL (CAST SUPPLIES) ×2 IMPLANT
SET HNDPC FAN SPRY TIP SCT (DISPOSABLE) ×1 IMPLANT
STAPLER VISISTAT 35W (STAPLE) ×2 IMPLANT
SUCTION FRAZIER HANDLE 10FR (MISCELLANEOUS) ×1
SUCTION TUBE FRAZIER 10FR DISP (MISCELLANEOUS) ×1 IMPLANT
SURGIFLO W/THROMBIN 8M KIT (HEMOSTASIS) IMPLANT
SUT BONE WAX W31G (SUTURE) ×2 IMPLANT
SUT ETHIBOND NAB CT1 #1 30IN (SUTURE) ×5 IMPLANT
SUT MNCRL AB 3-0 PS2 18 (SUTURE) ×2 IMPLANT
SUT VIC AB 0 CT1 27 (SUTURE) ×2
SUT VIC AB 0 CT1 27XBRD ANBCTR (SUTURE) ×1 IMPLANT
SYR CONTROL 10ML LL (SYRINGE) IMPLANT
TOWEL OR 17X24 6PK STRL BLUE (TOWEL DISPOSABLE) ×2 IMPLANT
TOWEL OR 17X26 10 PK STRL BLUE (TOWEL DISPOSABLE) ×2 IMPLANT
TRAY FOLEY BAG SILVER LF 16FR (SET/KITS/TRAYS/PACK) ×2 IMPLANT
WATER STERILE IRR 1000ML POUR (IV SOLUTION) ×2 IMPLANT
WRAP KNEE MAXI GEL POST OP (GAUZE/BANDAGES/DRESSINGS) ×2 IMPLANT

## 2016-03-18 NOTE — Transfer of Care (Signed)
Immediate Anesthesia Transfer of Care Note  Patient: Katherine Stanley  Procedure(s) Performed: Procedure(s): TOTAL KNEE ARTHROPLASTY (Right)  Patient Location: PACU  Anesthesia Type:Spinal  Level of Consciousness: awake, alert  and oriented  Airway & Oxygen Therapy: Patient Spontanous Breathing and Patient connected to nasal cannula oxygen  Post-op Assessment: Report given to RN, Post -op Vital signs reviewed and stable and Patient moving all extremities X 4  Post vital signs: Reviewed and stable  Last Vitals:  Vitals:   03/18/16 0619  BP: (!) 171/87  Pulse: 87  Resp: 18  Temp: 37.1 C    Last Pain:  Vitals:   03/18/16 0619  TempSrc: Oral  PainSc:       Patients Stated Pain Goal: 4 ([id number])  Complications: No apparent anesthesia complications

## 2016-03-18 NOTE — Progress Notes (Signed)
Orthopedic Tech Progress Note Patient Details:  Katherine Stanley December 23, 1945 [id number]  CPM Right Knee CPM Right Knee: On Right Knee Flexion (Degrees): 90 Right Knee Extension (Degrees): 0 Additional Comments: trapeze bar patient helper   Hildred Priest 03/18/2016, 10:12 AM Viewed order from doctor's order list

## 2016-03-18 NOTE — Anesthesia Postprocedure Evaluation (Signed)
Anesthesia Post Note  Patient: Katherine Stanley  Procedure(s) Performed: Procedure(s) (LRB): TOTAL KNEE ARTHROPLASTY (Right)  Patient location during evaluation: PACU Anesthesia Type: Spinal and Regional Level of consciousness: sedated, oriented and patient cooperative Pain management: pain level controlled Vital Signs Assessment: post-procedure vital signs reviewed and stable Respiratory status: spontaneous breathing, nonlabored ventilation, respiratory function stable and patient connected to nasal cannula oxygen Cardiovascular status: blood pressure returned to baseline and stable Postop Assessment: no signs of nausea or vomiting, patient able to bend at knees and spinal receding Anesthetic complications: no    Last Vitals:  Vitals:   03/18/16 1130 03/18/16 1300  BP: (!) 156/94 (!) 160/94  Pulse:    Resp:  16  Temp:  36.6 C    Last Pain:  Vitals:   03/18/16 1300  TempSrc: Oral  PainSc:                  Alauna Hayden,E. Kamali Sakata

## 2016-03-18 NOTE — Op Note (Signed)
PATIENT ID:      Katherine Stanley  MRN:     [id number] DOB/AGE:    16-May-1945 / 70 y.o.       OPERATIVE REPORT    DATE OF PROCEDURE:  03/18/2016       PREOPERATIVE DIAGNOSIS:END STAGE   RIGHT KNEE OSTEOARTHRITIS                                                       Estimated body mass index is 32.05 kg/m as calculated from the following:   Height as of 03/13/16: 5' 1.25" (1.556 m).   Weight as of this encounter: 171 lb (77.6 kg).     POSTOPERATIVE DIAGNOSIS:END STAGE   RIGHT KNEE OSTEOARTHRITIS                                                                     Estimated body mass index is 32.05 kg/m as calculated from the following:   Height as of 03/13/16: 5' 1.25" (1.556 m).   Weight as of this encounter: 171 lb (77.6 kg).     PROCEDURE:  Procedure(s):RIGHT TOTAL KNEE ARTHROPLASTY     SURGEON:  [name], MD    ASSISTANT:   Biagio Borg, PA-C   (Present and scrubbed throughout the case, critical for assistance with exposure, retraction, instrumentation, and closure.)          ANESTHESIA: regional, spinal and IV sedation     DRAINS: (RIGHT KNEE) Hemovact drain(s) in the CLAMPED with  Suction Clamped :      TOURNIQUET TIME:  Total Tourniquet Time Documented: Thigh (Right) - 64 minutes Total: Thigh (Right) - 64 minutes     COMPLICATIONS:  None   CONDITION:  stable  PROCEDURE IN DETAIL: [place] 03/18/2016, 9:04 AM

## 2016-03-18 NOTE — Evaluation (Signed)
Physical Therapy Evaluation Patient Details Name: Katherine Stanley MRN: [id number] DOB: 28-Dec-1945 Today's Date: 03/18/2016   History of Present Illness  Patient is a 70 y/o female with hx of HTN presents s/p Rt TKA.  Clinical Impression  Patient presents with nausea and post surgical deficits s/p above surgery. Tolerated transfers and taking a few steps to chair but mobility mainly limited due to nausea and emesis. Education re: knee precautions, PWB status and positioning. Instructed pt in exercises. Pt has support from spouse at home. Will follow acutely to maximize independence and mobility prior to return home.     Follow Up Recommendations Home health PT;Supervision for mobility/OOB;Supervision/Assistance - 24 hour    Equipment Recommendations  Other (comment) (may need RW (trying to borrow one))    Recommendations for Other Services       Precautions / Restrictions Precautions Precautions: Knee Precaution Booklet Issued: No Precaution Comments: Reviewed no pillow under knee and precautions Restrictions Weight Bearing Restrictions: Yes RLE Weight Bearing: Partial weight bearing RLE Partial Weight Bearing Percentage or Pounds: 50%      Mobility  Bed Mobility Overal bed mobility: Needs Assistance Bed Mobility: Supine to Sit     Supine to sit: Supervision;HOB elevated     General bed mobility comments: No assist needed. Upon raising trunk to get to EOB, pt with + emesis.  Transfers Overall transfer level: Needs assistance Equipment used: Rolling walker (2 wheeled) Transfers: Sit to/from Stand Sit to Stand: Min guard         General transfer comment: Min guard for safety. Stood from [employer] with cues for hand placement/technique. Transferred to chair post ambulation.  Ambulation/Gait Ambulation/Gait assistance: Min guard Ambulation Distance (Feet): 6 Feet Assistive device: Rolling walker (2 wheeled) Gait Pattern/deviations: Step-to pattern;Decreased stance  time - right;Decreased step length - left;Decreased stride length Gait velocity: decreased   General Gait Details: Slow, unsteady gait with cues for PWB 50% RLE. + nausea.   Stairs            Wheelchair Mobility    Modified Rankin (Stroke Patients Only)       Balance Overall balance assessment: Needs assistance Sitting-balance support: Feet supported;No upper extremity supported Sitting balance-Leahy Scale: Good     Standing balance support: During functional activity Standing balance-Leahy Scale: Poor Standing balance comment: Reliant on BUEs for support and to maintain PWB RLE.                             Pertinent Vitals/Pain Pain Assessment: No/denies pain    Home Living Family/patient expects to be discharged to:: Private residence Living Arrangements: Spouse/significant other Available Help at Discharge: Family;Available 24 hours/day Type of Home: House Home Access: Level entry     Home Layout: Multi-level Home Equipment: Walker - 4 wheels;Cane - single point      Prior Function Level of Independence: Independent         Comments: Drives, cooks, cleans, golfs.     Hand Dominance   Dominant Hand: Right    Extremity/Trunk Assessment   Upper Extremity Assessment: Defer to OT evaluation           Lower Extremity Assessment: RLE deficits/detail RLE Deficits / Details: Able to perform SLR with knee extension lag.       Communication   Communication: No difficulties  Cognition Arousal/Alertness: Awake/alert Behavior During Therapy: WFL for tasks assessed/performed Overall Cognitive Status: Within Functional Limits for tasks assessed  General Comments General comments (skin integrity, edema, etc.): Spouse present during session.    Exercises Total Joint Exercises Ankle Circles/Pumps: Both;10 reps;Supine Quad Sets: Both;10 reps;Supine Gluteal Sets: Both;10 reps;Supine   Assessment/Plan    PT  Assessment Patient needs continued PT services  PT Problem List Decreased strength;Decreased mobility;Decreased balance;Decreased activity tolerance;Pain;Decreased range of motion          PT Treatment Interventions DME instruction;Therapeutic activities;Gait training;Therapeutic exercise;Patient/family education;Balance training;Functional mobility training    PT Goals (Current goals can be found in the Care Plan section)  Acute Rehab PT Goals Patient Stated Goal: to get back to playing golf PT Goal Formulation: With patient/family Time For Goal Achievement: 04/01/16 Potential to Achieve Goals: Good    Frequency 7X/week   Barriers to discharge        Co-evaluation               End of Session Equipment Utilized During Treatment: Gait belt Activity Tolerance: Patient tolerated treatment well Patient left: in chair;with call bell/phone within reach;with family/visitor present Nurse Communication: Mobility status         Time: [id number] PT Time Calculation (min) (ACUTE ONLY): 30 min   Charges:   PT Evaluation $PT Eval Low Complexity: 1 Procedure PT Treatments $Therapeutic Activity: 8-22 mins   PT G Codes:        Brennley Curtice A Eliya Geiman 03/18/2016, 3:45 PM Wray Kearns, [place], DPT [phone]

## 2016-03-18 NOTE — Anesthesia Procedure Notes (Signed)
Spinal  Patient location during procedure: OR End time: 03/18/2016 7:21 AM Staffing Anesthesiologist: Annye Asa Performed: anesthesiologist  Preanesthetic Checklist Completed: patient identified, site marked, surgical consent, pre-op evaluation, timeout performed, IV checked, risks and benefits discussed and monitors and equipment checked Spinal Block Patient position: sitting Prep: ChloraPrep and site prepped and draped Patient monitoring: blood pressure, continuous pulse ox, cardiac monitor and heart rate Approach: midline Location: L3-4 Injection technique: single-shot Needle Needle type: Quincke  Needle gauge: 25 G Needle length: 9 cm Additional Notes Pt identified in Operating room.  Monitors applied. Working IV access confirmed. Sterile prep, drape lumbar spine.  1% lido local L 3,4.  #25ga Quincke into clear CSF L 3,4.  12mg  0.75% Bupivacaine with dextrose injected with asp CSF beginning and end of injection.  Patient asymptomatic, VSS, no heme aspirated, tolerated well.  Jenita Seashore, MD

## 2016-03-18 NOTE — Anesthesia Procedure Notes (Signed)
Procedure Name: MAC Date/Time: 03/18/2016 7:15 AM Performed by: Neldon Newport Pre-anesthesia Checklist: Timeout performed, Patient being monitored, Suction available, Emergency Drugs available and Patient identified Patient Re-evaluated:Patient Re-evaluated prior to inductionOxygen Delivery Method: Simple face mask Placement Confirmation: positive ETCO2

## 2016-03-18 NOTE — Anesthesia Procedure Notes (Signed)
Anesthesia Regional Block:  Adductor canal block  Pre-Anesthetic Checklist: ,, timeout performed, Correct Patient, Correct Site, Correct Laterality, Correct Procedure, Correct Position, site marked, Risks and benefits discussed,  Surgical consent,  Pre-op evaluation,  At surgeon's request and post-op pain management  Laterality: Right and Lower  Prep: chloraprep       Needles:  Injection technique: Single-shot  Needle Type: Echogenic Needle     Needle Length: 9cm 9 cm Needle Gauge: 21 and 21 G    Additional Needles:  Procedures: ultrasound guided (picture in chart) Adductor canal block Narrative:  Start time: 03/18/2016 7:02 AM End time: 03/18/2016 7:08 AM Injection made incrementally with aspirations every 5 mL.  Performed by: Personally  Anesthesiologist: Glennon Mac, Katrina Brosh  Additional Notes: Pt identified in Holding room.  Monitors applied. Working IV access confirmed. Sterile prep, drape R thigh.  #22ga ECHOgenic needle into adductor canal with US guidance.  20cc 0.75% Bupivacaine injected incrementally after negative test dose.  Patient asymptomatic, VSS, no heme aspirated, tolerated well.  Jenita Seashore, MD

## 2016-03-18 NOTE — H&P (Signed)
The recent History & Physical has been reviewed. I have personally examined the patient today. There is no interval change to the documented History & Physical. The patient would like to proceed with the procedure.  Garald Balding 03/18/2016,  7:06 AM

## 2016-03-19 ENCOUNTER — Encounter (HOSPITAL_COMMUNITY): Payer: Self-pay | Admitting: Orthopaedic Surgery

## 2016-03-19 LAB — BASIC METABOLIC PANEL
ANION GAP: 9 (ref 5–15)
BUN: 6 mg/dL (ref 6–20)
CHLORIDE: 104 mmol/L (ref 101–111)
CO2: 25 mmol/L (ref 22–32)
Calcium: 8.7 mg/dL — ABNORMAL LOW (ref 8.9–10.3)
Creatinine, Ser: 0.61 mg/dL (ref 0.44–1.00)
GFR calc non Af Amer: >60 mL/min (ref >60)
Glucose, Bld: 114 mg/dL — ABNORMAL HIGH (ref 65–99)
POTASSIUM: 4 mmol/L (ref 3.5–5.1)
SODIUM: 138 mmol/L (ref 135–145)

## 2016-03-19 LAB — CBC
HEMATOCRIT: 33.1 % — AB (ref 36.0–46.0)
HEMOGLOBIN: 11.1 g/dL — AB (ref 12.0–15.0)
MCH: 30 pg (ref 26.0–34.0)
MCHC: 33.5 g/dL (ref 30.0–36.0)
MCV: 89.5 fL (ref 78.0–100.0)
Platelets: 215 10*3/uL (ref 150–400)
RBC: 3.7 MIL/uL — ABNORMAL LOW (ref 3.87–5.11)
RDW: 12.8 % (ref 11.5–15.5)
WBC: 7 10*3/uL (ref 4.0–10.5)

## 2016-03-19 NOTE — Evaluation (Signed)
Occupational Therapy Evaluation Patient Details Name: Katherine Stanley MRN: [id number] DOB: February 26, 1946 Today's Date: 03/19/2016    History of Present Illness Patient is a 70 y/o female with hx of HTN presents s/p Rt TKA.   Clinical Impression   PTA, pt was independent with ADL and IADL. Currently pt requires min guard assist for LB ADL and supervision for functional mobility. Began pt education on ADL techniques post-operatively, fall prevention, energy conservation, use of ice for pain management, and safe toilet transfers. Pt lives with husband who will be able to provide 24 hour assistance as needed. Pt able to verbalize and demonstrate good adherence to [facility] precautions and no pillow under knee. No OT follow-up recommended post-acute D/C. Pt would benefit from continued OT services while admitted to improve independence with ADL and functional mobility prior to D/C.  Recommend 3-in-1 BSC for DME needs. OT will continue to follow acutely.    Follow Up Recommendations  No OT follow up;Supervision/Assistance - 24 hour    Equipment Recommendations  3 in 1 bedside comode    Recommendations for Other Services       Precautions / Restrictions Precautions Precautions: Knee Precaution Booklet Issued: No Precaution Comments: Reviewed no pillow under knee and precautions Restrictions Weight Bearing Restrictions: Yes RLE Weight Bearing: Partial weight bearing RLE Partial Weight Bearing Percentage or Pounds: 50      Mobility Bed Mobility Overal bed mobility: Modified Independent Bed Mobility: Supine to Sit;Sit to Supine           General bed mobility comments: increased time and effort  Transfers Overall transfer level: Needs assistance Equipment used: Rolling walker (2 wheeled) Transfers: Sit to/from Stand Sit to Stand: Supervision         General transfer comment: supervision for safety from EOB and BSC    Balance   Sitting-balance support: No upper extremity  supported;Feet supported Sitting balance-Leahy Scale: Good     Standing balance support: Bilateral upper extremity supported;No upper extremity supported;During functional activity Standing balance-Leahy Scale: Fair Standing balance comment: Able to stand statically with no UE support but reliant on B UE support for dynamic tasks.                            ADL Overall ADL's : Needs assistance/impaired     Grooming: Set up;Sitting   Upper Body Bathing: Set up;Sitting   Lower Body Bathing: Min guard;Sit to/from stand   Upper Body Dressing : Set up;Sitting   Lower Body Dressing: Min guard;Sit to/from stand   Toilet Transfer: Supervision/safety;Ambulation;BSC;RW   Toileting- Clothing Manipulation and Hygiene: Sit to/from stand;Supervision/safety       Functional mobility during ADLs: Supervision/safety;Rolling walker General ADL Comments: Educated pt on knee precautions during ADL, no pillow under knee, safe use of DME, and ADL techniques.     Vision Vision Assessment?: No apparent visual deficits   Perception     Praxis      Pertinent Vitals/Pain Pain Assessment: 0-10 Pain Score: 5  Faces Pain Scale: Hurts little more Pain Location: R knee Pain Descriptors / Indicators: Sore Pain Intervention(s): Limited activity within patient's tolerance;Monitored during session;Repositioned     Hand Dominance Right   Extremity/Trunk Assessment Upper Extremity Assessment Upper Extremity Assessment: Overall WFL for tasks assessed   Lower Extremity Assessment Lower Extremity Assessment: RLE deficits/detail RLE Deficits / Details: Slightly decreased strength and ROM as expected post-operatively.       Communication Communication Communication: No difficulties  Cognition Arousal/Alertness: Awake/alert Behavior During Therapy: WFL for tasks assessed/performed Overall Cognitive Status: Within Functional Limits for tasks assessed                      General Comments       Exercises       Shoulder Instructions      Home Living Family/patient expects to be discharged to:: Private residence Living Arrangements: Spouse/significant other Available Help at Discharge: Family;Available 24 hours/day Type of Home: House Home Access: Level entry     Home Layout: Multi-level Alternate Level Stairs-Number of Steps: Able to stay in "man cave" main level   Bathroom Shower/Tub: Occupational psychologist: Standard Bathroom Accessibility: Yes How Accessible: Accessible via walker Home Equipment: [place] - 2 wheels;Cane - single point;Shower seat          Prior Functioning/Environment Level of Independence: Independent        Comments: Drives, cooks, cleans, golfs.        OT Problem List: Decreased strength;Decreased range of motion;Decreased activity tolerance;Impaired balance (sitting and/or standing);Decreased safety awareness;Decreased knowledge of use of DME or AE;Decreased knowledge of precautions;Pain   OT Treatment/Interventions: Self-care/ADL training;Therapeutic exercise;Energy conservation;Therapeutic activities;Patient/family education;Balance training    OT Goals(Current goals can be found in the care plan section) Acute Rehab OT Goals Patient Stated Goal: to get back to playing golf OT Goal Formulation: With patient Time For Goal Achievement: 04/02/16 Potential to Achieve Goals: Good ADL Goals Pt Will Perform Lower Body Bathing: with modified independence;sit to/from stand Pt Will Perform Lower Body Dressing: with modified independence;sit to/from stand Pt Will Transfer to Toilet: with modified independence;ambulating Pt Will Perform Toileting - Clothing Manipulation and hygiene: with modified independence;sit to/from stand Pt Will Perform Tub/Shower Transfer: with modified independence;Shower transfer;shower seat;ambulating;rolling walker  OT Frequency: Min 2X/week   Barriers to D/C:             Co-evaluation              End of Session Equipment Utilized During Treatment: Gait belt;Rolling walker  Activity Tolerance: Patient tolerated treatment well Patient left: in chair;with call bell/phone within reach   Time: 1445-1501 OT Time Calculation (min): 16 min Charges:  OT General Charges $OT Visit: 1 Procedure OT Evaluation $OT Eval Moderate Complexity: 1 Procedure  Norman Herrlich, OTR/L [phone] 03/19/2016, 3:41 PM

## 2016-03-19 NOTE — H&P (Signed)
PATIENT ID: Katherine Stanley        MRN:  [id number]          DOB/AGE: Jul 16, 1945 / 70 y.o.    [name], MD   Biagio Borg, PA-C [address] [place], [place]  [address]                             [phone]   PROGRESS NOTE  Subjective:  negative for Chest Pain  negative for Shortness of Breath  positive for Nausea/Vomiting last night -OK this am  negative for Calf Pain    Tolerating Diet: yes         Patient reports pain as mild.     No complaints this am  Objective: Vital signs in last 24 hours:   Patient Vitals for the past 24 hrs:  BP Temp Temp src Pulse Resp SpO2 Height Weight  03/19/16 0440 122/61 98.6 F (37 C) Oral 93 16 96 % - -  03/18/16 2356 133/86 98.8 F (37.1 C) Oral 97 16 98 % - -  03/18/16 1953 (!) 143/85 98.8 F (37.1 C) Oral 95 16 96 % - -  03/18/16 1300 (!) 160/94 97.8 F (36.6 C) Oral - 16 97 % 5' 1.25" (1.556 m) 171 lb (77.6 kg)  03/18/16 1130 (!) 156/94 - - - - - - -  03/18/16 1030 (!) 142/82 - - - - 96 % - -  03/18/16 1015 135/70 - - - - - - -  03/18/16 1000 132/85 - - - - - - -  03/18/16 0945 123/76 - - - - - - -  03/18/16 0934 131/77 97.5 F (36.4 C) - 94 16 96 % - -      Intake/Output from previous day:   12/05 0701 - 12/06 0700 In: 2513.2 [P.O.:60; I.V.:2253.2] Out: 2660 [Urine:2235; Drains:350]   Intake/Output this shift:   No intake/output data recorded.   Intake/Output      12/05 0701 - 12/06 0700 12/06 0701 - 12/07 0700   P.O. 60    I.V. (mL/kg) 2253.2 (29)    IV Piggyback 200    Total Intake(mL/kg) 2513.2 (32.4)    Urine (mL/kg/hr) 2235 (1.2)    Drains 350 (0.2)    Stool 0 (0)    Blood 75 (0)    Total Output 2660     Net -146.8          Urine Occurrence  1 x   Stool Occurrence 1 x       LABORATORY DATA:  Recent Labs  03/18/16 0709 03/19/16 0455  WBC 6.5 7.0  HGB 14.0 11.1*  HCT 40.5 33.1*  PLT 271 215    Recent Labs  03/18/16 0709 03/19/16 0455  NA 141 138  K 3.5 4.0  CL 112* 104  CO2  18* 25  BUN 10 6  CREATININE 0.58 0.61  GLUCOSE 112* 114*  CALCIUM 9.4 8.7*   Lab Results  Component Value Date   INR 1.04 03/12/2016    Recent Radiographic Studies :  Dg Chest 2 View  Result Date: 03/12/2016 CLINICAL DATA:  Pre op for a right knee replacement No recent chest complaintsHx of HTN- on meds, non smoker EXAM: CHEST  2 VIEW COMPARISON:  None. FINDINGS: The heart size and mediastinal contours are within normal limits. Both lungs are clear. No pleural effusion or pneumothorax. The visualized skeletal structures are unremarkable. IMPRESSION: No active cardiopulmonary disease.  Electronically Signed   By: Lajean Manes M.D.   On: 03/12/2016 10:13     Examination:  General appearance: alert, cooperative and no distress  Wound Exam: clean, dry, intact   Drainage:  Scant/small amount Serosanguinous exudate in hemovac  Motor Exam: EHL, FHL, Anterior Tibial and Posterior Tibial Intact  Sensory Exam: Superficial Peroneal, Deep Peroneal and Tibial normal  Vascular Exam: Normal  Assessment:    1 Day Post-Op  Procedure(s) (LRB): TOTAL KNEE ARTHROPLASTY (Right)  ADDITIONAL DIAGNOSIS:  Principal Problem:   Unilateral primary osteoarthritis, right knee Active Problems:   S/P total knee replacement using cement, right  no new problems   Plan: Physical Therapy as ordered Partial Weight Bearing @ 50% (PWB)  DVT Prophylaxis:  Xarelto, Foot Pumps and TED hose  DISCHARGE PLAN: Home  DISCHARGE NEEDS: HHPT, CPM, Walker and 3-in-1 comode seat   foley out and voiding without difficulty, D/C hemovac, OOB with PT, saline lock IV    Garald Balding  03/19/2016 7:46 AM

## 2016-03-19 NOTE — Op Note (Signed)
NAME:  Katherine Stanley, Katherine Stanley NO.:  [id number]  MEDICAL RECORD NO.:  [id number]  LOCATION:                                 FACILITY:  PHYSICIAN:  Anderson Malta, MD      DATE OF BIRTH:  19-Jan-1946  DATE OF PROCEDURE:  03/18/2016 DATE OF DISCHARGE:                              OPERATIVE REPORT   PREOPERATIVE DIAGNOSIS:  End-stage osteoarthritis, right knee.  POSTOPERATIVE DIAGNOSIS:  End-stage osteoarthritis, right knee.  PROCEDURE:  Right total knee replacement.  SURGEON:  Vonna Kotyk. Durward Fortes, M.D.  ASSISTANT:  Aaron Edelman D. Petrarca, PA-C.  ANESTHESIA:  Spinal with IV sedation and adductor canal block.  COMPLICATIONS:  None.  COMPONENTS:  DePuy LCS medium femoral condyle, a #2 rotating keeled tibial tray with a 10-mm polyethylene bridging bearing and metal-backed 3-peg rotating patella.  Components were secured with polymethyl methacrylate.  DESCRIPTION OF PROCEDURE:  Katherine Stanley was met with her husband in the holding area, identified the right knee as the appropriate operative site and marked it accordingly.  The patient had a preoperative adductor canal block per Anesthesia.  The patient was then transported to room #7 and administered a spinal anesthetic per Anesthesia.  The patient was then placed comfortably in operating table.  A Foley was inserted.  The urine was clear.  The right lower extremity was then placed in a thigh tourniquet.  The right lower extremity was prepped with chlorhexidine scrub and DuraPrep x2 from the tourniquet to the tips of the toes.  Sterile draping was performed.  Time-out was called.  The right lower extremity was then elevated, Esmarch exsanguinated with a proximal tourniquet at 325 mmHg.  A midline longitudinal incision was made centered about the patella, extending from the superior pouch to the tibial tubercle.  Via a sharp dissection, incision was carried down to subcutaneous tissue.  First layer of capsule was  incised in the midline.  A medial parapatellar incision was then made with the Bovie.  The joint was entered.  There was minimal clear yellow joint effusion.  The patella was everted to 180 degrees laterally, the knee flexed to 90 degrees.  There was a moderate amount of synovitis that was resected.  There were moderately large osteophytes along the medial and lateral femoral condyle.  These were removed.  I measured a medium femoral component.  First, bony cut was then made transversely in the proximal tibia with the external tibial guide within 7-degree angle of declination.  With each bony cut on the tibia and the femur, I checked my alignment with the external jig.  Subsequent cuts were then made on the femur using the medium femoral jig.  I used a 4-degree distal femoral valgus cut.  Flexion and extension gaps were perfectly symmetrical at 10 mm.  Laminar spreaders were then placed along the medial and lateral compartments.  I removed medial and lateral menisci as well as ACL and PCL.  Osteophytes were removed from the posterior femoral condyle with a curved 3/4 inch osteotome.  MCL and LCL remained intact.  Final cut was then made on the femur for tapering and to obtain the center holes.  I used a 4-degree  distal femoral valgus cut.  Retractors were then placed about the tibia, was advanced anteriorly.  I measured a #2 tibial tray, this was pinned in place.  Center hole was made followed by the keeled cut.  With the tibial tray in place, the 10- mm polyethylene bridging bearing was inserted, followed by the medium femoral component.  The entire construct was reduced and through a full range of motion, we had no malrotation of the tibial tray.  There was no opening with varus or valgus stress.  The patella was then prepared by removing 10 mm of bone leaving 13 mm of patellar thickness.  Three holes were then made and the trial patella inserted and through a full range of  motion remained stable.  The trial components removed.  Joint was copiously irrigated with saline solution.  Final components were then impacted with polymethyl methacrylate.  We initially inserted the #2 tibial tray, followed by the 10-mm polyethylene bridging bearing in this medium femoral condyle. Extraneous methacrylate was removed from the periphery of the components.  The patella was applied with methacrylate as well.  At approximately 16 minutes, the methacrylate had matured, during which time we injected the joint with 0.25% Marcaine with epinephrine.  Tourniquet was deflated at 64 minutes.  Gross bleeders were Bovie coagulated.  We used topical tranexamic acid.  Though we had very nice hemostasis, a medium-size Hemovac was placed through the lateral compartment.  The deep capsule was then closed with a running #1 Ethibond, superficial capsule with 0 Vicryl, subcu with 3-0 Monocryl, and skin closed with skin clips.  Sterile bulky dressing was applied, followed by the patient's support stocking.  The patient tolerated the procedure without complications.     Vonna Kotyk. Durward Fortes, M.D.   ______________________________ Darnell Level. Alphonzo Severance, MD    PWW/MEDQ  D:  03/18/2016  T:  03/19/2016  Job:  [id number]

## 2016-03-19 NOTE — Progress Notes (Signed)
Physical Therapy Treatment Patient Details Name: Katherine Stanley MRN: [id number] DOB: June 08, 1945 Today's Date: 03/19/2016    History of Present Illness Patient is a 70 y/o female with hx of HTN presents s/p Rt TKA.    PT Comments    Patient continues to do well and progress with mobility. Current plan remains appropriate.   Follow Up Recommendations  Home health PT;Supervision for mobility/OOB;Supervision/Assistance - 24 hour     Equipment Recommendations  Rolling walker with 5" wheels    Recommendations for Other Services       Precautions / Restrictions Precautions Precautions: Knee Precaution Booklet Issued: No Precaution Comments: Reviewed no pillow under knee and precautions Restrictions Weight Bearing Restrictions: Yes RLE Weight Bearing: Partial weight bearing RLE Partial Weight Bearing Percentage or Pounds: 50    Mobility  Bed Mobility Overal bed mobility: Modified Independent Bed Mobility: Supine to Sit;Sit to Supine           General bed mobility comments: increased time and effort  Transfers Overall transfer level: Needs assistance Equipment used: Rolling walker (2 wheeled) Transfers: Sit to/from Stand Sit to Stand: Supervision         General transfer comment: supervision for safety from EOB and BSC  Ambulation/Gait Ambulation/Gait assistance: Min guard Ambulation Distance (Feet): 200 Feet Assistive device: Rolling walker (2 wheeled) Gait Pattern/deviations: Step-through pattern;Decreased stance time - right;Decreased step length - left;Decreased weight shift to right Gait velocity: decreased   General Gait Details: slow, steady gait   Stairs Stairs: Yes Stairs assistance: Min guard Stair Management: No rails;Forwards;With walker Number of Stairs: 1 General stair comments: cues for technique and WB status  Wheelchair Mobility    Modified Rankin (Stroke Patients Only)       Balance     Sitting balance-Leahy Scale: Good       Standing balance-Leahy Scale: Fair                      Cognition Arousal/Alertness: Awake/alert Behavior During Therapy: WFL for tasks assessed/performed Overall Cognitive Status: Within Functional Limits for tasks assessed                      Exercises Total Joint Exercises Quad Sets: AROM;Right;10 reps Heel Slides: AROM;Right;10 reps Hip ABduction/ADduction: AROM;Right;10 reps Straight Leg Raises: AROM;Right;10 reps Long Arc Quad: AROM;Right;10 reps Goniometric ROM: 80 degrees flexion    General Comments        Pertinent Vitals/Pain Pain Assessment: Faces Faces Pain Scale: Hurts little more Pain Location: R knee Pain Descriptors / Indicators: Sore Pain Intervention(s): Limited activity within patient's tolerance;Monitored during session;Premedicated before session;Repositioned    Home Living                      Prior Function            PT Goals (current goals can now be found in the care plan section) Acute Rehab PT Goals Patient Stated Goal: to get back to playing golf Progress towards PT goals: Progressing toward goals    Frequency    7X/week      PT Plan Current plan remains appropriate    Co-evaluation             End of Session Equipment Utilized During Treatment: Gait belt Activity Tolerance: Patient tolerated treatment well Patient left: with call bell/phone within reach;in bed;in CPM     Time: [id number] PT Time Calculation (min) (ACUTE ONLY): 24 min  Charges:  Gait training 8-22 min therapeutic activities 8-22 min                    G Codes:      Salina April, PTA Pager: [phone]   03/19/2016, 2:46 PM

## 2016-03-19 NOTE — Progress Notes (Signed)
Physical Therapy Treatment Patient Details Name: Katherine Stanley MRN: [id number] DOB: 04-11-1946 Today's Date: 03/19/2016    History of Present Illness Patient is a 70 y/o female with hx of HTN presents s/p Rt TKA.    PT Comments    Patient is progressing very well toward mobility goals. Continue to progress as tolerated with anticipated d/c home with HHPT.   Follow Up Recommendations  Home health PT;Supervision for mobility/OOB;Supervision/Assistance - 24 hour     Equipment Recommendations  Rolling walker with 5" wheels    Recommendations for Other Services       Precautions / Restrictions Precautions Precautions: Knee Precaution Booklet Issued: No Precaution Comments: Reviewed no pillow under knee and precautions Restrictions Weight Bearing Restrictions: Yes RLE Weight Bearing: Partial weight bearing RLE Partial Weight Bearing Percentage or Pounds: 50    Mobility  Bed Mobility               General bed mobility comments: pt OOB in chair upon arrival  Transfers Overall transfer level: Needs assistance Equipment used: Rolling walker (2 wheeled) Transfers: Sit to/from Stand Sit to Stand: Supervision         General transfer comment: supervision for safety  Ambulation/Gait Ambulation/Gait assistance: Min guard Ambulation Distance (Feet): 150 Feet Assistive device: Rolling walker (2 wheeled) Gait Pattern/deviations: Step-through pattern;Decreased stance time - right;Decreased step length - left;Decreased weight shift to right Gait velocity: decreased   General Gait Details: steady gait with cues for sequencing and proximity of RW   Stairs Stairs: Yes Stairs assistance: Min guard Stair Management: No rails;Forwards;With walker Number of Stairs: 1 General stair comments: cues for technique and WB status  Wheelchair Mobility    Modified Rankin (Stroke Patients Only)       Balance     Sitting balance-Leahy Scale: Good       Standing  balance-Leahy Scale: Fair                      Cognition Arousal/Alertness: Awake/alert Behavior During Therapy: WFL for tasks assessed/performed Overall Cognitive Status: Within Functional Limits for tasks assessed                      Exercises Total Joint Exercises Quad Sets: AROM;Right;10 reps Heel Slides: AROM;Right;10 reps Hip ABduction/ADduction: AROM;Right;10 reps Straight Leg Raises: AROM;Right;10 reps Long Arc Quad: AROM;Right;10 reps Goniometric ROM: 80 degrees flexion    General Comments        Pertinent Vitals/Pain Pain Assessment: Faces Faces Pain Scale: Hurts little more Pain Location: R knee Pain Descriptors / Indicators: Aching;Sore Pain Intervention(s): Limited activity within patient's tolerance;Monitored during session;Premedicated before session;Repositioned    Home Living                      Prior Function            PT Goals (current goals can now be found in the care plan section) Acute Rehab PT Goals Patient Stated Goal: to get back to playing golf Progress towards PT goals: Progressing toward goals    Frequency    7X/week      PT Plan Current plan remains appropriate    Co-evaluation             End of Session Equipment Utilized During Treatment: Gait belt Activity Tolerance: Patient tolerated treatment well Patient left: in chair;with call bell/phone within reach     Time: 1100-1130 PT Time Calculation (min) (ACUTE ONLY): 30  min  Charges:  $Gait Training: 8-22 mins $Therapeutic Exercise: 8-22 mins                    G Codes:      Salina April, PTA Pager: [phone]   03/19/2016, 11:45 AM

## 2016-03-20 LAB — BASIC METABOLIC PANEL
Anion gap: 6 (ref 5–15)
BUN: 5 mg/dL — ABNORMAL LOW (ref 6–20)
CO2: 25 mmol/L (ref 22–32)
Calcium: 8.5 mg/dL — ABNORMAL LOW (ref 8.9–10.3)
Chloride: 107 mmol/L (ref 101–111)
Creatinine, Ser: 0.58 mg/dL (ref 0.44–1.00)
GFR calc Af Amer: >60 mL/min (ref >60)
GFR calc non Af Amer: >60 mL/min (ref >60)
Glucose, Bld: 127 mg/dL — ABNORMAL HIGH (ref 65–99)
Potassium: 3.4 mmol/L — ABNORMAL LOW (ref 3.5–5.1)
Sodium: 138 mmol/L (ref 135–145)

## 2016-03-20 LAB — CBC
HCT: 33.1 % — ABNORMAL LOW (ref 36.0–46.0)
Hemoglobin: 11 g/dL — ABNORMAL LOW (ref 12.0–15.0)
MCH: 29.9 pg (ref 26.0–34.0)
MCHC: 33.2 g/dL (ref 30.0–36.0)
MCV: 89.9 fL (ref 78.0–100.0)
Platelets: 206 10*3/uL (ref 150–400)
RBC: 3.68 MIL/uL — ABNORMAL LOW (ref 3.87–5.11)
RDW: 12.8 % (ref 11.5–15.5)
WBC: 9.3 10*3/uL (ref 4.0–10.5)

## 2016-03-20 MED ORDER — RIVAROXABAN 10 MG PO TABS: 10.0000 mg | ORAL_TABLET | Freq: Every day | ORAL | 0 refills | Status: DC

## 2016-03-20 MED ORDER — METHOCARBAMOL 500 MG PO TABS
500.0000 mg | ORAL_TABLET | Freq: Three times a day (TID) | ORAL | 0 refills | Status: DC | PRN
Start: 2016-03-20 — End: 2016-04-25

## 2016-03-20 MED ORDER — OXYCODONE HCL 5 MG PO TABS: 5.0000 mg | ORAL_TABLET | ORAL | 0 refills | Status: DC | PRN

## 2016-03-20 NOTE — Progress Notes (Signed)
Physical Therapy Treatment Patient Details Name: Katherine Stanley MRN: [id number] DOB: 1946/02/07 Today's Date: 03/20/2016    History of Present Illness Patient is a 70 y/o female with hx of HTN presents s/p Rt TKA.    PT Comments    Patient is making good progress with PT.  From a mobility standpoint anticipate patient will be ready for DC home when medically ready.     Follow Up Recommendations  Home health PT;Supervision for mobility/OOB;Supervision/Assistance - 24 hour     Equipment Recommendations  Rolling walker with 5" wheels    Recommendations for Other Services       Precautions / Restrictions Precautions Precautions: Knee Precaution Booklet Issued: No Precaution Comments: Reviewed no pillow under knee and precautions Restrictions Weight Bearing Restrictions: Yes RLE Weight Bearing: Partial weight bearing RLE Partial Weight Bearing Percentage or Pounds: 50    Mobility  Bed Mobility               General bed mobility comments: pt OOB in chair upon arrival  Transfers Overall transfer level: Needs assistance Equipment used: Rolling walker (2 wheeled) Transfers: Sit to/from Stand Sit to Stand: Supervision         General transfer comment: supervision for safety; good technique  Ambulation/Gait Ambulation/Gait assistance: Supervision Ambulation Distance (Feet): 250 Feet Assistive device: Rolling walker (2 wheeled) Gait Pattern/deviations: Step-through pattern;Decreased stance time - right;Decreased step length - left;Decreased weight shift to right Gait velocity: decreased   General Gait Details: slow, steady gait; pt able to [employer] PWB R LE   Stairs            Wheelchair Mobility    Modified Rankin (Stroke Patients Only)       Balance     Sitting balance-Leahy Scale: Good       Standing balance-Leahy Scale: Fair                      Cognition Arousal/Alertness: Awake/alert Behavior During Therapy: WFL for tasks  assessed/performed Overall Cognitive Status: Within Functional Limits for tasks assessed                      Exercises Total Joint Exercises Quad Sets: AROM;Right;10 reps Heel Slides: AROM;Right;15 reps Hip ABduction/ADduction: AROM;Right;15 reps Straight Leg Raises: AROM;Right;10 reps Long Arc Quad: AROM;Right;15 reps Knee Flexion: AROM;Right;5 reps;Other (comment) (10 sec holds) Goniometric ROM: 95 degrees flexion in sitting    General Comments General comments (skin integrity, edema, etc.): husband present for session      Pertinent Vitals/Pain Pain Assessment: Faces Faces Pain Scale: Hurts little more Pain Location: R knee with therex Pain Descriptors / Indicators: Sore Pain Intervention(s): Limited activity within patient's tolerance;Monitored during session;Premedicated before session;Repositioned;Ice applied    Home Living                      Prior Function            PT Goals (current goals can now be found in the care plan section) Acute Rehab PT Goals Patient Stated Goal: go home Progress towards PT goals: Progressing toward goals    Frequency    7X/week      PT Plan Current plan remains appropriate    Co-evaluation             End of Session Equipment Utilized During Treatment: Gait belt Activity Tolerance: Patient tolerated treatment well Patient left: with call bell/phone within reach;in chair;with family/visitor present  Time: [id number] PT Time Calculation (min) (ACUTE ONLY): 20 min  Charges:  $Gait Training: 8-22 mins                    G Codes:      Salina April, PTA Pager: [phone]   03/20/2016, 9:29 AM

## 2016-03-20 NOTE — Consult Note (Signed)
            [facility] CM Primary Care Navigator  03/20/2016  MARIZZA FUNARI 05/01/1945 [id number]   Went to see patient at the bedside to identify possible discharge needs but staff states that patient had already been discharged today.  Patient was discharged to home with home health services ([facility]).   For additional questions please contact:  Edwena Felty A. Khaleelah Yowell, BSN, RN-BC [facility] PRIMARY CARE Navigator Cell: [phone]

## 2016-03-20 NOTE — Discharge Summary (Signed)
[name], MD   Biagio Borg, PA-C [address] [place], [place]  [address]                             [phone]  PATIENT ID: Katherine Stanley        MRN:  [id number]          DOB/AGE: [birth year] / 70 y.o.    DISCHARGE SUMMARY  ADMISSION DATE:    03/18/2016 DISCHARGE DATE:   03/20/2016   ADMISSION DIAGNOSIS: RIGHT KNEE OSTEOARTHRITIS    DISCHARGE DIAGNOSIS:  RIGHT KNEE OSTEOARTHRITIS    ADDITIONAL DIAGNOSIS: Principal Problem:   Unilateral primary osteoarthritis, right knee Active Problems:   S/P total knee replacement using cement, right  Past Medical History:  Diagnosis Date  . Arthritis   . Diverticulosis   . Headache    occasionally  . Hypertension    takes Lisinopril daily  . Joint pain   . Osteopenia   . Rosacea     PROCEDURE: Procedure(s): TOTAL KNEE ARTHROPLASTY Right on 03/18/2016  CONSULTS: none    HISTORY: Patient is a 70 y.o.femalepresented with a history of pain in the leftknee for 4 years. Onset of symptoms was gradual starting 4monthsagowith rapidly worseningcourse since that time. No prior procedures on the knee.Patient has been treated conservatively with over-the-counter NSAIDs and activity modification. Patient currently rates pain in the knee at 6-10out of 10 with activity. There is pain at night.present.  They have been previously treated with: NSAIDS: Motrin, NSAID with mild improvement Knee injection with corticosteroid wasperformed Knee injection with visco supplementation wasperformed   Medications: Motrin with mild improvement  HOSPITAL COURSE:  Katherine Stanley is a 70 y.o. admitted on 03/18/2016 and found to have a diagnosis of [place].  After appropriate laboratory studies were obtained  they were taken to the operating room on 03/18/2016 and underwent  Procedure(s): TOTAL KNEE ARTHROPLASTY  Right.   They were given perioperative antibiotics:  Anti-infectives    Start     Dose/Rate Route  Frequency Ordered Stop   03/18/16 1330  ceFAZolin (ANCEF) IVPB 2g/100 mL premix     2 g 200 mL/hr over 30 Minutes Intravenous Every 6 hours 03/18/16 1259 03/18/16 2006   03/18/16 0700  ceFAZolin (ANCEF) IVPB 2g/100 mL premix     2 g 200 mL/hr over 30 Minutes Intravenous To ShortStay Surgical 03/17/16 1113 03/18/16 0732    .  Tolerated the procedure well.  Placed with a foley intraoperatively.    Toradol was given post op.  POD #1, allowed out of bed to a chair.  PT for ambulation and exercise program.  Foley D/C'd in morning.  IV saline locked.  O2 discontionued. Hemovac pulled.  POD #2, continued PT and ambulation.   .  The remainder of the hospital course was dedicated to ambulation and strengthening.   The patient was discharged on 2 Days Post-Op in  Stable condition.  Blood products given:none  DIAGNOSTIC STUDIES: Recent vital signs: Patient Vitals for the past 24 hrs:  BP Temp Temp src Pulse Resp SpO2  03/20/16 0503 (!) 164/83 98.2 F (36.8 C) Oral (!) 106 16 97 %  03/19/16 2117 127/65 99.4 F (37.4 C) Oral (!) 111 16 97 %  03/19/16 1525 135/65 99.3 F (37.4 C) Oral 92 17 96 %       Recent laboratory studies:  Recent Labs  03/18/16 0709 03/19/16 0455 03/20/16 0432  WBC  6.5 7.0 9.3  HGB 14.0 11.1* 11.0*  HCT 40.5 33.1* 33.1*  PLT 271 215 206    Recent Labs  03/18/16 0709 03/19/16 0455 03/20/16 0432  NA 141 138 138  K 3.5 4.0 3.4*  CL 112* 104 107  CO2 18* 25 25  BUN 10 6 5*  CREATININE 0.58 0.61 0.58  GLUCOSE 112* 114* 127*  CALCIUM 9.4 8.7* 8.5*   Lab Results  Component Value Date   INR 1.04 03/12/2016     Recent Radiographic Studies :  Dg Chest 2 View  Result Date: 03/12/2016 CLINICAL DATA:  Pre op for a right knee replacement No recent chest complaintsHx of HTN- on meds, non smoker EXAM: CHEST  2 VIEW COMPARISON:  None. FINDINGS: The heart size and mediastinal contours are within normal limits. Both lungs are clear. No pleural effusion or  pneumothorax. The visualized skeletal structures are unremarkable. IMPRESSION: No active cardiopulmonary disease. Electronically Signed   By: Lajean Manes M.D.   On: 03/12/2016 10:13    DISCHARGE INSTRUCTIONS: Discharge Instructions    CPM    Complete by:  As directed    Continuous passive motion machine (CPM):      Use the CPM from 0 to 60 for 6-8 hours per day.      You may increase by 5-10 degrees per day.  You may break it up into 2 or 3 sessions per day.      Use CPM for 3-4  weeks or until you are told to stop.   Call MD / Call 911    Complete by:  As directed    If you experience chest pain or shortness of breath, CALL 911 and be transported to the hospital emergency room.  If you develope a fever above 101 F, pus (white drainage) or increased drainage or redness at the wound, or calf pain, call your surgeon's office.   Change dressing    Complete by:  As directed    DO NOT CHANGE YOUR DRESSING   Constipation Prevention    Complete by:  As directed    Drink plenty of fluids.  Prune juice may be helpful.  You may use a stool softener, such as Colace (over the counter) 100 mg twice a day.  Use MiraLax (over the counter) for constipation as needed.   Diet general    Complete by:  As directed    Discharge instructions    Complete by:  As directed    [place] items at home which could result in a fall. This includes throw rugs or furniture in walking pathways ICE to the affected joint every three hours while awake for 30 minutes at a time, for at least the first 3-5 days, and then as needed for pain and swelling.  Continue to use ice for pain and swelling. You may notice swelling that will progress down to the foot and ankle.  This is normal after surgery.  Elevate your leg when you are not up walking on it.   Continue to use the breathing machine you got in the hospital (incentive spirometer) which will help keep your temperature down.  It is  common for your temperature to cycle up and down following surgery, especially at night when you are not up moving around and exerting yourself.  The breathing machine keeps your lungs expanded and your temperature down.   DIET:  As you were doing prior to hospitalization, we recommend a well-balanced  diet.  DRESSING / WOUND CARE / SHOWERING  Keep the surgical dressing until follow up.  The dressing is water proof, so you can shower without any extra covering.  IF THE DRESSING FALLS OFF or the wound gets wet inside, change the dressing with sterile gauze.  Please use good hand washing techniques before changing the dressing.  Do not use any lotions or creams on the incision until instructed by your surgeon.    ACTIVITY  Increase activity slowly as tolerated, but follow the weight bearing instructions below.   No driving for 6 weeks or until further direction given by your physician.  You cannot drive while taking narcotics.  No lifting or carrying greater than 10 lbs. until further directed by your surgeon. Avoid periods of inactivity such as sitting longer than an hour when not asleep. This helps prevent blood clots.  You may return to work once you are authorized by your doctor.     WEIGHT BEARING   Partial weight bearing with assist device as directed.  50% as taught in PT   EXERCISES  Results after joint replacement surgery are often greatly improved when you follow the exercise, range of motion and muscle strengthening exercises prescribed by your doctor. Safety measures are also important to protect the joint from further injury. Any time any of these exercises cause you to have increased pain or swelling, decrease what you are doing until you are comfortable again and then slowly increase them. If you have problems or questions, call your caregiver or physical therapist for advice.   Rehabilitation is important following a joint replacement. After just a few days of immobilization,  the muscles of the leg can become weakened and shrink (atrophy).  These exercises are designed to build up the tone and strength of the thigh and leg muscles and to improve motion. Often times heat used for twenty to thirty minutes before working out will loosen up your tissues and help with improving the range of motion but do not use heat for the first two weeks following surgery (sometimes heat can increase post-operative swelling).   These exercises can be done on a training (exercise) mat, on the floor, on a table or on a bed. Use whatever works the best and is most comfortable for you.    Use music or television while you are exercising so that the exercises are a pleasant break in your day. This will make your life better with the exercises acting as a break in your routine that you can look forward to.   Perform all exercises about fifteen times, three times per day or as directed.  You should exercise both the operative leg and the other leg as well.   Exercises include:   Quad Sets - Tighten up the muscle on the front of the thigh (Quad) and hold for 5-10 seconds.   Straight Leg Raises - With your knee straight (if you were given a brace, keep it on), lift the leg to 60 degrees, hold for 3 seconds, and slowly lower the leg.  Perform this exercise against resistance later as your leg gets stronger.  Leg Slides: Lying on your back, slowly slide your foot toward your buttocks, bending your knee up off the floor (only go as far as is comfortable). Then slowly slide your foot back down until your leg is flat on the floor again.  Angel Wings: Lying on your back spread your legs to the side as far apart as you can  without causing discomfort.  Hamstring Strength:  Lying on your back, push your heel against the floor with your leg straight by tightening up the muscles of your buttocks.  Repeat, but this time bend your knee to a comfortable angle, and push your heel against the floor.  You may put a pillow  under the heel to make it more comfortable if necessary.   A rehabilitation program following joint replacement surgery can speed recovery and prevent re-injury in the future due to weakened muscles. Contact your doctor or a physical therapist for more information on knee rehabilitation.    CONSTIPATION  Constipation is defined medically as fewer than three stools per week and severe constipation as less than one stool per week.  Even if you have a regular bowel pattern at home, your normal regimen is likely to be disrupted due to multiple reasons following surgery.  Combination of anesthesia, postoperative narcotics, change in appetite and fluid intake all can affect your bowels.   YOU MUST use at least one of the following options; they are listed in order of increasing strength to get the job done.  They are all available over the counter, and you may need to use some, POSSIBLY even all of these options:    Drink plenty of fluids (prune juice may be helpful) and high fiber foods Colace 100 mg by mouth twice a day  Senokot for constipation as directed and as needed Dulcolax (bisacodyl), take with full glass of water  Miralax (polyethylene glycol) once or twice a day as needed.  If you have tried all these things and are unable to have a bowel movement in the first 3-4 days after surgery call either your surgeon or your primary doctor.    If you experience loose stools or diarrhea, hold the medications until you stool forms back up.  If your symptoms do not get better within 1 week or if they get worse, check with your doctor.  If you experience "the worst abdominal pain ever" or develop nausea or vomiting, please contact the office immediately for further recommendations for treatment.   ITCHING:  If you experience itching with your medications, try taking only a single pain pill, or even half a pain pill at a time.  You can also use Benadryl over the counter for itching or also to help with  sleep.   TED HOSE STOCKINGS:  Use stockings on both legs until for at least 2 weeks or as directed by physician office. They may be removed at night for sleeping.  MEDICATIONS:  See your medication summary on the "After Visit Summary" that nursing will review with you.  You may have some home medications which will be placed on hold until you complete the course of blood thinner medication.  It is important for you to complete the blood thinner medication as prescribed.  PRECAUTIONS:  If you experience chest pain or shortness of breath - call 911 immediately for transfer to the hospital emergency department.   If you develop a fever greater that 101 F, purulent drainage from wound, increased redness or drainage from wound, foul odor from the wound/dressing, or calf pain - CONTACT YOUR SURGEON.                                                   FOLLOW-UP APPOINTMENTS:  If  you do not already have a post-op appointment, please call the office for an appointment to be seen by your surgeon.  Guidelines for how soon to be seen are listed in your "After Visit Summary", but are typically between 1-4 weeks after surgery.  OTHER INSTRUCTIONS:   Knee Replacement:  Do not place pillow under knee, focus on keeping the knee straight while resting. CPM instructions: 0-90 degrees, 2 hours in the morning, 2 hours in the afternoon, and 2 hours in the evening. Place foam block, curve side up under heel at all times except when in CPM or when walking.  DO NOT modify, tear, cut, or change the foam block in any way.  MAKE SURE YOU:  Understand these instructions.  Get help right away if you are not doing well or get worse.    Thank you for letting us be a part of your medical care team.  It is a privilege we respect greatly.  We hope these instructions will help you stay on track for a fast and full recovery!   Do not put a pillow under the knee. Place it under the heel.    Complete by:  As directed    Driving  restrictions    Complete by:  As directed    No driving for 6 weeks   Increase activity slowly as tolerated    Complete by:  As directed    Lifting restrictions    Complete by:  As directed    No lifting for 6 weeks   Partial weight bearing    Complete by:  As directed    % Body Weight:  50%   Laterality:  right   Extremity:  Lower   Patient may shower    Complete by:  As directed    You may shower over the brown dressing   TED hose    Complete by:  As directed    Use stockings (TED hose) for 2-3 weeks on right leg.  You may remove them at night for sleeping.      DISCHARGE MEDICATIONS:     Medication List    STOP taking these medications   ADVIL 200 MG Caps Generic drug:  Ibuprofen   ALIVE WOMENS 50+ PO   aspirin EC 81 MG tablet     TAKE these medications   acetaminophen 500 MG tablet Commonly known as:  TYLENOL Take 1,000 mg by mouth every 6 (six) hours as needed (for pain.).   cholecalciferol 1000 units tablet Commonly known as:  VITAMIN D Take 1,000 Units by mouth daily.   lisinopril 10 MG tablet Commonly known as:  PRINIVIL,ZESTRIL Take 1 tablet (10 mg total) by mouth daily. TAKE ONE TABLET BY MOUTH EVERY DAY What changed:  additional instructions   Lysine 1000 MG Tabs Take 1,000 mg by mouth daily as needed (for fever blisters).   methocarbamol 500 MG tablet Commonly known as:  ROBAXIN Take 1 tablet (500 mg total) by mouth every 8 (eight) hours as needed for muscle spasms.   oxyCODONE 5 MG immediate release tablet Commonly known as:  Oxy IR/ROXICODONE Take 1-2 tablets (5-10 mg total) by mouth every 4 (four) hours as needed for breakthrough pain.   PROBIOTIC DAILY PO Take 1 capsule by mouth daily.   rivaroxaban 10 MG Tabs tablet Commonly known as:  XARELTO Take 1 tablet (10 mg total) by mouth daily with breakfast. Start taking on:  03/21/2016   SOOTHE 0.6-0.6 % Soln Generic drug:  Propylene Glycol-Glycerin  Place 1 drop into both eyes daily.     vitamin B-12 1000 MCG tablet Commonly known as:  CYANOCOBALAMIN Take 1,000 mcg by mouth daily.            Durable Medical Equipment        Start     Ordered   03/18/16 1300  DME Walker rolling  Once    Question:  Patient needs a walker to treat with the following condition  Answer:  Total knee replacement status, right   03/18/16 1259   03/18/16 1300  DME 3 n 1  Once     03/18/16 1259   03/18/16 1300  DME Bedside commode  Once    Question:  Patient needs a bedside commode to treat with the following condition  Answer:  Total knee replacement status, right   03/18/16 1259      FOLLOW UP VISIT:   Follow-up Information    Garald Balding, MD Follow up on 03/31/2016.   Specialty:  Orthopedic Surgery Contact information: [address] [place] [address] [phone]           DISPOSITION:   Home  CONDITION:  Stable   Mike Craze. [place], [place] [phone]  03/20/2016 10:28 AM

## 2016-03-20 NOTE — Progress Notes (Signed)
PATIENT ID: Katherine Stanley        MRN:  [id number]          DOB/AGE: Sep 05, 1945 / 70 y.o.    [name], MD   Biagio Borg, PA-C [address] [place], [place]  [address]                             [phone]   PROGRESS NOTE  Subjective:  negative for Chest Pain  negative for Shortness of Breath  negative for Nausea/Vomiting   negative for Calf Pain    Tolerating Diet: yes         Patient reports pain as mild and moderate.       Objective: Vital signs in last 24 hours:   Patient Vitals for the past 24 hrs:  BP Temp Temp src Pulse Resp SpO2  03/20/16 0503 (!) 164/83 98.2 F (36.8 C) Oral (!) 106 16 97 %  03/19/16 2117 127/65 99.4 F (37.4 C) Oral (!) 111 16 97 %  03/19/16 1525 135/65 99.3 F (37.4 C) Oral 92 17 96 %      Intake/Output from previous day:   12/06 0701 - 12/07 0700 In: 60 [P.O.:60] Out: -    Intake/Output this shift:   No intake/output data recorded.   Intake/Output      12/06 0701 - 12/07 0700 12/07 0701 - 12/08 0700   P.O. 60    I.V. (mL/kg)     IV Piggyback     Total Intake(mL/kg) 60 (0.8)    Urine (mL/kg/hr)     Drains     Stool     Blood     Total Output       Net +60          Urine Occurrence 6 x    Stool Occurrence 1 x       LABORATORY DATA:  Recent Labs  03/18/16 0709 03/19/16 0455 03/20/16 0432  WBC 6.5 7.0 9.3  HGB 14.0 11.1* 11.0*  HCT 40.5 33.1* 33.1*  PLT 271 215 206    Recent Labs  03/18/16 0709 03/19/16 0455 03/20/16 0432  NA 141 138 138  K 3.5 4.0 3.4*  CL 112* 104 107  CO2 18* 25 25  BUN 10 6 5*  CREATININE 0.58 0.61 0.58  GLUCOSE 112* 114* 127*  CALCIUM 9.4 8.7* 8.5*   Lab Results  Component Value Date   INR 1.04 03/12/2016    Recent Radiographic Studies :  Dg Chest 2 View  Result Date: 03/12/2016 CLINICAL DATA:  Pre op for a right knee replacement No recent chest complaintsHx of HTN- on meds, non smoker EXAM: CHEST  2 VIEW COMPARISON:  None. FINDINGS: The heart size and  mediastinal contours are within normal limits. Both lungs are clear. No pleural effusion or pneumothorax. The visualized skeletal structures are unremarkable. IMPRESSION: No active cardiopulmonary disease. Electronically Signed   By: Lajean Manes M.D.   On: 03/12/2016 10:13     Examination:  General appearance: alert, cooperative, mild distress and moderate distress  Wound Exam: clean, dry, intact   Drainage:  None: wound tissue dry  Motor Exam: EHL, FHL, Anterior Tibial and Posterior Tibial Intact  Sensory Exam: Superficial Peroneal, Deep Peroneal and Tibial normal  Vascular Exam: Right posterior tibial artery has 1+ (weak) pulse  Assessment:    2 Days Post-Op  Procedure(s) (LRB): TOTAL KNEE ARTHROPLASTY (Right)  ADDITIONAL DIAGNOSIS:  Principal Problem:  Unilateral primary osteoarthritis, right knee Active Problems:   S/P total knee replacement using cement, right     Plan: Physical Therapy as ordered Partial Weight Bearing @ 50% (PWB)  DVT Prophylaxis:  Xarelto, Foot Pumps and TED hose  DISCHARGE PLAN: Home  DISCHARGE NEEDS: HHPT, CPM, Walker and 3-in-1 comode seat        Biagio Borg  03/20/2016 10:17 AM  Patient ID: Katherine Stanley, female   DOB: 07-30-1945, 70 y.o.   MRN: [id number]

## 2016-03-20 NOTE — Care Management Note (Signed)
Case Management Note  Patient Details  Name: Katherine Stanley MRN: [id number] Date of Birth: 06-06-1945  Subjective/Objective:   70 yr old female s/p right total knee arthroplasty.                 Action/Plan: Case manager spoke with patient and husband concerning [place] and DME needs. Patient was preoperatively setup with [facility], no changes. Patient has rolling walker and CPM at home, Case manager will order 3in1.    Expected Discharge Date:    03/20/16              Expected Discharge Plan:  [place]  In-House Referral:  NA  Discharge planning Services  CM Consult  Post Acute Care Choice:  Home Health, Durable Medical Equipment Choice offered to:  Patient  DME Arranged:  3-N-1 DME Agency:  [place]:  PT [place]:  [facility] (formerly [facility])  Status of Service:  Completed, signed off  If discussed at [employer] of Stay Meetings, dates discussed:    Additional Comments:  Ninfa Meeker, RN 03/20/2016, 11:44 AM

## 2016-03-20 NOTE — Progress Notes (Signed)
Occupational Therapy Treatment/Discharge Patient Details Name: Katherine Stanley MRN: [id number] DOB: [birth year] Today's Date: 03/20/2016    History of present illness Patient is a 70 y/o female with hx of HTN presents s/p Rt TKA.   OT comments  Pt progressing well toward OT goals. Pt and husband educated on safe shower transfers, knee precautions during ADL, fall prevention, and energy conservation techniques. Pt able to complete functional toilet transfers with supervision and shower transfers with min guard assist for safety. All questions answered and pt has no further concerns. No further acute OT needs identified. Continue to recommend 3-in-1 [facility] for DME needs. OT will sign off.   Follow Up Recommendations  No OT follow up;Supervision/Assistance - 24 hour    Equipment Recommendations  3 in 1 bedside commode    Recommendations for Other Services      Precautions / Restrictions Precautions Precautions: Knee Precaution Booklet Issued: No Precaution Comments: Reviewed no pillow under knee and precautions Restrictions Weight Bearing Restrictions: Yes RLE Weight Bearing: Partial weight bearing RLE Partial Weight Bearing Percentage or Pounds: 50       Mobility Bed Mobility Overal bed mobility: Modified Independent Bed Mobility: Supine to Sit;Sit to Supine           General bed mobility comments: pt OOB in chair upon arrival  Transfers Overall transfer level: Needs assistance Equipment used: Rolling walker (2 wheeled) Transfers: Sit to/from Stand Sit to Stand: Supervision         General transfer comment: supervision for safety; good technique    Balance   Sitting-balance support: No upper extremity supported;Feet supported Sitting balance-Leahy Scale: Good     Standing balance support: Bilateral upper extremity supported;No upper extremity supported;During functional activity Standing balance-Leahy Scale: Fair Standing balance comment: Able to stand  statically with no UE support but reliant on B UE support for dynamic tasks.                   ADL Overall ADL's : Needs assistance/impaired                         Toilet Transfer: Supervision/safety;Ambulation;BSC;RW       Tub/ Shower Transfer: Min guard;Ambulation;Shower seat;3 in [employer] walker   Functional mobility during ADLs: Supervision/safety;Rolling walker General ADL Comments: Continued education on knee precautions during ADL, safe use of DME, fall prevention, energy conservation, and shower transfers.      Vision                     Perception     Praxis      Cognition   Behavior During Therapy: [facility] for tasks assessed/performed Overall Cognitive Status: Within Functional Limits for tasks assessed                       Extremity/Trunk Assessment               Exercises Total Joint Exercises Quad Sets: AROM;Right;10 reps Heel Slides: AROM;Right;15 reps Hip ABduction/ADduction: AROM;Right;15 reps Straight Leg Raises: AROM;Right;10 reps Long Arc Quad: AROM;Right;15 reps Knee Flexion: AROM;Right;5 reps;Other (comment) (10 sec holds) Goniometric ROM: 95 degrees flexion in sitting   Shoulder Instructions       General Comments      Pertinent Vitals/ Pain       Pain Assessment: Faces Faces Pain Scale: Hurts little more Pain Location: R knee with therex Pain Descriptors / Indicators: Sore Pain Intervention(s): Limited  activity within patient's tolerance;Monitored during session;Repositioned;Ice applied  Home Living                                          Prior Functioning/Environment              Frequency  Min 2X/week        Progress Toward Goals  OT Goals(current goals can now be found in the care plan section)  Progress towards OT goals: Progressing toward goals  Acute Rehab OT Goals Patient Stated Goal: go home OT Goal Formulation: With patient Time For Goal Achievement:  04/02/16 Potential to Achieve Goals: Good ADL Goals Pt Will Perform Lower Body Bathing: with modified independence;sit to/from stand Pt Will Perform Lower Body Dressing: with modified independence;sit to/from stand Pt Will Transfer to Toilet: with modified independence;ambulating Pt Will Perform Toileting - Clothing Manipulation and hygiene: with modified independence;sit to/from stand Pt Will Perform Tub/Shower Transfer: with modified independence;Shower transfer;shower seat;ambulating;rolling walker  Plan Discharge plan remains appropriate    Co-evaluation                 End of Session Equipment Utilized During Treatment: Gait belt;Rolling walker   Activity Tolerance Patient tolerated treatment well   Patient Left in chair;with call bell/phone within reach   Nurse Communication          Time: [id number] OT Time Calculation (min): 16 min  Charges: OT General Charges $OT Visit: 1 Procedure OT Treatments $Self Care/Home Management : 23-37 mins  Norman Herrlich, OTR/L [phone] 03/20/2016, 10:15 AM

## 2016-03-21 DIAGNOSIS — Z471 Aftercare following joint replacement surgery: Secondary | ICD-10-CM | POA: Diagnosis not present

## 2016-03-21 DIAGNOSIS — I10 Essential (primary) hypertension: Secondary | ICD-10-CM | POA: Diagnosis not present

## 2016-03-21 DIAGNOSIS — Z96651 Presence of right artificial knee joint: Secondary | ICD-10-CM | POA: Diagnosis not present

## 2016-03-21 DIAGNOSIS — M8580 Other specified disorders of bone density and structure, unspecified site: Secondary | ICD-10-CM | POA: Diagnosis not present

## 2016-03-21 DIAGNOSIS — M1990 Unspecified osteoarthritis, unspecified site: Secondary | ICD-10-CM | POA: Diagnosis not present

## 2016-03-24 ENCOUNTER — Encounter (INDEPENDENT_AMBULATORY_CARE_PROVIDER_SITE_OTHER): Payer: Self-pay | Admitting: Orthopedic Surgery

## 2016-03-24 DIAGNOSIS — Z96651 Presence of right artificial knee joint: Secondary | ICD-10-CM | POA: Diagnosis not present

## 2016-03-24 DIAGNOSIS — Z471 Aftercare following joint replacement surgery: Secondary | ICD-10-CM | POA: Diagnosis not present

## 2016-03-24 DIAGNOSIS — M1990 Unspecified osteoarthritis, unspecified site: Secondary | ICD-10-CM | POA: Diagnosis not present

## 2016-03-24 DIAGNOSIS — I10 Essential (primary) hypertension: Secondary | ICD-10-CM | POA: Diagnosis not present

## 2016-03-24 DIAGNOSIS — M8580 Other specified disorders of bone density and structure, unspecified site: Secondary | ICD-10-CM | POA: Diagnosis not present

## 2016-03-25 ENCOUNTER — Other Ambulatory Visit (INDEPENDENT_AMBULATORY_CARE_PROVIDER_SITE_OTHER): Payer: Self-pay

## 2016-03-25 ENCOUNTER — Telehealth (INDEPENDENT_AMBULATORY_CARE_PROVIDER_SITE_OTHER): Payer: Self-pay | Admitting: Orthopaedic Surgery

## 2016-03-25 DIAGNOSIS — M25562 Pain in left knee: Principal | ICD-10-CM

## 2016-03-25 DIAGNOSIS — G8929 Other chronic pain: Secondary | ICD-10-CM

## 2016-03-25 DIAGNOSIS — Z96651 Presence of right artificial knee joint: Secondary | ICD-10-CM

## 2016-03-25 NOTE — Telephone Encounter (Signed)
OK to send? ROM and strengthening of left knee

## 2016-03-25 NOTE — Telephone Encounter (Signed)
Ok to send to PT

## 2016-03-25 NOTE — Telephone Encounter (Signed)
Patient is requesting orders be sent to [facility] for outpatient physical therapy so patient can start therapy next Tuesday. Please advise.

## 2016-03-26 ENCOUNTER — Telehealth: Payer: Self-pay | Admitting: Family Medicine

## 2016-03-26 DIAGNOSIS — I10 Essential (primary) hypertension: Secondary | ICD-10-CM | POA: Diagnosis not present

## 2016-03-26 DIAGNOSIS — M8580 Other specified disorders of bone density and structure, unspecified site: Secondary | ICD-10-CM | POA: Diagnosis not present

## 2016-03-26 DIAGNOSIS — Z96651 Presence of right artificial knee joint: Secondary | ICD-10-CM | POA: Diagnosis not present

## 2016-03-26 DIAGNOSIS — M1990 Unspecified osteoarthritis, unspecified site: Secondary | ICD-10-CM | POA: Diagnosis not present

## 2016-03-26 DIAGNOSIS — Z471 Aftercare following joint replacement surgery: Secondary | ICD-10-CM | POA: Diagnosis not present

## 2016-03-26 NOTE — Telephone Encounter (Signed)
HFU, Pt was discharged from the hospital on 03/20/16. Dx was Total right knee replacement. Pt is scheduled for 04/01/16. Pt went home with Home health at [facility] [facility]. Pt was called and is aware of the appointment. Thank you!

## 2016-03-26 NOTE — Consult Note (Signed)
            [facility] CM Primary Care Navigator  03/26/2016  KEIA SHAWN 1945-10-31 [id number]    Primary care provider's office contacted([name])to notify of patient's discharge home with home health ([facility]) and possible need for post hospital follow-up and transition of care. Made aware to refer patient to [facility] care management for care coordination needs if deemed appropriate  For additional questions please contact:  Edwena Felty A. Vrinda Heckstall, BSN, RN-BC [facility] PRIMARY CARE Navigator Cell: [phone]

## 2016-03-27 NOTE — Telephone Encounter (Signed)
Noted and agree. It is reasonable for her to keep the follow-up in January as she is seeing orthopedics.

## 2016-03-27 NOTE — Telephone Encounter (Signed)
Spoke with patient she is following up with ortho on Monday and doesn't want to do a HFU with PCP as it is unnecessary to her.  She has a follow up appt in January with Dr. Caryl Bis and will wait until then.  I have cancelled appt at this time, thanks

## 2016-03-28 DIAGNOSIS — M8580 Other specified disorders of bone density and structure, unspecified site: Secondary | ICD-10-CM | POA: Diagnosis not present

## 2016-03-28 DIAGNOSIS — Z96651 Presence of right artificial knee joint: Secondary | ICD-10-CM | POA: Diagnosis not present

## 2016-03-28 DIAGNOSIS — Z471 Aftercare following joint replacement surgery: Secondary | ICD-10-CM | POA: Diagnosis not present

## 2016-03-28 DIAGNOSIS — M1990 Unspecified osteoarthritis, unspecified site: Secondary | ICD-10-CM | POA: Diagnosis not present

## 2016-03-28 DIAGNOSIS — I10 Essential (primary) hypertension: Secondary | ICD-10-CM | POA: Diagnosis not present

## 2016-03-31 ENCOUNTER — Encounter (INDEPENDENT_AMBULATORY_CARE_PROVIDER_SITE_OTHER): Payer: Self-pay | Admitting: Orthopaedic Surgery

## 2016-03-31 ENCOUNTER — Ambulatory Visit (INDEPENDENT_AMBULATORY_CARE_PROVIDER_SITE_OTHER): Payer: Medicare Other | Admitting: Orthopaedic Surgery

## 2016-03-31 ENCOUNTER — Ambulatory Visit (INDEPENDENT_AMBULATORY_CARE_PROVIDER_SITE_OTHER): Payer: Medicare Other

## 2016-03-31 VITALS — BP 139/82 | Ht 61.75 in | Wt 162.0 lb

## 2016-03-31 DIAGNOSIS — Z471 Aftercare following joint replacement surgery: Secondary | ICD-10-CM | POA: Diagnosis not present

## 2016-03-31 DIAGNOSIS — Z96651 Presence of right artificial knee joint: Secondary | ICD-10-CM

## 2016-03-31 DIAGNOSIS — M8580 Other specified disorders of bone density and structure, unspecified site: Secondary | ICD-10-CM | POA: Diagnosis not present

## 2016-03-31 DIAGNOSIS — M1990 Unspecified osteoarthritis, unspecified site: Secondary | ICD-10-CM | POA: Diagnosis not present

## 2016-03-31 DIAGNOSIS — I10 Essential (primary) hypertension: Secondary | ICD-10-CM | POA: Diagnosis not present

## 2016-03-31 MED ORDER — OXYCODONE-ACETAMINOPHEN 5-325 MG PO TABS: 1.0000 | ORAL_TABLET | Freq: Every evening | ORAL | 0 refills | Status: DC | PRN

## 2016-03-31 NOTE — Progress Notes (Signed)
Office Visit Note   Patient: Katherine Stanley           Date of Birth: [birth year]           MRN: [id number] Visit Date: 03/31/2016              Requested by: [name], MD [address] [place], [place] [address] PCP: [name], MD   Assessment & Plan: Visit Diagnoses: 2 weeks status post right total knee replacement without complications.  Plan: Discontinue xarelto, discontinue the support stockings and may begin weightbearing as tolerated with a walker or cane. I will see Katherine Stanley 2 weeks I will also renew oxycodone 08/15/2023 #20   Orders:  No orders of the defined types were placed in this encounter.  No orders of the defined types were placed in this encounter.     Procedures: No procedures performed   Clinical Data: No additional findings.   Subjective: No chief complaint on file.   Pt is 2 weeks post op of Right TKA.  Pt ambulates with walker.    Review of Systems   Objective: Vital Signs: There were no vitals taken for this visit.  Physical Exam  Ortho Exam right knee exam reveals the wound to be healing nicely without evidence of infection. The clips are removed and Steri-Strips applied. Very minimal swelling. Full extension and about 96 of flexion. No calf pain no popliteal discomfort. Her vascular exam intact  Specialty Comments:  No specialty comments available.  Imaging: No results found.   PMFS History: Patient Active Problem List   Diagnosis Date Noted  . Unilateral primary osteoarthritis, right knee 03/18/2016  . S/P total knee replacement using cement, right 03/18/2016  . Pre-operative cardiovascular examination 03/10/2016  . Routine general medical examination at a health care facility 10/24/2015  . 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 Osteopenia 08/23/2012  . Medicare annual wellness visit, subsequent 07/19/2012  . Screening for breast cancer 07/19/2012  . Postmenopausal estrogen deficiency 07/19/2012   . Hypertension 10/27/2011  . DUB (dysfunctional uterine bleeding)   . Rosacea    Past Medical History:  Diagnosis Date  . Arthritis   . Diverticulosis   . Headache    occasionally  . Hypertension    takes Lisinopril daily  . Joint pain   . Osteopenia   . Rosacea     Family History  Problem Relation Age of Onset  . Hypertension Mother   . Heart disease Mother   . Kidney disease Mother   . Diabetes Father   . Hypertension Father   . Heart disease Father   . Heart disease Brother   . Cancer Maternal Aunt   . Ovarian cancer Maternal Aunt 85  . Breast cancer Neg Hx     Past Surgical History:  Procedure Laterality Date  . CHOLECYSTECTOMY    . CHOLECYSTECTOMY    . COLONOSCOPY    . REFRACTIVE SURGERY    . SHOULDER ARTHROSCOPY WITH OPEN ROTATOR CUFF REPAIR AND DISTAL CLAVICLE ACROMINECTOMY Right 09/19/2014   Procedure: SHOULDER ARTHROSCOPY WITH MINI-OPEN ROTATOR CUFF REPAIR AND DISTAL CLAVICLE RESECTION, SUBACROMIAL DECOMPRESSION;  Surgeon: Garald Balding, MD;  Location: [place];  Service: Orthopedics;  Laterality: Right;  . TONSILLECTOMY  at age 61  . 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 TOTAL KNEE ARTHROPLASTY Right 03/18/2016   Procedure: TOTAL KNEE ARTHROPLASTY;  Surgeon: Garald Balding, MD;  Location: [place];  Service: Orthopedics;  Laterality: Right;  Marland Kitchen VAGINAL HYSTERECTOMY  at age 69   Social History   Occupational History  . Not on file.   Social History Main Topics  . Smoking status: Never Smoker  . Smokeless tobacco: Never Used  . Alcohol use No  . Drug use: No  . Sexual activity: Yes    Birth control/ protection: Post-menopausal, Surgical

## 2016-04-01 ENCOUNTER — Ambulatory Visit: Payer: Medicare Other | Admitting: Family Medicine

## 2016-04-02 ENCOUNTER — Ambulatory Visit: Payer: Medicare Other | Attending: Orthopaedic Surgery | Admitting: Physical Therapy

## 2016-04-02 ENCOUNTER — Encounter: Payer: Self-pay | Admitting: Physical Therapy

## 2016-04-02 VITALS — BP 161/95 | HR 124

## 2016-04-02 DIAGNOSIS — M25561 Pain in right knee: Secondary | ICD-10-CM | POA: Diagnosis not present

## 2016-04-02 DIAGNOSIS — M6281 Muscle weakness (generalized): Secondary | ICD-10-CM | POA: Diagnosis not present

## 2016-04-02 DIAGNOSIS — R2681 Unsteadiness on feet: Secondary | ICD-10-CM | POA: Insufficient documentation

## 2016-04-02 NOTE — Therapy (Signed)
[place] MAIN [facility] SERVICES [address] [place], [place], [address] Phone: [phone]   Fax:  [phone]  Physical Therapy Evaluation  Patient Details  Name: Katherine Stanley MRN: [id number] Date of Birth: [birth year] Referring Provider: Jonathon Resides  Encounter Date: 04/02/2016      PT End of Session - 04/02/16 1603    Visit Number 1   Number of Visits 17   Date for PT Re-Evaluation June 25, 2016   Authorization Type g codes 1/10   PT Start Time 1517   PT Stop Time 1609   PT Time Calculation (min) 52 min   Activity Tolerance Patient tolerated treatment well;Treatment limited secondary to medical complications (Comment)  HR elevated to 124 with activity, down to 90s at rest.   Behavior During Therapy [facility] for tasks assessed/performed      Past Medical History:  Diagnosis Date  . Arthritis   . Diverticulosis   . Headache    occasionally  . Hypertension    takes Lisinopril daily  . Joint pain   . Osteopenia   . Rosacea     Past Surgical History:  Procedure Laterality Date  . CHOLECYSTECTOMY    . CHOLECYSTECTOMY    . COLONOSCOPY    . REFRACTIVE SURGERY    . SHOULDER ARTHROSCOPY WITH OPEN ROTATOR CUFF REPAIR AND DISTAL CLAVICLE ACROMINECTOMY Right 09/19/2014   Procedure: SHOULDER ARTHROSCOPY WITH MINI-OPEN ROTATOR CUFF REPAIR AND DISTAL CLAVICLE RESECTION, SUBACROMIAL DECOMPRESSION;  Surgeon: Garald Balding, MD;  Location: [place];  Service: Orthopedics;  Laterality: Right;  . TONSILLECTOMY  at age 73  . 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 TOTAL KNEE ARTHROPLASTY Right 03/18/2016   Procedure: TOTAL KNEE ARTHROPLASTY;  Surgeon: Garald Balding, MD;  Location: [place];  Service: Orthopedics;  Laterality: Right;  Marland Kitchen VAGINAL HYSTERECTOMY  at age 89    Vitals:   04/02/16 1538  BP: (!) 161/95  Pulse: (!) 124  SpO2: 96%         Subjective Assessment - 04/02/16 1540    Subjective s/p R TKA 03/18/16   Pertinent History  Pt is a 70 y/o F s/p R TKA on 03/18/16. Pt was initially PWB 50% but pt saw her MD on 12/18 who told her she can update to WBAT.  Pt reports her last HHPT appointment was on 12/18.  Pt enjoys playing golf and would like to get back to doing this, the last time she played was the week before surgery.  HR reading 109-124 when pt initially arrived and was resting in seated position.  Had pt lay down and HR 112 manually and after a few minutes down to 94.  Pt admits that she has been very anxious about this evaluation.  At the end of the session her husband reports that the pt has been nervous about this appointment all day.  Pt did not bring in her HEP. Not on her medication list currently, pt is taking a low dose aspirin beginning this morning, she reports this was prescribed by her MD.    Limitations Sitting;Walking;Standing   How long can you sit comfortably? 1 hour   How long can you stand comfortably? 20-30 mins   How long can you walk comfortably? ~10 minutes   Patient Stated Goals get back to golfing, working out in the yard   Currently in Pain? Yes   Pain Score 3    Pain Location Knee   Pain Orientation Right   Pain Descriptors /  Indicators Aching   Pain Type Surgical pain   Pain Onset 1 to 4 weeks ago   Pain Frequency Constant   Pain Relieving Factors ice, pain medicine   Multiple Pain Sites No            [facility] PT Assessment - 04/02/16 1543      Assessment   Medical Diagnosis s/p R TKA   Referring Provider Jonathon Resides   Onset Date/Surgical Date 03/18/16   Hand Dominance Right   Next MD Visit 1/518   Prior Therapy Yes 09/2014 for her R shoulder     Precautions   Precautions Fall;Knee   Precaution Booklet Issued No   Precaution Comments Pt recalls no pillow under knee     Restrictions   Weight Bearing Restrictions Yes   RLE Weight Bearing Weight bearing as tolerated     Balance Screen   Has the patient fallen in the past 6 months No   Has the patient had a decrease in  activity level because of a fear of falling?  No   Is the patient reluctant to leave their home because of a fear of falling?  No     Home Environment   Living Environment Private residence   Living Arrangements Spouse/significant other   Available Help at Discharge Family;Available 24 hours/day   Type of Home House   Home Access Level entry   Home Layout Multi-level;Able to live on main level with bedroom/bathroom   Alternate Level Stairs-Number of Steps flight   Alternate Level Stairs-Rails Left   Home Equipment Cane - quad;Walker - 2 wheels;Bedside commode;Shower seat     Prior Function   Level of Independence Independent with gait;Independent with transfers;Independent with basic ADLs   Vocation Retired   Leisure golfing, gardening     Cognition   Overall Cognitive Status Within Functional Limits for tasks assessed     Observation/Other Assessments   Observations Surgical site intact and appears to be healing well, stitches removed 12/18.  Steri strips in place.     Sensation   Light Touch Appears Intact     ROM / Strength   AROM / PROM / Strength Strength     Strength   Overall Strength Deficits   Strength Assessment Site Hip;Knee;Ankle   Right/Left Hip Right;Left   Right Hip Flexion 3/5   Right Hip ABduction 4/5   Right Hip ADduction 4+/5   Left Hip Flexion 4+/5   Left Hip ABduction 5/5   Left Hip ADduction 5/5   Right/Left Knee Right;Left   Right Knee Flexion 3+/5   Right Knee Extension 3+/5   Left Knee Flexion 5/5   Left Knee Extension 5/5   Right/Left Ankle Right;Left   Right Ankle Dorsiflexion 3+/5   Left Ankle Dorsiflexion 5/5      OBJECTIVE   Knee AROM L, R in deg:  F: 135, 84  E: 0, lacking 10   Knee PROM L, R in deg:  F: 135, 92  E: 0, lacking 8   Objective measures completed and results explained to the pt:  10MWT: 0.73 m/s (without AD)  5xSTS: 10.50 sec  LEFS: 55/80, MDC 9 pts   Gait Analysis: Dec stance time and weight shift RLE. Dec  R knee flexion and dec R hip extension.            PT Education - 04/02/16 1602    Education provided Yes   Education Details POC, Role of OPPT   Person(s) Educated Patient  Methods Explanation   Comprehension Verbalized understanding;Need further instruction             PT Long Term Goals - 07-Apr-2016 1621      PT LONG TERM GOAL #1   Title Pt will improve LEFS to at least 64/80 to demonstrate decreased LE disability and improved LE functional ability    Baseline 55/80 on 04/08/23   Time 6   Period Weeks   Status New     PT LONG TERM GOAL #2   Title Pt will improve RLE MMT of hip, knee, ankle to 5/5 to demonstrate improved strength and function   Baseline see Evaulation note   Time 8   Period Weeks   Status New     PT LONG TERM GOAL #3   Title Pt will improve 71mWT to at least 1.8 m/s to improve gait speed to that of a community ambulator (without AD)   Baseline 0.73 m/s (without AD)   Time 6   Period Weeks   Status New     PT LONG TERM GOAL #4   Title Pt will improve R Knee flexion AROM to within 10 deg of L knee flexion AROM and will improve R knee extension AROM to 0 deg   Baseline R knee flexion AROM: 84 deg, R knee extension AROM: lacking 10 deg   Time 6   Period Weeks   Status New               Plan - Apr 07, 2016 1857    Clinical Impression Statement Pt presents with much anxiety about initiating OPPT and this is reflected in her elevated HR and BP.  Pt instructed that if her anxiety does not improve after a few hours of rest at home that she needs to contact her MD, pt and husband verbalized understanding.  She is s/p R TKA with resultant weakness RLE as evidenced by MMT.  She demonstrates a decresaed gait speed as reflected in her 65mWT and her LEFS suggests limited functional ability of her LE.  She has limited R knee AROM and PROM restrictions as expected s/p R TKA.  Mrs. Zaher will benefit from skilled PT interventions for improved gait mechanics  and speed, strength, balance, and QOL.   Rehab Potential Good   Clinical Impairments Affecting Rehab Potential (-) severe anxiety about OPPT, +pt very motivated to return to PLOF   PT Frequency 2x / week   PT Duration 8 weeks   PT Treatment/Interventions ADLs/Self Care Home Management;Aquatic Therapy;Cryotherapy;Electrical Stimulation;Moist Heat;Iontophoresis 4mg /ml Dexamethasone;Ultrasound;DME Instruction;Gait training;Stair training;Functional mobility training;Therapeutic activities;Therapeutic exercise;Balance training;Neuromuscular re-education;Patient/family education;Manual techniques;Scar mobilization;Passive range of motion;Dry needling;Energy conservation;Taping   PT Next Visit Plan Review HEP (pt to bring in at next session); progress HEP as appropriate; initiate strengthening and ROM program   PT Home Exercise Plan to review/initiate next session   Consulted and Agree with Plan of Care Patient      Patient will benefit from skilled therapeutic intervention in order to improve the following deficits and impairments:  Abnormal gait, Decreased activity tolerance, Decreased balance, Decreased endurance, Decreased knowledge of precautions, Decreased knowledge of use of DME, Decreased mobility, Decreased range of motion, Decreased safety awareness, Decreased scar mobility, Decreased strength, Difficulty walking, Hypomobility, Increased edema, Increased fascial restricitons, Increased muscle spasms, Impaired perceived functional ability, Impaired flexibility, Postural dysfunction, Improper body mechanics, Pain  Visit Diagnosis: Acute pain of right knee  Unsteadiness on feet  Muscle weakness (generalized)      G-Codes - 04-07-2016 1621  Functional Assessment Tool Used 5xSTS, 57mWT, MMT, ROM, LEFS, Clinical Judgement   Functional Limitation Mobility: Walking and moving around   Mobility: Walking and Moving Around Current Status [phone]) At least 20 percent but less than 40 percent  impaired, limited or restricted   Mobility: Walking and Moving Around Goal Status [phone]) At least 1 percent but less than 20 percent impaired, limited or restricted       Problem List Patient Active Problem List   Diagnosis Date Noted  . Unilateral primary osteoarthritis, right knee 03/18/2016  . S/P total knee replacement using cement, right 03/18/2016  . Pre-operative cardiovascular examination 03/10/2016  . Routine general medical examination at a health care facility 10/24/2015  . 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 Osteopenia 08/23/2012  . Medicare annual wellness visit, subsequent 07/19/2012  . Screening for breast cancer 07/19/2012  . 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 Hypertension 10/27/2011  . DUB (dysfunctional uterine bleeding)   . Rosacea     Collie Siad PT, DPT 04/02/2016, 7:05 PM  [place] MAIN [facility] SERVICES [address] [place], [place], [address] Phone: [phone]   Fax:  [phone]  Name: Katherine Stanley MRN: [id number] Date of Birth: Jul 29, 1945

## 2016-04-03 NOTE — Addendum Note (Signed)
Addended by: Collie Siad D on: 04/03/2016 10:54 AM   Modules accepted: Orders

## 2016-04-10 ENCOUNTER — Ambulatory Visit: Payer: Medicare Other

## 2016-04-10 VITALS — BP 143/86 | HR 110

## 2016-04-10 DIAGNOSIS — M6281 Muscle weakness (generalized): Secondary | ICD-10-CM | POA: Diagnosis not present

## 2016-04-10 DIAGNOSIS — R2681 Unsteadiness on feet: Secondary | ICD-10-CM

## 2016-04-10 DIAGNOSIS — M25561 Pain in right knee: Secondary | ICD-10-CM

## 2016-04-10 NOTE — Patient Instructions (Signed)
SIT TO STAND: Feet Apart    Place feet apart. Lean chest forward. Raise hips and straighten knees to stand. _10__ reps per set, _2__ sets per session, 2 times/day.

## 2016-04-10 NOTE — Therapy (Signed)
[place] MAIN [facility] SERVICES [address] [place], [place], [address] Phone: [phone]   Fax:  [phone]  Physical Therapy Treatment  Patient Details  Name: Katherine Stanley MRN: [id number] Date of Birth: Mar 02, 1946 Referring Provider: Jonathon Resides  Encounter Date: 04/10/2016      PT End of Session - 04/10/16 0805    Visit Number 2   Number of Visits 17   Date for PT Re-Evaluation 2016-06-21   Authorization Type g codes 06-17-2022   PT Start Time 0800   PT Stop Time 0845   PT Time Calculation (min) 45 min   Equipment Utilized During Treatment Gait belt   Activity Tolerance Patient tolerated treatment well  HR elevated to 124 with activity, down to 90s at rest.   Behavior During Therapy [facility] for tasks assessed/performed      Past Medical History:  Diagnosis Date  . Arthritis   . Diverticulosis   . Headache    occasionally  . Hypertension    takes Lisinopril daily  . Joint pain   . Osteopenia   . Rosacea     Past Surgical History:  Procedure Laterality Date  . CHOLECYSTECTOMY    . CHOLECYSTECTOMY    . COLONOSCOPY    . REFRACTIVE SURGERY    . SHOULDER ARTHROSCOPY WITH OPEN ROTATOR CUFF REPAIR AND DISTAL CLAVICLE ACROMINECTOMY Right 09/19/2014   Procedure: SHOULDER ARTHROSCOPY WITH MINI-OPEN ROTATOR CUFF REPAIR AND DISTAL CLAVICLE RESECTION, SUBACROMIAL DECOMPRESSION;  Surgeon: Garald Balding, MD;  Location: [place];  Service: Orthopedics;  Laterality: Right;  . TONSILLECTOMY  at age 70  . 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 TOTAL KNEE ARTHROPLASTY Right 03/18/2016   Procedure: TOTAL KNEE ARTHROPLASTY;  Surgeon: Garald Balding, MD;  Location: [place];  Service: Orthopedics;  Laterality: Right;  Marland Kitchen VAGINAL HYSTERECTOMY  at age 70    Vitals:   04/10/16 0803  BP: (!) 143/86  Pulse: (!) 110  SpO2: 99%        Subjective Assessment - 04/10/16 0802    Subjective Pt reports she is doing well on this date. She denies  pain currently but reports some knee stiffness. She is performing HEP but reports that she avoids heel slides due to increase in knee pain. No specific questions or concerns at this time.    Pertinent History Pt is a 70 y/o F s/p R TKA on 03/18/16. Pt was initially PWB 50% but pt saw her MD on 12/18 who told her she can update to WBAT.  Pt reports her last HHPT appointment was on 12/18.  Pt enjoys playing golf and would like to get back to doing this, the last time she played was the week before surgery.  HR reading 109-124 when pt initially arrived and was resting in seated position.  Had pt lay down and HR 112 manually and after a few minutes down to 94.  Pt admits that she has been very anxious about this evaluation.  At the end of the session her husband reports that the pt has been nervous about this appointment all day.  Pt did not bring in her HEP. Not on her medication list currently, pt is taking a low dose aspirin beginning this morning, she reports this was prescribed by her MD.    Limitations Sitting;Walking;Standing   How long can you sit comfortably? 1 hour   How long can you stand comfortably? 20-30 mins   How long can you walk comfortably? ~10  minutes   Patient Stated Goals get back to golfing, working out in the yard   Currently in Pain? No/denies          TREATMENT  THER-EX NuStep L1 x 5 minutes for warm-up during history (2 minutes unbilled); Extensive HEP review with patient; Quad sets with heel on bolster 2 x 10 with IFC applied at pt tolerated intensity to R knee to minimize quad inhibition (poor quad recruitment noted during quad sets initially); RLE SLR 2 x 10; R knee quad set with very mild OP for extension stretch 5s hold, pt does not tolerate well so discontinued; L sidelying R hip abduction 2 x 10; L sidelying R clams for ER with manual resistance 2 x 10; Hooklying bridges 2 x 10; Sit to stand from mat table without UE support x 10, from chair x 10; Seated RLE  LAQ with manual resistance 2 x 10; Pt issued sit to stand handout to add to HEP;                            PT Long Term Goals - 04/02/16 1621      PT LONG TERM GOAL #1   Title Pt will improve LEFS to at least 64/80 to demonstrate decreased LE disability and improved LE functional ability    Baseline 55/80 on 12/20   Time 6   Period Weeks   Status New     PT LONG TERM GOAL #2   Title Pt will improve RLE MMT of hip, knee, ankle to 5/5 to demonstrate improved strength and function   Baseline see Evaulation note   Time 8   Period Weeks   Status New     PT LONG TERM GOAL #3   Title Pt will improve 50mWT to at least 1.8 m/s to improve gait speed to that of a community ambulator (without AD)   Baseline 0.73 m/s (without AD)   Time 6   Period Weeks   Status New     PT LONG TERM GOAL #4   Title Pt will improve R Knee flexion AROM to within 10 deg of L knee flexion AROM and will improve R knee extension AROM to 0 deg   Baseline R knee flexion AROM: 84 deg, R knee extension AROM: lacking 10 deg   Time 6   Period Weeks   Status New               Plan - 04/10/16 0807    Clinical Impression Statement Pt requires cues for equal weight distribution during sit to stands due to decreased weight shifting to RLE. She demonstrates improved R quad contraction with estim in place during quad sets. Pt encouraged to add sit to stands without UE support to HEP. Follow-up as scheduled. Will continue to progress HEP at next visit.    Rehab Potential Good   Clinical Impairments Affecting Rehab Potential (-) severe anxiety about OPPT, +pt very motivated to return to PLOF   PT Frequency 2x / week   PT Duration 8 weeks   PT Treatment/Interventions ADLs/Self Care Home Management;Aquatic Therapy;Cryotherapy;Electrical Stimulation;Moist Heat;Iontophoresis 4mg /ml Dexamethasone;Ultrasound;DME Instruction;Gait training;Stair training;Functional mobility training;Therapeutic  activities;Therapeutic exercise;Balance training;Neuromuscular re-education;Patient/family education;Manual techniques;Scar mobilization;Passive range of motion;Dry needling;Energy conservation;Taping   PT Next Visit Plan progress strengthening and ROM, progress HEP as tolerated   PT Home Exercise Plan Sit to stand without UE support, standing mini squats, standing hip abduction, standing slow marches, seated LAQ, supine/seated  knee flexion and extension stretches   Consulted and Agree with Plan of Care Patient      Patient will benefit from skilled therapeutic intervention in order to improve the following deficits and impairments:  Abnormal gait, Decreased activity tolerance, Decreased balance, Decreased endurance, Decreased knowledge of precautions, Decreased knowledge of use of DME, Decreased mobility, Decreased range of motion, Decreased safety awareness, Decreased scar mobility, Decreased strength, Difficulty walking, Hypomobility, Increased edema, Increased fascial restricitons, Increased muscle spasms, Impaired perceived functional ability, Impaired flexibility, Postural dysfunction, Improper body mechanics, Pain  Visit Diagnosis: Acute pain of right knee  Unsteadiness on feet     Problem List Patient Active Problem List   Diagnosis Date Noted  . Unilateral primary osteoarthritis, right knee 03/18/2016  . S/P total knee replacement using cement, right 03/18/2016  . Pre-operative cardiovascular examination 03/10/2016  . Routine general medical examination at a health care facility 10/24/2015  . 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 Osteopenia 08/23/2012  . Medicare annual wellness visit, subsequent 07/19/2012  . Screening for breast cancer 07/19/2012  . 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 Hypertension 10/27/2011  . DUB (dysfunctional uterine bleeding)   . Rosacea    Phillips Grout PT, DPT   Christon Parada 04/10/2016, 9:09 AM  [place] MAIN [facility] SERVICES [address] [place], [place], [address] Phone: [phone]   Fax:  [phone]  Name: Katherine Stanley MRN: [id number] Date of Birth: Feb 09, 1946

## 2016-04-15 ENCOUNTER — Ambulatory Visit: Payer: Medicare Other | Attending: Orthopaedic Surgery | Admitting: Physical Therapy

## 2016-04-15 ENCOUNTER — Encounter: Payer: Self-pay | Admitting: Physical Therapy

## 2016-04-15 DIAGNOSIS — M6281 Muscle weakness (generalized): Secondary | ICD-10-CM

## 2016-04-15 DIAGNOSIS — R2681 Unsteadiness on feet: Secondary | ICD-10-CM | POA: Diagnosis not present

## 2016-04-15 DIAGNOSIS — M25561 Pain in right knee: Secondary | ICD-10-CM | POA: Insufficient documentation

## 2016-04-15 NOTE — Therapy (Addendum)
[place] MAIN [facility] SERVICES [address] [place], [place], [address] Phone: [phone]   Fax:  [phone]  Physical Therapy Treatment  Patient Details  Name: Katherine Stanley MRN: [id number] Date of Birth: [birth year] Referring Provider: Jonathon Resides  Encounter Date: 04/15/2016      PT End of Session - 04/15/16 1353    Visit Number 3   Number of Visits 17   Date for PT Re-Evaluation 06/10/16   Authorization Type g codes 2022/07/05   PT Start Time 0145   PT Stop Time 0230   PT Time Calculation (min) 45 min   Equipment Utilized During Treatment Gait belt   Activity Tolerance Patient tolerated treatment well  HR elevated to 124 with activity, down to 90s at rest.   Behavior During Therapy [facility] for tasks assessed/performed      Past Medical History:  Diagnosis Date  . Arthritis   . Diverticulosis   . Headache    occasionally  . Hypertension    takes Lisinopril daily  . Joint pain   . Osteopenia   . Rosacea     Past Surgical History:  Procedure Laterality Date  . CHOLECYSTECTOMY    . CHOLECYSTECTOMY    . COLONOSCOPY    . REFRACTIVE SURGERY    . SHOULDER ARTHROSCOPY WITH OPEN ROTATOR CUFF REPAIR AND DISTAL CLAVICLE ACROMINECTOMY Right 09/19/2014   Procedure: SHOULDER ARTHROSCOPY WITH MINI-OPEN ROTATOR CUFF REPAIR AND DISTAL CLAVICLE RESECTION, SUBACROMIAL DECOMPRESSION;  Surgeon: Garald Balding, MD;  Location: [place];  Service: Orthopedics;  Laterality: Right;  . TONSILLECTOMY  at age 71  . 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 TOTAL KNEE ARTHROPLASTY Right 03/18/2016   Procedure: TOTAL KNEE ARTHROPLASTY;  Surgeon: Garald Balding, MD;  Location: [place];  Service: Orthopedics;  Laterality: Right;  Marland Kitchen VAGINAL HYSTERECTOMY  at age 71    There were no vitals filed for this visit.      Subjective Assessment - 04/15/16 1351    Subjective Pt reports she is doing well on this date. She denies pain currently but reports some  knee stiffness. She is performing HEP  No specific questions or concerns at this time. She thinks that she is getting more motion with her knee after using a cream  on her knee.    Pertinent History Pt is a 71 y/o F s/p R TKA on 03/18/16. Pt was initially PWB 50% but pt saw her MD on 12/18 who told her she can update to WBAT.  Pt reports her last HHPT appointment was on 12/18.  Pt enjoys playing golf and would like to get back to doing this, the last time she played was the week before surgery.  HR reading 109-124 when pt initially arrived and was resting in seated position.  Had pt lay down and HR 112 manually and after a few minutes down to 94.  Pt admits that she has been very anxious about this evaluation.  At the end of the session her husband reports that the pt has been nervous about this appointment all day.  Pt did not bring in her HEP. Not on her medication list currently, pt is taking a low dose aspirin beginning this morning, she reports this was prescribed by her MD.    Limitations Sitting;Walking;Standing   How long can you sit comfortably? 1 hour   How long can you stand comfortably? 20-30 mins   How long can you walk comfortably? ~10 minutes  Patient Stated Goals get back to golfing, working out in the yard   Currently in Pain? Yes   Pain Score 2    Pain Location Knee   Pain Orientation Right   Pain Type Acute pain   Pain Onset 1 to 4 weeks ago   Pain Frequency Constant   Pain Relieving Factors ice and pain medicine   Multiple Pain Sites No      THER-EX NuStep L1 x 5 minutes for warm-up; Quad sets  2 x 10  RLE SLR 2 x 10; R knee quad set with very mild OP for extension stretch 5s hold, L sidelying R hip abduction 2 x 10; Seated RLE LAQ with manual resistance 2 x 10; Prone knee flex x 10 with 5 sec hold Knee flex PROM x 10 with 5 sec hold SLR with ER x 10  SLR with intervals 30 and 60 deg x 10  Min verbal cues used throughout  Continues to have balance deficits typical  with diagnosis. Patient performs intermediate level exercises with pain behaviors and needs verbal cuing for correct technique.                         PT Education - 04/15/16 1353    Education provided Yes   Education Details POC   Person(s) Educated Patient   Methods Explanation   Comprehension Verbalized understanding             PT Long Term Goals - 04/02/16 1621      PT LONG TERM GOAL #1   Title Pt will improve LEFS to at least 64/80 to demonstrate decreased LE disability and improved LE functional ability    Baseline 55/80 on 12/20   Time 6   Period Weeks   Status New     PT LONG TERM GOAL #2   Title Pt will improve RLE MMT of hip, knee, ankle to 5/5 to demonstrate improved strength and function   Baseline see Evaulation note   Time 8   Period Weeks   Status New     PT LONG TERM GOAL #3   Title Pt will improve 85mWT to at least 1.8 m/s to improve gait speed to that of a community ambulator (without AD)   Baseline 0.73 m/s (without AD)   Time 6   Period Weeks   Status New     PT LONG TERM GOAL #4   Title Pt will improve R Knee flexion AROM to within 10 deg of L knee flexion AROM and will improve R knee extension AROM to 0 deg   Baseline R knee flexion AROM: 84 deg, R knee extension AROM: lacking 10 deg   Time 6   Period Weeks   Status New               Plan - 04/15/16 1358    Clinical Impression Statement Patient is able to perform PROM and AROM exercises in prone, sitting and supine and HEP was reviewed. She is making progress in PROM -2 to 100 deg. Patient is able to tolerate PROM and does not have any increased pain following therapy.    Rehab Potential Good   Clinical Impairments Affecting Rehab Potential (-) severe anxiety about OPPT, +pt very motivated to return to PLOF   PT Frequency 2x / week   PT Duration 8 weeks   PT Treatment/Interventions ADLs/Self Care Home Management;Aquatic Therapy;Cryotherapy;Electrical  Stimulation;Moist Heat;Iontophoresis 4mg /ml Dexamethasone;Ultrasound;DME Instruction;Gait training;Stair training;Functional mobility training;Therapeutic  activities;Therapeutic exercise;Balance training;Neuromuscular re-education;Patient/family education;Manual techniques;Scar mobilization;Passive range of motion;Dry needling;Energy conservation;Taping   PT Next Visit Plan progress strengthening and ROM, progress HEP as tolerated   PT Home Exercise Plan Sit to stand without UE support, standing mini squats, standing hip abduction, standing slow marches, seated LAQ, supine/seated knee flexion and extension stretches   Consulted and Agree with Plan of Care Patient      Patient will benefit from skilled therapeutic intervention in order to improve the following deficits and impairments:  Abnormal gait, Decreased activity tolerance, Decreased balance, Decreased endurance, Decreased knowledge of precautions, Decreased knowledge of use of DME, Decreased mobility, Decreased range of motion, Decreased safety awareness, Decreased scar mobility, Decreased strength, Difficulty walking, Hypomobility, Increased edema, Increased fascial restricitons, Increased muscle spasms, Impaired perceived functional ability, Impaired flexibility, Postural dysfunction, Improper body mechanics, Pain  Visit Diagnosis: Acute pain of right knee  Unsteadiness on feet  Muscle weakness (generalized)     Problem List Patient Active Problem List   Diagnosis Date Noted  . Unilateral primary osteoarthritis, right knee 03/18/2016  . S/P total knee replacement using cement, right 03/18/2016  . Pre-operative cardiovascular examination 03/10/2016  . Routine general medical examination at a health care facility 10/24/2015  . 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 Osteopenia 08/23/2012  . Medicare annual wellness visit, subsequent 07/19/2012  . Screening for breast cancer 07/19/2012  . Postmenopausal estrogen deficiency 07/19/2012   . Hypertension 10/27/2011  . DUB (dysfunctional uterine bleeding)   . Rosacea    Alanson Puls, PT, DPT [place], [place] S 04/15/2016, 5:46 PM  [place] MAIN [facility] SERVICES [address] [place], [place], [address] Phone: [phone]   Fax:  [phone]  Name: Katherine Stanley MRN: [id number] Date of Birth: 1945-06-04

## 2016-04-17 ENCOUNTER — Ambulatory Visit: Payer: Medicare Other | Admitting: Physical Therapy

## 2016-04-17 ENCOUNTER — Encounter: Payer: Self-pay | Admitting: Physical Therapy

## 2016-04-17 DIAGNOSIS — M25561 Pain in right knee: Secondary | ICD-10-CM | POA: Diagnosis not present

## 2016-04-17 DIAGNOSIS — R2681 Unsteadiness on feet: Secondary | ICD-10-CM | POA: Diagnosis not present

## 2016-04-17 DIAGNOSIS — M6281 Muscle weakness (generalized): Secondary | ICD-10-CM | POA: Diagnosis not present

## 2016-04-17 NOTE — Therapy (Signed)
[place] MAIN [facility] SERVICES [address] [place], [place], [address] Phone: [phone]   Fax:  [phone]  Physical Therapy Treatment  Patient Details  Name: Katherine Stanley MRN: [id number] Date of Birth: [birth year] Referring Provider: Jonathon Resides  Encounter Date: 04/17/2016      PT End of Session - 04/17/16 1007    Visit Number 4   Number of Visits 17   Date for PT Re-Evaluation 13-Jun-2016   Authorization Type g codes 08/08/2022   PT Start Time 1000   PT Stop Time 1045   PT Time Calculation (min) 45 min   Equipment Utilized During Treatment Gait belt   Activity Tolerance Patient tolerated treatment well;Patient limited by pain  HR elevated to 124 with activity, down to 90s at rest.   Behavior During Therapy [facility] for tasks assessed/performed      Past Medical History:  Diagnosis Date  . Arthritis   . Diverticulosis   . Headache    occasionally  . Hypertension    takes Lisinopril daily  . Joint pain   . Osteopenia   . Rosacea     Past Surgical History:  Procedure Laterality Date  . CHOLECYSTECTOMY    . CHOLECYSTECTOMY    . COLONOSCOPY    . REFRACTIVE SURGERY    . SHOULDER ARTHROSCOPY WITH OPEN ROTATOR CUFF REPAIR AND DISTAL CLAVICLE ACROMINECTOMY Right 09/19/2014   Procedure: SHOULDER ARTHROSCOPY WITH MINI-OPEN ROTATOR CUFF REPAIR AND DISTAL CLAVICLE RESECTION, SUBACROMIAL DECOMPRESSION;  Surgeon: Garald Balding, MD;  Location: [place];  Service: Orthopedics;  Laterality: Right;  . TONSILLECTOMY  at age 71  . 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 TOTAL KNEE ARTHROPLASTY Right 03/18/2016   Procedure: TOTAL KNEE ARTHROPLASTY;  Surgeon: Garald Balding, MD;  Location: [place];  Service: Orthopedics;  Laterality: Right;  Marland Kitchen VAGINAL HYSTERECTOMY  at age 71    There were no vitals filed for this visit.      Subjective Assessment - 04/17/16 1005    Subjective Pt reports she is doing well on this date. She reports 2/10 pain  currently and reports some knee stiffness. She is performing HEP    Pertinent History Pt is a 71 y/o F s/p R TKA on 03/18/16. Pt was initially PWB 50% but pt saw her MD on 12/18 who told her she can update to WBAT.  Pt reports her last HHPT appointment was on 12/18.  Pt enjoys playing golf and would like to get back to doing this, the last time she played was the week before surgery.  HR reading 109-124 when pt initially arrived and was resting in seated position.  Had pt lay down and HR 112 manually and after a few minutes down to 94.  Pt admits that she has been very anxious about this evaluation.  At the end of the session her husband reports that the pt has been nervous about this appointment all day.  Pt did not bring in her HEP. Not on her medication list currently, pt is taking a low dose aspirin beginning this morning, she reports this was prescribed by her MD.    Limitations Sitting;Walking;Standing   How long can you sit comfortably? 1 hour   How long can you stand comfortably? 20-30 mins   How long can you walk comfortably? ~10 minutes   Patient Stated Goals get back to golfing, working out in the yard   Pain Score 2    Pain Location Knee  Pain Orientation Right   Pain Onset 1 to 4 weeks ago      THER-EX NuStep L1 x 5 minutes for warm-up; BOSU ball lunge x 10 x 2 Quad sets  2 x 10  RLE SLR   2 x 10; R knee quad set with very mild OP for extension stretch 5second hold, L sidelying R hip abduction 2 x 10; Seated RLE LAQ with manual resistance 2 x 10; Prone knee flex x 10 with 5 sec hold Knee flex PROM x 10 with 5 sec hold SLR with ER x 10  SLR with intervals 30 and 60 deg x 10  Min verbal cues used throughout .  Patient performs intermediate level exercises with pain behaviors and needs verbal cuing for correct technique.                           PT Education - 04/17/16 1006    Education provided Yes   Education Details taking pain medicine   Person(s)  Educated Patient   Methods Explanation   Comprehension Verbalized understanding             PT Long Term Goals - 04/02/16 1621      PT LONG TERM GOAL #1   Title Pt will improve LEFS to at least 64/80 to demonstrate decreased LE disability and improved LE functional ability    Baseline 55/80 on 12/20   Time 6   Period Weeks   Status New     PT LONG TERM GOAL #2   Title Pt will improve RLE MMT of hip, knee, ankle to 5/5 to demonstrate improved strength and function   Baseline see Evaulation note   Time 8   Period Weeks   Status New     PT LONG TERM GOAL #3   Title Pt will improve 42mWT to at least 1.8 m/s to improve gait speed to that of a community ambulator (without AD)   Baseline 0.73 m/s (without AD)   Time 6   Period Weeks   Status New     PT LONG TERM GOAL #4   Title Pt will improve R Knee flexion AROM to within 10 deg of L knee flexion AROM and will improve R knee extension AROM to 0 deg   Baseline R knee flexion AROM: 84 deg, R knee extension AROM: lacking 10 deg   Time 6   Period Weeks   Status New               Plan - 04/17/16 1007    Clinical Impression Statement Continuous verbal cues and tactile cues needed to perform exercises slowly and correctly. Progressed HEP with seated and supine exercises. Patient has some pain with exercises that goes away with a rest period.    Rehab Potential Good   Clinical Impairments Affecting Rehab Potential (-) severe anxiety about OPPT, +pt very motivated to return to PLOF   PT Frequency 2x / week   PT Duration 8 weeks   PT Treatment/Interventions ADLs/Self Care Home Management;Aquatic Therapy;Cryotherapy;Electrical Stimulation;Moist Heat;Iontophoresis 4mg /ml Dexamethasone;Ultrasound;DME Instruction;Gait training;Stair training;Functional mobility training;Therapeutic activities;Therapeutic exercise;Balance training;Neuromuscular re-education;Patient/family education;Manual techniques;Scar mobilization;Passive range  of motion;Dry needling;Energy conservation;Taping   PT Next Visit Plan progress strengthening and ROM, progress HEP as tolerated   PT Home Exercise Plan Sit to stand without UE support, standing mini squats, standing hip abduction, standing slow marches, seated LAQ, supine/seated knee flexion and extension stretches   Consulted and Agree with  Plan of Care Patient      Patient will benefit from skilled therapeutic intervention in order to improve the following deficits and impairments:  Abnormal gait, Decreased activity tolerance, Decreased balance, Decreased endurance, Decreased knowledge of precautions, Decreased knowledge of use of DME, Decreased mobility, Decreased range of motion, Decreased safety awareness, Decreased scar mobility, Decreased strength, Difficulty walking, Hypomobility, Increased edema, Increased fascial restricitons, Increased muscle spasms, Impaired perceived functional ability, Impaired flexibility, Postural dysfunction, Improper body mechanics, Pain  Visit Diagnosis: Acute pain of right knee  Unsteadiness on feet  Muscle weakness (generalized)     Problem List Patient Active Problem List   Diagnosis Date Noted  . Unilateral primary osteoarthritis, right knee 03/18/2016  . S/P total knee replacement using cement, right 03/18/2016  . Pre-operative cardiovascular examination 03/10/2016  . Routine general medical examination at a health care facility 10/24/2015  . 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 Osteopenia 08/23/2012  . Medicare annual wellness visit, subsequent 07/19/2012  . Screening for breast cancer 07/19/2012  . 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 Hypertension 10/27/2011  . DUB (dysfunctional uterine bleeding)   . Rosacea    Alanson Puls, PT, DPT [place], [place] 04/17/2016, 10:10 AM  [place] MAIN [facility] SERVICES [address] [place], [place], [address] Phone: [phone]   Fax:   [phone]  Name: Katherine Stanley MRN: [id number] Date of Birth: 10-21-1945

## 2016-04-18 ENCOUNTER — Ambulatory Visit (INDEPENDENT_AMBULATORY_CARE_PROVIDER_SITE_OTHER): Payer: Medicare Other | Admitting: Orthopaedic Surgery

## 2016-04-18 ENCOUNTER — Encounter (INDEPENDENT_AMBULATORY_CARE_PROVIDER_SITE_OTHER): Payer: Self-pay | Admitting: Orthopaedic Surgery

## 2016-04-18 VITALS — BP 129/81 | HR 104 | Ht 60.0 in | Wt 170.0 lb

## 2016-04-18 DIAGNOSIS — M1711 Unilateral primary osteoarthritis, right knee: Secondary | ICD-10-CM

## 2016-04-18 NOTE — Progress Notes (Signed)
Office Visit Note   Patient: Katherine Stanley           Date of Birth: 12/23/1945           MRN: [id number] Visit Date: 04/18/2016              Requested by: [name], MD [address] [place], [place] [address] PCP: [name], MD   Assessment & Plan: Visit Diagnoses: 1 month status post right total knee replacement doing very well.  Plan: Continue with home exercise program. Slowly wean from 4-point cane. Follow-up in 3 months  Follow-Up Instructions: No Follow-up on file.   Orders:  No orders of the defined types were placed in this encounter.  No orders of the defined types were placed in this encounter.     Procedures: No procedures performed   Clinical Data: No additional findings.   Subjective: No chief complaint on file.   4 weeks post op Right TKA on 03/18/16. Pt doing well, some swelling, no redness at scar  Pt very happy with her PT, 2 x week  Pt ambulates with a quad cane  No related fever or chills.  Review of Systems   Objective: Vital Signs: There were no vitals taken for this visit.  Physical Exam  Ortho Exam right knee with full extension. Flexion to 103 with a goniometer. Neurovascular exam intact distally. No distal edema. Calf pain. Incision has healed nicely without problem. No effusion.  Specialty Comments:  No specialty comments available.  Imaging: No results found.   PMFS History: Patient Active Problem List   Diagnosis Date Noted  . Unilateral primary osteoarthritis, right knee 03/18/2016  . S/P total knee replacement using cement, right 03/18/2016  . Pre-operative cardiovascular examination 03/10/2016  . Routine general medical examination at a health care facility 10/24/2015  . 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 Osteopenia 08/23/2012  . Medicare annual wellness visit, subsequent 07/19/2012  . Screening for breast cancer 07/19/2012  . 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  Hypertension 10/27/2011  . DUB (dysfunctional uterine bleeding)   . Rosacea    Past Medical History:  Diagnosis Date  . Arthritis   . Diverticulosis   . Headache    occasionally  . Hypertension    takes Lisinopril daily  . Joint pain   . Osteopenia   . Rosacea     Family History  Problem Relation Age of Onset  . Hypertension Mother   . Heart disease Mother   . Kidney disease Mother   . Diabetes Father   . Hypertension Father   . Heart disease Father   . Heart disease Brother   . Cancer Maternal Aunt   . Ovarian cancer Maternal Aunt 85  . Breast cancer Neg Hx     Past Surgical History:  Procedure Laterality Date  . CHOLECYSTECTOMY    . CHOLECYSTECTOMY    . COLONOSCOPY    . REFRACTIVE SURGERY    . SHOULDER ARTHROSCOPY WITH OPEN ROTATOR CUFF REPAIR AND DISTAL CLAVICLE ACROMINECTOMY Right 09/19/2014   Procedure: SHOULDER ARTHROSCOPY WITH MINI-OPEN ROTATOR CUFF REPAIR AND DISTAL CLAVICLE RESECTION, SUBACROMIAL DECOMPRESSION;  Surgeon: Garald Balding, MD;  Location: [facility];  Service: Orthopedics;  Laterality: Right;  . TONSILLECTOMY  at age 62  . 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 TOTAL KNEE ARTHROPLASTY Right 03/18/2016   Procedure: TOTAL KNEE ARTHROPLASTY;  Surgeon: Garald Balding, MD;  Location: [facility];  Service: Orthopedics;  Laterality: Right;  .  VAGINAL HYSTERECTOMY  at age 37   Social History   Occupational History  . Not on file.   Social History Main Topics  . Smoking status: Never Smoker  . Smokeless tobacco: Never Used  . Alcohol use No  . Drug use: No  . Sexual activity: Yes    Birth control/ protection: Post-menopausal, Surgical

## 2016-04-18 NOTE — Progress Notes (Deleted)
   Office Visit Note   Patient: Katherine Stanley           Date of Birth: [birth year]           MRN: [id number] Visit Date: 04/18/2016              Requested by: [name], MD [address] [place], [place] [address] PCP: [name], MD   Assessment & Plan: Visit Diagnoses: No diagnosis found.  Plan: ***  Follow-Up Instructions: No Follow-up on file.   Orders:  No orders of the defined types were placed in this encounter.  No orders of the defined types were placed in this encounter.     Procedures: No procedures performed   Clinical Data: No additional findings.   Subjective: No chief complaint on file.   HPI  Review of Systems   Objective: Vital Signs: There were no vitals taken for this visit.  Physical Exam  Ortho Exam  Specialty Comments:  No specialty comments available.  Imaging: No results found.   PMFS History: Patient Active Problem List   Diagnosis Date Noted  . Unilateral primary osteoarthritis, right knee 03/18/2016  . S/P total knee replacement using cement, right 03/18/2016  . Pre-operative cardiovascular examination 03/10/2016  . Routine general medical examination at a health care facility 10/24/2015  . 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 Osteopenia 08/23/2012  . Medicare annual wellness visit, subsequent 07/19/2012  . Screening for breast cancer 07/19/2012  . 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 Hypertension 10/27/2011  . DUB (dysfunctional uterine bleeding)   . Rosacea    Past Medical History:  Diagnosis Date  . Arthritis   . Diverticulosis   . Headache    occasionally  . Hypertension    takes Lisinopril daily  . Joint pain   . Osteopenia   . Rosacea     Family History  Problem Relation Age of Onset  . Hypertension Mother   . Heart disease Mother   . Kidney disease Mother   . Diabetes Father   . Hypertension Father   . Heart disease Father   . Heart disease Brother   .  Cancer Maternal Aunt   . Ovarian cancer Maternal Aunt 85  . Breast cancer Neg Hx     Past Surgical History:  Procedure Laterality Date  . CHOLECYSTECTOMY    . CHOLECYSTECTOMY    . COLONOSCOPY    . REFRACTIVE SURGERY    . SHOULDER ARTHROSCOPY WITH OPEN ROTATOR CUFF REPAIR AND DISTAL CLAVICLE ACROMINECTOMY Right 09/19/2014   Procedure: SHOULDER ARTHROSCOPY WITH MINI-OPEN ROTATOR CUFF REPAIR AND DISTAL CLAVICLE RESECTION, SUBACROMIAL DECOMPRESSION;  Surgeon: Garald Balding, MD;  Location: [facility];  Service: Orthopedics;  Laterality: Right;  . TONSILLECTOMY  at age 80  . 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 TOTAL KNEE ARTHROPLASTY Right 03/18/2016   Procedure: TOTAL KNEE ARTHROPLASTY;  Surgeon: Garald Balding, MD;  Location: [facility];  Service: Orthopedics;  Laterality: Right;  Marland Kitchen VAGINAL HYSTERECTOMY  at age 2   Social History   Occupational History  . Not on file.   Social History Main Topics  . Smoking status: Never Smoker  . Smokeless tobacco: Never Used  . Alcohol use No  . Drug use: No  . Sexual activity: Yes    Birth control/ protection: Post-menopausal, Surgical

## 2016-04-21 ENCOUNTER — Ambulatory Visit: Payer: Medicare Other | Admitting: Physical Therapy

## 2016-04-21 ENCOUNTER — Encounter: Payer: Self-pay | Admitting: Physical Therapy

## 2016-04-21 DIAGNOSIS — R2681 Unsteadiness on feet: Secondary | ICD-10-CM | POA: Diagnosis not present

## 2016-04-21 DIAGNOSIS — M6281 Muscle weakness (generalized): Secondary | ICD-10-CM

## 2016-04-21 DIAGNOSIS — M25561 Pain in right knee: Secondary | ICD-10-CM

## 2016-04-21 NOTE — Therapy (Addendum)
[place] MAIN [facility] SERVICES [address] [place], [place], [address] Phone: [phone]   Fax:  [phone]  Physical Therapy Treatment  Patient Details  Name: Katherine Stanley MRN: [id number] Date of Birth: 09-16-1945 Referring Provider: Jonathon Resides  Encounter Date: 04/21/2016      PT End of Session - 04/21/16 1551    Visit Number 5   Number of Visits 17   Date for PT Re-Evaluation 2016-06-01   Authorization Type g codes 5/10   PT Start Time 0345   PT Stop Time 0430   PT Time Calculation (min) 45 min   Equipment Utilized During Treatment Gait belt   Activity Tolerance Patient tolerated treatment well;Patient limited by pain  HR elevated to 124 with activity, down to 90s at rest.   Behavior During Therapy [facility] for tasks assessed/performed      Past Medical History:  Diagnosis Date  . Arthritis   . Diverticulosis   . Headache    occasionally  . Hypertension    takes Lisinopril daily  . Joint pain   . Osteopenia   . Rosacea     Past Surgical History:  Procedure Laterality Date  . CHOLECYSTECTOMY    . CHOLECYSTECTOMY    . COLONOSCOPY    . REFRACTIVE SURGERY    . SHOULDER ARTHROSCOPY WITH OPEN ROTATOR CUFF REPAIR AND DISTAL CLAVICLE ACROMINECTOMY Right 09/19/2014   Procedure: SHOULDER ARTHROSCOPY WITH MINI-OPEN ROTATOR CUFF REPAIR AND DISTAL CLAVICLE RESECTION, SUBACROMIAL DECOMPRESSION;  Surgeon: Garald Balding, MD;  Location: [place];  Service: Orthopedics;  Laterality: Right;  . TONSILLECTOMY  at age 9  . 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 TOTAL KNEE ARTHROPLASTY Right 03/18/2016   Procedure: TOTAL KNEE ARTHROPLASTY;  Surgeon: Garald Balding, MD;  Location: [place];  Service: Orthopedics;  Laterality: Right;  Marland Kitchen VAGINAL HYSTERECTOMY  at age 17    There were no vitals filed for this visit.      Subjective Assessment - 04/21/16 1552    Subjective Pt reports she is doing well on this date. She reports 1/10 pain  currently and reports some knee stiffness. She is performing HEP and states she has begun to walk on her elliptical at home.   Pertinent History Pt is a 71 y/o F s/p R TKA on 03/18/16. Pt was initially PWB 50% but pt saw her MD on 12/18 who told her she can update to WBAT.  Pt reports her last HHPT appointment was on 12/18.  Pt enjoys playing golf and would like to get back to doing this, the last time she played was the week before surgery.  HR reading 109-124 when pt initially arrived and was resting in seated position.  Had pt lay down and HR 112 manually and after a few minutes down to 94.  Pt admits that she has been very anxious about this evaluation.  At the end of the session her husband reports that the pt has been nervous about this appointment all day.  Pt did not bring in her HEP. Not on her medication list currently, pt is taking a low dose aspirin beginning this morning, she reports this was prescribed by her MD.    Limitations Sitting;Walking;Standing   How long can you sit comfortably? 1 hour   How long can you stand comfortably? 20-30 mins   How long can you walk comfortably? ~10 minutes   Patient Stated Goals get back to golfing, working out in the yard  Currently in Pain? Yes   Pain Score 1    Pain Location Knee   Pain Orientation Right   Pain Descriptors / Indicators Aching   Pain Onset 1 to 4 weeks ago   Multiple Pain Sites No     Therapeutic exercise: Nustep 5 minutes Passive knee flexion in sitting x10  Passive knee flexion in prone x10 Prone hip extensions x10 Supine knee extensions 2x10 with 3 lb weight Supine interval SLRs with foot in neutral x15 Supine interval SLRs with toe out x15 Sidelying right hip abduction x15 x2 Leg press 60 lbs x20 x3 Calf raises on leg press x20 x3 Single leg press on right leg x20 Single calf raises on leg press x20  Pt needed to be cued to go slower and to hold exercises  ROM: R knee flexion: 113 degrees Patient has no reports  of increased pain during or following therapy. Patient was going to use ice at home after treatment.    This entire session was performed under direct supervision and direction of a licensed therapist/therapist assistant . I have personally read, edited and approve of the note as written.                         PT Long Term Goals - 04/02/16 1621      PT LONG TERM GOAL #1   Title Pt will improve LEFS to at least 64/80 to demonstrate decreased LE disability and improved LE functional ability    Baseline 55/80 on 12/20   Time 6   Period Weeks   Status New     PT LONG TERM GOAL #2   Title Pt will improve RLE MMT of hip, knee, ankle to 5/5 to demonstrate improved strength and function   Baseline see Evaulation note   Time 8   Period Weeks   Status New     PT LONG TERM GOAL #3   Title Pt will improve 54mWT to at least 1.8 m/s to improve gait speed to that of a community ambulator (without AD)   Baseline 0.73 m/s (without AD)   Time 6   Period Weeks   Status New     PT LONG TERM GOAL #4   Title Pt will improve R Knee flexion AROM to within 10 deg of L knee flexion AROM and will improve R knee extension AROM to 0 deg   Baseline R knee flexion AROM: 84 deg, R knee extension AROM: lacking 10 deg   Time 6   Period Weeks   Status New             Patient will benefit from skilled therapeutic intervention in order to improve the following deficits and impairments:     Visit Diagnosis: Acute pain of right knee  Unsteadiness on feet  Muscle weakness (generalized)     Problem List Patient Active Problem List   Diagnosis Date Noted  . Unilateral primary osteoarthritis, right knee 03/18/2016  . S/P total knee replacement using cement, right 03/18/2016  . Pre-operative cardiovascular examination 03/10/2016  . Routine general medical examination at a health care facility 10/24/2015  . 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 Osteopenia 08/23/2012  . Medicare  annual wellness visit, subsequent 07/19/2012  . Screening for breast cancer 07/19/2012  . 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 Hypertension 10/27/2011  . DUB (dysfunctional uterine bleeding)   . Rosacea   This entire session was performed under direct supervision and direction of a  licensed Chiropractor . I have personally read, edited and approve of the note as written. Alanson Puls, PT, DPT [place], SPT [place]etsy Coder 04/21/2016, 4:36 PM  [place] MAIN [facility] SERVICES [address] [place], [place], [address] Phone: [phone]   Fax:  [phone]  Name: Katherine Stanley MRN: [id number] Date of Birth: March 28, 1946

## 2016-04-23 ENCOUNTER — Encounter: Payer: Self-pay | Admitting: Physical Therapy

## 2016-04-23 ENCOUNTER — Ambulatory Visit: Payer: Medicare Other | Admitting: Physical Therapy

## 2016-04-23 DIAGNOSIS — M6281 Muscle weakness (generalized): Secondary | ICD-10-CM

## 2016-04-23 DIAGNOSIS — R2681 Unsteadiness on feet: Secondary | ICD-10-CM

## 2016-04-23 DIAGNOSIS — M25561 Pain in right knee: Secondary | ICD-10-CM | POA: Diagnosis not present

## 2016-04-23 NOTE — Therapy (Signed)
[place] MAIN [facility] SERVICES [address] [place], [place], [address] Phone: [phone]   Fax:  [phone]  Physical Therapy Treatment  Patient Details  Name: Katherine Stanley MRN: [id number] Date of Birth: 19-Apr-1945 Referring Provider: Jonathon Resides  Encounter Date: 04/23/2016      PT End of Session - 04/23/16 0847    Visit Number 6   Number of Visits 17   Date for PT Re-Evaluation 08-Jun-2016   Authorization Type g codes 6/10   PT Start Time 0832   PT Stop Time 0915   PT Time Calculation (min) 43 min   Equipment Utilized During Treatment Gait belt   Activity Tolerance Patient tolerated treatment well;Patient limited by pain  HR elevated to 124 with activity, down to 90s at rest.   Behavior During Therapy [facility] for tasks assessed/performed      Past Medical History:  Diagnosis Date  . Arthritis   . Diverticulosis   . Headache    occasionally  . Hypertension    takes Lisinopril daily  . Joint pain   . Osteopenia   . Rosacea     Past Surgical History:  Procedure Laterality Date  . CHOLECYSTECTOMY    . CHOLECYSTECTOMY    . COLONOSCOPY    . REFRACTIVE SURGERY    . SHOULDER ARTHROSCOPY WITH OPEN ROTATOR CUFF REPAIR AND DISTAL CLAVICLE ACROMINECTOMY Right 09/19/2014   Procedure: SHOULDER ARTHROSCOPY WITH MINI-OPEN ROTATOR CUFF REPAIR AND DISTAL CLAVICLE RESECTION, SUBACROMIAL DECOMPRESSION;  Surgeon: Garald Balding, MD;  Location: [place];  Service: Orthopedics;  Laterality: Right;  . TONSILLECTOMY  at age 23  . 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 TOTAL KNEE ARTHROPLASTY Right 03/18/2016   Procedure: TOTAL KNEE ARTHROPLASTY;  Surgeon: Garald Balding, MD;  Location: [place];  Service: Orthopedics;  Laterality: Right;  Marland Kitchen VAGINAL HYSTERECTOMY  at age 2    There were no vitals filed for this visit.      Subjective Assessment - 04/23/16 0846    Subjective Pt reports she is doing well on this date. She reports 1/10 pain  currently and reports some knee stiffness. She is performing HEP and states she has took tylenol today before she arrived.   Pertinent History Pt is a 71 y/o F s/p R TKA on 03/18/16. Pt was initially PWB 50% but pt saw her MD on 12/18 who told her she can update to WBAT.  Pt reports her last HHPT appointment was on 12/18.  Pt enjoys playing golf and would like to get back to doing this, the last time she played was the week before surgery.  HR reading 109-124 when pt initially arrived and was resting in seated position.  Had pt lay down and HR 112 manually and after a few minutes down to 94.  Pt admits that she has been very anxious about this evaluation.  At the end of the session her husband reports that the pt has been nervous about this appointment all day.  Pt did not bring in her HEP. Not on her medication list currently, pt is taking a low dose aspirin beginning this morning, she reports this was prescribed by her MD.    Limitations Sitting;Walking;Standing   How long can you sit comfortably? 1 hour   How long can you stand comfortably? 20-30 mins   How long can you walk comfortably? ~10 minutes   Patient Stated Goals get back to golfing, working out in the yard   Pain  Onset 1 to 4 weeks ago      Therapeutic exercise: Nustep 5 minutes Passive knee flexion in sitting x10  Passive knee flexion in prone x10 Prone hip extensions x10 Supine knee extensions 2x10 with 3 lb weight Supine interval SLRs with foot in neutral x15 Supine interval SLRs with toe out x15 Sidelying right hip abduction x15 x2 Leg press 60 lbs x20 x3 Calf raises on leg press x20 x3 Single leg matrix extension/abduction/flex 7.5 lbs x 10 x 2 Single calf raises on leg press x20 Squats x 10 x 2  Pt needed to be cued to perform exercises slowly and in correct position.  ROM: R knee flexion: 114 degrees Patient has no reports of increased pain during or following therapy. Patient was going to use ice at home after  treatment.                            PT Education - 04/23/16 0847    Education provided Yes   Education Details educated about use of ice   Person(s) Educated Patient   Methods Explanation   Comprehension Verbalized understanding             PT Long Term Goals - 04/02/16 1621      PT LONG TERM GOAL #1   Title Pt will improve LEFS to at least 64/80 to demonstrate decreased LE disability and improved LE functional ability    Baseline 55/80 on 12/20   Time 6   Period Weeks   Status New     PT LONG TERM GOAL #2   Title Pt will improve RLE MMT of hip, knee, ankle to 5/5 to demonstrate improved strength and function   Baseline see Evaulation note   Time 8   Period Weeks   Status New     PT LONG TERM GOAL #3   Title Pt will improve 65mWT to at least 1.8 m/s to improve gait speed to that of a community ambulator (without AD)   Baseline 0.73 m/s (without AD)   Time 6   Period Weeks   Status New     PT LONG TERM GOAL #4   Title Pt will improve R Knee flexion AROM to within 10 deg of L knee flexion AROM and will improve R knee extension AROM to 0 deg   Baseline R knee flexion AROM: 84 deg, R knee extension AROM: lacking 10 deg   Time 6   Period Weeks   Status New               Plan - 04/23/16 0847    Clinical Impression Statement Patient reports no increased pain during or following exercises and is able to perform stretches and resisted exercises, Her ROM continues to improve. Patient will benefit from skilled PT to regain full ROM and strength to RLE.   Rehab Potential Good   Clinical Impairments Affecting Rehab Potential (-) severe anxiety about OPPT, +pt very motivated to return to PLOF   PT Frequency 2x / week   PT Duration 8 weeks   PT Treatment/Interventions ADLs/Self Care Home Management;Aquatic Therapy;Cryotherapy;Electrical Stimulation;Moist Heat;Iontophoresis 4mg /ml Dexamethasone;Ultrasound;DME Instruction;Gait training;Stair  training;Functional mobility training;Therapeutic activities;Therapeutic exercise;Balance training;Neuromuscular re-education;Patient/family education;Manual techniques;Scar mobilization;Passive range of motion;Dry needling;Energy conservation;Taping   PT Next Visit Plan progress strengthening and ROM, progress HEP as tolerated   PT Home Exercise Plan Sit to stand without UE support, standing mini squats, standing hip abduction, standing slow marches, seated  LAQ, supine/seated knee flexion and extension stretches   Consulted and Agree with Plan of Care Patient      Patient will benefit from skilled therapeutic intervention in order to improve the following deficits and impairments:  Decreased activity tolerance, Decreased balance, Decreased endurance, Decreased knowledge of precautions, Decreased knowledge of use of DME, Decreased mobility, Decreased range of motion, Decreased safety awareness, Decreased scar mobility, Decreased strength, Difficulty walking, Hypomobility, Increased edema, Increased fascial restricitons, Increased muscle spasms, Impaired perceived functional ability, Impaired flexibility, Postural dysfunction, Improper body mechanics, Pain  Visit Diagnosis: Acute pain of right knee  Unsteadiness on feet  Muscle weakness (generalized)     Problem List Patient Active Problem List   Diagnosis Date Noted  . Unilateral primary osteoarthritis, right knee 03/18/2016  . S/P total knee replacement using cement, right 03/18/2016  . Pre-operative cardiovascular examination 03/10/2016  . Routine general medical examination at a health care facility 10/24/2015  . 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 Osteopenia 08/23/2012  . Medicare annual wellness visit, subsequent 07/19/2012  . Screening for breast cancer 07/19/2012  . 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 Hypertension 10/27/2011  . DUB (dysfunctional uterine bleeding)   . Rosacea    Alanson Puls, PT,  DPT [place], Minette Headland S 04/23/2016, 9:06 AM  [place] MAIN [facility] SERVICES [address] [place], [place], [address] Phone: [phone]   Fax:  [phone]  Name: Katherine Stanley MRN: [id number] Date of Birth: 1945/11/07

## 2016-04-25 ENCOUNTER — Ambulatory Visit (INDEPENDENT_AMBULATORY_CARE_PROVIDER_SITE_OTHER): Payer: Medicare Other | Admitting: Family Medicine

## 2016-04-25 ENCOUNTER — Encounter: Payer: Self-pay | Admitting: Family Medicine

## 2016-04-25 DIAGNOSIS — N938 Other specified abnormal uterine and vaginal bleeding: Secondary | ICD-10-CM | POA: Diagnosis not present

## 2016-04-25 DIAGNOSIS — Z96651 Presence of right artificial knee joint: Secondary | ICD-10-CM

## 2016-04-25 DIAGNOSIS — I10 Essential (primary) hypertension: Secondary | ICD-10-CM | POA: Diagnosis not present

## 2016-04-25 NOTE — Assessment & Plan Note (Signed)
History of this in the past. Is status post hysterectomy when she was 56. Advised that if she develops any bleeding she should let us know.

## 2016-04-25 NOTE — Patient Instructions (Signed)
Nice to see you. Please continue your blood pressure medications and continue to monitor blood pressure at home. Please continue to physical therapy for the right knee.

## 2016-04-25 NOTE — Assessment & Plan Note (Signed)
Doing well status post knee replacement. She'll continue physical therapy. Continue to follow with orthopedics for this.

## 2016-04-25 NOTE — Progress Notes (Signed)
  [name], MD Phone: [phone]  Katherine Stanley is a 71 y.o. female who presents today for follow-up.  Hypertension: Typically runs between 120 and 130s over 70s. Taking her lisinopril. No chest pain, short of breath, or edema.  Status post right knee replacement: Has been doing well with this. Got back to driving a month after surgery. Her range of motion is up to 114. She does note some discomfort at times though this is significantly improved. Doing physical therapy twice a week. Some mild swelling and heat at times though these always resolve and she has no erythema. Notes her surgeon is quite happy with it.  History of dysfunctional uterine bleeding: When she was younger she had a history of heavy periods with bleeding every 2 weeks. Notes she had a hysterectomy at age 47. No bleeding since then.  PMH: nonsmoker.   ROS see history of present illness  Objective  Physical Exam Vitals:   04/25/16 0800 04/25/16 0813  BP: (!) 152/98 130/80  Pulse: 93   Temp: 98.1 F (36.7 C)     BP Readings from Last 3 Encounters:  04/25/16 130/80  04/18/16 129/81  04/10/16 (!) 143/86   Wt Readings from Last 3 Encounters:  04/25/16 165 lb (74.8 kg)  04/18/16 170 lb (77.1 kg)  03/31/16 162 lb (73.5 kg)    Physical Exam  Constitutional: No distress.  Cardiovascular: Normal rate, regular rhythm and normal heart sounds.   Pulmonary/Chest: Effort normal and breath sounds normal.  Musculoskeletal: She exhibits no edema.  Right knee with mild swelling, well-healed midline scar, no tenderness, minimal warmth, no erythema, left knee with no tenderness, swelling, warmth, or erythema, no malignant is laxity of the left knee  Neurological: She is alert.  Skin: Skin is warm and dry. She is not diaphoretic.     Assessment/Plan: Please see individual problem list.  S/P total knee replacement using cement, right Doing well status post knee replacement. She'll continue physical  therapy. Continue to follow with orthopedics for this.  DUB (dysfunctional uterine bleeding) History of this in the past. Is status post hysterectomy when she was 32. Advised that if she develops any bleeding she should let us know.  Hypertension At goal on recheck. Continue current medications.  [name], MD [place]

## 2016-04-25 NOTE — Assessment & Plan Note (Signed)
At goal on recheck. Continue current medications. 

## 2016-04-25 NOTE — Progress Notes (Signed)
Pre visit review using our clinic review tool, if applicable. No additional management support is needed unless otherwise documented below in the visit note. 

## 2016-04-28 ENCOUNTER — Ambulatory Visit: Payer: Medicare Other | Admitting: Physical Therapy

## 2016-04-28 ENCOUNTER — Encounter: Payer: Self-pay | Admitting: Physical Therapy

## 2016-04-28 DIAGNOSIS — M25561 Pain in right knee: Secondary | ICD-10-CM | POA: Diagnosis not present

## 2016-04-28 DIAGNOSIS — M6281 Muscle weakness (generalized): Secondary | ICD-10-CM

## 2016-04-28 DIAGNOSIS — R2681 Unsteadiness on feet: Secondary | ICD-10-CM

## 2016-04-28 NOTE — Therapy (Signed)
[place] MAIN [facility] SERVICES [address] [place], [place], [address] Phone: [phone]   Fax:  [phone]  Physical Therapy Treatment  Patient Details  Name: Katherine Stanley MRN: [id number] Date of Birth: 05/22/1945 Referring Provider: Jonathon Resides  Encounter Date: 04/28/2016      PT End of Session - 04/28/16 0840    Visit Number 7   Number of Visits 17   Date for PT Re-Evaluation 06-25-2016   Authorization Type g codes 7/10   PT Start Time 0830   PT Stop Time 0910   PT Time Calculation (min) 40 min   Equipment Utilized During Treatment Gait belt   Activity Tolerance Patient tolerated treatment well;Patient limited by pain  HR elevated to 124 with activity, down to 90s at rest.   Behavior During Therapy [facility] for tasks assessed/performed      Past Medical History:  Diagnosis Date  . Arthritis   . Diverticulosis   . Headache    occasionally  . Hypertension    takes Lisinopril daily  . Joint pain   . Osteopenia   . Rosacea     Past Surgical History:  Procedure Laterality Date  . CHOLECYSTECTOMY    . CHOLECYSTECTOMY    . COLONOSCOPY    . REFRACTIVE SURGERY    . SHOULDER ARTHROSCOPY WITH OPEN ROTATOR CUFF REPAIR AND DISTAL CLAVICLE ACROMINECTOMY Right 09/19/2014   Procedure: SHOULDER ARTHROSCOPY WITH MINI-OPEN ROTATOR CUFF REPAIR AND DISTAL CLAVICLE RESECTION, SUBACROMIAL DECOMPRESSION;  Surgeon: Garald Balding, MD;  Location: [place];  Service: Orthopedics;  Laterality: Right;  . TONSILLECTOMY  at age 82  . 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 TOTAL KNEE ARTHROPLASTY Right 03/18/2016   Procedure: TOTAL KNEE ARTHROPLASTY;  Surgeon: Garald Balding, MD;  Location: [place];  Service: Orthopedics;  Laterality: Right;  Marland Kitchen VAGINAL HYSTERECTOMY  at age 69    There were no vitals filed for this visit.      Subjective Assessment - 04/28/16 0837    Subjective Pt reports she is doing well on this date. She reports 1/10 pain  currently and reports some knee stiffness. She is performing HEP and states she has took tylenol today before she arrived.There are some times that it does not hurt at all. Patient reports that she is able to walk down the steps recriprocally.   Pertinent History Pt is a 71 y/o F s/p R TKA on 03/18/16. Pt was initially PWB 50% but pt saw her MD on 12/18 who told her she can update to WBAT.  Pt reports her last HHPT appointment was on 12/18.  Pt enjoys playing golf and would like to get back to doing this, the last time she played was the week before surgery.  HR reading 109-124 when pt initially arrived and was resting in seated position.  Had pt lay down and HR 112 manually and after a few minutes down to 94.  Pt admits that she has been very anxious about this evaluation.  At the end of the session her husband reports that the pt has been nervous about this appointment all day.  Pt did not bring in her HEP. Not on her medication list currently, pt is taking a low dose aspirin beginning this morning, she reports this was prescribed by her MD.    Limitations Sitting;Walking;Standing   How long can you sit comfortably? 1 hour   How long can you stand comfortably? 20-30 mins   How long can  you walk comfortably? ~10 minutes   Patient Stated Goals get back to golfing, working out in the yard   Pain Score 1    Pain Location Knee   Pain Orientation Right   Pain Descriptors / Indicators Aching   Pain Type Acute pain   Pain Onset 1 to 4 weeks ago   Pain Frequency Intermittent   Multiple Pain Sites No      Therapeutic exercise: Nustep 5 minutes Passive knee flexion in sitting x10  Passive knee flexion in prone with 3 sec hold x10 Prone hip extensions x10 Supine knee extensions 2x10 with 3 lb weight Supine interval SLRs with foot in neutral x15 Supine interval SLRs with toe out x15 Sidelying right hip abduction x15 x2 Leg press 70 lbs x20 x3 , 45 heel raises x 20 x 3  Calf raises on leg press x20  x3 Single leg matrix extension/abduction/flex 7.5 lbs x 10 x 2 Single calf raises on leg press x20 Squats x 10 x 2  Pt needed to be cued to perform exercises slowly and in correct position.  ROM: R knee flexion: 115 degrees Patient has no reports of increased pain during or following therapy. Patient was going to use ice at home after treatment.                            PT Education - 04/28/16 [id number]    Education provided Yes   Education Details elipticle machine and review of HEP   Person(s) Educated Patient   Methods Explanation   Comprehension Verbalized understanding             PT Long Term Goals - 04/02/16 1621      PT LONG TERM GOAL #1   Title Pt will improve LEFS to at least 64/80 to demonstrate decreased LE disability and improved LE functional ability    Baseline 55/80 on 12/20   Time 6   Period Weeks   Status New     PT LONG TERM GOAL #2   Title Pt will improve RLE MMT of hip, knee, ankle to 5/5 to demonstrate improved strength and function   Baseline see Evaulation note   Time 8   Period Weeks   Status New     PT LONG TERM GOAL #3   Title Pt will improve 30mWT to at least 1.8 m/s to improve gait speed to that of a community ambulator (without AD)   Baseline 0.73 m/s (without AD)   Time 6   Period Weeks   Status New     PT LONG TERM GOAL #4   Title Pt will improve R Knee flexion AROM to within 10 deg of L knee flexion AROM and will improve R knee extension AROM to 0 deg   Baseline R knee flexion AROM: 84 deg, R knee extension AROM: lacking 10 deg   Time 6   Period Weeks   Status New               Plan - 04/28/16 0841    Clinical Impression Statement Patient is able to perform PROM and stretching to right knee and perform strengthening to RLE to improve gait and mobility. She will continue to benefit from skilled PT to improve gait speed and mobility.    Rehab Potential Good   Clinical Impairments Affecting Rehab  Potential (-) severe anxiety about OPPT, +pt very motivated to return to PLOF   PT Frequency  2x / week   PT Duration 8 weeks   PT Treatment/Interventions ADLs/Self Care Home Management;Aquatic Therapy;Cryotherapy;Electrical Stimulation;Moist Heat;Iontophoresis 4mg /ml Dexamethasone;Ultrasound;DME Instruction;Gait training;Stair training;Functional mobility training;Therapeutic activities;Therapeutic exercise;Balance training;Neuromuscular re-education;Patient/family education;Manual techniques;Scar mobilization;Passive range of motion;Dry needling;Energy conservation;Taping   PT Next Visit Plan progress strengthening and ROM, progress HEP as tolerated   PT Home Exercise Plan Sit to stand without UE support, standing mini squats, standing hip abduction, standing slow marches, seated LAQ, supine/seated knee flexion and extension stretches   Consulted and Agree with Plan of Care Patient      Patient will benefit from skilled therapeutic intervention in order to improve the following deficits and impairments:  Decreased activity tolerance, Decreased balance, Decreased endurance, Decreased knowledge of precautions, Decreased knowledge of use of DME, Decreased mobility, Decreased range of motion, Decreased safety awareness, Decreased scar mobility, Decreased strength, Difficulty walking, Hypomobility, Increased edema, Increased fascial restricitons, Increased muscle spasms, Impaired perceived functional ability, Impaired flexibility, Postural dysfunction, Improper body mechanics, Pain  Visit Diagnosis: Acute pain of right knee  Unsteadiness on feet  Muscle weakness (generalized)     Problem List Patient Active Problem List   Diagnosis Date Noted  . Unilateral primary osteoarthritis, right knee 03/18/2016  . S/P total knee replacement using cement, right 03/18/2016  . Pre-operative cardiovascular examination 03/10/2016  . Routine general medical examination at a health care facility 10/24/2015   . 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 Osteopenia 08/23/2012  . Medicare annual wellness visit, subsequent 07/19/2012  . Screening for breast cancer 07/19/2012  . 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 Hypertension 10/27/2011  . DUB (dysfunctional uterine bleeding)   . Rosacea    Alanson Puls, PT, DPT [place], [place] S 04/28/2016, 8:45 AM  [place] MAIN [facility] SERVICES [address] [place], [place], [address] Phone: [phone]   Fax:  [phone]  Name: RYIAN LADAGE MRN: [id number] Date of Birth: [birth year]

## 2016-04-29 ENCOUNTER — Encounter: Payer: Self-pay | Admitting: Physical Therapy

## 2016-04-29 ENCOUNTER — Ambulatory Visit: Payer: Medicare Other | Admitting: Physical Therapy

## 2016-04-29 DIAGNOSIS — M6281 Muscle weakness (generalized): Secondary | ICD-10-CM

## 2016-04-29 DIAGNOSIS — M25561 Pain in right knee: Secondary | ICD-10-CM

## 2016-04-29 DIAGNOSIS — R2681 Unsteadiness on feet: Secondary | ICD-10-CM | POA: Diagnosis not present

## 2016-04-29 NOTE — Therapy (Signed)
[place] MAIN [facility] SERVICES [address] [place], [place], [address] Phone: [phone]   Fax:  [phone]  Physical Therapy Treatment  Patient Details  Name: Katherine Stanley MRN: [id number] Date of Birth: 13-Apr-1946 Referring Provider: Jonathon Resides  Encounter Date: 04/29/2016      PT End of Session - 04/29/16 1619    Visit Number 8   Number of Visits 17   Date for PT Re-Evaluation 2016-05-29   Authorization Type g codes 8/10   PT Start Time 0400   PT Stop Time 0445   PT Time Calculation (min) 45 min   Activity Tolerance Patient tolerated treatment well;Patient limited by pain   Behavior During Therapy [facility] for tasks assessed/performed      Past Medical History:  Diagnosis Date  . Arthritis   . Diverticulosis   . Headache    occasionally  . Hypertension    takes Lisinopril daily  . Joint pain   . Osteopenia   . Rosacea     Past Surgical History:  Procedure Laterality Date  . CHOLECYSTECTOMY    . CHOLECYSTECTOMY    . COLONOSCOPY    . REFRACTIVE SURGERY    . SHOULDER ARTHROSCOPY WITH OPEN ROTATOR CUFF REPAIR AND DISTAL CLAVICLE ACROMINECTOMY Right 09/19/2014   Procedure: SHOULDER ARTHROSCOPY WITH MINI-OPEN ROTATOR CUFF REPAIR AND DISTAL CLAVICLE RESECTION, SUBACROMIAL DECOMPRESSION;  Surgeon: Garald Balding, MD;  Location: [place];  Service: Orthopedics;  Laterality: Right;  . TONSILLECTOMY  at age 71  . 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 TOTAL KNEE ARTHROPLASTY Right 03/18/2016   Procedure: TOTAL KNEE ARTHROPLASTY;  Surgeon: Garald Balding, MD;  Location: [place];  Service: Orthopedics;  Laterality: Right;  Marland Kitchen VAGINAL HYSTERECTOMY  at age 71    There were no vitals filed for this visit.      Subjective Assessment - 04/29/16 1611    Subjective Pt reports she is doing well on this date. She reports 1/10 pain currently and reports the stiffness is better today. Pt states she was able to do laundry this morning  which is going up and down the steps a lot. She is performing HEP and was able to go to sleep last night without pain medicine. There are some times that it does not hurt at all.    Pertinent History Pt is a 71 y/o F s/p R TKA on 03/18/16. Pt was initially PWB 50% but pt saw her MD on 12/18 who told her she can update to WBAT.  Pt reports her last HHPT appointment was on 12/18.  Pt enjoys playing golf and would like to get back to doing this, the last time she played was the week before surgery.  HR reading 109-124 when pt initially arrived and was resting in seated position.  Had pt lay down and HR 112 manually and after a few minutes down to 94.  Pt admits that she has been very anxious about this evaluation.  At the end of the session her husband reports that the pt has been nervous about this appointment all day.  Pt did not bring in her HEP. Not on her medication list currently, pt is taking a low dose aspirin beginning this morning, she reports this was prescribed by her MD.    Limitations Sitting;Walking;Standing   How long can you sit comfortably? 1 hour   How long can you stand comfortably? 20-30 mins   How long can you walk comfortably? ~10 minutes  Patient Stated Goals get back to golfing, working out in the yard   Currently in Pain? Yes   Pain Score 1    Pain Location Knee   Pain Orientation Right   Pain Descriptors / Indicators Aching   Pain Type Acute pain   Pain Onset 1 to 4 weeks ago   Multiple Pain Sites No      Therapeutic exercise: NuStep 5 minutes Passive knee flexion in sitting x10  Passive knee flexion in prone with 3 sec hold x10 Prone hip extensions x10 Supine knee extensions 2x10 with 3 lb weight Supine interval SLRs with foot in neutral x15 Supine interval SLRs with toe out x15 Sidelying right hip abduction x15 x2 Leg press 90 lbs x20 x3 , 45 lb heel raises x 20 x 3  Calf raises on leg press x20 x3 Hamstring stretches on stairs 30s x5   ROM:  Knee flexion:  119 degrees  Patient tolerated exercise well and needed minimal cueing to perform exercises correctly.                      PT Education - 04/29/16 1618    Education provided Yes   Education Details importance of continuing HEP and showed pt stretches at bottom of stairs   Person(s) Educated Patient   Methods Explanation   Comprehension Verbalized understanding             PT Long Term Goals - 04/02/16 1621      PT LONG TERM GOAL #1   Title Pt will improve LEFS to at least 64/80 to demonstrate decreased LE disability and improved LE functional ability    Baseline 55/80 on 12/20   Time 6   Period Weeks   Status New     PT LONG TERM GOAL #2   Title Pt will improve RLE MMT of hip, knee, ankle to 5/5 to demonstrate improved strength and function   Baseline see Evaulation note   Time 8   Period Weeks   Status New     PT LONG TERM GOAL #3   Title Pt will improve 73mWT to at least 1.8 m/s to improve gait speed to that of a community ambulator (without AD)   Baseline 0.73 m/s (without AD)   Time 6   Period Weeks   Status New     PT LONG TERM GOAL #4   Title Pt will improve R Knee flexion AROM to within 10 deg of L knee flexion AROM and will improve R knee extension AROM to 0 deg   Baseline R knee flexion AROM: 84 deg, R knee extension AROM: lacking 10 deg   Time 6   Period Weeks   Status New               Plan - 04/28/16 0841    Clinical Impression Statement Patient is able to perform PROM and stretching to right knee and perform strengthening to RLE to improve gait and mobility. She will continue to benefit from skilled PT to improve gait speed and mobility.    Rehab Potential Good   Clinical Impairments Affecting Rehab Potential (-) severe anxiety about OPPT, +pt very motivated to return to PLOF   PT Frequency 2x / week   PT Duration 8 weeks   PT Treatment/Interventions ADLs/Self Care Home Management;Aquatic Therapy;Cryotherapy;Electrical  Stimulation;Moist Heat;Iontophoresis 4mg /ml Dexamethasone;Ultrasound;DME Instruction;Gait training;Stair training;Functional mobility training;Therapeutic activities;Therapeutic exercise;Balance training;Neuromuscular re-education;Patient/family education;Manual techniques;Scar mobilization;Passive range of motion;Dry needling;Energy conservation;Taping   PT  Next Visit Plan progress strengthening and ROM, progress HEP as tolerated   PT Home Exercise Plan Sit to stand without UE support, standing mini squats, standing hip abduction, standing slow marches, seated LAQ, supine/seated knee flexion and extension stretches   Consulted and Agree with Plan of Care Patient      Patient will benefit from skilled therapeutic intervention in order to improve the following deficits and impairments:     Visit Diagnosis: Acute pain of right knee  Muscle weakness (generalized)     Problem List Patient Active Problem List   Diagnosis Date Noted  . Unilateral primary osteoarthritis, right knee 03/18/2016  . S/P total knee replacement using cement, right 03/18/2016  . Pre-operative cardiovascular examination 03/10/2016  . Routine general medical examination at a health care facility 10/24/2015  . 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 Osteopenia 08/23/2012  . Medicare annual wellness visit, subsequent 07/19/2012  . Screening for breast cancer 07/19/2012  . 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 Hypertension 10/27/2011  . DUB (dysfunctional uterine bleeding)   . Rosacea   Alanson Puls, PT, DPT This entire session was performed under direct supervision and direction of a licensed therapist/therapist assistant . I have personally read, edited and approve of the note as written. Betsy Coder, SPT Betsy Coder 04/29/2016, 4:39 PM  [place] MAIN [facility] SERVICES [address] [place], [place], [address] Phone: [phone]   Fax:  [phone]  Name:  Katherine Stanley MRN: [id number] Date of Birth: 25-Jul-1945

## 2016-04-30 ENCOUNTER — Encounter: Payer: Medicare Other | Admitting: Physical Therapy

## 2016-05-05 ENCOUNTER — Other Ambulatory Visit: Payer: Self-pay | Admitting: Cardiology

## 2016-05-05 ENCOUNTER — Encounter: Payer: Self-pay | Admitting: Physical Therapy

## 2016-05-05 ENCOUNTER — Ambulatory Visit: Payer: Medicare Other | Admitting: Physical Therapy

## 2016-05-05 DIAGNOSIS — R2681 Unsteadiness on feet: Secondary | ICD-10-CM | POA: Diagnosis not present

## 2016-05-05 DIAGNOSIS — M25561 Pain in right knee: Secondary | ICD-10-CM | POA: Diagnosis not present

## 2016-05-05 DIAGNOSIS — M6281 Muscle weakness (generalized): Secondary | ICD-10-CM | POA: Diagnosis not present

## 2016-05-05 DIAGNOSIS — R9431 Abnormal electrocardiogram [ECG] [EKG]: Secondary | ICD-10-CM

## 2016-05-05 NOTE — Therapy (Signed)
[place] MAIN [facility] SERVICES [address] [place], [place], [address] Phone: [phone]   Fax:  [phone]  Physical Therapy Treatment  Patient Details  Name: Katherine Stanley MRN: [id number] Date of Birth: 05-Apr-1946 Referring Provider: Jonathon Resides  Encounter Date: 05/05/2016      PT End of Session - 05/05/16 0847    Visit Number 9   Number of Visits 17   Date for PT Re-Evaluation 06/04/2016   Authorization Type g codes 12/30/22   PT Start Time 0835   PT Stop Time 0915   PT Time Calculation (min) 40 min   Activity Tolerance Patient tolerated treatment well   Behavior During Therapy [facility] for tasks assessed/performed      Past Medical History:  Diagnosis Date  . Arthritis   . Diverticulosis   . Headache    occasionally  . Hypertension    takes Lisinopril daily  . Joint pain   . Osteopenia   . Rosacea     Past Surgical History:  Procedure Laterality Date  . CHOLECYSTECTOMY    . CHOLECYSTECTOMY    . COLONOSCOPY    . REFRACTIVE SURGERY    . SHOULDER ARTHROSCOPY WITH OPEN ROTATOR CUFF REPAIR AND DISTAL CLAVICLE ACROMINECTOMY Right 09/19/2014   Procedure: SHOULDER ARTHROSCOPY WITH MINI-OPEN ROTATOR CUFF REPAIR AND DISTAL CLAVICLE RESECTION, SUBACROMIAL DECOMPRESSION;  Surgeon: Garald Balding, MD;  Location: [place];  Service: Orthopedics;  Laterality: Right;  . TONSILLECTOMY  at age 89  . 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 TOTAL KNEE ARTHROPLASTY Right 03/18/2016   Procedure: TOTAL KNEE ARTHROPLASTY;  Surgeon: Garald Balding, MD;  Location: [place];  Service: Orthopedics;  Laterality: Right;  Marland Kitchen VAGINAL HYSTERECTOMY  at age 40    There were no vitals filed for this visit.      Subjective Assessment - 05/05/16 0843    Subjective Pt reports she is doing well on this date and denies pain. She reports the stiffness is getting better and her knee is "starting to feel like a knee." She is getting on the elliptical at home  for 3 minutes at a time and wants to go up to 4 minutes this week. She was able to sleep in her own bed for the first time this weekend rather than the couch, and has not taken any pain medications this morning.   Pertinent History Pt is a 71 y/o F s/p R TKA on 03/18/16. Pt was initially PWB 50% but pt saw her MD on 12/18 who told her she can update to WBAT.  Pt reports her last HHPT appointment was on 12/18.  Pt enjoys playing golf and would like to get back to doing this, the last time she played was the week before surgery.  HR reading 109-124 when pt initially arrived and was resting in seated position.  Had pt lay down and HR 112 manually and after a few minutes down to 94.  Pt admits that she has been very anxious about this evaluation.  At the end of the session her husband reports that the pt has been nervous about this appointment all day.  Pt did not bring in her HEP. Not on her medication list currently, pt is taking a low dose aspirin beginning this morning, she reports this was prescribed by her MD.    Limitations Sitting;Walking;Standing   How long can you sit comfortably? 1 hour   How long can you stand comfortably? 20-30 mins  How long can you walk comfortably? ~10 minutes   Patient Stated Goals get back to golfing, working out in the yard   Currently in Pain? No/denies   Pain Score 0-No pain   Pain Onset 1 to 4 weeks ago   Multiple Pain Sites No      Therapeutic exercise: NuStep 5 minutes Passive knee flexion in sitting x10  Supine interval SLRs with foot in neutral x15 Supine interval SLRs with toe out x15 Supine knee extensions 10x2 with 3 lb weight Sidelying right hip abduction 15x2 Sidelying bent leg right hip abduction 15x2 Passive knee flexion in prone with 3 sec holdx10 Leg press 100lbs 20 x3, 100 lb heel raises 20 x 3  Hamstring stretches on stairs 30s x5  ROM: Knee flexion: 120 degrees   Patient tolerated exercises well and needed minimal cueing to maintain  correct position.Patient reports no pain prior to following therapy.          PT Education - 05/05/16 0846    Education provided Yes   Education Details importance of continuing HEP and stair stretches; added standing heel raises   Person(s) Educated Patient   Methods Explanation   Comprehension Verbalized understanding             PT Long Term Goals - 04/02/16 1621      PT LONG TERM GOAL #1   Title Pt will improve LEFS to at least 64/80 to demonstrate decreased LE disability and improved LE functional ability    Baseline 55/80 on 12/20   Time 6   Period Weeks   Status New     PT LONG TERM GOAL #2   Title Pt will improve RLE MMT of hip, knee, ankle to 5/5 to demonstrate improved strength and function   Baseline see Evaulation note   Time 8   Period Weeks   Status New     PT LONG TERM GOAL #3   Title Pt will improve 45mWT to at least 1.8 m/s to improve gait speed to that of a community ambulator (without AD)   Baseline 0.73 m/s (without AD)   Time 6   Period Weeks   Status New     PT LONG TERM GOAL #4   Title Pt will improve R Knee flexion AROM to within 10 deg of L knee flexion AROM and will improve R knee extension AROM to 0 deg   Baseline R knee flexion AROM: 84 deg, R knee extension AROM: lacking 10 deg   Time 6   Period Weeks   Status New             Plan - 05/05/16 0907    Clinical Impression Statement Pt is able to perform ROM, strengthening, and stretching to RLE, and continues to improve performance. Her function at home is continuing to increase and she is decreasing the amount of medication she is taking to manage pain. Patient will continue to benefit from skilled therapy to improve function and mobility.   Rehab Potential Good   Clinical Impairments Affecting Rehab Potential (-) severe anxiety about OPPT, +pt very motivated to return to PLOF   PT Frequency 2x / week   PT Duration 8 weeks   PT Treatment/Interventions ADLs/Self Care Home  Management;Aquatic Therapy;Cryotherapy;Electrical Stimulation;Moist Heat;Iontophoresis 4mg /ml Dexamethasone;Ultrasound;DME Instruction;Gait training;Stair training;Functional mobility training;Therapeutic activities;Therapeutic exercise;Balance training;Neuromuscular re-education;Patient/family education;Manual techniques;Scar mobilization;Passive range of motion;Dry needling;Energy conservation;Taping   PT Next Visit Plan progress strengthening and ROM, progress HEP as tolerated   PT Home Exercise  Plan Sit to stand without UE support, standing mini squats, standing hip abduction, standing slow marches, seated LAQ, supine/seated knee flexion and extension stretches   Consulted and Agree with Plan of Care Patient      Patient will benefit from skilled therapeutic intervention in order to improve the following deficits and impairments:  Decreased activity tolerance, Decreased balance, Decreased endurance, Decreased knowledge of precautions, Decreased knowledge of use of DME, Decreased mobility, Decreased range of motion, Decreased safety awareness, Decreased scar mobility, Decreased strength, Difficulty walking, Hypomobility, Increased edema, Increased fascial restricitons, Increased muscle spasms, Impaired perceived functional ability, Impaired flexibility, Postural dysfunction, Improper body mechanics, Pain  Visit Diagnosis: Acute pain of right knee  Muscle weakness (generalized)     Problem List Patient Active Problem List   Diagnosis Date Noted  . Unilateral primary osteoarthritis, right knee 03/18/2016  . S/P total knee replacement using cement, right 03/18/2016  . Pre-operative cardiovascular examination 03/10/2016  . Routine general medical examination at a health care facility 10/24/2015  . 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 Osteopenia 08/23/2012  . Medicare annual wellness visit, subsequent 07/19/2012  . Screening for breast cancer 07/19/2012  . Postmenopausal estrogen deficiency  07/19/2012  . Hypertension 10/27/2011  . DUB (dysfunctional uterine bleeding)   . Rosacea   Alanson Puls, PT, DPT This entire session was performed under direct supervision and direction of a licensed therapist/therapist assistant . I have personally read, edited and approve of the note as written. Betsy Coder, SPT [place], [place] S 05/05/2016, 1:43 PM  [place] MAIN [facility] SERVICES [address] [place], [place], [address] Phone: [phone]   Fax:  [phone]  Name: Katherine Stanley MRN: [id number] Date of Birth: Dec 16, 1945

## 2016-05-07 ENCOUNTER — Encounter: Payer: Self-pay | Admitting: Physical Therapy

## 2016-05-07 ENCOUNTER — Ambulatory Visit: Payer: Medicare Other | Admitting: Physical Therapy

## 2016-05-07 DIAGNOSIS — R2681 Unsteadiness on feet: Secondary | ICD-10-CM | POA: Diagnosis not present

## 2016-05-07 DIAGNOSIS — M25561 Pain in right knee: Secondary | ICD-10-CM

## 2016-05-07 DIAGNOSIS — M6281 Muscle weakness (generalized): Secondary | ICD-10-CM

## 2016-05-07 NOTE — Therapy (Signed)
[place] MAIN [facility] SERVICES [address] [place], [place], [address] Phone: [phone]   Fax:  [phone]  Physical Therapy Treatment/DC Summary  Patient Details  Name: Katherine Stanley MRN: [id number] Date of Birth: 1946-01-23 Referring Provider: Jonathon Resides  Encounter Date: 05/07/2016      PT End of Session - 05/07/16 0843    Visit Number 10   Number of Visits 17   Date for PT Re-Evaluation June 20, 2016   Authorization Type g codes 10/10   PT Start Time 0828   PT Stop Time 0910   PT Time Calculation (min) 42 min   Activity Tolerance Patient tolerated treatment well;No increased pain   Behavior During Therapy WFL for tasks assessed/performed      Past Medical History:  Diagnosis Date  . Arthritis   . Diverticulosis   . Headache    occasionally  . Hypertension    takes Lisinopril daily  . Joint pain   . Osteopenia   . Rosacea     Past Surgical History:  Procedure Laterality Date  . CHOLECYSTECTOMY    . CHOLECYSTECTOMY    . COLONOSCOPY    . REFRACTIVE SURGERY    . SHOULDER ARTHROSCOPY WITH OPEN ROTATOR CUFF REPAIR AND DISTAL CLAVICLE ACROMINECTOMY Right 09/19/2014   Procedure: SHOULDER ARTHROSCOPY WITH MINI-OPEN ROTATOR CUFF REPAIR AND DISTAL CLAVICLE RESECTION, SUBACROMIAL DECOMPRESSION;  Surgeon: Garald Balding, MD;  Location: [place];  Service: Orthopedics;  Laterality: Right;  . TONSILLECTOMY  at age 49  . 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 TOTAL KNEE ARTHROPLASTY Right 03/18/2016   Procedure: TOTAL KNEE ARTHROPLASTY;  Surgeon: Garald Balding, MD;  Location: [place];  Service: Orthopedics;  Laterality: Right;  Marland Kitchen VAGINAL HYSTERECTOMY  at age 52    There were no vitals filed for this visit.      Subjective Assessment - 05/07/16 0842    Subjective Patient says she went out and chipped and putt practice. She is not having any pain , a little stiff. She is doing her HEP.    Pertinent History Pt is a 71 y/o F  s/p R TKA on 03/18/16. Pt was initially PWB 50% but pt saw her MD on 12/18 who told her she can update to WBAT.  Pt reports her last HHPT appointment was on 12/18.  Pt enjoys playing golf and would like to get back to doing this, the last time she played was the week before surgery.  HR reading 109-124 when pt initially arrived and was resting in seated position.  Had pt lay down and HR 112 manually and after a few minutes down to 94.  Pt admits that she has been very anxious about this evaluation.  At the end of the session her husband reports that the pt has been nervous about this appointment all day.  Pt did not bring in her HEP. Not on her medication list currently, pt is taking a low dose aspirin beginning this morning, she reports this was prescribed by her MD.    Limitations Sitting;Walking;Standing   How long can you sit comfortably? 1 hour   How long can you stand comfortably? 20-30 mins   How long can you walk comfortably? ~10 minutes   Patient Stated Goals get back to golfing, working out in the yard   Currently in Pain? No/denies   Pain Score 0-No pain   Pain Location Knee   Pain Orientation Right   Pain Type Acute pain  Pain Onset 1 to 4 weeks ago   Multiple Pain Sites No      NuStep 5 minutes Passive knee flexion in sitting x10  Supine interval SLRs with foot in neutral x15 Supine interval SLRs with toe out x15 Supine knee extensions 10x2 with 3 lb weight Sidelying right hip abduction 15x2 Sidelying bent leg right hip abduction 15x2 Passive knee flexion in prone with 3 sec holdx10 Leg press 100lbs 20 x3, 100 lb heel raises 20 x 3  Hamstring stretches on stairs 30s x5  Outcome measures : 10 MW 1.26 m/sec LEFS 55/80   ROM: Right Knee flexion: 0-125 degrees  Patient tolerated exercises well and needed minimal cueing to maintain correct position.Patient reports no pain prior to following therapy.                             PT Education -  05/07/16 0843    Education provided Yes   Education Details HEP   Person(s) Educated Patient   Methods Explanation   Comprehension Verbalized understanding             PT Long Term Goals - 05/07/16 0926      PT LONG TERM GOAL #1   Title Pt will improve LEFS to at least 64/80 to demonstrate decreased LE disability and improved LE functional ability    Baseline 55/80 05/07/16   Time 6   Period Weeks   Status On-going     PT LONG TERM GOAL #2   Title Pt will improve RLE MMT of hip, knee, ankle to 5/5 to demonstrate improved strength and function   Time 8   Status Partially Met     PT LONG TERM GOAL #3   Title Pt will improve 29mT to at least 1.8 m/s to improve gait speed to that of a community ambulator (without AD)   Baseline 1.26 m/sec   Time 6   Period Weeks   Status Partially Met     PT LONG TERM GOAL #4   Title Pt will improve R Knee flexion AROM to within 10 deg of L knee flexion AROM and will improve R knee extension AROM to 0 deg   Baseline 0-125 right knee   Time 6   Period Weeks   Status Achieved               Plan - 05/07/16 00981   Clinical Impression Statement Patient is able to perform PROM stretching for right knee with increased ROM 0-125 deg. She is progressing with her exercises without any pain behaviors. Discussed DC today due to reaching goals and improved ROM and no pain in right knee and better gait speed.    Rehab Potential Good   Clinical Impairments Affecting Rehab Potential (-) severe anxiety about OPPT, +pt very motivated to return to PLOF   PT Frequency 2x / week   PT Duration 8 weeks   PT Treatment/Interventions ADLs/Self Care Home Management;Aquatic Therapy;Cryotherapy;Electrical Stimulation;Moist Heat;Iontophoresis 463mml Dexamethasone;Ultrasound;DME Instruction;Gait training;Stair training;Functional mobility training;Therapeutic activities;Therapeutic exercise;Balance training;Neuromuscular re-education;Patient/family  education;Manual techniques;Scar mobilization;Passive range of motion;Dry needling;Energy conservation;Taping   PT Next Visit Plan progress strengthening and ROM, progress HEP as tolerated   PT Home Exercise Plan Sit to stand without UE support, standing mini squats, standing hip abduction, standing slow marches, seated LAQ, supine/seated knee flexion and extension stretches   Consulted and Agree with Plan of Care Patient      Patient will benefit  from skilled therapeutic intervention in order to improve the following deficits and impairments:  Decreased activity tolerance, Decreased balance, Decreased endurance, Decreased knowledge of precautions, Decreased knowledge of use of DME, Decreased mobility, Decreased range of motion, Decreased safety awareness, Decreased scar mobility, Decreased strength, Difficulty walking, Hypomobility, Increased edema, Increased fascial restricitons, Increased muscle spasms, Impaired perceived functional ability, Impaired flexibility, Postural dysfunction, Improper body mechanics, Pain  Visit Diagnosis: Acute pain of right knee  Muscle weakness (generalized)  Unsteadiness on feet       G-Codes - 06/05/16 0927    Functional Assessment Tool Used 5xSTS, 34mT, MMT, ROM, LEFS, Clinical Judgement   Functional Limitation Mobility: Walking and moving around   Mobility: Walking and Moving Around Current Status [phone] At least 20 percent but less than 40 percent impaired, limited or restricted   Mobility: Walking and Moving Around Goal Status [phone] At least 1 percent but less than 20 percent impaired, limited or restricted   Mobility: Walking and Moving Around Discharge Status [phone] At least 1 percent but less than 20 percent impaired, limited or restricted      Problem List Patient Active Problem List   Diagnosis Date Noted  . Unilateral primary osteoarthritis, right knee 03/18/2016  . S/P total knee replacement using cement, right 03/18/2016  .  Pre-operative cardiovascular examination 03/10/2016  . Routine general medical examination at a health care facility 10/24/2015  . 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 Osteopenia 08/23/2012  . Medicare annual wellness visit, subsequent 07/19/2012  . Screening for breast cancer 07/19/2012  . 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 Hypertension 10/27/2011  . DUB (dysfunctional uterine bleeding)   . Rosacea    KAlanson Puls PT, DPT [place] KMinette HeadlandS 102/22/2018 9:30 AM  [place] [facility] [address] [address]Phone: [phone]  Fax:  [phone] Name: EMARLET KORTEMRN: [id number] of Birth: 108/02/1946

## 2016-05-12 ENCOUNTER — Encounter: Payer: Medicare Other | Admitting: Physical Therapy

## 2016-05-14 ENCOUNTER — Encounter: Payer: Medicare Other | Admitting: Physical Therapy

## 2016-05-19 ENCOUNTER — Other Ambulatory Visit: Payer: Self-pay

## 2016-05-19 ENCOUNTER — Ambulatory Visit (INDEPENDENT_AMBULATORY_CARE_PROVIDER_SITE_OTHER): Payer: Medicare Other

## 2016-05-19 DIAGNOSIS — R9431 Abnormal electrocardiogram [ECG] [EKG]: Secondary | ICD-10-CM | POA: Diagnosis not present

## 2016-05-20 ENCOUNTER — Encounter: Payer: Medicare Other | Admitting: Physical Therapy

## 2016-05-20 ENCOUNTER — Encounter: Payer: Self-pay | Admitting: Cardiology

## 2016-05-22 ENCOUNTER — Encounter: Payer: Medicare Other | Admitting: Physical Therapy

## 2016-05-26 ENCOUNTER — Encounter: Payer: Medicare Other | Admitting: Physical Therapy

## 2016-05-28 ENCOUNTER — Encounter: Payer: Medicare Other | Admitting: Physical Therapy

## 2016-06-02 DIAGNOSIS — D1800 Hemangioma unspecified site: Secondary | ICD-10-CM | POA: Diagnosis not present

## 2016-06-02 DIAGNOSIS — L219 Seborrheic dermatitis, unspecified: Secondary | ICD-10-CM | POA: Diagnosis not present

## 2016-06-02 DIAGNOSIS — L859 Epidermal thickening, unspecified: Secondary | ICD-10-CM | POA: Diagnosis not present

## 2016-06-02 DIAGNOSIS — D229 Melanocytic nevi, unspecified: Secondary | ICD-10-CM | POA: Diagnosis not present

## 2016-06-02 DIAGNOSIS — L814 Other melanin hyperpigmentation: Secondary | ICD-10-CM | POA: Diagnosis not present

## 2016-06-02 DIAGNOSIS — L821 Other seborrheic keratosis: Secondary | ICD-10-CM | POA: Diagnosis not present

## 2016-06-02 DIAGNOSIS — Z1283 Encounter for screening for malignant neoplasm of skin: Secondary | ICD-10-CM | POA: Diagnosis not present

## 2016-07-18 ENCOUNTER — Ambulatory Visit (INDEPENDENT_AMBULATORY_CARE_PROVIDER_SITE_OTHER): Payer: Medicare Other | Admitting: Orthopaedic Surgery

## 2016-07-24 IMAGING — MG MM DIAG BREAST TOMO UNI RIGHT
6 series · 6 of 14 positions shown · non-contrast
Comparison: Previous exams.

CLINICAL DATA: Screening recall for possible distortion seen in the
right breast on the MLO view only.

EXAM:
DIGITAL DIAGNOSTIC RIGHT MAMMOGRAM WITH 3D TOMOSYNTHESIS AND CAD

[R MLO synth-2D]
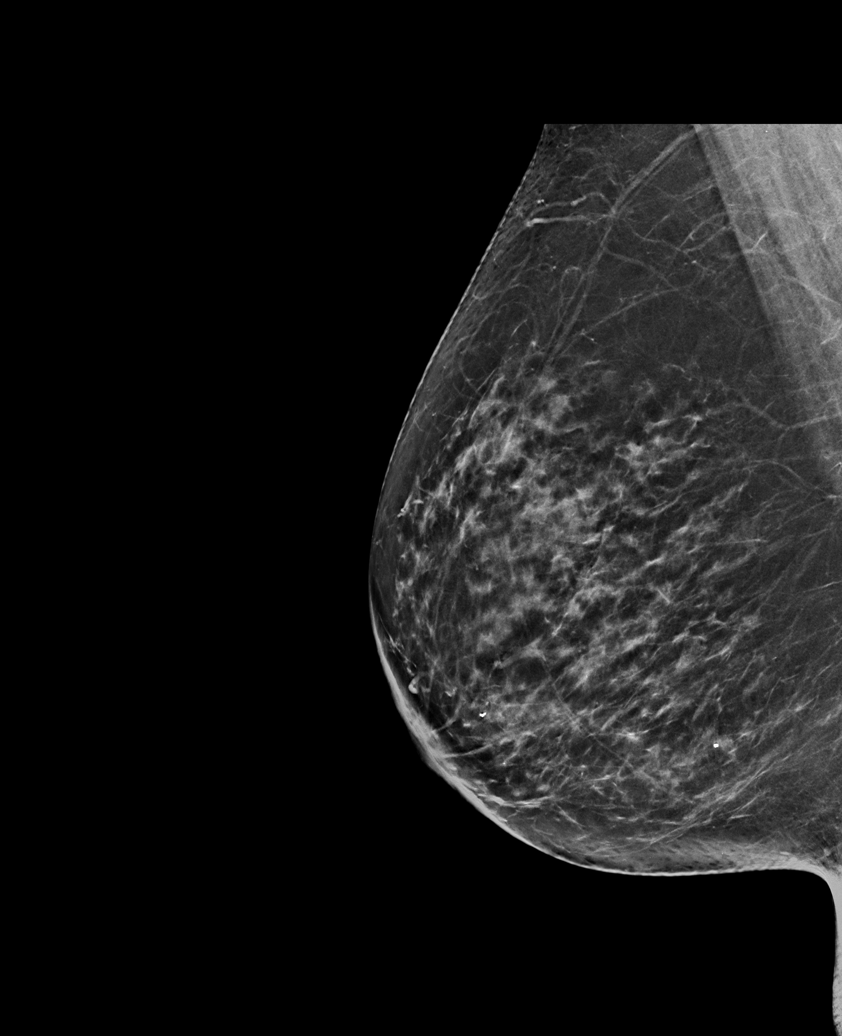

[R CC]
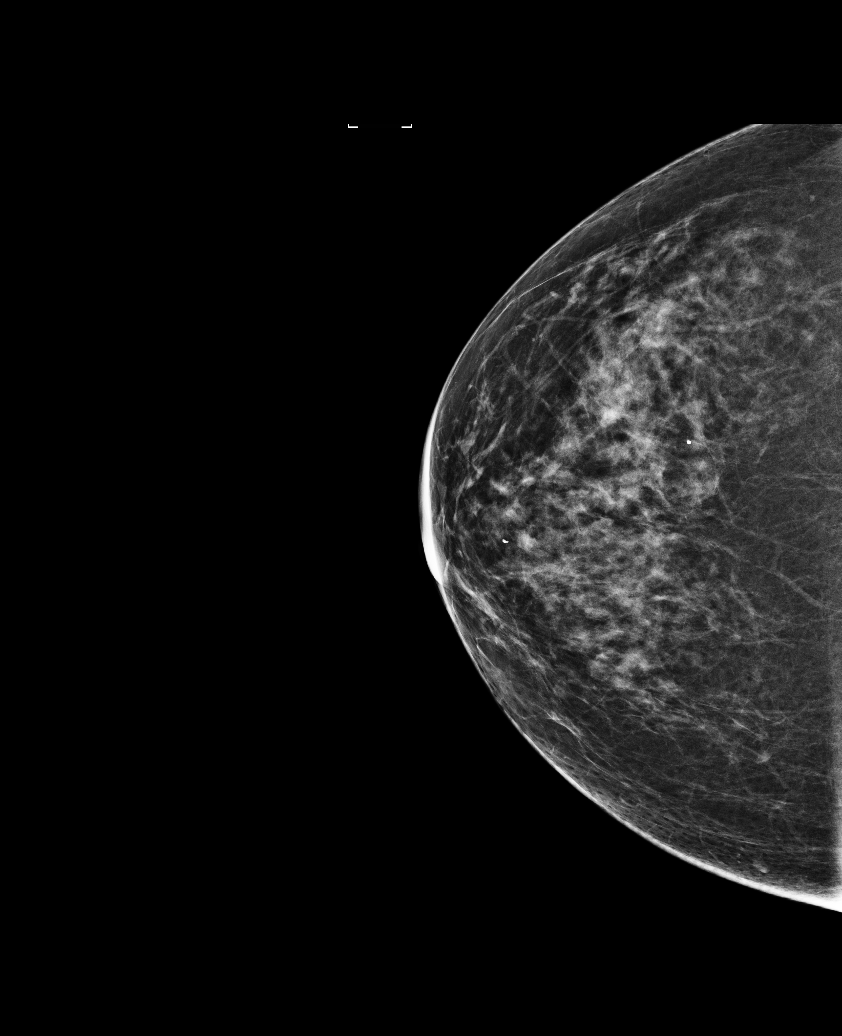

[R MLO]
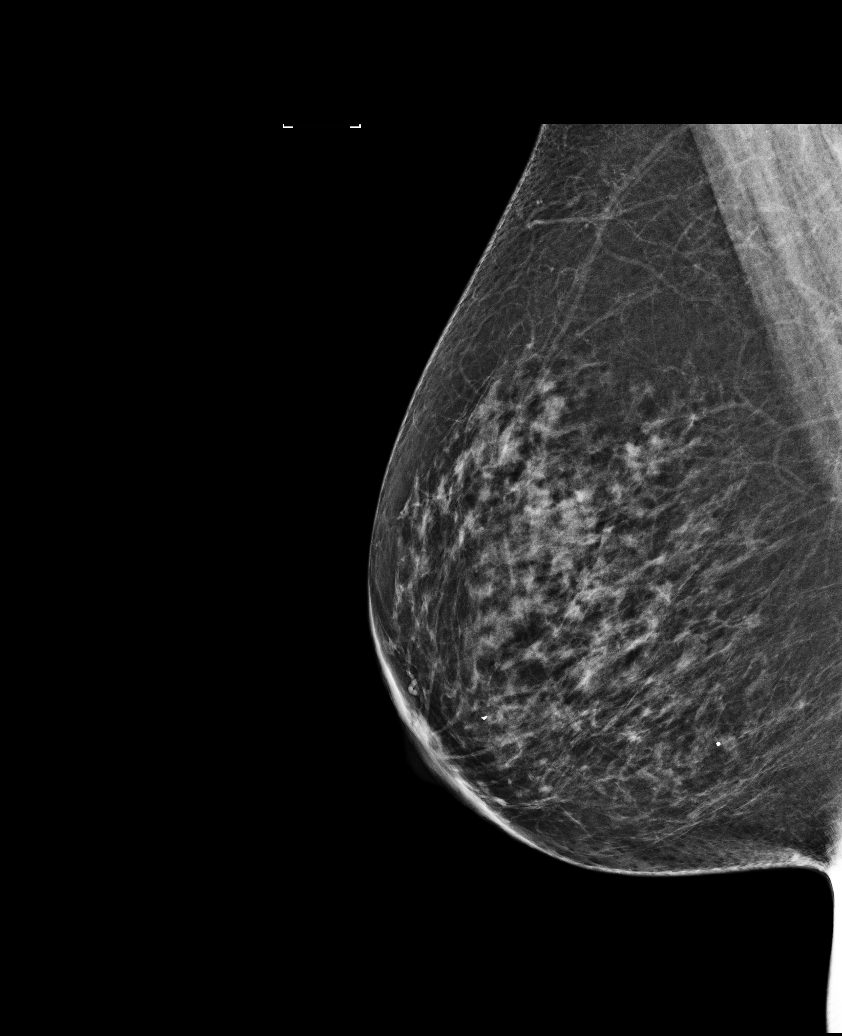

[R CC synth-2D]
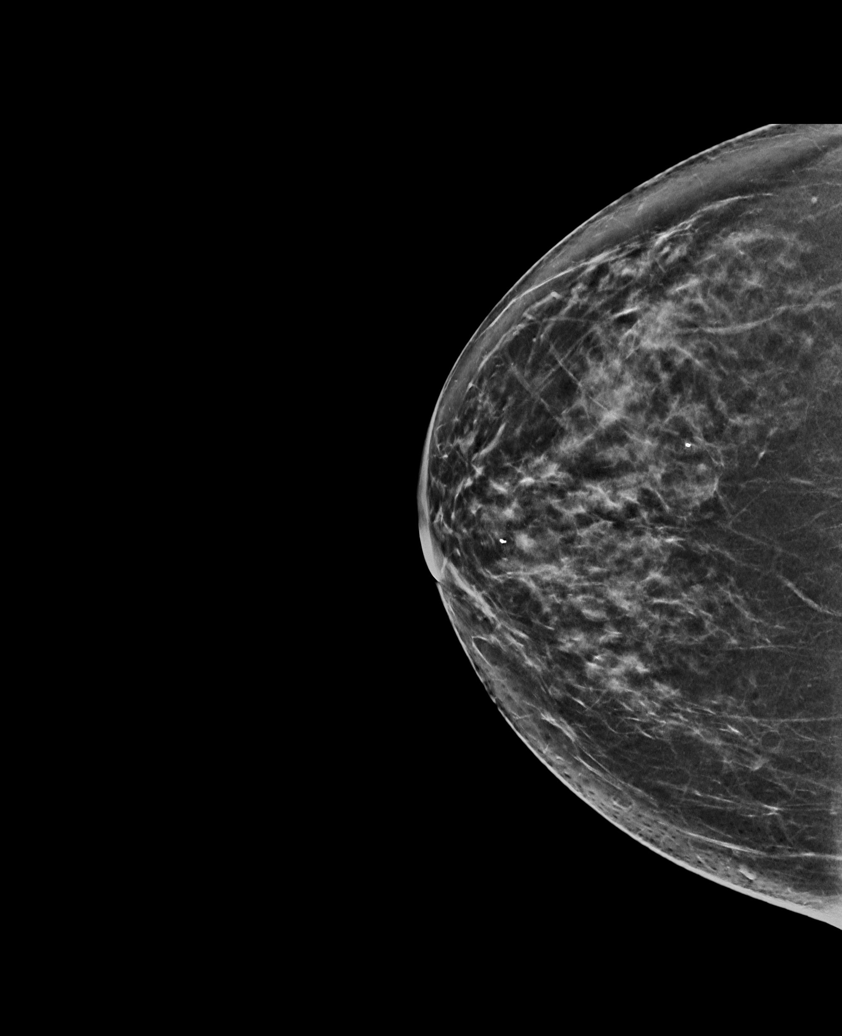

[R MLO tomo · tomo slice 37/72.0]
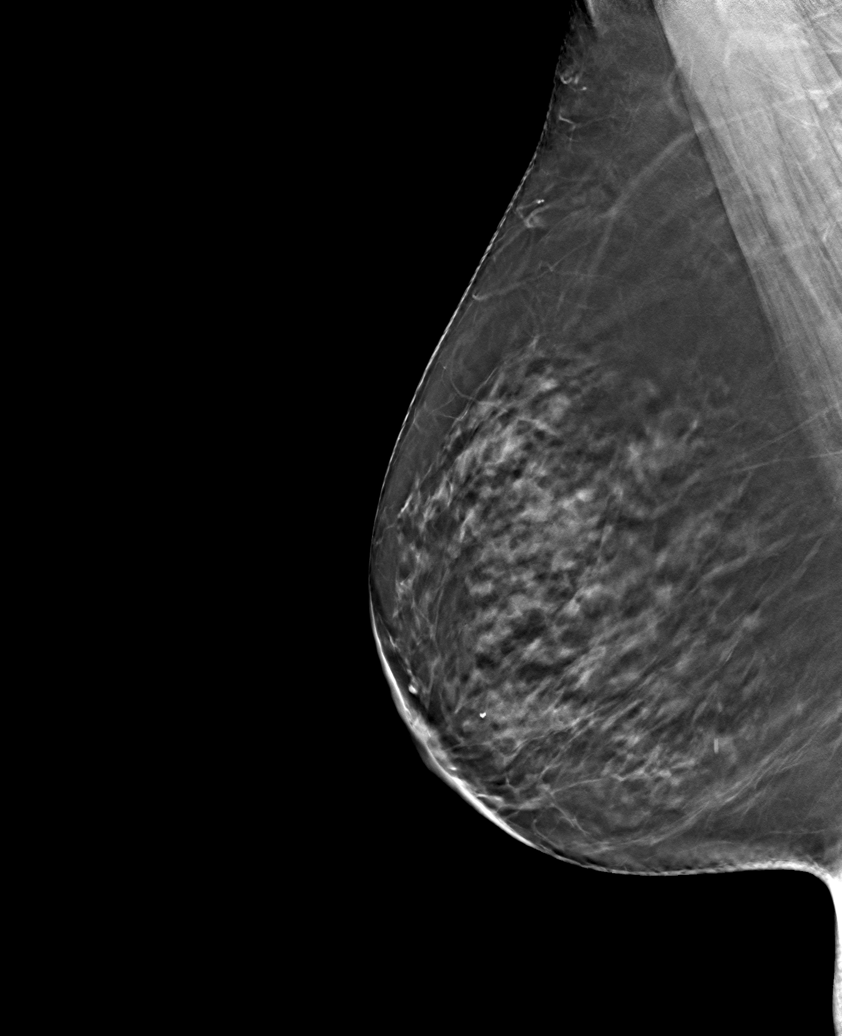

[R CC tomo · tomo slice 35/69.0]
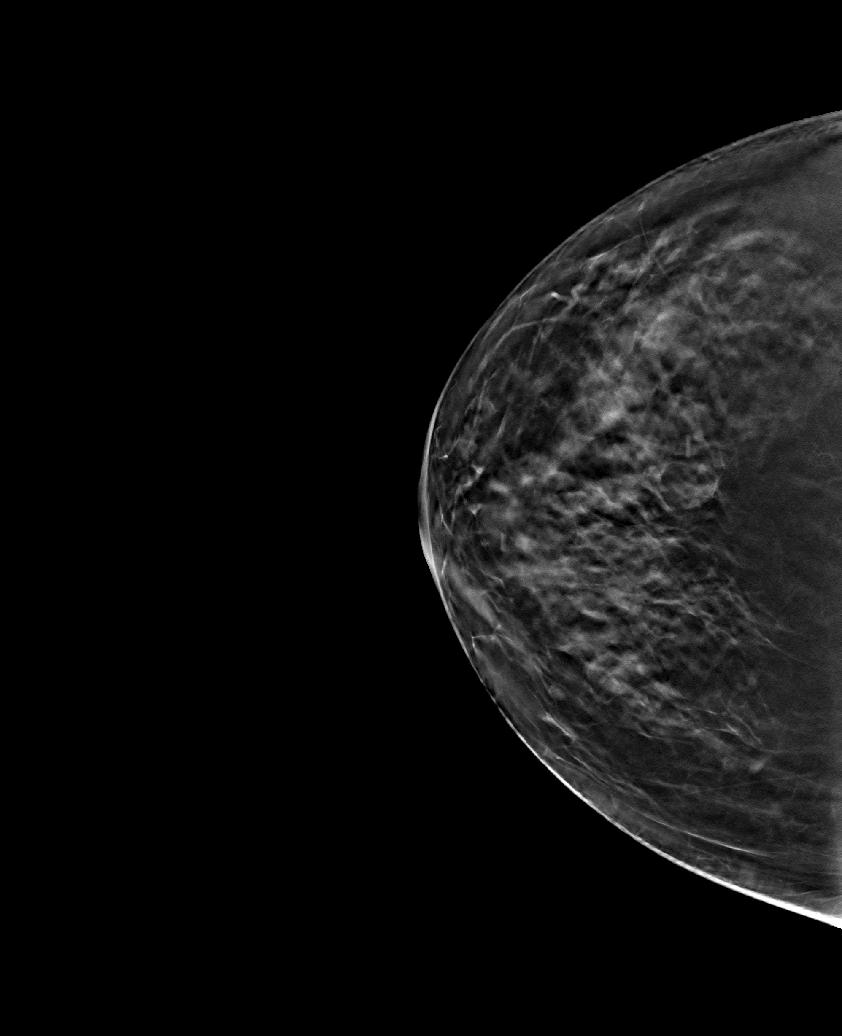

[6 of 14 positions shown; findings below may reference images not displayed]

ACR Breast Density Category c: The breast tissue is heterogeneously
dense, which may obscure small masses.
FINDINGS: Cc and MLO tomosynthesis was performed of the right breast. The
initially questioned possible distortion seen in the central right
breast on the MLO view only resolves on the additional MLO
tomosynthesis with no correlate seen on the CC tomosynthesis.
Findings are compatible with overlapping tissue. There is no
mammographic evidence of malignancy in the left breast.

Mammographic images were processed with CAD.
IMPRESSION: Initially questioned possible right breast distortion resolves on
the additional imaging compatible with overlapping tissue. There is
no mammographic evidence of malignancy in the right breast.

RECOMMENDATION:
Screening mammogram in one year.(Code:[id number])

I have discussed the findings and recommendations with the patient.
Results were also provided in writing at the conclusion of the
visit. If applicable, a reminder letter will be sent to the patient
regarding the next appointment.

BI-RADS CATEGORY  1: Negative.

## 2016-07-28 ENCOUNTER — Ambulatory Visit (INDEPENDENT_AMBULATORY_CARE_PROVIDER_SITE_OTHER): Payer: Medicare Other | Admitting: Orthopaedic Surgery

## 2016-07-28 ENCOUNTER — Encounter (INDEPENDENT_AMBULATORY_CARE_PROVIDER_SITE_OTHER): Payer: Self-pay | Admitting: Orthopaedic Surgery

## 2016-07-28 VITALS — BP 159/98 | HR 105 | Ht 64.0 in | Wt 165.0 lb

## 2016-07-28 DIAGNOSIS — Z96651 Presence of right artificial knee joint: Secondary | ICD-10-CM

## 2016-07-28 NOTE — Progress Notes (Signed)
Office Visit Note   Patient: Katherine Stanley           Date of Birth: 07-May-1945           MRN: [id number] Visit Date: 07/28/2016              Requested by: [name], MD [address] [place], [place] [address] PCP: [name], MD   Assessment & Plan: Visit Diagnoses:  1. History of total right knee replacement   Simona Huh post primary right total knee replacement in [place] months-doing very well  Plan: Office 6  Months, continue strengthening exercises   Follow-Up Instructions: Return in about 6 months (around 01/27/2017).   Orders:  No orders of the defined types were placed in this encounter.  No orders of the defined types were placed in this encounter.     Procedures: No procedures performed   Clinical Data: No additional findings.   Subjective: Chief Complaint  Patient presents with  . Right Knee - Routine Post Op    Katherine Stanley is status post 4 months Right knee replacement. She relates she has been playing golf, mowing the grass and keeping busy and is very happy with her results.  Katherine Stanley is 4 months status post right total knee replacement. She is very happy with the results and his back to most of her routine activities. She is playing golf, working in the yard, exercising without any problems. She denies any fever or chills. She doesn't use any ambulatory aid. Not taking any pain medicine. Not had any lower extremity swelling or any issues with her right knee  HPI  Review of Systems   Objective: Vital Signs: BP (!) 159/98   Pulse (!) 105   Ht 5\' 4"  (1.626 m)   Wt 165 lb (74.8 kg)   BMI 28.32 kg/m   Physical Exam  Ortho Exam right knee exam is healed very nicely without evidence of any problems. No knee effusion. Full extension the 111 of flexion with a goniometer. No opening with a varus or valgus stress. No localized areas of tenderness. No groin pain. Straight leg raise negative. No calf swelling. Skin intact.  Neurovascular exam intact.  Specialty Comments:  No specialty comments available.  Imaging: No results found.   PMFS History: Patient Active Problem List   Diagnosis Date Noted  . Unilateral primary osteoarthritis, right knee 03/18/2016  . S/P total knee replacement using cement, right 03/18/2016  . Pre-operative cardiovascular examination 03/10/2016  . Routine general medical examination at a health care facility 10/24/2015  . 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 Osteopenia 08/23/2012  . Medicare annual wellness visit, subsequent 07/19/2012  . Screening for breast cancer 07/19/2012  . 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 Hypertension 10/27/2011  . DUB (dysfunctional uterine bleeding)   . Rosacea    Past Medical History:  Diagnosis Date  . Arthritis   . Diverticulosis   . Headache    occasionally  . Hypertension    takes Lisinopril daily  . Joint pain   . Osteopenia   . Rosacea     Family History  Problem Relation Age of Onset  . Hypertension Mother   . Heart disease Mother   . Kidney disease Mother   . Diabetes Father   . Hypertension Father   . Heart disease Father   . Heart disease Brother   . Cancer Maternal Aunt   . Ovarian cancer Maternal Aunt 85  . Breast cancer Neg Hx  Past Surgical History:  Procedure Laterality Date  . CHOLECYSTECTOMY    . CHOLECYSTECTOMY    . COLONOSCOPY    . REFRACTIVE SURGERY    . SHOULDER ARTHROSCOPY WITH OPEN ROTATOR CUFF REPAIR AND DISTAL CLAVICLE ACROMINECTOMY Right 09/19/2014   Procedure: SHOULDER ARTHROSCOPY WITH MINI-OPEN ROTATOR CUFF REPAIR AND DISTAL CLAVICLE RESECTION, SUBACROMIAL DECOMPRESSION;  Surgeon: Garald Balding, MD;  Location: [place];  Service: Orthopedics;  Laterality: Right;  . TONSILLECTOMY  at age 5  . 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 TOTAL KNEE ARTHROPLASTY Right 03/18/2016   Procedure: TOTAL KNEE ARTHROPLASTY;  Surgeon: Garald Balding, MD;  Location: [place];  Service:  Orthopedics;  Laterality: Right;  Marland Kitchen VAGINAL HYSTERECTOMY  at age 86   Social History   Occupational History  . Not on file.   Social History Main Topics  . Smoking status: Never Smoker  . Smokeless tobacco: Never Used  . Alcohol use No  . Drug use: No  . Sexual activity: Yes    Birth control/ protection: Post-menopausal, Surgical

## 2016-08-04 DIAGNOSIS — H524 Presbyopia: Secondary | ICD-10-CM | POA: Diagnosis not present

## 2016-08-04 DIAGNOSIS — H04123 Dry eye syndrome of bilateral lacrimal glands: Secondary | ICD-10-CM | POA: Diagnosis not present

## 2016-08-04 DIAGNOSIS — D3131 Benign neoplasm of right choroid: Secondary | ICD-10-CM | POA: Diagnosis not present

## 2016-08-04 DIAGNOSIS — H25813 Combined forms of age-related cataract, bilateral: Secondary | ICD-10-CM | POA: Diagnosis not present

## 2016-08-07 ENCOUNTER — Encounter: Payer: Self-pay | Admitting: Family Medicine

## 2016-08-08 ENCOUNTER — Other Ambulatory Visit: Payer: Self-pay | Admitting: Family Medicine

## 2016-08-08 DIAGNOSIS — Z1239 Encounter for other screening for malignant neoplasm of breast: Secondary | ICD-10-CM

## 2016-08-28 ENCOUNTER — Ambulatory Visit: Payer: Medicare Other | Admitting: Family Medicine

## 2016-08-28 ENCOUNTER — Ambulatory Visit (INDEPENDENT_AMBULATORY_CARE_PROVIDER_SITE_OTHER): Payer: Medicare Other

## 2016-08-28 VITALS — BP 128/82 | HR 87 | Temp 98.3°F | Resp 12 | Ht 60.0 in | Wt 165.0 lb

## 2016-08-28 DIAGNOSIS — Z0000 Encounter for general adult medical examination without abnormal findings: Secondary | ICD-10-CM | POA: Diagnosis not present

## 2016-08-28 NOTE — Progress Notes (Signed)
Subjective:   Katherine Stanley is a 71 y.o. female who presents for Medicare Annual (Subsequent) preventive examination.  Review of Systems:  No ROS.  Medicare Wellness Visit.  Cardiac Risk Factors include: advanced age (>70men, >58 women);hypertension     Objective:     Vitals: BP 128/82 (BP Location: Left Arm, Patient Position: Sitting, Cuff Size: Normal)   Pulse 87   Temp 98.3 F (36.8 C) (Oral)   Resp 12   Ht 5' (1.524 m)   Wt 165 lb (74.8 kg)   SpO2 95%   BMI 32.22 kg/m   Body mass index is 32.22 kg/m.   Tobacco History  Smoking Status  . Never Smoker  Smokeless Tobacco  . Never Used     Counseling given: Not Answered   Past Medical History:  Diagnosis Date  . Arthritis   . Diverticulosis   . Headache    occasionally  . Hypertension    takes Lisinopril daily  . Joint pain   . Osteopenia   . Rosacea    Past Surgical History:  Procedure Laterality Date  . CHOLECYSTECTOMY    . CHOLECYSTECTOMY    . COLONOSCOPY    . EYE SURGERY  2013   LASIK  . REFRACTIVE SURGERY    . SHOULDER ARTHROSCOPY WITH OPEN ROTATOR CUFF REPAIR AND DISTAL CLAVICLE ACROMINECTOMY Right 09/19/2014   Procedure: SHOULDER ARTHROSCOPY WITH MINI-OPEN ROTATOR CUFF REPAIR AND DISTAL CLAVICLE RESECTION, SUBACROMIAL DECOMPRESSION;  Surgeon: Garald Balding, MD;  Location: [place];  Service: Orthopedics;  Laterality: Right;  . TONSILLECTOMY  at age 70  . 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 TOTAL KNEE ARTHROPLASTY Right 03/18/2016   Procedure: TOTAL KNEE ARTHROPLASTY;  Surgeon: Garald Balding, MD;  Location: [place];  Service: Orthopedics;  Laterality: Right;  Marland Kitchen VAGINAL HYSTERECTOMY  at age 88   Family History  Problem Relation Age of Onset  . Hypertension Mother   . Heart disease Mother   . Kidney disease Mother   . Diabetes Father   . Hypertension Father   . Heart disease Father   . Heart disease Brother   . Cancer Maternal Aunt   . Ovarian cancer Maternal Aunt 85  .  Breast cancer Neg Hx    History  Sexual Activity  . Sexual activity: Yes  . Birth control/ protection: Post-menopausal, Surgical    Outpatient Encounter Prescriptions as of 08/28/2016  Medication Sig  . acetaminophen (TYLENOL) 500 MG tablet Take 1,000 mg by mouth every 6 (six) hours as needed (for pain.).  Marland Kitchen cholecalciferol (VITAMIN D) 1000 units tablet Take 1,000 Units by mouth daily.  Marland Kitchen lisinopril (PRINIVIL,ZESTRIL) 10 MG tablet Take 1 tablet (10 mg total) by mouth daily. TAKE ONE TABLET BY MOUTH EVERY DAY (Patient taking differently: Take 10 mg by mouth daily. )  . Lysine 1000 MG TABS Take 1,000 mg by mouth daily as needed (for fever blisters).  . Nutritional Supplements (NUTRITIONAL SUPPLEMENT PO) Take 1 tablet by mouth daily.  . Probiotic Product (PROBIOTIC DAILY PO) Take 1 capsule by mouth daily.   Marland Kitchen Propylene Glycol-Glycerin (SOOTHE) 0.6-0.6 % SOLN Place 1 drop into both eyes daily.  . vitamin B-12 (CYANOCOBALAMIN) 1000 MCG tablet Take 1,000 mcg by mouth daily.  . [DISCONTINUED] oxyCODONE-acetaminophen (PERCOCET/ROXICET) 5-325 MG tablet Take 1 tablet by mouth at bedtime as needed for severe pain.   No facility-administered encounter medications on file as of 08/28/2016.     Activities of Daily Living In your present state  of health, do you have any difficulty performing the following activities: 08/28/2016 03/18/2016  Hearing? N -  Vision? N -  Difficulty concentrating or making decisions? N -  Walking or climbing stairs? N -  Dressing or bathing? N -  Doing errands, shopping? N Y  Conservation officer, nature and eating ? N -  Using the Toilet? N -  In the past six months, have you accidently leaked urine? N -  Do you have problems with loss of bowel control? N -  Managing your Medications? N -  Managing your Finances? N -  Housekeeping or managing your Housekeeping? N -  Some recent data might be hidden    Patient Care Team: [name], MD as PCP - General (Family Medicine)     Assessment:    This is a routine wellness examination for Katherine Stanley. The goal of the wellness visit is to assist the patient how to close the gaps in care and create a preventative care plan for the patient.   Taking calcium VIT D as appropriate/Osteoporosis risk reviewed.  Medications reviewed; taking without issues or barriers.  Safety issues reviewed; smoke detectors in the home. No firearms in the home.  Wears seatbelts when driving or riding with others. Patient does wear sunscreen or protective clothing when in direct sunlight. No violence in the home.  Depression- PHQ 2 &9 complete.  No signs/symptoms or verbal communication regarding little pleasure in doing things, feeling down, depressed or hopeless. No changes in sleeping, energy, eating, concentrating.  No thoughts of self harm or harm towards others.  Time spent on this topic is 8 minutes.   Patient is alert, normal appearance, oriented to person/place/and time. Correctly identified the president of the [place], recall of 2/3 words, and performing simple calculations.  Patient displays appropriate judgement and can read correct time from watch face.  No new identified risk were noted.  No failures at ADL's or IADL's.   BMI- discussed the importance of a healthy diet, water intake and exercise. Educational material provided.   24 hour diet recall: Breakfast: bologna and egg sandwich Dinner: pizza Snack: chococolate Daily fluid intake: 1.5 cups of caffeine, 3 cups of water, 1 cup juice  HTN- followed by PCP.  Dental- every six months.  Dr. Candy Sledge.  Eye- Visual acuity not assessed per patient preference since they have regular follow up with the ophthalmologist.  Wears corrective lenses.  Sleep patterns- Sleeps 7-8 hours at night.  Wakes feeling rested.  Health maintenance gaps- closed.  Patient Concerns: None at this time. Follow up with PCP as needed.  Exercise Activities and Dietary  recommendations Current Exercise Habits: Home exercise routine, Type of exercise: calisthenics;walking, Time (Minutes): 15, Frequency (Times/Week): 4, Weekly Exercise (Minutes/Week): 60, Intensity: Mild  Goals    . Healthy Lifestyle          Stay hydrated and drink plenty of fluids. Low carb foods. Lean meats and vegetables. Stay active and continue exercising as tolerated.      Fall Risk Fall Risk  08/28/2016 10/24/2015 08/29/2015 07/20/2013 07/19/2012  Falls in the past year? No No No No No   Depression Screen PHQ 2/9 Scores 08/28/2016 10/24/2015 08/29/2015 07/20/2013  PHQ - 2 Score 0 0 0 0  PHQ- 9 Score 0 - - -     Cognitive Function MMSE - Mini Mental State Exam 08/28/2016 08/29/2015  Orientation to time 5 5  Orientation to Place 5 5  Registration 3 3  Attention/ Calculation 5 5  Recall 2 3  Language- name 2 objects 2 2  Language- repeat 1 1  Language- follow 3 step command 3 3  Language- read & follow direction 1 1  Write a sentence 1 1  Copy design 1 1  Total score 29 30        Immunization History  Administered Date(s) Administered  . Influenza,inj,Quad PF,36+ Mos 02/24/2013, 01/23/2014, 02/28/2015, 01/28/2016  . Pneumococcal Conjugate-13 07/20/2013  . Pneumococcal Polysaccharide-23 07/24/2014  . Td 07/16/2010   Screening Tests Health Maintenance  Topic Date Due  . MAMMOGRAM  09/18/2016  . INFLUENZA VACCINE  11/12/2016  . COLONOSCOPY  10/27/2019  . TETANUS/TDAP  07/15/2020  . DEXA SCAN  Completed  . Hepatitis C Screening  Completed  . PNA vac Low Risk Adult  Completed      Plan:    End of life planning; Advance aging; Advanced directives discussed. Copy of current HCPOA/Living Will requested.    I have personally reviewed and noted the following in the patient's chart:   . Medical and social history . Use of alcohol, tobacco or illicit drugs  . Current medications and supplements . Functional ability and status . Nutritional status . Physical  activity . Advanced directives . List of other physicians . Hospitalizations, surgeries, and ER visits in previous 12 months . Vitals . Screenings to include cognitive, depression, and falls . Referrals and appointments  In addition, I have reviewed and discussed with patient certain preventive protocols, quality metrics, and best practice recommendations. A written personalized care plan for preventive services as well as general preventive health recommendations were provided to patient.     Varney Biles, LPN  10/24/4578

## 2016-08-28 NOTE — Progress Notes (Signed)
Care was provided under my supervision. I agree with the management as indicated in the note.  Yunis Voorheis DO  

## 2016-08-28 NOTE — Patient Instructions (Addendum)
  Katherine Stanley , Thank you for taking time to come for your Medicare Wellness Visit. I appreciate your ongoing commitment to your health goals. Please review the following plan we discussed and let me know if I can assist you in the future.   Follow up with Dr. Caryl Bis as needed.    Bring a copy of your [place] and/or Living Will to be scanned into chart.  Have a great day!  These are the goals we discussed: Goals    . Healthy Lifestyle          Stay hydrated and drink plenty of fluids. Low carb foods. Lean meats and vegetables. Stay active and continue exercising as tolerated.       This is a list of the screening recommended for you and due dates:  Health Maintenance  Topic Date Due  . Mammogram  09/18/2016  . Flu Shot  11/12/2016  . Colon Cancer Screening  10/27/2019  . Tetanus Vaccine  07/15/2020  . DEXA scan (bone density measurement)  Completed  .  Hepatitis C: One time screening is recommended by Center for Disease Control  (CDC) for  adults born from [birth year] through 1964.   Completed  . Pneumonia vaccines  Completed

## 2016-09-22 ENCOUNTER — Ambulatory Visit
Admission: RE | Admit: 2016-09-22 | Discharge: 2016-09-22 | Disposition: A | Discharge status: Home / Self Care | Payer: Medicare Other | Source: Ambulatory Visit | Attending: Family Medicine | Admitting: Family Medicine

## 2016-09-22 DIAGNOSIS — Z1231 Encounter for screening mammogram for malignant neoplasm of breast: Secondary | ICD-10-CM | POA: Diagnosis not present

## 2016-09-22 DIAGNOSIS — Z1239 Encounter for other screening for malignant neoplasm of breast: Secondary | ICD-10-CM

## 2016-10-03 ENCOUNTER — Ambulatory Visit (INDEPENDENT_AMBULATORY_CARE_PROVIDER_SITE_OTHER): Payer: Medicare Other | Admitting: Family Medicine

## 2016-10-03 ENCOUNTER — Encounter: Payer: Self-pay | Admitting: Family Medicine

## 2016-10-03 VITALS — BP 130/80 | HR 85 | Temp 98.2°F | Ht 60.0 in | Wt 166.0 lb

## 2016-10-03 DIAGNOSIS — I10 Essential (primary) hypertension: Secondary | ICD-10-CM | POA: Diagnosis not present

## 2016-10-03 DIAGNOSIS — Z780 Asymptomatic menopausal state: Secondary | ICD-10-CM

## 2016-10-03 DIAGNOSIS — M8580 Other specified disorders of bone density and structure, unspecified site: Secondary | ICD-10-CM | POA: Diagnosis not present

## 2016-10-03 LAB — BASIC METABOLIC PANEL
BUN: 21 mg/dL (ref 6–23)
CALCIUM: 9.6 mg/dL (ref 8.4–10.5)
CHLORIDE: 108 meq/L (ref 96–112)
CO2: 21 meq/L (ref 19–32)
CREATININE: 0.62 mg/dL (ref 0.40–1.20)
GFR: 101.24 mL/min (ref >60.00)
Glucose, Bld: 102 mg/dL — ABNORMAL HIGH (ref 70–99)
Potassium: 4 mEq/L (ref 3.5–5.1)
Sodium: 139 mEq/L (ref 135–145)

## 2016-10-03 NOTE — Progress Notes (Signed)
  [name], MD Phone: [phone]  Katherine Stanley is a 71 y.o. female who presents today for follow-up.  Hypertension: Typically running 117/82-132/78, no chest pain, shortness breath, or edema. She is taking lisinopril.  Osteopenia: Does take a multivitamin with calcium and vitamin D. She was intolerant of Fosamax previously. No history of fracture. She is due for DEXA scan.  ROS see history of present illness  Objective  Physical Exam Vitals:   10/03/16 1036  BP: 130/80  Pulse: 85  Temp: 98.2 F (36.8 C)    BP Readings from Last 3 Encounters:  10/03/16 130/80  08/28/16 128/82  07/28/16 (!) 159/98   Wt Readings from Last 3 Encounters:  10/03/16 166 lb (75.3 kg)  08/28/16 165 lb (74.8 kg)  07/28/16 165 lb (74.8 kg)    Physical Exam  Constitutional: No distress.  Cardiovascular: Normal rate, regular rhythm and normal heart sounds.   Pulmonary/Chest: Effort normal and breath sounds normal.  Musculoskeletal: She exhibits no edema.  Neurological: She is alert. Gait normal.  Skin: She is not diaphoretic.     Assessment/Plan: Please see individual problem list.  Hypertension At goal. Continue current medications. BMP today.  Osteopenia Due for repeat DEXA scan. She'll continue calcium and vitamin D. DEXA scan ordered.   Orders Placed This Encounter  Procedures  . DG Bone Density    Standing Status:   Future    Standing Expiration Date:   12/03/2017    Order Specific Question:   Reason for Exam (SYMPTOM  OR DIAGNOSIS REQUIRED)    Answer:   post menopausal estrogen deficiency    Order Specific Question:   Preferred imaging location?    Answer:   [facility] (BMET)    Meds ordered this encounter  Medications  . Multiple Vitamins-Minerals (EYE VITAMINS PO)    Sig: Take by mouth.   [name], MD [place]

## 2016-10-03 NOTE — Patient Instructions (Signed)
Nice to see you. We'll get you set up for bone density scan. We'll get some lab work today and contact you with the results.

## 2016-10-03 NOTE — Assessment & Plan Note (Signed)
At goal. Continue current medications. BMP today.

## 2016-10-03 NOTE — Assessment & Plan Note (Signed)
Due for repeat DEXA scan. She'll continue calcium and vitamin D. DEXA scan ordered.

## 2016-10-10 ENCOUNTER — Encounter: Payer: Self-pay | Admitting: Family Medicine

## 2016-11-14 ENCOUNTER — Ambulatory Visit
Admission: RE | Admit: 2016-11-14 | Discharge: 2016-11-14 | Disposition: A | Discharge status: Home / Self Care | Payer: Medicare Other | Source: Ambulatory Visit | Attending: Family Medicine | Admitting: Family Medicine

## 2016-11-14 DIAGNOSIS — M8580 Other specified disorders of bone density and structure, unspecified site: Secondary | ICD-10-CM | POA: Insufficient documentation

## 2016-11-14 DIAGNOSIS — Z780 Asymptomatic menopausal state: Secondary | ICD-10-CM

## 2016-11-26 ENCOUNTER — Ambulatory Visit: Payer: Medicare Other

## 2016-12-09 ENCOUNTER — Other Ambulatory Visit: Payer: Self-pay

## 2016-12-09 DIAGNOSIS — I10 Essential (primary) hypertension: Secondary | ICD-10-CM

## 2016-12-09 MED ORDER — LISINOPRIL 10 MG PO TABS: 10.0000 mg | ORAL_TABLET | Freq: Every day | ORAL | 3 refills | Status: DC

## 2016-12-09 NOTE — Telephone Encounter (Signed)
Last OV 10/03/16 last filled by Dr.Walker 10/24/15 90 3rf

## 2017-01-16 ENCOUNTER — Ambulatory Visit (INDEPENDENT_AMBULATORY_CARE_PROVIDER_SITE_OTHER): Payer: Medicare Other | Admitting: *Deleted

## 2017-01-16 DIAGNOSIS — Z23 Encounter for immunization: Secondary | ICD-10-CM | POA: Diagnosis not present

## 2017-02-02 ENCOUNTER — Ambulatory Visit (INDEPENDENT_AMBULATORY_CARE_PROVIDER_SITE_OTHER): Payer: Medicare Other | Admitting: Orthopaedic Surgery

## 2017-02-02 ENCOUNTER — Encounter (INDEPENDENT_AMBULATORY_CARE_PROVIDER_SITE_OTHER): Payer: Self-pay | Admitting: Orthopaedic Surgery

## 2017-02-02 VITALS — BP 145/81 | HR 90 | Resp 14 | Ht 64.0 in | Wt 165.0 lb

## 2017-02-02 DIAGNOSIS — Z96651 Presence of right artificial knee joint: Secondary | ICD-10-CM

## 2017-02-02 NOTE — Progress Notes (Signed)
Office Visit Note   Patient: Katherine Stanley           Date of Birth: [birth year]           MRN: [id number] Visit Date: 02/02/2017              Requested by: [name], MD [address] [place], [place] [address] PCP: [name], MD   Assessment & Plan: Visit Diagnoses:  1. History of total right knee replacement     Plan: Ten and one half  months status post primary right total knee replacement doing very well. She is very happy with results. No issues. Encourage exercises. Return as needed  Follow-Up Instructions: Return if symptoms worsen or fail to improve.   Orders:  No orders of the defined types were placed in this encounter.  No orders of the defined types were placed in this encounter.     Procedures: No procedures performed   Clinical Data: No additional findings.   Subjective: Chief Complaint  Patient presents with  . Right Knee - Routine Post Op    Katherine Stanley is a 71 y o S/P 10 months Right TKA. She relates no pain and is feeling great.  Denies any fever or chills. Very happy with the results. Is playing golf without any functional limitations  HPI  Review of Systems  Constitutional: Negative for chills, fatigue and fever.  Eyes: Negative for itching.  Respiratory: Negative for chest tightness and shortness of breath.   Cardiovascular: Negative for chest pain, palpitations and leg swelling.  Gastrointestinal: Negative for blood in stool, constipation and diarrhea.  Endocrine: Negative for polyuria.  Genitourinary: Negative for dysuria.  Musculoskeletal: Negative for back pain, joint swelling, neck pain and neck stiffness.  Allergic/Immunologic: Negative for immunocompromised state.  Neurological: Negative for dizziness and numbness.  Hematological: Does not bruise/bleed easily.  Psychiatric/Behavioral: The patient is not nervous/anxious.      Objective: Vital Signs: BP (!) 145/81   Pulse 90   Resp 14   Ht 5'  4" (1.626 m)   Wt 165 lb (74.8 kg)   BMI 28.32 kg/m   Physical Exam  Ortho Exam awake alert and oriented 3. Comfortable sitting. Brisk walk without limp. Full quick extension. No effusion. Incision healed very nicely. No instability. No popliteal mass GERD no distal edema. Neurovascular exam intact. Flexed over 105. Straight leg raise negative. Painless range of motion both hips.    Imaging: No results found.   PMFS History: Patient Active Problem List   Diagnosis Date Noted  . Unilateral primary osteoarthritis, right knee 03/18/2016  . S/P total knee replacement using cement, right 03/18/2016  . 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 Osteopenia 08/23/2012  . Screening for breast cancer 07/19/2012  . 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 Hypertension 10/27/2011  . Rosacea    Past Medical History:  Diagnosis Date  . Arthritis   . Diverticulosis   . Headache    occasionally  . Hypertension    takes Lisinopril daily  . Joint pain   . Osteopenia   . Rosacea     Family History  Problem Relation Age of Onset  . Hypertension Mother   . Heart disease Mother   . Kidney disease Mother   . Diabetes Father   . Hypertension Father   . Heart disease Father   . Heart disease Brother   . Cancer Maternal Aunt   . Ovarian cancer Maternal Aunt 85  . Breast  cancer Neg Hx     Past Surgical History:  Procedure Laterality Date  . CHOLECYSTECTOMY    . CHOLECYSTECTOMY    . COLONOSCOPY    . EYE SURGERY  2013   LASIK  . REFRACTIVE SURGERY    . SHOULDER ARTHROSCOPY WITH OPEN ROTATOR CUFF REPAIR AND DISTAL CLAVICLE ACROMINECTOMY Right 09/19/2014   Procedure: SHOULDER ARTHROSCOPY WITH MINI-OPEN ROTATOR CUFF REPAIR AND DISTAL CLAVICLE RESECTION, SUBACROMIAL DECOMPRESSION;  Surgeon: Garald Balding, MD;  Location: [facility];  Service: Orthopedics;  Laterality: Right;  . TONSILLECTOMY  at age 13  . 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 TOTAL KNEE ARTHROPLASTY Right 03/18/2016     Procedure: TOTAL KNEE ARTHROPLASTY;  Surgeon: Garald Balding, MD;  Location: [facility];  Service: Orthopedics;  Laterality: Right;  Marland Kitchen VAGINAL HYSTERECTOMY  at age 55   Social History   Occupational History  . Not on file.   Social History Main Topics  . Smoking status: Never Smoker  . Smokeless tobacco: Never Used  . Alcohol use No  . Drug use: No  . Sexual activity: Yes    Birth control/ protection: Post-menopausal, Surgical

## 2017-06-09 DIAGNOSIS — L219 Seborrheic dermatitis, unspecified: Secondary | ICD-10-CM | POA: Diagnosis not present

## 2017-06-09 DIAGNOSIS — B009 Herpesviral infection, unspecified: Secondary | ICD-10-CM | POA: Diagnosis not present

## 2017-06-09 DIAGNOSIS — L821 Other seborrheic keratosis: Secondary | ICD-10-CM | POA: Diagnosis not present

## 2017-06-09 DIAGNOSIS — Z1283 Encounter for screening for malignant neoplasm of skin: Secondary | ICD-10-CM | POA: Diagnosis not present

## 2017-06-09 DIAGNOSIS — L814 Other melanin hyperpigmentation: Secondary | ICD-10-CM | POA: Diagnosis not present

## 2017-06-09 DIAGNOSIS — L918 Other hypertrophic disorders of the skin: Secondary | ICD-10-CM | POA: Diagnosis not present

## 2017-06-09 DIAGNOSIS — D1800 Hemangioma unspecified site: Secondary | ICD-10-CM | POA: Diagnosis not present

## 2017-06-09 DIAGNOSIS — L853 Xerosis cutis: Secondary | ICD-10-CM | POA: Diagnosis not present

## 2017-06-09 DIAGNOSIS — D225 Melanocytic nevi of trunk: Secondary | ICD-10-CM | POA: Diagnosis not present

## 2017-06-09 DIAGNOSIS — D2239 Melanocytic nevi of other parts of face: Secondary | ICD-10-CM | POA: Diagnosis not present

## 2017-06-09 DIAGNOSIS — L820 Inflamed seborrheic keratosis: Secondary | ICD-10-CM | POA: Diagnosis not present

## 2017-06-09 DIAGNOSIS — I788 Other diseases of capillaries: Secondary | ICD-10-CM | POA: Diagnosis not present

## 2017-08-10 DIAGNOSIS — H43393 Other vitreous opacities, bilateral: Secondary | ICD-10-CM | POA: Diagnosis not present

## 2017-08-10 DIAGNOSIS — H04123 Dry eye syndrome of bilateral lacrimal glands: Secondary | ICD-10-CM | POA: Diagnosis not present

## 2017-08-10 DIAGNOSIS — H524 Presbyopia: Secondary | ICD-10-CM | POA: Diagnosis not present

## 2017-08-10 DIAGNOSIS — H2513 Age-related nuclear cataract, bilateral: Secondary | ICD-10-CM | POA: Diagnosis not present

## 2017-08-10 DIAGNOSIS — H25043 Posterior subcapsular polar age-related cataract, bilateral: Secondary | ICD-10-CM | POA: Diagnosis not present

## 2017-08-18 ENCOUNTER — Encounter: Payer: Self-pay | Admitting: Family Medicine

## 2017-08-18 DIAGNOSIS — M8580 Other specified disorders of bone density and structure, unspecified site: Secondary | ICD-10-CM

## 2017-08-18 DIAGNOSIS — D649 Anemia, unspecified: Secondary | ICD-10-CM

## 2017-08-18 DIAGNOSIS — I10 Essential (primary) hypertension: Secondary | ICD-10-CM

## 2017-08-20 ENCOUNTER — Telehealth: Payer: Self-pay | Admitting: Family Medicine

## 2017-08-20 ENCOUNTER — Telehealth: Payer: Self-pay

## 2017-08-20 NOTE — Telephone Encounter (Signed)
Copied from [place] [id number]. Topic: Referral - Request >> Aug 20, 2017  1:47 PM Boyd Kerbs wrote: Reason for CRM:   Pt. Is needing referral to [facility] for Mammogram  Let her know appt

## 2017-08-20 NOTE — Telephone Encounter (Signed)
Copied from [place] [id number]. Topic: Appointment Scheduling - Scheduling Inquiry for Clinic >> Aug 20, 2017  9:09 AM Aurelio Brash B wrote: Reason for CRM:  PT would like to schedule an apt with Dr Caryl Bis to discuss lab results on 21, 22,23 or 24th ,    the only openings those days are same day and hospital follow ups , the soonest apt I could offer was in June-  pt wants to know if she can be seen sooner  PT says it is ok to leave message  Patient has been scheduled to see Dr. Caryl Bis on 09-11-17. Patient has been notified and is ok with this appointment.

## 2017-08-28 ENCOUNTER — Other Ambulatory Visit: Payer: Self-pay | Admitting: Family Medicine

## 2017-08-28 ENCOUNTER — Other Ambulatory Visit (INDEPENDENT_AMBULATORY_CARE_PROVIDER_SITE_OTHER): Payer: Medicare Other

## 2017-08-28 DIAGNOSIS — I10 Essential (primary) hypertension: Secondary | ICD-10-CM | POA: Diagnosis not present

## 2017-08-28 DIAGNOSIS — D649 Anemia, unspecified: Secondary | ICD-10-CM

## 2017-08-28 DIAGNOSIS — Z1231 Encounter for screening mammogram for malignant neoplasm of breast: Secondary | ICD-10-CM

## 2017-08-28 LAB — COMPREHENSIVE METABOLIC PANEL
ALK PHOS: 52 U/L (ref 39–117)
ALT: 18 U/L (ref 0–35)
AST: 18 U/L (ref 0–37)
Albumin: 4.1 g/dL (ref 3.5–5.2)
BILIRUBIN TOTAL: 0.6 mg/dL (ref 0.2–1.2)
BUN: 17 mg/dL (ref 6–23)
CO2: 23 meq/L (ref 19–32)
Calcium: 9.4 mg/dL (ref 8.4–10.5)
Chloride: 107 mEq/L (ref 96–112)
Creatinine, Ser: 0.63 mg/dL (ref 0.40–1.20)
GFR: 99.13 mL/min (ref >60.00)
GLUCOSE: 115 mg/dL — AB (ref 70–99)
Potassium: 4.1 mEq/L (ref 3.5–5.1)
SODIUM: 139 meq/L (ref 135–145)
TOTAL PROTEIN: 7.2 g/dL (ref 6.0–8.3)

## 2017-08-28 LAB — LIPID PANEL
Cholesterol: 141 mg/dL (ref 0–200)
HDL: 50.2 mg/dL (ref >39.00)
LDL Cholesterol: 67 mg/dL (ref 0–99)
NONHDL: 90.47
Total CHOL/HDL Ratio: 3
Triglycerides: 118 mg/dL (ref 0.0–149.0)
VLDL: 23.6 mg/dL (ref 0.0–40.0)

## 2017-08-28 LAB — CBC
HEMATOCRIT: 40.5 % (ref 36.0–46.0)
HEMOGLOBIN: 13.7 g/dL (ref 12.0–15.0)
MCHC: 33.9 g/dL (ref 30.0–36.0)
MCV: 92.2 fl (ref 78.0–100.0)
PLATELETS: 255 10*3/uL (ref 150.0–400.0)
RBC: 4.39 Mil/uL (ref 3.87–5.11)
RDW: 12.4 % (ref 11.5–15.5)
WBC: 6.8 10*3/uL (ref 4.0–10.5)

## 2017-08-31 ENCOUNTER — Ambulatory Visit (INDEPENDENT_AMBULATORY_CARE_PROVIDER_SITE_OTHER): Payer: Medicare Other

## 2017-08-31 VITALS — BP 120/82 | HR 92 | Temp 98.0°F | Resp 14 | Ht 60.25 in | Wt 167.0 lb

## 2017-08-31 DIAGNOSIS — Z0000 Encounter for general adult medical examination without abnormal findings: Secondary | ICD-10-CM

## 2017-08-31 NOTE — Progress Notes (Signed)
Subjective:   CAMIA DIPINTO is a 72 y.o. female who presents for Medicare Annual (Subsequent) preventive examination.  Review of Systems:  No ROS.  Medicare Wellness Visit. Additional risk factors are reflected in the social history.  Cardiac Risk Factors include: advanced age (>30men, >28 women);hypertension     Objective:     Vitals: BP 120/82 (BP Location: Left Arm, Patient Position: Sitting, Cuff Size: Normal)   Pulse 92   Temp 98 F (36.7 C) (Oral)   Resp 14   Ht 5' 0.25" (1.53 m)   Wt 167 lb (75.8 kg)   SpO2 95%   BMI 32.34 kg/m   Body mass index is 32.34 kg/m.  Advanced Directives 08/31/2017 08/28/2016 04/02/2016 03/18/2016 03/18/2016 03/12/2016 08/29/2015  Does Patient Have a Medical Advance Directive? Yes Yes Yes Yes Yes Yes Yes  Type of Paramedic of [place];Living will [place];Living will - Healthcare Power of [place] of [place];Living will  Does patient want to make changes to medical advance directive? No - Patient declined No - Patient declined - No - Patient declined - - -  Copy of [place] in Chart? No - copy requested No - copy requested - No - copy requested No - copy requested No - copy requested No - copy requested    Tobacco Social History   Tobacco Use  Smoking Status Never Smoker  Smokeless Tobacco Never Used     Counseling given: Not Answered   Clinical Intake:  Pre-visit preparation completed: Yes  Pain : No/denies pain     Nutritional Status: BMI 25 -29 Overweight Diabetes: No  How often do you need to have someone help you when you read instructions, pamphlets, or other written materials from your doctor or pharmacy?: 1 - Never  Interpreter Needed?: No     Past Medical History:  Diagnosis Date  . Arthritis   . Diverticulosis   . Headache    occasionally  . Hypertension    takes  Lisinopril daily  . Joint pain   . Osteopenia   . Rosacea    Past Surgical History:  Procedure Laterality Date  . CHOLECYSTECTOMY    . CHOLECYSTECTOMY    . COLONOSCOPY    . EYE SURGERY  2013   LASIK  . REFRACTIVE SURGERY    . SHOULDER ARTHROSCOPY WITH OPEN ROTATOR CUFF REPAIR AND DISTAL CLAVICLE ACROMINECTOMY Right 09/19/2014   Procedure: SHOULDER ARTHROSCOPY WITH MINI-OPEN ROTATOR CUFF REPAIR AND DISTAL CLAVICLE RESECTION, SUBACROMIAL DECOMPRESSION;  Surgeon: Garald Balding, MD;  Location: [facility];  Service: Orthopedics;  Laterality: Right;  . TONSILLECTOMY  at age 47  . 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 TOTAL KNEE ARTHROPLASTY Right 03/18/2016   Procedure: TOTAL KNEE ARTHROPLASTY;  Surgeon: Garald Balding, MD;  Location: [facility];  Service: Orthopedics;  Laterality: Right;  Marland Kitchen VAGINAL HYSTERECTOMY  at age 65   Family History  Problem Relation Age of Onset  . Hypertension Mother   . Heart disease Mother   . Kidney disease Mother   . Diabetes Father   . Hypertension Father   . Heart disease Father   . Heart disease Brother   . Cancer Maternal Aunt   . Ovarian cancer Maternal Aunt 85  . Stroke Brother   . Alzheimer's disease Brother   . Diabetes Paternal Grandmother   . Breast cancer Neg Hx    Social History  Socioeconomic History  . Marital status: Married    Spouse name: Not on file  . Number of children: Not on file  . Years of education: Not on file  . Highest education level: Not on file  Occupational History  . Not on file  Social Needs  . Financial resource strain: Not hard at all  . Food insecurity:    Worry: Never true    Inability: Never true  . Transportation needs:    Medical: Not on file    Non-medical: Not on file  Tobacco Use  . Smoking status: Never Smoker  . Smokeless tobacco: Never Used  Substance and Sexual Activity  . Alcohol use: No    Alcohol/week: 0.0 oz  . Drug use: No  . Sexual activity: Yes    Birth control/protection:  Post-menopausal, Surgical  Lifestyle  . Physical activity:    Days per week: 7 days    Minutes per session: 30 min  . Stress: Not at all  Relationships  . Social connections:    Talks on phone: Not on file    Gets together: Not on file    Attends religious service: Not on file    Active member of club or organization: Not on file    Attends meetings of clubs or organizations: Not on file    Relationship status: Not on file  Other Topics Concern  . Not on file  Social History Narrative   Lives in [place] with husband. No children. No pets.      Work - retired Sales promotion account executive   Diet - regular   Exercise - golf, gardening    Outpatient Encounter Medications as of 08/31/2017  Medication Sig  . acetaminophen (TYLENOL) 500 MG tablet Take 1,000 mg by mouth every 6 (six) hours as needed (for pain.).  Marland Kitchen cholecalciferol (VITAMIN D) 1000 units tablet Take 1,000 Units by mouth daily.  Marland Kitchen lisinopril (PRINIVIL,ZESTRIL) 10 MG tablet Take 1 tablet (10 mg total) by mouth daily.  Marland Kitchen Lysine 1000 MG TABS Take 1,000 mg by mouth daily as needed (for fever blisters).  . Multiple Vitamins-Minerals (EYE VITAMINS PO) Take by mouth.  . Nutritional Supplements (NUTRITIONAL SUPPLEMENT PO) Take 1 tablet by mouth daily.  . Probiotic Product (PROBIOTIC DAILY PO) Take 1 capsule by mouth daily.   Marland Kitchen Propylene Glycol-Glycerin (SOOTHE) 0.6-0.6 % SOLN Place 1 drop into both eyes daily.  . vitamin B-12 (CYANOCOBALAMIN) 1000 MCG tablet Take 1,000 mcg by mouth daily.   No facility-administered encounter medications on file as of 08/31/2017.     Activities of Daily Living In your present state of health, do you have any difficulty performing the following activities: 08/31/2017  Hearing? N  Vision? N  Difficulty concentrating or making decisions? N  Walking or climbing stairs? N  Dressing or bathing? N  Doing errands, shopping? N  Preparing Food and eating ? N  Using the Toilet? N  In the past six months,  have you accidently leaked urine? N  Do you have problems with loss of bowel control? N  Managing your Medications? N  Managing your Finances? N  Housekeeping or managing your Housekeeping? N  Some recent data might be hidden    Patient Care Team: [name], MD as PCP - General (Family Medicine)    Assessment:   This is a routine wellness examination for Seniya.  The goal of the wellness visit is to assist the patient how to close the gaps in care and create a  preventative care plan for the patient.   The roster of all physicians providing medical care to patient is listed in the Snapshot section of the chart.  Taking calcium VIT D as appropriate/Osteoporosis risk reviewed.    Safety issues reviewed; Smoke and carbon monoxide detectors in the home. No firearms or firearms locked in a safe within the home. Wears seatbelts when driving or riding with others. No violence in the home.  They do not have excessive sun exposure.  Discussed the need for sun protection: hats, long sleeves and the use of sunscreen if there is significant sun exposure.  Patient is alert, normal appearance, oriented to person/place/and time.  Correctly identified the president of the [place] and recalls of 3/3 words. Performs simple calculations and can read correct time from watch face. Displays appropriate judgement.  BMI- discussed the importance of a healthy diet, water intake and the benefits of aerobic exercise. Educational material provided.   24 hour diet recall: Regular diet  Dental- every 6 months.  Eye- Visual acuity not assessed per patient preference since they have regular follow up with the ophthalmologist.  Wears corrective lenses.  Sleep patterns- Sleeps through the night without issues.    Health maintenance gaps- closed.  Patient Concerns: None at this time. Follow up with PCP as needed.  Exercise Activities and Dietary recommendations Current Exercise Habits: Home exercise  routine, Type of exercise: stretching;walking(golf), Frequency (Times/Week): 7, Intensity: Moderate  Goals    . DIET - INCREASE LEAN PROTEINS     Low carb foods    . Increase physical activity       Fall Risk Fall Risk  08/31/2017 08/28/2016 10/24/2015 08/29/2015 07/20/2013  Falls in the past year? Yes No No No No  Number falls in past yr: 1 - - - -  Injury with Fall? No - - - -  Comment Steppped on a rock and rolled her ankle over. She did not seek medical attention. Supported with sports tape.   - - - -  Follow up Education provided;Falls prevention discussed - - - -   Depression Screen PHQ 2/9 Scores 08/31/2017 08/28/2016 10/24/2015 08/29/2015  PHQ - 2 Score 0 0 0 0  PHQ- 9 Score - 0 - -     Cognitive Function MMSE - Mini Mental State Exam 08/31/2017 08/28/2016 08/29/2015  Orientation to time 5 5 5   Orientation to Place 5 5 5   Registration 3 3 3   Attention/ Calculation 5 5 5   Recall 3 2 3   Language- name 2 objects 2 2 2   Language- repeat 1 1 1   Language- follow 3 step command 3 3 3   Language- read & follow direction 1 1 1   Write a sentence 1 1 1   Copy design 1 1 1   Total score 30 29 30         Immunization History  Administered Date(s) Administered  . Influenza, High Dose Seasonal PF 01/16/2017  . Influenza,inj,Quad PF,6+ Mos 02/24/2013, 01/23/2014, 02/28/2015, 01/28/2016  . Pneumococcal Conjugate-13 07/20/2013  . Pneumococcal Polysaccharide-23 07/24/2014  . Td 07/16/2010   Screening Tests Health Maintenance  Topic Date Due  . MAMMOGRAM  09/22/2017  . INFLUENZA VACCINE  11/12/2017  . COLONOSCOPY  10/27/2019  . TETANUS/TDAP  07/15/2020  . DEXA SCAN  Completed  . Hepatitis C Screening  Completed  . PNA vac Low Risk Adult  Completed      Plan:    End of life planning; Advance aging; Advanced directives discussed. Copy of current HCPOA/Living Will  requested.    I have personally reviewed and noted the following in the patient's chart:   . Medical and social  history . Use of alcohol, tobacco or illicit drugs  . Current medications and supplements . Functional ability and status . Nutritional status . Physical activity . Advanced directives . List of other physicians . Hospitalizations, surgeries, and ER visits in previous 12 months . Vitals . Screenings to include cognitive, depression, and falls . Referrals and appointments  In addition, I have reviewed and discussed with patient certain preventive protocols, quality metrics, and best practice recommendations. A written personalized care plan for preventive services as well as general preventive health recommendations were provided to patient.     Varney Biles, LPN  [id number]/[id number]

## 2017-08-31 NOTE — Patient Instructions (Addendum)
  Ms. Troost , Thank you for taking time to come for your Medicare Wellness Visit. I appreciate your ongoing commitment to your health goals. Please review the following plan we discussed and let me know if I can assist you in the future.   Follow up as needed.    Bring a copy of your [place] and/or Living Will to be scanned into chart.  Have a great day!  These are the goals we discussed: Goals    . DIET - INCREASE LEAN PROTEINS     Low carb foods    . Increase physical activity       This is a list of the screening recommended for you and due dates:  Health Maintenance  Topic Date Due  . Mammogram  09/22/2017  . Flu Shot  11/12/2017  . Colon Cancer Screening  10/27/2019  . Tetanus Vaccine  07/15/2020  . DEXA scan (bone density measurement)  Completed  .  Hepatitis C: One time screening is recommended by Center for Disease Control  (CDC) for  adults born from [birth year] through 1964.   Completed  . Pneumonia vaccines  Completed

## 2017-09-11 ENCOUNTER — Ambulatory Visit (INDEPENDENT_AMBULATORY_CARE_PROVIDER_SITE_OTHER): Payer: Medicare Other | Admitting: Family Medicine

## 2017-09-11 ENCOUNTER — Encounter: Payer: Self-pay | Admitting: Family Medicine

## 2017-09-11 VITALS — BP 132/70 | HR 90 | Temp 98.0°F | Resp 15 | Ht 60.25 in | Wt 164.6 lb

## 2017-09-11 DIAGNOSIS — R7309 Other abnormal glucose: Secondary | ICD-10-CM

## 2017-09-11 DIAGNOSIS — S93401A Sprain of unspecified ligament of right ankle, initial encounter: Secondary | ICD-10-CM | POA: Diagnosis not present

## 2017-09-11 DIAGNOSIS — I10 Essential (primary) hypertension: Secondary | ICD-10-CM | POA: Diagnosis not present

## 2017-09-11 LAB — HEMOGLOBIN A1C: Hgb A1c MFr Bld: 6 % (ref 4.6–6.5)

## 2017-09-11 LAB — GLUCOSE, RANDOM: Glucose, Bld: 112 mg/dL — ABNORMAL HIGH (ref 70–99)

## 2017-09-11 NOTE — Assessment & Plan Note (Signed)
Needs fasting glucose and A1c checked.

## 2017-09-11 NOTE — Progress Notes (Signed)
  [name], MD Phone: [phone]  Katherine Stanley is a 72 y.o. female who presents today for f/u.  CC: htn  HYPERTENSION  Disease Monitoring  Home BP Monitoring 116/80 Chest pain- no    Dyspnea- no Medications  Compliance-  Taking lisinopril.  Edema- no  Patient's glucose found to be slightly elevated on most recent check.  She has been intermittently checking it at home and it was 115 this morning.  No polyuria or polydipsia.  She twisted her right ankle 2 weeks ago.  She inverted it.  She had no pain.  It did swell up.  She fell onto her left side onto her ribs.  She noted no significant injury there.  Slight discomfort when her breast bounce into her left ribs though that has been improving.  No loss of consciousness or head injury.  She used ice and compression.    Social History   Tobacco Use  Smoking Status Never Smoker  Smokeless Tobacco Never Used     ROS see history of present illness  Objective  Physical Exam Vitals:   09/11/17 0842  BP: 132/70  Pulse: 90  Resp: 15  Temp: 98 F (36.7 C)  SpO2: 97%    BP Readings from Last 3 Encounters:  09/11/17 132/70  08/31/17 120/82  02/02/17 (!) 145/81   Wt Readings from Last 3 Encounters:  09/11/17 164 lb 9.6 oz (74.7 kg)  08/31/17 167 lb (75.8 kg)  02/02/17 165 lb (74.8 kg)    Physical Exam  Constitutional: No distress.  Cardiovascular: Normal rate, regular rhythm and normal heart sounds.  Pulmonary/Chest: Effort normal and breath sounds normal.  Musculoskeletal: She exhibits no edema.  Slight soft tissue swelling lateral portion of right ankle, no malleoli or tenderness laterally or medially on the right ankle, no navicular tenderness, no fifth head of metatarsal tenderness, foot is warm and well-perfused, 2+ DP pulse in the right, no tenderness of the left ankle or swelling of the left ankle  Neurological: She is alert.  Skin: Skin is warm and dry. She is not diaphoretic.     Assessment/Plan:  Please see individual problem list.  Hypertension Well-controlled.  Continue current regimen.  Elevated glucose Needs fasting glucose and A1c checked.  Right ankle sprain Doing fairly well.  She will continue to monitor.   Orders Placed This Encounter  Procedures  . Glucose  . HgB A1c    No orders of the defined types were placed in this encounter.    [name], MD [place]

## 2017-09-11 NOTE — Patient Instructions (Signed)
Nice to see you. We will check your glucose and A1c today. Please monitor your right ankle if the swelling does not continue to improve please let us know.

## 2017-09-11 NOTE — Assessment & Plan Note (Signed)
Doing fairly well.  She will continue to monitor.

## 2017-09-11 NOTE — Assessment & Plan Note (Signed)
Well-controlled.  Continue current regimen. 

## 2017-09-25 ENCOUNTER — Ambulatory Visit
Admission: RE | Admit: 2017-09-25 | Discharge: 2017-09-25 | Disposition: A | Discharge status: Home / Self Care | Payer: Medicare Other | Source: Ambulatory Visit | Attending: Family Medicine | Admitting: Family Medicine

## 2017-09-25 DIAGNOSIS — Z1231 Encounter for screening mammogram for malignant neoplasm of breast: Secondary | ICD-10-CM | POA: Insufficient documentation

## 2017-12-28 ENCOUNTER — Other Ambulatory Visit: Payer: Self-pay | Admitting: Family Medicine

## 2017-12-28 DIAGNOSIS — I10 Essential (primary) hypertension: Secondary | ICD-10-CM

## 2018-02-19 ENCOUNTER — Ambulatory Visit (INDEPENDENT_AMBULATORY_CARE_PROVIDER_SITE_OTHER): Payer: Medicare Other | Admitting: *Deleted

## 2018-02-19 DIAGNOSIS — Z23 Encounter for immunization: Secondary | ICD-10-CM

## 2018-03-19 ENCOUNTER — Ambulatory Visit (INDEPENDENT_AMBULATORY_CARE_PROVIDER_SITE_OTHER): Payer: Medicare Other | Admitting: Family Medicine

## 2018-03-19 ENCOUNTER — Encounter: Payer: Self-pay | Admitting: Family Medicine

## 2018-03-19 DIAGNOSIS — R7303 Prediabetes: Secondary | ICD-10-CM | POA: Insufficient documentation

## 2018-03-19 DIAGNOSIS — M8580 Other specified disorders of bone density and structure, unspecified site: Secondary | ICD-10-CM

## 2018-03-19 DIAGNOSIS — I10 Essential (primary) hypertension: Secondary | ICD-10-CM | POA: Diagnosis not present

## 2018-03-19 LAB — BASIC METABOLIC PANEL
BUN: 24 mg/dL — AB (ref 6–23)
CHLORIDE: 107 meq/L (ref 96–112)
CO2: 22 meq/L (ref 19–32)
Calcium: 9.6 mg/dL (ref 8.4–10.5)
Creatinine, Ser: 0.65 mg/dL (ref 0.40–1.20)
GFR: 95.46 mL/min (ref >60.00)
Glucose, Bld: 113 mg/dL — ABNORMAL HIGH (ref 70–99)
POTASSIUM: 4.2 meq/L (ref 3.5–5.1)
Sodium: 139 mEq/L (ref 135–145)

## 2018-03-19 LAB — HEMOGLOBIN A1C: Hgb A1c MFr Bld: 5.8 % (ref 4.6–6.5)

## 2018-03-19 NOTE — Assessment & Plan Note (Signed)
Well-controlled.  Continue current regimen.  Check BMP. 

## 2018-03-19 NOTE — Assessment & Plan Note (Signed)
Encouraged continued vitamin D supplementation.  Encouraged adequate dietary calcium intake.  Plan for DEXA scan next year.

## 2018-03-19 NOTE — Assessment & Plan Note (Signed)
Check A1c.  Continue to stay active and monitor her diet.

## 2018-03-19 NOTE — Progress Notes (Signed)
  [name], MD Phone: [phone]  Katherine Stanley is a 72 y.o. female who presents today for f/u.  CC: htn, prediabetes, osteopenia  HYPERTENSION  Disease Monitoring  Home BP Monitoring 120-132/70s Chest pain- no    Dyspnea- no Medications  Compliance-  Taking lisinopril.  Edema- no  Prediabetes: Occasionally checks her sugar.  Typically 110-120.  No polyuria or polydipsia.  She does try to stay active by playing golf.  She gets 2 to 3 miles of walking when she does play golf.  She was eating some of the wrong stuff though is gone on her sugar fast recently.  Osteopenia: Taking calcium and vitamin D.  She does occasionally drink milk and eat yogurt.  Does eat cheese.  No fractures.  She will be due for a DEXA scan the middle of 2020.    Social History   Tobacco Use  Smoking Status Never Smoker  Smokeless Tobacco Never Used     ROS see history of present illness  Objective  Physical Exam Vitals:   03/19/18 0822  BP: 132/82  Pulse: 90  Resp: 16  Temp: 98.5 F (36.9 C)  SpO2: 97%    BP Readings from Last 3 Encounters:  03/19/18 132/82  09/11/17 132/70  08/31/17 120/82   Wt Readings from Last 3 Encounters:  03/19/18 166 lb 9.6 oz (75.6 kg)  09/11/17 164 lb 9.6 oz (74.7 kg)  08/31/17 167 lb (75.8 kg)    Physical Exam  Constitutional: No distress.  Cardiovascular: Normal rate, regular rhythm and normal heart sounds.  Pulmonary/Chest: Effort normal and breath sounds normal.  Musculoskeletal: She exhibits no edema.  Neurological: She is alert.  Skin: Skin is warm and dry. She is not diaphoretic.     Assessment/Plan: Please see individual problem list.  Hypertension Well-controlled.  Continue current regimen.  Check BMP.  Osteopenia Encouraged continued vitamin D supplementation.  Encouraged adequate dietary calcium intake.  Plan for DEXA scan next year.  Prediabetes Check A1c.  Continue to stay active and monitor her diet.    Orders Placed  This Encounter  Procedures  . HgB A1c  . Basic Metabolic Panel (BMET)    No orders of the defined types were placed in this encounter.    [name], MD [place]

## 2018-03-19 NOTE — Patient Instructions (Signed)
Nice to see you. Please try to monitor your diet and stay active.  Please try to increase dairy intake to get adequate calcium. Please continue to monitor your blood pressure. We will get labs and contact you with the results.

## 2018-03-31 DIAGNOSIS — H543 Unqualified visual loss, both eyes: Secondary | ICD-10-CM | POA: Diagnosis not present

## 2018-04-30 DIAGNOSIS — H04123 Dry eye syndrome of bilateral lacrimal glands: Secondary | ICD-10-CM | POA: Diagnosis not present

## 2018-05-03 ENCOUNTER — Ambulatory Visit (INDEPENDENT_AMBULATORY_CARE_PROVIDER_SITE_OTHER): Payer: Medicare Other

## 2018-05-03 ENCOUNTER — Encounter (INDEPENDENT_AMBULATORY_CARE_PROVIDER_SITE_OTHER): Payer: Self-pay | Admitting: Orthopaedic Surgery

## 2018-05-03 ENCOUNTER — Other Ambulatory Visit (INDEPENDENT_AMBULATORY_CARE_PROVIDER_SITE_OTHER): Payer: Self-pay | Admitting: *Deleted

## 2018-05-03 ENCOUNTER — Ambulatory Visit (INDEPENDENT_AMBULATORY_CARE_PROVIDER_SITE_OTHER): Payer: Medicare Other | Admitting: Orthopaedic Surgery

## 2018-05-03 VITALS — BP 132/88 | HR 101

## 2018-05-03 DIAGNOSIS — M25552 Pain in left hip: Secondary | ICD-10-CM

## 2018-05-03 NOTE — Progress Notes (Signed)
amb refer

## 2018-05-03 NOTE — Progress Notes (Signed)
Office Visit Note   Patient: Katherine Stanley           Date of Birth: 1945-06-18           MRN: [id number] Visit Date: 05/03/2018              Requested by: [name], MD [address] [place], [place] [address] PCP: [name], MD   Assessment & Plan: Visit Diagnoses:  1. Pain in left hip   2. Pain of left hip joint     Plan: Osteoarthritis left hip.  Long discussion.  Will order cortisone injection left hip at [facility] hospital  Follow-Up Instructions: Return in about 1 month (around 06/03/2018).   Orders:  Orders Placed This Encounter  Procedures  . XR HIP UNILAT W OR W/O PELVIS 2-3 VIEWS LEFT  . XR Lumbar Spine 2-3 Views   No orders of the defined types were placed in this encounter.     Procedures: No procedures performed   Clinical Data: No additional findings.   Subjective: Chief Complaint  Patient presents with  . Left Hip - Pain  . Hip Pain    pt stated having pain front and lateral side of the hip for 6 months.  Mrs. Gilkey noted insidious onset of left hip pain approximately 6 months ago.  No history of injury or trauma.  She is had difficulty trying to find a comfortable position at night and with certain motions.  She is not having any specific back pain.  She denies any numbness or tingling to either lower extremity.  Pain is localized in the left groin and lateral left hip  HPI  Review of Systems  Constitutional: Negative.   HENT: Negative.   Eyes: Positive for visual disturbance.  Respiratory: Negative.   Cardiovascular: Negative.   Gastrointestinal: Negative.   Endocrine: Negative.   Genitourinary: Negative.   Musculoskeletal: Positive for back pain.  Skin: Negative.   Neurological: Negative.   Hematological: Negative.   Psychiatric/Behavioral: Negative.      Objective: Vital Signs: BP 132/88   Pulse (!) 101   Physical Exam Constitutional:      Appearance: She is well-developed.  Eyes:       Pupils: Pupils are equal, round, and reactive to light.  Pulmonary:     Effort: Pulmonary effort is normal.  Skin:    General: Skin is warm and dry.  Neurological:     Mental Status: She is alert and oriented to person, place, and time.  Psychiatric:        Behavior: Behavior normal.     Ortho Exam awake alert and oriented x3.  Comfortable sitting.  Very minimal loss of motion with internal and external rotation left hip but with pain on extreme of internal and external rotation in the left groin and lateral left hip.  Straight leg raise negative.  No pain over the greater trochanter or lateral hip no percussible tenderness lumbar spine.  Motor exam intact  Specialty Comments:  No specialty comments available.  Imaging: Xr Hip Unilat W Or W/o Pelvis 2-3 Views Left  Result Date: 05/03/2018 AP pelvis and lateral left hip demonstrate osteoarthritis with narrowing of the hip joint, subchondral cysts and sclerosis and peripheral osteophytes.  The very minimal changes on the right  Xr Lumbar Spine 2-3 Views  Result Date: 05/03/2018 The lumbar spine reveals 6 nonrib-bearing lumbar vertebrae.  No listhesis.  Slight narrowing of the L6-S1.  Minimal facet arthritis disc  space    PMFS History: Patient Active Problem List   Diagnosis Date Noted  . Prediabetes 03/19/2018  . Unilateral primary osteoarthritis, right knee 03/18/2016  . S/P total knee replacement using cement, right 03/18/2016  . 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 Osteopenia 08/23/2012  . 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 Hypertension 10/27/2011  . Rosacea    Past Medical History:  Diagnosis Date  . Arthritis   . Diverticulosis   . Headache    occasionally  . Hypertension    takes Lisinopril daily  . Joint pain   . Osteopenia   . Rosacea     Family History  Problem Relation Age of Onset  . Hypertension Mother   . Heart disease Mother   . Kidney disease Mother   . Diabetes Father   .  Hypertension Father   . Heart disease Father   . Heart disease Brother   . Cancer Maternal Aunt   . Ovarian cancer Maternal Aunt 85  . Stroke Brother   . Alzheimer's disease Brother   . Diabetes Paternal Grandmother   . Breast cancer Neg Hx     Past Surgical History:  Procedure Laterality Date  . CHOLECYSTECTOMY    . CHOLECYSTECTOMY    . COLONOSCOPY    . EYE SURGERY  2013   LASIK  . REFRACTIVE SURGERY    . SHOULDER ARTHROSCOPY WITH OPEN ROTATOR CUFF REPAIR AND DISTAL CLAVICLE ACROMINECTOMY Right 09/19/2014   Procedure: SHOULDER ARTHROSCOPY WITH MINI-OPEN ROTATOR CUFF REPAIR AND DISTAL CLAVICLE RESECTION, SUBACROMIAL DECOMPRESSION;  Surgeon: Garald Balding, MD;  Location: [facility];  Service: Orthopedics;  Laterality: Right;  . TONSILLECTOMY  at age 57  . 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 TOTAL KNEE ARTHROPLASTY Right 03/18/2016   Procedure: TOTAL KNEE ARTHROPLASTY;  Surgeon: Garald Balding, MD;  Location: [facility];  Service: Orthopedics;  Laterality: Right;  Marland Kitchen VAGINAL HYSTERECTOMY  at age 47   Social History   Occupational History  . Not on file  Tobacco Use  . Smoking status: Never Smoker  . Smokeless tobacco: Never Used  Substance and Sexual Activity  . Alcohol use: No    Alcohol/week: 0.0 standard drinks  . Drug use: No  . Sexual activity: Yes    Birth control/protection: Post-menopausal, Surgical

## 2018-05-12 ENCOUNTER — Telehealth (INDEPENDENT_AMBULATORY_CARE_PROVIDER_SITE_OTHER): Payer: Self-pay | Admitting: Orthopaedic Surgery

## 2018-05-12 NOTE — Telephone Encounter (Signed)
I called patient, appt 05/18/2018 at 9:00 at [facility], patient wants to change date, I gave patient # to centralized scheduling to reschedule appt.

## 2018-05-12 NOTE — Telephone Encounter (Signed)
Patient called checking on the status of her injection for her left hip.  Patient requested a return call.

## 2018-05-14 ENCOUNTER — Telehealth (INDEPENDENT_AMBULATORY_CARE_PROVIDER_SITE_OTHER): Payer: Self-pay | Admitting: Orthopaedic Surgery

## 2018-05-14 NOTE — Telephone Encounter (Signed)
Patient called stating she is scheduled for an injection at [facility] on 05/20/18 and is asking if she can receive the results over the phone rather than scheduling an appointment due to her schedule.

## 2018-05-18 ENCOUNTER — Ambulatory Visit: Payer: Medicare Other

## 2018-05-19 ENCOUNTER — Ambulatory Visit (HOSPITAL_COMMUNITY): Payer: Medicare Other

## 2018-05-20 ENCOUNTER — Ambulatory Visit
Admission: RE | Admit: 2018-05-20 | Discharge: 2018-05-20 | Disposition: A | Discharge status: Home / Self Care | Payer: Medicare Other | Source: Ambulatory Visit | Attending: Orthopaedic Surgery | Admitting: Orthopaedic Surgery

## 2018-05-20 DIAGNOSIS — M25552 Pain in left hip: Secondary | ICD-10-CM | POA: Insufficient documentation

## 2018-05-20 MED ORDER — IOPAMIDOL (ISOVUE-300) INJECTION 61%
5.0000 mL | Freq: Once | INTRAVENOUS | Status: AC | PRN
  Administered 2018-05-20: 5 mL

## 2018-05-20 MED ORDER — METHYLPREDNISOLONE ACETATE 40 MG/ML INJ SUSP (RADIOLOG
120.0000 mg | Freq: Once | INTRAMUSCULAR | Status: AC
  Administered 2018-05-20: 80 mg via EPIDURAL

## 2018-05-20 MED ORDER — LIDOCAINE HCL (PF) 1 % IJ SOLN
5.0000 mL | Freq: Once | INTRAMUSCULAR | Status: AC
  Administered 2018-05-20: 5 mL
  Filled 2018-05-20: qty 5

## 2018-05-20 MED ORDER — BUPIVACAINE HCL (PF) 0.25 % IJ SOLN
15.0000 mL | Freq: Once | INTRAMUSCULAR | Status: AC
  Administered 2018-05-20: 7 mL
  Filled 2018-05-20: qty 20

## 2018-05-20 NOTE — Telephone Encounter (Signed)
Please advise 

## 2018-05-20 NOTE — Telephone Encounter (Signed)
Called and spoke with patient. She had her injection today. She will let us know how she is doing in 1-2weeks. If no better or having any problems she will call to schedule an appointment.

## 2018-05-20 NOTE — Telephone Encounter (Signed)
Please call patient

## 2018-05-20 NOTE — Telephone Encounter (Signed)
Call pt and let her know that there are results from inj-but would like to know if she is better 1-2 weeks after the injection-thanks

## 2018-06-04 ENCOUNTER — Encounter (INDEPENDENT_AMBULATORY_CARE_PROVIDER_SITE_OTHER): Payer: Self-pay | Admitting: Orthopaedic Surgery

## 2018-06-14 DIAGNOSIS — L918 Other hypertrophic disorders of the skin: Secondary | ICD-10-CM | POA: Diagnosis not present

## 2018-06-14 DIAGNOSIS — D2239 Melanocytic nevi of other parts of face: Secondary | ICD-10-CM | POA: Diagnosis not present

## 2018-06-14 DIAGNOSIS — Z1283 Encounter for screening for malignant neoplasm of skin: Secondary | ICD-10-CM | POA: Diagnosis not present

## 2018-06-14 DIAGNOSIS — L821 Other seborrheic keratosis: Secondary | ICD-10-CM | POA: Diagnosis not present

## 2018-06-14 DIAGNOSIS — D1800 Hemangioma unspecified site: Secondary | ICD-10-CM | POA: Diagnosis not present

## 2018-06-14 DIAGNOSIS — L812 Freckles: Secondary | ICD-10-CM | POA: Diagnosis not present

## 2018-06-14 DIAGNOSIS — L219 Seborrheic dermatitis, unspecified: Secondary | ICD-10-CM | POA: Diagnosis not present

## 2018-06-14 DIAGNOSIS — L820 Inflamed seborrheic keratosis: Secondary | ICD-10-CM | POA: Diagnosis not present

## 2018-08-27 ENCOUNTER — Telehealth: Payer: Self-pay | Admitting: *Deleted

## 2018-08-27 DIAGNOSIS — R7303 Prediabetes: Secondary | ICD-10-CM

## 2018-08-27 DIAGNOSIS — I10 Essential (primary) hypertension: Secondary | ICD-10-CM

## 2018-08-27 NOTE — Telephone Encounter (Signed)
Please place future lab orders for appt next week 

## 2018-08-31 ENCOUNTER — Other Ambulatory Visit: Payer: Self-pay | Admitting: Family Medicine

## 2018-08-31 DIAGNOSIS — Z1231 Encounter for screening mammogram for malignant neoplasm of breast: Secondary | ICD-10-CM

## 2018-09-01 ENCOUNTER — Other Ambulatory Visit: Payer: Self-pay

## 2018-09-01 ENCOUNTER — Ambulatory Visit: Payer: Medicare Other

## 2018-09-01 ENCOUNTER — Ambulatory Visit: Payer: Medicare Other | Admitting: Family Medicine

## 2018-09-01 ENCOUNTER — Other Ambulatory Visit (INDEPENDENT_AMBULATORY_CARE_PROVIDER_SITE_OTHER): Payer: Medicare Other

## 2018-09-01 DIAGNOSIS — R7303 Prediabetes: Secondary | ICD-10-CM

## 2018-09-01 DIAGNOSIS — I10 Essential (primary) hypertension: Secondary | ICD-10-CM | POA: Diagnosis not present

## 2018-09-01 LAB — COMPREHENSIVE METABOLIC PANEL
ALT: 21 U/L (ref 0–35)
AST: 19 U/L (ref 0–37)
Albumin: 4.3 g/dL (ref 3.5–5.2)
Alkaline Phosphatase: 69 U/L (ref 39–117)
BUN: 19 mg/dL (ref 6–23)
CO2: 24 mEq/L (ref 19–32)
Calcium: 9.6 mg/dL (ref 8.4–10.5)
Chloride: 106 mEq/L (ref 96–112)
Creatinine, Ser: 0.66 mg/dL (ref 0.40–1.20)
GFR: 88.14 mL/min (ref >60.00)
Glucose, Bld: 102 mg/dL — ABNORMAL HIGH (ref 70–99)
Potassium: 4.4 mEq/L (ref 3.5–5.1)
Sodium: 139 mEq/L (ref 135–145)
Total Bilirubin: 0.4 mg/dL (ref 0.2–1.2)
Total Protein: 7 g/dL (ref 6.0–8.3)

## 2018-09-01 LAB — LIPID PANEL
Cholesterol: 159 mg/dL (ref 0–200)
HDL: 56.1 mg/dL (ref >39.00)
LDL Cholesterol: 66 mg/dL (ref 0–99)
NonHDL: 102.76
Total CHOL/HDL Ratio: 3
Triglycerides: 186 mg/dL — ABNORMAL HIGH (ref 0.0–149.0)
VLDL: 37.2 mg/dL (ref 0.0–40.0)

## 2018-09-01 LAB — HEMOGLOBIN A1C: Hgb A1c MFr Bld: 6.1 % (ref 4.6–6.5)

## 2018-09-03 ENCOUNTER — Ambulatory Visit: Payer: Medicare Other | Admitting: Family Medicine

## 2018-09-03 ENCOUNTER — Other Ambulatory Visit: Payer: Self-pay

## 2018-09-03 ENCOUNTER — Ambulatory Visit (INDEPENDENT_AMBULATORY_CARE_PROVIDER_SITE_OTHER): Payer: Medicare Other

## 2018-09-03 VITALS — BP 126/82 | HR 84 | Temp 98.4°F | Wt 164.0 lb

## 2018-09-03 DIAGNOSIS — Z0000 Encounter for general adult medical examination without abnormal findings: Secondary | ICD-10-CM

## 2018-09-03 NOTE — Progress Notes (Signed)
Subjective:   RINDA ROLLYSON is a 73 y.o. female who presents for Medicare Annual (Subsequent) preventive examination.  Review of Systems:  No ROS.  Medicare Wellness Virtual Visit.  Visual/audio telehealth visit.  Vital signs provided per patient.  See social history for additional risk factors.  Cardiac Risk Factors include: advanced age (>47men, >20 women);hypertension     Objective:     Vitals: BP 126/82 (BP Location: Left Arm, Patient Position: Sitting)   Pulse 84   Temp 98.4 F (36.9 C) (Oral)   Wt 164 lb (74.4 kg)   SpO2 95%   BMI 32.03 kg/m   Body mass index is 32.03 kg/m.  Advanced Directives 09/03/2018 08/31/2017 08/28/2016 04/02/2016 03/18/2016 03/18/2016 03/12/2016  Does Patient Have a Medical Advance Directive? Yes Yes Yes Yes Yes Yes Yes  Type of Paramedic of [place];Living will [place];Living will [place];Living will - Healthcare Power of [place]  Does patient want to make changes to medical advance directive? No - Patient declined No - Patient declined No - Patient declined - No - Patient declined - -  Copy of [place] in Chart? No - copy requested No - copy requested No - copy requested - No - copy requested No - copy requested No - copy requested    Tobacco Social History   Tobacco Use  Smoking Status Never Smoker  Smokeless Tobacco Never Used     Counseling given: Not Answered   Clinical Intake:  Pre-visit preparation completed: Yes        Diabetes: No  How often do you need to have someone help you when you read instructions, pamphlets, or other written materials from your doctor or pharmacy?: 1 - Never  Interpreter Needed?: No     Past Medical History:  Diagnosis Date  . Arthritis   . Diverticulosis   . Headache    occasionally  . Hypertension    takes Lisinopril daily  . Joint pain    . Osteopenia   . Rosacea    Past Surgical History:  Procedure Laterality Date  . CHOLECYSTECTOMY    . CHOLECYSTECTOMY    . COLONOSCOPY    . EYE SURGERY  2013   LASIK  . REFRACTIVE SURGERY    . SHOULDER ARTHROSCOPY WITH OPEN ROTATOR CUFF REPAIR AND DISTAL CLAVICLE ACROMINECTOMY Right 09/19/2014   Procedure: SHOULDER ARTHROSCOPY WITH MINI-OPEN ROTATOR CUFF REPAIR AND DISTAL CLAVICLE RESECTION, SUBACROMIAL DECOMPRESSION;  Surgeon: Garald Balding, MD;  Location: [place];  Service: Orthopedics;  Laterality: Right;  . TONSILLECTOMY  at age 60  . 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 TOTAL KNEE ARTHROPLASTY Right 03/18/2016   Procedure: TOTAL KNEE ARTHROPLASTY;  Surgeon: Garald Balding, MD;  Location: [place];  Service: Orthopedics;  Laterality: Right;  Marland Kitchen VAGINAL HYSTERECTOMY  at age 46   Family History  Problem Relation Age of Onset  . Hypertension Mother   . Heart disease Mother   . Kidney disease Mother   . Diabetes Father   . Hypertension Father   . Heart disease Father   . Heart disease Brother   . Cancer Maternal Aunt   . Ovarian cancer Maternal Aunt 85  . Stroke Brother   . Alzheimer's disease Brother   . Diabetes Paternal Grandmother   . Breast cancer Neg Hx    Social History   Socioeconomic History  . Marital status: Married  Spouse name: Not on file  . Number of children: Not on file  . Years of education: Not on file  . Highest education level: Not on file  Occupational History  . Not on file  Social Needs  . Financial resource strain: Not hard at all  . Food insecurity:    Worry: Never true    Inability: Never true  . Transportation needs:    Medical: No    Non-medical: No  Tobacco Use  . Smoking status: Never Smoker  . Smokeless tobacco: Never Used  Substance and Sexual Activity  . Alcohol use: No    Alcohol/week: 0.0 standard drinks  . Drug use: No  . Sexual activity: Yes    Birth control/protection: Post-menopausal, Surgical  Lifestyle  .  Physical activity:    Days per week: 7 days    Minutes per session: 30 min  . Stress: Not at all  Relationships  . Social connections:    Talks on phone: Not on file    Gets together: Not on file    Attends religious service: Not on file    Active member of club or organization: Not on file    Attends meetings of clubs or organizations: Not on file    Relationship status: Not on file  Other Topics Concern  . Not on file  Social History Narrative   Lives in [place] with husband. No children. No pets.      Work - retired Sales promotion account executive   Diet - regular   Exercise - golf, gardening    Outpatient Encounter Medications as of 09/03/2018  Medication Sig  . acetaminophen (TYLENOL) 500 MG tablet Take 1,000 mg by mouth every 6 (six) hours as needed (for pain.).  Marland Kitchen cholecalciferol (VITAMIN D) 1000 units tablet Take 1,000 Units by mouth daily.  Marland Kitchen lisinopril (PRINIVIL,ZESTRIL) 10 MG tablet TAKE 1 TABLET BY MOUTH ONCE DAILY  . Lysine 1000 MG TABS Take 1,000 mg by mouth daily as needed (for fever blisters).  . Multiple Vitamins-Minerals (EYE VITAMINS PO) Take by mouth.  . Nutritional Supplements (NUTRITIONAL SUPPLEMENT PO) Take 1 tablet by mouth daily.  . Probiotic Product (PROBIOTIC DAILY PO) Take 1 capsule by mouth daily.   Marland Kitchen Propylene Glycol-Glycerin (SOOTHE) 0.6-0.6 % SOLN Place 1 drop into both eyes daily.  . vitamin B-12 (CYANOCOBALAMIN) 1000 MCG tablet Take 1,000 mcg by mouth daily.   No facility-administered encounter medications on file as of 09/03/2018.     Activities of Daily Living In your present state of health, do you have any difficulty performing the following activities: 09/03/2018  Hearing? N  Vision? N  Difficulty concentrating or making decisions? N  Walking or climbing stairs? N  Dressing or bathing? N  Doing errands, shopping? N  Preparing Food and eating ? N  Using the Toilet? N  In the past six months, have you accidently leaked urine? N  Do you have  problems with loss of bowel control? N  Managing your Medications? N  Managing your Finances? N  Housekeeping or managing your Housekeeping? N  Some recent data might be hidden    Patient Care Team: [name], MD as PCP - General (Family Medicine)    Assessment:   This is a routine wellness examination for Shanetha.  I connected with patient 09/03/18 at 11:00 AM EDT by a video/audio enabled telemedicine application and verified that I am speaking with the correct person using two identifiers. Patient stated full name and DOB. Patient gave  permission to continue with virtual visit. Patient's location was at home and Nurse's location was at [place] office.   Health Screenings  Mammogram - 09/2018 Colonoscopy - 10/2009 Bone Density - 11/2016 Glaucoma -none Hearing -demonstrates normal hearing during visit. Hemoglobin A1C - 08/2018 Cholesterol - 08/2018 Dental- UTD Vision- visits within the last 12 months.  Social  Alcohol intake - no        Smoking history- never    Smokers in home? none Illicit drug use? none Exercise - elliptical 3 days, 5 minutes Diet - regular  Sexually Active -yes BMI- discussed the importance of a healthy diet, water intake and the benefits of aerobic exercise.  Educational material provided.   Safety  Patient feels safe at home- yes Patient does have smoke detectors at home- yes Patient does wear sunscreen or protective clothing when in direct sunlight -yes Patient does wear seat belt when in a moving vehicle -yes  Covid-19 precautions and sickness symptoms discussed.   Activities of Daily Living Patient denies needing assistance with: driving, household chores, feeding themselves, getting from bed to chair, getting to the toilet, bathing/showering, dressing, managing money, or preparing meals.  No new identified risk were noted.    Depression Screen Patient denies losing interest in daily life, feeling hopeless, or crying easily over simple  problems.   Medication-taking as directed and without issues.   Fall Screen Patient denies being afraid of falling or falling in the last year.   Memory Screen Patient is alert.  Patient denies difficulty focusing, concentrating or misplacing items. Correctly identified the president of the [place], season and recall. Patient likes to read, golf and play word search for brain stimulation.  Immunizations The following Immunizations were discussed: Influenza, shingles, pneumonia, and tetanus.   Other Providers Patient Care Team: [name], MD as PCP - General (Family Medicine)  Exercise Activities and Dietary recommendations Current Exercise Habits: Home exercise routine, Type of exercise: calisthenics(elliptical), Frequency (Times/Week): 3, Intensity: Mild  Goals      Patient Stated   . DIET - INCREASE LEAN PROTEINS (pt-stated)     Low carb diet       Fall Risk Fall Risk  09/03/2018 08/31/2017 08/28/2016 10/24/2015 08/29/2015  Falls in the past year? 0 Yes No No No  Number falls in past yr: - 1 - - -  Injury with Fall? - No - - -  Comment - Steppped on a rock and rolled her ankle over. She did not seek medical attention. Supported with sports tape.   - - -  Follow up - Education provided;Falls prevention discussed - - -   Depression Screen PHQ 2/9 Scores 09/03/2018 08/31/2017 08/28/2016 10/24/2015  PHQ - 2 Score 0 0 0 0  PHQ- 9 Score - - 0 -     Cognitive Function MMSE - Mini Mental State Exam 08/31/2017 08/28/2016 08/29/2015  Orientation to time 5 5 5   Orientation to Place 5 5 5   Registration 3 3 3   Attention/ Calculation 5 5 5   Recall 3 2 3   Language- name 2 objects 2 2 2   Language- repeat 1 1 1   Language- follow 3 step command 3 3 3   Language- read & follow direction 1 1 1   Write a sentence 1 1 1   Copy design 1 1 1   Total score 30 29 30      6CIT Screen 09/03/2018  What Year? 0 points  What month? 0 points  What time? 0 points  Count back from 20  0 points   Months in reverse 0 points  Repeat phrase 0 points  Total Score 0    Immunization History  Administered Date(s) Administered  . Influenza, High Dose Seasonal PF 01/16/2017, 02/19/2018  . Influenza,inj,Quad PF,6+ Mos 02/24/2013, 01/23/2014, 02/28/2015, 01/28/2016  . Pneumococcal Conjugate-13 07/20/2013  . Pneumococcal Polysaccharide-23 07/24/2014  . Td 07/16/2010   Screening Tests Health Maintenance  Topic Date Due  . MAMMOGRAM  09/26/2018  . INFLUENZA VACCINE  11/13/2018  . DEXA SCAN  11/15/2018  . COLONOSCOPY  10/27/2019  . TETANUS/TDAP  07/15/2020  . Hepatitis C Screening  Completed  . PNA vac Low Risk Adult  Completed      Plan:    End of life planning; Advance aging; Advanced directives discussed.  Copy of current HCPOA/Living Will requested.    I have personally reviewed and noted the following in the patient's chart:   . Medical and social history . Use of alcohol, tobacco or illicit drugs  . Current medications and supplements . Functional ability and status . Nutritional status . Physical activity . Advanced directives . List of other physicians . Hospitalizations, surgeries, and ER visits in previous 12 months . Vitals . Screenings to include cognitive, depression, and falls . Referrals and appointments  In addition, I have reviewed and discussed with patient certain preventive protocols, quality metrics, and best practice recommendations. A written personalized care plan for preventive services as well as general preventive health recommendations were provided to patient.     Varney Biles, LPN  [id number]/5320   Agree with plan. Mable Paris, NP

## 2018-09-03 NOTE — Patient Instructions (Addendum)
  Katherine Stanley , Thank you for taking time to come for your Medicare Wellness Visit. I appreciate your ongoing commitment to your health goals. Please review the following plan we discussed and let me know if I can assist you in the future.   These are the goals we discussed: Goals      Patient Stated   . DIET - INCREASE LEAN PROTEINS (pt-stated)     Low carb diet       This is a list of the screening recommended for you and due dates:  Health Maintenance  Topic Date Due  . Mammogram  09/26/2018  . Flu Shot  11/13/2018  . DEXA scan (bone density measurement)  11/15/2018  . Colon Cancer Screening  10/27/2019  . Tetanus Vaccine  07/15/2020  .  Hepatitis C: One time screening is recommended by Center for Disease Control  (CDC) for  adults born from [birth year] through 1964.   Completed  . Pneumonia vaccines  Completed

## 2018-09-10 ENCOUNTER — Other Ambulatory Visit: Payer: Self-pay

## 2018-09-10 ENCOUNTER — Encounter: Payer: Self-pay | Admitting: Family Medicine

## 2018-09-10 ENCOUNTER — Ambulatory Visit (INDEPENDENT_AMBULATORY_CARE_PROVIDER_SITE_OTHER): Payer: Medicare Other | Admitting: Family Medicine

## 2018-09-10 DIAGNOSIS — L719 Rosacea, unspecified: Secondary | ICD-10-CM

## 2018-09-10 DIAGNOSIS — I10 Essential (primary) hypertension: Secondary | ICD-10-CM | POA: Diagnosis not present

## 2018-09-10 DIAGNOSIS — R7303 Prediabetes: Secondary | ICD-10-CM | POA: Diagnosis not present

## 2018-09-10 NOTE — Progress Notes (Signed)
Virtual Visit via video Note  This visit type was conducted due to national recommendations for restrictions regarding the COVID-19 pandemic (e.g. social distancing).  This format is felt to be most appropriate for this patient at this time.  All issues noted in this document were discussed and addressed.  No physical exam was performed (except for noted visual exam findings with Video Visits).   I connected with Katherine Stanley today at  2:45 PM EDT by a video enabled telemedicine application and verified that I am speaking with the correct person using two identifiers. Location patient: home Location provider: work Persons participating in the virtual visit: patient, provider, Shamica Moree (husband)  I discussed the limitations, risks, security and privacy concerns of performing an evaluation and management service by telephone and the availability of in person appointments. I also discussed with the patient that there may be a patient responsible charge related to this service. The patient expressed understanding and agreed to proceed.   Reason for visit: follow-up  HPI: Hypertension: Typically running in the 120s over 58s.  Taking lisinopril.  No chest pain, shortness of breath, or edema.  Prediabetes: Recent A1c 6.1.  CBGs have been running around 110.  No polyuria or polydipsia.  She has been walking up and down steps at her home and playing golf 2 to 3 days a week.  She tries to eat greens and salads though does get eggs, bacon, and toast for breakfast.  She does eat soup fairly frequently.  Rosacea: She has not had any issues with this in some time.  She used to breakout on her face though has not had issues with that recently.   ROS: See pertinent positives and negatives per HPI.  Past Medical History:  Diagnosis Date   Arthritis    Diverticulosis    Headache    occasionally   Hypertension    takes Lisinopril daily   Joint pain    Osteopenia    Rosacea      Past Surgical History:  Procedure Laterality Date   CHOLECYSTECTOMY     CHOLECYSTECTOMY     COLONOSCOPY     EYE SURGERY  2013   LASIK   REFRACTIVE SURGERY     SHOULDER ARTHROSCOPY WITH OPEN ROTATOR CUFF REPAIR AND DISTAL CLAVICLE ACROMINECTOMY Right 09/19/2014   Procedure: SHOULDER ARTHROSCOPY WITH MINI-OPEN ROTATOR CUFF REPAIR AND DISTAL CLAVICLE RESECTION, SUBACROMIAL DECOMPRESSION;  Surgeon: Garald Balding, MD;  Location: [place];  Service: Orthopedics;  Laterality: Right;   TONSILLECTOMY  at age 4   TOTAL KNEE ARTHROPLASTY Right 03/18/2016   TOTAL KNEE ARTHROPLASTY Right 03/18/2016   Procedure: TOTAL KNEE ARTHROPLASTY;  Surgeon: Garald Balding, MD;  Location: [place];  Service: Orthopedics;  Laterality: Right;   VAGINAL HYSTERECTOMY  at age 52    Family History  Problem Relation Age of Onset   Hypertension Mother    Heart disease Mother    Kidney disease Mother    Diabetes Father    Hypertension Father    Heart disease Father    Heart disease Brother    Cancer Maternal Aunt    Ovarian cancer Maternal Aunt 85   Stroke Brother    Alzheimer's disease Brother    Diabetes Paternal Grandmother    Breast cancer Neg Hx     SOCIAL HX: Non-smoker.   Current Outpatient Medications:    acetaminophen (TYLENOL) 500 MG tablet, Take 1,000 mg by mouth every 6 (six) hours as needed (for pain.)., Disp: ,  Rfl:    cholecalciferol (VITAMIN D) 1000 units tablet, Take 1,000 Units by mouth daily., Disp: , Rfl:    lisinopril (PRINIVIL,ZESTRIL) 10 MG tablet, TAKE 1 TABLET BY MOUTH ONCE DAILY, Disp: 90 tablet, Rfl: 3   Lysine 1000 MG TABS, Take 1,000 mg by mouth daily as needed (for fever blisters)., Disp: , Rfl:    Multiple Vitamins-Minerals (EYE VITAMINS PO), Take by mouth., Disp: , Rfl:    Nutritional Supplements (NUTRITIONAL SUPPLEMENT PO), Take 1 tablet by mouth daily., Disp: , Rfl:    Probiotic Product (PROBIOTIC DAILY PO), Take 1 capsule by mouth daily.  , Disp: , Rfl:    Propylene Glycol-Glycerin (SOOTHE) 0.6-0.6 % SOLN, Place 1 drop into both eyes daily., Disp: , Rfl:    vitamin B-12 (CYANOCOBALAMIN) 1000 MCG tablet, Take 1,000 mcg by mouth daily., Disp: , Rfl:   EXAM:  VITALS per patient if applicable: None.  GENERAL: alert, oriented, appears well and in no acute distress  HEENT: atraumatic, conjunttiva clear, no obvious abnormalities on inspection of external nose and ears  NECK: normal movements of the head and neck  LUNGS: on inspection no signs of respiratory distress, breathing rate appears normal, no obvious gross SOB, gasping or wheezing  CV: no obvious cyanosis  MS: moves all visible extremities without noticeable abnormality  PSYCH/NEURO: pleasant and cooperative, no obvious depression or anxiety, speech and thought processing grossly intact  ASSESSMENT AND PLAN:  Discussed the following assessment and plan:  Essential hypertension  Rosacea  Prediabetes  Hypertension Adequately controlled.  Plan for follow-up in 6 months.  Continue lisinopril.  Rosacea Patient has not had issues with this in some time.  She will monitor for recurrence.  Prediabetes Encouraged continued exercise and monitoring her diet.  Discussed decreasing fatty foods and carbohydrate intake.  Noted that this would help with her minimally elevated triglycerides as well.  [place] office staff will contact the patient to get her scheduled for follow-up in 6 months.  Social distancing precautions and sick precautions given regarding COVID-19.   I discussed the assessment and treatment plan with the patient. The patient was provided an opportunity to ask questions and all were answered. The patient agreed with the plan and demonstrated an understanding of the instructions.   The patient was advised to call back or seek an in-person evaluation if the symptoms worsen or if the condition fails to improve as anticipated.   [name], MD

## 2018-09-10 NOTE — Assessment & Plan Note (Signed)
Adequately controlled.  Plan for follow-up in 6 months.  Continue lisinopril.

## 2018-09-10 NOTE — Assessment & Plan Note (Signed)
Encouraged continued exercise and monitoring her diet.  Discussed decreasing fatty foods and carbohydrate intake.  Noted that this would help with her minimally elevated triglycerides as well.

## 2018-09-10 NOTE — Assessment & Plan Note (Signed)
Patient has not had issues with this in some time.  She will monitor for recurrence.

## 2018-10-04 ENCOUNTER — Ambulatory Visit
Admission: RE | Admit: 2018-10-04 | Discharge: 2018-10-04 | Disposition: A | Discharge status: Home / Self Care | Payer: Medicare Other | Source: Ambulatory Visit | Attending: Family Medicine | Admitting: Family Medicine

## 2018-10-04 ENCOUNTER — Other Ambulatory Visit: Payer: Self-pay

## 2018-10-04 DIAGNOSIS — Z1231 Encounter for screening mammogram for malignant neoplasm of breast: Secondary | ICD-10-CM | POA: Diagnosis not present

## 2019-01-03 ENCOUNTER — Ambulatory Visit (INDEPENDENT_AMBULATORY_CARE_PROVIDER_SITE_OTHER): Payer: Medicare Other

## 2019-01-03 ENCOUNTER — Other Ambulatory Visit: Payer: Self-pay

## 2019-01-03 DIAGNOSIS — Z23 Encounter for immunization: Secondary | ICD-10-CM

## 2019-01-10 ENCOUNTER — Other Ambulatory Visit: Payer: Self-pay | Admitting: Family Medicine

## 2019-01-10 DIAGNOSIS — I10 Essential (primary) hypertension: Secondary | ICD-10-CM

## 2019-01-12 ENCOUNTER — Encounter: Payer: Self-pay | Admitting: Family Medicine

## 2019-01-12 DIAGNOSIS — Z780 Asymptomatic menopausal state: Secondary | ICD-10-CM

## 2019-01-13 ENCOUNTER — Other Ambulatory Visit: Payer: Self-pay | Admitting: General Surgery

## 2019-01-24 ENCOUNTER — Ambulatory Visit
Admission: RE | Admit: 2019-01-24 | Discharge: 2019-01-24 | Disposition: A | Discharge status: Home / Self Care | Payer: Medicare Other | Source: Ambulatory Visit | Attending: Family Medicine | Admitting: Family Medicine

## 2019-01-24 DIAGNOSIS — Z780 Asymptomatic menopausal state: Secondary | ICD-10-CM | POA: Diagnosis not present

## 2019-01-24 DIAGNOSIS — M85851 Other specified disorders of bone density and structure, right thigh: Secondary | ICD-10-CM | POA: Diagnosis not present

## 2019-02-07 ENCOUNTER — Encounter (INDEPENDENT_AMBULATORY_CARE_PROVIDER_SITE_OTHER): Payer: Self-pay | Admitting: Orthopaedic Surgery

## 2019-02-08 NOTE — Telephone Encounter (Signed)
Ok to schedule another hip injection

## 2019-02-09 ENCOUNTER — Other Ambulatory Visit: Payer: Self-pay | Admitting: Orthopaedic Surgery

## 2019-02-10 ENCOUNTER — Telehealth: Payer: Self-pay | Admitting: *Deleted

## 2019-02-10 ENCOUNTER — Other Ambulatory Visit: Payer: Self-pay | Admitting: Orthopaedic Surgery

## 2019-02-10 DIAGNOSIS — M25552 Pain in left hip: Secondary | ICD-10-CM

## 2019-02-10 NOTE — Telephone Encounter (Signed)
-----   Message from Lendon Collar, RT sent at 02/10/2019  8:49 AM EDT ----- Good morning! I entered an order for a hip injection to be done at [facility] for this patient. I wanted to make sure it gets done since it does not show up in the referrals. Do I just let you know? Thanks!

## 2019-02-10 NOTE — Telephone Encounter (Signed)
I faxed the order to [facility] maiin scheduling advising what is needed to be done, if it isn't correct they will let me know and I will fix it and then let you know as well. [facility] will contact pt to schedule appt.

## 2019-02-11 ENCOUNTER — Other Ambulatory Visit: Payer: Self-pay | Admitting: Orthopaedic Surgery

## 2019-02-11 ENCOUNTER — Encounter (INDEPENDENT_AMBULATORY_CARE_PROVIDER_SITE_OTHER): Payer: Self-pay | Admitting: Orthopaedic Surgery

## 2019-02-11 DIAGNOSIS — M25552 Pain in left hip: Secondary | ICD-10-CM

## 2019-02-24 ENCOUNTER — Ambulatory Visit
Admission: RE | Admit: 2019-02-24 | Discharge: 2019-02-24 | Disposition: A | Discharge status: Home / Self Care | Payer: Medicare Other | Source: Ambulatory Visit | Attending: Orthopaedic Surgery | Admitting: Orthopaedic Surgery

## 2019-02-24 ENCOUNTER — Other Ambulatory Visit: Payer: Self-pay

## 2019-02-24 DIAGNOSIS — M25552 Pain in left hip: Secondary | ICD-10-CM | POA: Diagnosis not present

## 2019-02-24 DIAGNOSIS — G8929 Other chronic pain: Secondary | ICD-10-CM | POA: Insufficient documentation

## 2019-02-24 MED ORDER — ROPIVACAINE HCL 5 MG/ML IJ SOLN
3.0000 mL | Freq: Once | INTRAMUSCULAR | Status: AC
  Administered 2019-02-24: 10:00:00 3 mL
  Filled 2019-02-24: qty 3

## 2019-02-24 MED ORDER — LIDOCAINE HCL (PF) 1 % IJ SOLN
5.0000 mL | Freq: Once | INTRAMUSCULAR | Status: AC
  Administered 2019-02-24: 5 mL
  Filled 2019-02-24: qty 5

## 2019-02-24 MED ORDER — IOHEXOL 180 MG/ML  SOLN
10.0000 mL | Freq: Once | INTRAMUSCULAR | Status: AC
  Administered 2019-02-24: 10:00:00 10 mL

## 2019-02-24 MED ORDER — TRIAMCINOLONE ACETONIDE 40 MG/ML IJ SUSP (RADIOLOGY)
40.0000 mg | Freq: Once | INTRAMUSCULAR | Status: AC
  Administered 2019-02-24: 40 mg

## 2019-04-11 ENCOUNTER — Other Ambulatory Visit: Payer: Self-pay | Admitting: Family Medicine

## 2019-04-11 DIAGNOSIS — I10 Essential (primary) hypertension: Secondary | ICD-10-CM

## 2019-04-14 ENCOUNTER — Telehealth: Payer: Self-pay | Admitting: Family Medicine

## 2019-04-14 ENCOUNTER — Other Ambulatory Visit: Payer: Self-pay | Admitting: Family Medicine

## 2019-04-14 DIAGNOSIS — I10 Essential (primary) hypertension: Secondary | ICD-10-CM

## 2019-04-14 NOTE — Telephone Encounter (Signed)
She should have an appointment now and also needs lab work.  Please get her scheduled for a follow-up visit.

## 2019-04-14 NOTE — Telephone Encounter (Signed)
Pt wants to know if she needs an appt now or an appt in 6 months for her refill on lisinopril (ZESTRIL) 10 MG tablet. Pt also wants clarification on whether she needs lab work done or not.

## 2019-04-18 ENCOUNTER — Encounter: Payer: Self-pay | Admitting: Family Medicine

## 2019-04-18 NOTE — Telephone Encounter (Signed)
Patient sent a mychart message about a possible UTI, I called and scheduled a virtual visit with you on Friday and I scheduled labs on weds.. to check for a UTI and labs, can you tell me what labs to order for her and I will order the urine.  Tavi Hoogendoorn,cma

## 2019-04-19 ENCOUNTER — Telehealth: Payer: Self-pay | Admitting: *Deleted

## 2019-04-19 DIAGNOSIS — N3000 Acute cystitis without hematuria: Secondary | ICD-10-CM

## 2019-04-19 NOTE — Telephone Encounter (Signed)
Ordered

## 2019-04-19 NOTE — Telephone Encounter (Signed)
Already ordered

## 2019-04-19 NOTE — Telephone Encounter (Signed)
Please place future orders for lab appt.  

## 2019-04-20 ENCOUNTER — Ambulatory Visit (INDEPENDENT_AMBULATORY_CARE_PROVIDER_SITE_OTHER): Payer: Medicare Other | Admitting: Family Medicine

## 2019-04-20 ENCOUNTER — Encounter: Payer: Self-pay | Admitting: Family Medicine

## 2019-04-20 ENCOUNTER — Other Ambulatory Visit: Payer: Self-pay

## 2019-04-20 ENCOUNTER — Other Ambulatory Visit (INDEPENDENT_AMBULATORY_CARE_PROVIDER_SITE_OTHER): Payer: Medicare Other

## 2019-04-20 DIAGNOSIS — M549 Dorsalgia, unspecified: Secondary | ICD-10-CM

## 2019-04-20 DIAGNOSIS — I10 Essential (primary) hypertension: Secondary | ICD-10-CM

## 2019-04-20 DIAGNOSIS — N3000 Acute cystitis without hematuria: Secondary | ICD-10-CM | POA: Diagnosis not present

## 2019-04-20 LAB — POCT URINALYSIS DIPSTICK
Bilirubin, UA: NEGATIVE
Blood, UA: NEGATIVE
Glucose, UA: NEGATIVE
Ketones, UA: NEGATIVE
Leukocytes, UA: NEGATIVE
Nitrite, UA: NEGATIVE
Protein, UA: NEGATIVE
Spec Grav, UA: <=1.005 — AB (ref 1.010–1.025)
Urobilinogen, UA: 0.2 E.U./dL
pH, UA: 5 (ref 5.0–8.0)

## 2019-04-20 LAB — URINALYSIS, MICROSCOPIC ONLY: RBC / HPF: NONE SEEN (ref 0–?)

## 2019-04-20 MED ORDER — LISINOPRIL 10 MG PO TABS: ORAL_TABLET | ORAL | 0 refills | Status: DC

## 2019-04-20 NOTE — Assessment & Plan Note (Signed)
Adequately controlled.  She will come in for lab work.  Continue lisinopril.  Follow-up in 6 months.

## 2019-04-20 NOTE — Assessment & Plan Note (Signed)
Burning discomfort in the skin.  This is mostly resolved.  No overlying rash to indicate shingles.  She did have some urinary symptoms though urinalysis was unremarkable.  We will await her culture.  She will monitor her symptoms and we will check in when her culture returns.

## 2019-04-20 NOTE — Progress Notes (Signed)
Virtual Visit via telephone Note  This visit type was conducted due to national recommendations for restrictions regarding the COVID-19 pandemic (e.g. social distancing).  This format is felt to be most appropriate for this patient at this time.  All issues noted in this document were discussed and addressed.  No physical exam was performed (except for noted visual exam findings with Video Visits).   I connected with Katherine Stanley today at  3:15 PM EST by telephone and verified that I am speaking with the correct person using two identifiers. Location patient: home Location provider: work  Persons participating in the virtual visit: patient, provider  I discussed the limitations, risks, security and privacy concerns of performing an evaluation and management service by telephone and the availability of in person appointments. I also discussed with the patient that there may be a patient responsible charge related to this service. The patient expressed understanding and agreed to proceed.  Interactive audio and video telecommunications were attempted between this provider and patient, however failed, due to patient having technical difficulties OR patient did not have access to video capability.  We continued and completed visit with audio only.   Reason for visit: same day visit   HPI: HYPERTENSION  Disease Monitoring  Home BP Monitoring 117/76 Chest pain- no    Dyspnea- no Medications  Compliance-  Taking lisinopril.  Back pain: Patient notes she had a burning sensation in her back and side and then developed some urinary frequency.  She laid down and it made it worse.  She had some discomfort with urination as well.  She notes her symptoms have improved at this point and she has just a minimal burning sensation in her side.  There is no urinary urgency.  No hematuria.  No vaginal discharge.  No injury.  No tenderness to palpation.  No bruises.  No rash.  She notes this mostly resolved.   Urine is unremarkable on testing today.  Culture was sent.     ROS: See pertinent positives and negatives per HPI.  Past Medical History:  Diagnosis Date  . Arthritis   . Diverticulosis   . Headache    occasionally  . Hypertension    takes Lisinopril daily  . Joint pain   . Osteopenia   . Rosacea     Past Surgical History:  Procedure Laterality Date  . CHOLECYSTECTOMY    . CHOLECYSTECTOMY    . COLONOSCOPY    . EYE SURGERY  2013   LASIK  . REFRACTIVE SURGERY    . SHOULDER ARTHROSCOPY WITH OPEN ROTATOR CUFF REPAIR AND DISTAL CLAVICLE ACROMINECTOMY Right 09/19/2014   Procedure: SHOULDER ARTHROSCOPY WITH MINI-OPEN ROTATOR CUFF REPAIR AND DISTAL CLAVICLE RESECTION, SUBACROMIAL DECOMPRESSION;  Surgeon: Garald Balding, MD;  Location: [place];  Service: Orthopedics;  Laterality: Right;  . TONSILLECTOMY  at age 52  . 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 TOTAL KNEE ARTHROPLASTY Right 03/18/2016   Procedure: TOTAL KNEE ARTHROPLASTY;  Surgeon: Garald Balding, MD;  Location: [place];  Service: Orthopedics;  Laterality: Right;  Marland Kitchen VAGINAL HYSTERECTOMY  at age 82    Family History  Problem Relation Age of Onset  . Hypertension Mother   . Heart disease Mother   . Kidney disease Mother   . Diabetes Father   . Hypertension Father   . Heart disease Father   . Heart disease Brother   . Cancer Maternal Aunt   . Ovarian cancer Maternal Aunt 85  . Stroke Brother   .  Alzheimer's disease Brother   . Diabetes Paternal Grandmother   . Breast cancer Neg Hx     SOCIAL HX: Non-smoker   Current Outpatient Medications:  .  acetaminophen (TYLENOL) 500 MG tablet, Take 1,000 mg by mouth every 6 (six) hours as needed (for pain.)., Disp: , Rfl:  .  cholecalciferol (VITAMIN D) 1000 units tablet, Take 1,000 Units by mouth daily., Disp: , Rfl:  .  lisinopril (ZESTRIL) 10 MG tablet, TAKE 1 TABLET BY MOUTH ONCE DAILY. PLEASE CALL OFFICE FOR 6 MONTH FOLLOW-UP, Disp: 90 tablet, Rfl: 0 .  Lysine  1000 MG TABS, Take 1,000 mg by mouth daily as needed (for fever blisters)., Disp: , Rfl:  .  Multiple Vitamins-Minerals (EYE VITAMINS PO), Take by mouth., Disp: , Rfl:  .  Nutritional Supplements (NUTRITIONAL SUPPLEMENT PO), Take 1 tablet by mouth daily., Disp: , Rfl:  .  Probiotic Product (PROBIOTIC DAILY PO), Take 1 capsule by mouth daily. , Disp: , Rfl:  .  Propylene Glycol-Glycerin (SOOTHE) 0.6-0.6 % SOLN, Place 1 drop into both eyes daily., Disp: , Rfl:  .  vitamin B-12 (CYANOCOBALAMIN) 1000 MCG tablet, Take 1,000 mcg by mouth daily., Disp: , Rfl:   EXAM: This was a telehealth telephone visit and thus no physical exam was completed.  ASSESSMENT AND PLAN:  Discussed the following assessment and plan:  Hypertension Adequately controlled.  She will come in for lab work.  Continue lisinopril.  Follow-up in 6 months.  Discomfort of back Burning discomfort in the skin.  This is mostly resolved.  No overlying rash to indicate shingles.  She did have some urinary symptoms though urinalysis was unremarkable.  We will await her culture.  She will monitor her symptoms and we will check in when her culture returns.   Orders Placed This Encounter  Procedures  . Basic Metabolic Panel (BMET)    Standing Status:   Future    Standing Expiration Date:   04/19/2020    Meds ordered this encounter  Medications  . lisinopril (ZESTRIL) 10 MG tablet    Sig: TAKE 1 TABLET BY MOUTH ONCE DAILY. PLEASE CALL OFFICE FOR 6 MONTH FOLLOW-UP    Dispense:  90 tablet    Refill:  0     I discussed the assessment and treatment plan with the patient. The patient was provided an opportunity to ask questions and all were answered. The patient agreed with the plan and demonstrated an understanding of the instructions.   The patient was advised to call back or seek an in-person evaluation if the symptoms worsen or if the condition fails to improve as anticipated.  I provided 14 minutes of non-face-to-face time  during this encounter.   [name], MD

## 2019-04-22 ENCOUNTER — Ambulatory Visit: Payer: Medicare Other | Admitting: Family Medicine

## 2019-04-22 LAB — URINE CULTURE
MICRO NUMBER:: 10013061
SPECIMEN QUALITY:: ADEQUATE

## 2019-04-23 ENCOUNTER — Encounter: Payer: Self-pay | Admitting: Family Medicine

## 2019-04-23 ENCOUNTER — Other Ambulatory Visit: Payer: Self-pay | Admitting: Family Medicine

## 2019-04-23 MED ORDER — CEPHALEXIN 500 MG PO CAPS: 500.0000 mg | ORAL_CAPSULE | Freq: Two times a day (BID) | ORAL | 0 refills | Status: DC

## 2019-04-28 ENCOUNTER — Other Ambulatory Visit: Payer: Self-pay

## 2019-04-28 ENCOUNTER — Other Ambulatory Visit (INDEPENDENT_AMBULATORY_CARE_PROVIDER_SITE_OTHER): Payer: Medicare Other

## 2019-04-28 DIAGNOSIS — I10 Essential (primary) hypertension: Secondary | ICD-10-CM | POA: Diagnosis not present

## 2019-04-28 LAB — BASIC METABOLIC PANEL
BUN: 22 mg/dL (ref 6–23)
CO2: 21 mEq/L (ref 19–32)
Calcium: 9.7 mg/dL (ref 8.4–10.5)
Chloride: 108 mEq/L (ref 96–112)
Creatinine, Ser: 0.63 mg/dL (ref 0.40–1.20)
GFR: 92.83 mL/min (ref >60.00)
Glucose, Bld: 118 mg/dL — ABNORMAL HIGH (ref 70–99)
Potassium: 4.2 mEq/L (ref 3.5–5.1)
Sodium: 138 mEq/L (ref 135–145)

## 2019-06-14 DIAGNOSIS — L578 Other skin changes due to chronic exposure to nonionizing radiation: Secondary | ICD-10-CM | POA: Diagnosis not present

## 2019-06-14 DIAGNOSIS — D239 Other benign neoplasm of skin, unspecified: Secondary | ICD-10-CM

## 2019-06-14 DIAGNOSIS — Z1283 Encounter for screening for malignant neoplasm of skin: Secondary | ICD-10-CM | POA: Diagnosis not present

## 2019-06-14 DIAGNOSIS — L814 Other melanin hyperpigmentation: Secondary | ICD-10-CM | POA: Diagnosis not present

## 2019-06-14 DIAGNOSIS — L821 Other seborrheic keratosis: Secondary | ICD-10-CM | POA: Diagnosis not present

## 2019-06-14 DIAGNOSIS — D234 Other benign neoplasm of skin of scalp and neck: Secondary | ICD-10-CM | POA: Diagnosis not present

## 2019-06-14 DIAGNOSIS — L853 Xerosis cutis: Secondary | ICD-10-CM | POA: Diagnosis not present

## 2019-06-14 DIAGNOSIS — D1801 Hemangioma of skin and subcutaneous tissue: Secondary | ICD-10-CM | POA: Diagnosis not present

## 2019-06-14 DIAGNOSIS — L918 Other hypertrophic disorders of the skin: Secondary | ICD-10-CM | POA: Diagnosis not present

## 2019-06-14 DIAGNOSIS — L820 Inflamed seborrheic keratosis: Secondary | ICD-10-CM | POA: Diagnosis not present

## 2019-06-14 HISTORY — DX: Other benign neoplasm of skin, unspecified: D23.9

## 2019-08-10 ENCOUNTER — Telehealth: Payer: Self-pay | Admitting: Orthopaedic Surgery

## 2019-08-10 ENCOUNTER — Other Ambulatory Visit: Payer: Self-pay

## 2019-08-10 DIAGNOSIS — M25552 Pain in left hip: Secondary | ICD-10-CM

## 2019-08-10 NOTE — Telephone Encounter (Signed)
Spoke with patient and placed order for Left hip injection to be done at [facility].

## 2019-08-10 NOTE — Telephone Encounter (Signed)
Please advise 

## 2019-08-10 NOTE — Telephone Encounter (Signed)
[facility] for repeat injection left hip

## 2019-08-10 NOTE — Telephone Encounter (Signed)
Patient called.   She wants to know if she needs to be seen by [place] before getting sent for another injection.   Call back: [phone]

## 2019-08-15 DIAGNOSIS — H2513 Age-related nuclear cataract, bilateral: Secondary | ICD-10-CM | POA: Diagnosis not present

## 2019-08-15 DIAGNOSIS — D3131 Benign neoplasm of right choroid: Secondary | ICD-10-CM | POA: Diagnosis not present

## 2019-08-15 DIAGNOSIS — H04123 Dry eye syndrome of bilateral lacrimal glands: Secondary | ICD-10-CM | POA: Diagnosis not present

## 2019-08-15 DIAGNOSIS — H524 Presbyopia: Secondary | ICD-10-CM | POA: Diagnosis not present

## 2019-08-15 DIAGNOSIS — H43393 Other vitreous opacities, bilateral: Secondary | ICD-10-CM | POA: Diagnosis not present

## 2019-08-19 ENCOUNTER — Telehealth: Payer: Self-pay | Admitting: Orthopaedic Surgery

## 2019-08-19 NOTE — Telephone Encounter (Signed)
Patient called.   Wanted to inform us that she still has not heard from [facility] regarding the injection   Call back: [phone]

## 2019-08-22 ENCOUNTER — Other Ambulatory Visit: Payer: Self-pay | Admitting: Family Medicine

## 2019-08-22 DIAGNOSIS — Z1231 Encounter for screening mammogram for malignant neoplasm of breast: Secondary | ICD-10-CM

## 2019-08-22 NOTE — Telephone Encounter (Signed)
Is there a number that she can call to schedule since [facility] has not contacted her?

## 2019-08-22 NOTE — Telephone Encounter (Signed)
noted 

## 2019-08-22 NOTE — Telephone Encounter (Signed)
Called and spoke with patient. Gave her the number to central scheduling and told her to ask to be scheduled for [facility] [phone].

## 2019-08-29 ENCOUNTER — Other Ambulatory Visit: Payer: Self-pay

## 2019-08-29 ENCOUNTER — Ambulatory Visit
Admission: RE | Admit: 2019-08-29 | Discharge: 2019-08-29 | Disposition: A | Discharge status: Home / Self Care | Payer: Medicare Other | Source: Ambulatory Visit | Attending: Orthopaedic Surgery | Admitting: Orthopaedic Surgery

## 2019-08-29 DIAGNOSIS — M25552 Pain in left hip: Secondary | ICD-10-CM | POA: Insufficient documentation

## 2019-08-29 MED ORDER — TRIAMCINOLONE ACETONIDE 40 MG/ML IJ SUSP
INTRAMUSCULAR | Status: AC
  Administered 2019-08-29: 40 mg
  Filled 2019-08-29: qty 1

## 2019-08-29 MED ORDER — ROPIVACAINE HCL 5 MG/ML IJ SOLN
7.0000 mL | Freq: Once | INTRAMUSCULAR | Status: DC
  Filled 2019-08-29: qty 7

## 2019-08-29 MED ORDER — METHYLPREDNISOLONE ACETATE 80 MG/ML IJ SUSP
INTRAMUSCULAR | Status: AC
  Filled 2019-08-29: qty 1

## 2019-08-29 MED ORDER — IOHEXOL 180 MG/ML  SOLN
15.0000 mL | Freq: Once | INTRAMUSCULAR | Status: AC
  Administered 2019-08-29: 15 mL

## 2019-08-29 MED ORDER — BUPIVACAINE HCL (PF) 0.25 % IJ SOLN
INTRAMUSCULAR | Status: AC
  Filled 2019-08-29: qty 30

## 2019-08-29 MED ORDER — ROPIVACAINE HCL 5 MG/ML IJ SOLN
INTRAMUSCULAR | Status: AC
  Administered 2019-08-29: 7 mL
  Filled 2019-08-29: qty 30

## 2019-08-29 MED ORDER — TRIAMCINOLONE ACETONIDE 40 MG/ML IJ SUSP (RADIOLOGY): 40.0000 mg | Freq: Once | INTRAMUSCULAR | Status: DC

## 2019-08-29 MED ORDER — LIDOCAINE HCL (PF) 1 % IJ SOLN
5.0000 mL | Freq: Once | INTRAMUSCULAR | Status: AC
  Administered 2019-08-29: 5 mL
  Filled 2019-08-29: qty 5

## 2019-09-06 ENCOUNTER — Ambulatory Visit: Payer: Medicare Other

## 2019-09-09 ENCOUNTER — Ambulatory Visit (INDEPENDENT_AMBULATORY_CARE_PROVIDER_SITE_OTHER): Payer: Medicare Other

## 2019-09-09 VITALS — BP 121/74 | Ht 60.0 in | Wt 166.0 lb

## 2019-09-09 DIAGNOSIS — Z1211 Encounter for screening for malignant neoplasm of colon: Secondary | ICD-10-CM

## 2019-09-09 DIAGNOSIS — Z0000 Encounter for general adult medical examination without abnormal findings: Secondary | ICD-10-CM | POA: Diagnosis not present

## 2019-09-09 NOTE — Progress Notes (Signed)
Subjective:   Katherine Stanley is a 74 y.o. female who presents for a subsequent  Medicare Annual Wellness Visit.  Review of Systems    No ROS. Virtual visit.  Cardiac Risk Factors include: obesity (BMI >30kg/m2);hypertension     Objective:    Today's Vitals   09/09/19 0839  BP: 121/74  Weight: 166 lb (75.3 kg)  Height: 5' (1.524 m)   Body mass index is 32.42 kg/m.  Advanced Directives 09/09/2019 09/03/2018 08/31/2017 08/28/2016 04/02/2016 03/18/2016 03/18/2016  Does Patient Have a Medical Advance Directive? Yes Yes Yes Yes Yes Yes Yes  Type of Paramedic of [place];Living will [place];Living will [place];Living will [place];Living will - Healthcare Power of [place]  Does patient want to make changes to medical advance directive? - No - Patient declined No - Patient declined No - Patient declined - No - Patient declined -  Copy of [place] in Chart? No - copy requested No - copy requested No - copy requested No - copy requested - No - copy requested No - copy requested    Current Medications (verified) Outpatient Encounter Medications as of 09/09/2019  Medication Sig  . acetaminophen (TYLENOL) 500 MG tablet Take 1,000 mg by mouth every 6 (six) hours as needed (for pain.).  Marland Kitchen cholecalciferol (VITAMIN D) 1000 units tablet Take 1,000 Units by mouth daily.  Marland Kitchen lisinopril (ZESTRIL) 10 MG tablet TAKE 1 TABLET BY MOUTH ONCE DAILY. PLEASE CALL OFFICE FOR 6 MONTH FOLLOW-UP  . Lysine 1000 MG TABS Take 1,000 mg by mouth daily as needed (for fever blisters).  . Multiple Vitamins-Minerals (EYE VITAMINS PO) Take by mouth.  . Nutritional Supplements (NUTRITIONAL SUPPLEMENT PO) Take 1 tablet by mouth daily.  . Probiotic Product (PROBIOTIC DAILY PO) Take 1 capsule by mouth daily.   Marland Kitchen Propylene Glycol-Glycerin (SOOTHE) 0.6-0.6 % SOLN Place 1 drop into both eyes  daily.  . vitamin B-12 (CYANOCOBALAMIN) 1000 MCG tablet Take 1,000 mcg by mouth daily.  . cephALEXin (KEFLEX) 500 MG capsule Take 1 capsule (500 mg total) by mouth 2 (two) times daily.   No facility-administered encounter medications on file as of 09/09/2019.    Allergies (verified) Alendronate and Aleve [naproxen sodium]   History: Past Medical History:  Diagnosis Date  . Arthritis   . Diverticulosis   . Headache    occasionally  . Hypertension    takes Lisinopril daily  . Joint pain   . Osteopenia   . Rosacea    Past Surgical History:  Procedure Laterality Date  . CHOLECYSTECTOMY    . CHOLECYSTECTOMY    . COLONOSCOPY    . EYE SURGERY  2013   LASIK  . REFRACTIVE SURGERY    . SHOULDER ARTHROSCOPY WITH OPEN ROTATOR CUFF REPAIR AND DISTAL CLAVICLE ACROMINECTOMY Right 09/19/2014   Procedure: SHOULDER ARTHROSCOPY WITH MINI-OPEN ROTATOR CUFF REPAIR AND DISTAL CLAVICLE RESECTION, SUBACROMIAL DECOMPRESSION;  Surgeon: Garald Balding, MD;  Location: [place];  Service: Orthopedics;  Laterality: Right;  . TONSILLECTOMY  at age 75  . 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 TOTAL KNEE ARTHROPLASTY Right 03/18/2016   Procedure: TOTAL KNEE ARTHROPLASTY;  Surgeon: Garald Balding, MD;  Location: [place];  Service: Orthopedics;  Laterality: Right;  Marland Kitchen VAGINAL HYSTERECTOMY  at age 27   Family History  Problem Relation Age of Onset  . Hypertension Mother   . Heart disease Mother   .  Kidney disease Mother   . Diabetes Father   . Hypertension Father   . Heart disease Father   . Heart disease Brother   . Cancer Maternal Aunt   . Ovarian cancer Maternal Aunt 85  . Stroke Brother   . Alzheimer's disease Brother   . Diabetes Paternal Grandmother   . Breast cancer Neg Hx    Social History   Socioeconomic History  . Marital status: Married    Spouse name: Not on file  . Number of children: Not on file  . Years of education: Not on file  . Highest education level: Not on file    Occupational History  . Not on file  Tobacco Use  . Smoking status: Never Smoker  . Smokeless tobacco: Never Used  Substance and Sexual Activity  . Alcohol use: No    Alcohol/week: 0.0 standard drinks  . Drug use: No  . Sexual activity: Yes    Birth control/protection: Post-menopausal, Surgical  Other Topics Concern  . Not on file  Social History Narrative   Lives in [place] with husband. No children. No pets.      Work - retired Sales promotion account executive   Diet - regular   Exercise - golf, gardening   Social Determinants of Radio broadcast assistant Strain:   . Difficulty of Paying Living Expenses:   Food Insecurity:   . Worried About Charity fundraiser in the Last Year:   . Arboriculturist in the Last Year:   Transportation Needs:   . Film/video editor (Medical):   Marland Kitchen Lack of Transportation (Non-Medical):   Physical Activity:   . Days of Exercise per Week:   . Minutes of Exercise per Session:   Stress:   . Feeling of Stress :   Social Connections:   . Frequency of Communication with Friends and Family:   . Frequency of Social Gatherings with Friends and Family:   . Attends Religious Services:   . Active Member of Clubs or Organizations:   . Attends Archivist Meetings:   Marland Kitchen Marital Status:     Tobacco Counseling Non-smoker  Clinical Intake:  Pre-visit preparation completed: Yes  Pain : No/denies pain     Nutritional Status: BMI > 30  Obese Diabetes: No  How often do you need to have someone help you when you read instructions, pamphlets, or other written materials from your doctor or pharmacy?: 1 - Never  Nutrition Risk Assessment:  Has the patient had any N/V/D within the last 2 months?  No Has the patient had any unintentional weight loss or weight gain?  No  Patient says she has a healthy diet with adequate water intake.  Diabetes: Is the patient diabetic? No   Interpreter Needed?: No      Activities of Daily Living In  your present state of health, do you have any difficulty performing the following activities: 09/09/2019  Hearing? N  Vision? N  Difficulty concentrating or making decisions? N  Walking or climbing stairs? N  Dressing or bathing? N  Doing errands, shopping? N  Preparing Food and eating ? N  Using the Toilet? N  In the past six months, have you accidently leaked urine? N  Do you have problems with loss of bowel control? N  Managing your Medications? N  Managing your Finances? N  Housekeeping or managing your Housekeeping? N  Some recent data might be hidden     Immunizations and Health Maintenance Immunization History  Administered Date(s) Administered  . Fluad Quad(high Dose 65+) 01/03/2019  . Influenza, High Dose Seasonal PF 01/16/2017, 02/19/2018  . Influenza,inj,Quad PF,6+ Mos 02/24/2013, 01/23/2014, 02/28/2015, 01/28/2016  . PFIZER SARS-COV-2 Vaccination 05/24/2019, 06/14/2019  . Pneumococcal Conjugate-13 07/20/2013  . Pneumococcal Polysaccharide-23 07/24/2014  . Td 07/16/2010   There are no preventive care reminders to display for this patient.  Patient Care Team: [name], MD as PCP - General (Family Medicine)  Indicate any recent Medical Services you may have received from other than [facility] providers in the past year (date may be approximate). None    Assessment:   This is a routine wellness examination for [place].  I connected with Alythia today by telephone and verified that I am speaking with the correct person using two identifiers. Location patient: home Location provider: work Persons participating in the virtual visit: patient, LPN.   I discussed the limitations, risks, security and privacy concerns of performing an evaluation and management service by telephone and the availability of in person appointments. I also discussed with the patient that there may be a patient responsible charge related to this service. The patient expressed understanding and  verbally consented to this telephonic visit.    Interactive audio and video telecommunications were attempted between this provider and patient, however failed, due to patient having technical difficulties OR patient did not have access to video capability.  We continued and completed visit with audio only.  Some vital signs may be absent or patient reported.    Hearing/Vision screen  Hearing Screening   125Hz  250Hz  500Hz  1000Hz  2000Hz  3000Hz  4000Hz  6000Hz  8000Hz   Right ear:           Left ear:           Comments: No problems hearing  Vision Screening Comments: Wears glasses Last eye visit 07/2019  Dietary issues and exercise activities discussed: Current Exercise Habits: Home exercise routine(Golfing, eliptical, walking), Type of exercise: walking, Time (Minutes): 30, Frequency (Times/Week): 3, Weekly Exercise (Minutes/Week): 90, Intensity: Mild, Exercise limited by: None identified  Goals Addressed            This Visit's Progress   . Healthy Lifestyle       Stay hydrated and drink plenty of fluids. Low carb foods. Lean meats and vegetables. Stay active and continue exercising as tolerated.      Depression Screen PHQ 2/9 Scores 09/09/2019 04/20/2019 09/03/2018 08/31/2017 08/28/2016 10/24/2015 08/29/2015  PHQ - 2 Score 0 0 0 0 0 0 0  PHQ- 9 Score - - - - 0 - -    Fall Risk Fall Risk  09/09/2019 04/20/2019 09/03/2018 08/31/2017 08/28/2016  Falls in the past year? 0 0 0 Yes No  Number falls in past yr: 0 0 - 1 -  Injury with Fall? - - - No -  Comment - - - Steppped on a rock and rolled her ankle over. She did not seek medical attention. Supported with sports tape.   -  Follow up - Falls evaluation completed - Education provided;Falls prevention discussed -     TIMED UP AND GO:  Was the test performed? No. Virtual Visit     Cognitive Function: MMSE - Mini Mental State Exam 08/31/2017 08/28/2016 08/29/2015  Orientation to time 5 5 5   Orientation to Place 5 5 5   Registration 3 3 3    Attention/ Calculation 5 5 5   Recall 3 2 3   Language- name 2 objects 2 2 2   Language- repeat 1 1 1  Language- follow 3 step command 3 3 3   Language- read & follow direction 1 1 1   Write a sentence 1 1 1   Copy design 1 1 1   Total score 30 29 30      6CIT Screen 09/03/2018  What Year? 0 points  What month? 0 points  What time? 0 points  Count back from 20 0 points  Months in reverse 0 points  Repeat phrase 0 points  Total Score 0  MMSE/6CIT not performed. No cognitive impairments noted. Patient likes to read & plays word games daily on her phone. Manages her own finances & medications without assistance.  Screening Tests Health Maintenance  Topic Date Due  . MAMMOGRAM  10/04/2019  . COLONOSCOPY  10/27/2019  . INFLUENZA VACCINE  11/13/2019  . TETANUS/TDAP  07/15/2020  . DEXA SCAN  01/23/2021  . COVID-19 Vaccine  Completed  . Hepatitis C Screening  Completed  . PNA vac Low Risk Adult  Completed     Cancer Screenings:  Colorectal Screening: Completed 10/26/2009. Referral to GI placed today. Pt aware the office will call re: appt.  Mammogram: Scheduled for 10/10/2019. Patient aware.  Bone Density: Completed 01/24/2019. Results reflect OSTEOPENIA. Repeat every 2 years.    Additional Screening:  Dental Screening: Recommended annual dental exams for proper oral hygiene. Patient states she sees her dentist every 6 months.  Community Resource Referral:  CRR required this visit?  No     Plan:  I have personally reviewed and addressed the Medicare Annual Wellness questionnaire and have noted the following in the patient's chart:  A. Medical and social history B. Use of alcohol, tobacco or illicit drugs  C. Current medications and supplements D. Functional ability and status E.  Nutritional status F.  Physical activity G. Advance directives H. List of other physicians I.  Hospitalizations, surgeries, and ER visits in previous 12 months J.  [place] such  as hearing and vision if needed, cognitive and depression L. Referrals and appointment   I have reviewed and discussed with patient certain preventive protocols, quality metrics, and best practice recommendations.    Signed,    Marta Antu, LPN   [id number]  Nurse Health Advisor    Nurse Notes:  Follow up appt with Dr Caryl Bis 10/19/19 @ 8:30. Patient aware.

## 2019-09-09 NOTE — Progress Notes (Signed)
I have reviewed the above note and agree.  Cherly Erno, M.D.  

## 2019-09-09 NOTE — Patient Instructions (Addendum)
Katherine Stanley , Thank you for taking time to come for your Medicare Wellness Visit. I appreciate your ongoing commitment to your health goals. Please review the following plan we discussed and let me know if I can assist you in the future.   These are the goals we discussed: Goals      Patient Stated   . DIET - INCREASE LEAN PROTEINS (pt-stated)     Low carb diet      Other   . Healthy Lifestyle     Stay hydrated and drink plenty of fluids. Low carb foods. Lean meats and vegetables. Stay active and continue exercising as tolerated.       This is a list of the screening recommended for you and due dates:  Health Maintenance  Topic Date Due  . Mammogram  10/04/2019  . Colon Cancer Screening  10/27/2019  . Flu Shot  11/13/2019  . Tetanus Vaccine  07/15/2020  . DEXA scan (bone density measurement)  01/23/2021  . COVID-19 Vaccine  Completed  .  Hepatitis C: One time screening is recommended by Center for Disease Control  (CDC) for  adults born from [birth year] through 1964.   Completed  . Pneumonia vaccines  Completed     Colonoscopy, Adult A colonoscopy is a procedure to look at the entire large intestine. This procedure is done using a long, thin, flexible tube that has a camera on the end. You may have a colonoscopy:  As a part of normal colorectal screening.  If you have certain symptoms, such as: ? A low number of red blood cells in your blood (anemia). ? Diarrhea that does not go away. ? Pain in your abdomen. ? Blood in your stool. A colonoscopy can help screen for and diagnose medical problems, including:  Tumors.  Extra tissue that grows where mucus forms (polyps).  Inflammation.  Areas of bleeding. Tell your health care provider about:  Any allergies you have.  All medicines you are taking, including vitamins, herbs, eye drops, creams, and over-the-counter medicines.  Any problems you or family members have had with anesthetic medicines.  Any blood disorders  you have.  Any surgeries you have had.  Any medical conditions you have.  Any problems you have had with having bowel movements.  Whether you are pregnant or may be pregnant. What are the risks? Generally, this is a safe procedure. However, problems may occur, including:  Bleeding.  Damage to your intestine.  Allergic reactions to medicines given during the procedure.  Infection. This is rare. What happens before the procedure? Eating and drinking restrictions Follow instructions from your health care provider about eating or drinking restrictions, which may include:  A few days before the procedure: ? Follow a low-fiber diet. ? Avoid nuts, seeds, dried fruit, raw fruits, and vegetables.  1-3 days before the procedure: ? Eat only gelatin dessert or ice pops. ? Drink only clear liquids, such as water, clear juice, clear broth or bouillon, black coffee or tea, or clear soft drinks or sports drinks. ? Avoid liquids that contain red or purple dye.  The day of the procedure: ? Do not eat solid foods. You may continue to drink clear liquids until up to 2 hours before the procedure. ? Do not eat or drink anything starting 2 hours before the procedure, or within the time period that your health care provider recommends. Bowel prep If you were prescribed a bowel prep to take by mouth (orally) to clean out your colon:  Take it as told by your health care provider. Starting the day before your procedure, you will need to drink a large amount of liquid medicine. The liquid will cause you to have many bowel movements of loose stool until your stool becomes almost clear or light green.  If your skin or the opening between the buttocks (anus) gets irritated from diarrhea, you may relieve the irritation using: ? Wipes with medicine in them, such as adult wet wipes with aloe and vitamin E. ? A product to soothe skin, such as petroleum jelly.  If you vomit while drinking the bowel  prep: ? Take a break for up to 60 minutes. ? Begin the bowel prep again. ? Call your health care provider if you keep vomiting or you cannot take the bowel prep without vomiting.  To clean out your colon, you may also be given: ? Laxative medicines. These help you have a bowel movement. ? Instructions for enema use. An enema is liquid medicine injected into your rectum. Medicines Ask your health care provider about:  Changing or stopping your regular medicines or supplements. This is especially important if you are taking iron supplements, diabetes medicines, or blood thinners.  Taking medicines such as aspirin and ibuprofen. These medicines can thin your blood. Do not take these medicines unless your health care provider tells you to take them.  Taking over-the-counter medicines, vitamins, herbs, and supplements. General instructions  Ask your health care provider what steps will be taken to help prevent infection. These may include washing skin with a germ-killing soap.  Plan to have someone take you home from the hospital or clinic. What happens during the procedure?   An IV will be inserted into one of your veins.  You may be given one or more of the following: ? A medicine to help you relax (sedative). ? A medicine to numb the area (local anesthetic). ? A medicine to make you fall asleep (general anesthetic). This is rarely needed.  You will lie on your side with your knees bent.  The tube will: ? Have oil or gel put on it (be lubricated). ? Be inserted into your anus. ? Be gently eased through all parts of your large intestine.  Air will be sent into your colon to keep it open. This may cause some pressure or cramping.  Images will be taken with the camera and will appear on a screen.  A small tissue sample may be removed to be looked at under a microscope (biopsy). The tissue may be sent to a lab for testing if any signs of problems are found.  If small polyps are  found, they may be removed and checked for cancer cells.  When the procedure is finished, the tube will be removed. The procedure may vary among health care providers and hospitals. What happens after the procedure?  Your blood pressure, heart rate, breathing rate, and blood oxygen level will be monitored until you leave the hospital or clinic.  You may have a small amount of blood in your stool.  You may pass gas and have mild cramping or bloating in your abdomen. This is caused by the air that was used to open your colon during the exam.  Do not drive for 24 hours after the procedure.  It is up to you to get the results of your procedure. Ask your health care provider, or the department that is doing the procedure, when your results will be ready. Summary  A colonoscopy  is a procedure to look at the entire large intestine.  Follow instructions from your health care provider about eating and drinking before the procedure.  If you were prescribed an oral bowel prep to clean out your colon, take it as told by your health care provider.  During the colonoscopy, a flexible tube with a camera on its end is inserted into the anus and then passed into the other parts of the large intestine. This information is not intended to replace advice given to you by your health care provider. Make sure you discuss any questions you have with your health care provider. Document Revised: 10/22/2018 Document Reviewed: 10/22/2018 Elsevier Patient Education  [place].

## 2019-09-13 ENCOUNTER — Ambulatory Visit (INDEPENDENT_AMBULATORY_CARE_PROVIDER_SITE_OTHER): Payer: Medicare Other | Admitting: Orthopaedic Surgery

## 2019-09-13 ENCOUNTER — Ambulatory Visit: Payer: Self-pay

## 2019-09-13 ENCOUNTER — Other Ambulatory Visit: Payer: Self-pay

## 2019-09-13 ENCOUNTER — Encounter: Payer: Self-pay | Admitting: Orthopaedic Surgery

## 2019-09-13 DIAGNOSIS — M1712 Unilateral primary osteoarthritis, left knee: Secondary | ICD-10-CM | POA: Diagnosis not present

## 2019-09-13 DIAGNOSIS — G8929 Other chronic pain: Secondary | ICD-10-CM | POA: Diagnosis not present

## 2019-09-13 DIAGNOSIS — M25562 Pain in left knee: Secondary | ICD-10-CM

## 2019-09-13 MED ORDER — METHYLPREDNISOLONE ACETATE 40 MG/ML IJ SUSP
80.0000 mg | INTRAMUSCULAR | Status: AC | PRN
  Administered 2019-09-13: 80 mg via INTRA_ARTICULAR

## 2019-09-13 MED ORDER — BUPIVACAINE HCL 0.25 % IJ SOLN
2.0000 mL | INTRAMUSCULAR | Status: AC | PRN
  Administered 2019-09-13: 2 mL via INTRA_ARTICULAR

## 2019-09-13 MED ORDER — LIDOCAINE HCL 1 % IJ SOLN
2.0000 mL | INTRAMUSCULAR | Status: AC | PRN
  Administered 2019-09-13: 2 mL

## 2019-09-13 NOTE — Progress Notes (Signed)
Office Visit Note   Patient: Katherine Stanley           Date of Birth: [birth year]           MRN: [id number] Visit Date: 09/13/2019              Requested by: [name], MD [address] [place],  [place] [address] PCP: [name], MD   Assessment & Plan: Visit Diagnoses:  1. Chronic pain of left knee   2. Unilateral primary osteoarthritis, left knee     Plan:  #1: Corticosteroid injection to the left knee #2: Follow back up as needed  Follow-Up Instructions: No follow-ups on file.   Orders:  Orders Placed This Encounter  Procedures  . XR KNEE 3 VIEW LEFT   No orders of the defined types were placed in this encounter.     Procedures: Large Joint Inj: L knee on 09/13/2019 5:48 PM Indications: pain and diagnostic evaluation Details: 25 G 1.5 in needle, anteromedial approach  Arthrogram: No  Medications: 2 mL lidocaine 1 %; 80 mg methylPREDNISolone acetate 40 MG/ML; 2 mL bupivacaine 0.25 % Procedure, treatment alternatives, risks and benefits explained, specific risks discussed. Consent was given by the patient. Patient was prepped and draped in the usual sterile fashion.       Clinical Data: No additional findings.   Subjective: Chief Complaint  Patient presents with  . Left Knee - Pain   HPI Patient presents today for left knee pain. She states that it has been bothering her worse since the beginning of the year, but minimally before this year. Her pain is located medially and inferior to her patella. No swelling, giving way, or grinding. She is taking Ibuprofen as needed and has been applying Voltaren gel.  This apparently is not working as well for her knee pain.  Denies any recent history of injury or trauma.   Review of Systems  Constitutional: Negative for fatigue.  HENT: Negative for ear pain.   Eyes: Negative for pain.  Respiratory: Negative for shortness of breath.   Cardiovascular: Negative for leg swelling.    Gastrointestinal: Positive for diarrhea. Negative for constipation.  Endocrine: Negative for cold intolerance and heat intolerance.  Genitourinary: Negative for difficulty urinating.  Musculoskeletal: Negative for joint swelling.  Skin: Negative for rash.  Allergic/Immunologic: Negative for food allergies.  Neurological: Negative for weakness.  Hematological: Does not bruise/bleed easily.  Psychiatric/Behavioral: Negative for sleep disturbance.     Objective: Vital Signs: There were no vitals taken for this visit.  Physical Exam Constitutional:      Appearance: She is well-developed.  Eyes:     Pupils: Pupils are equal, round, and reactive to light.  Pulmonary:     Effort: Pulmonary effort is normal.  Skin:    General: Skin is warm and dry.  Neurological:     Mental Status: She is alert and oriented to person, place, and time.  Psychiatric:        Behavior: Behavior normal.     Ortho Exam  Exam today reveals trace effusion of the left knee.  She does have patellofemoral crepitance with range of motion.  She does have some pseudolaxity   Specialty Comments:  No specialty comments available.  Imaging: No results found.   PMFS History: Patient Active Problem List   Diagnosis Date Noted  . Unilateral primary osteoarthritis, left knee 09/13/2019  . Discomfort of back 04/20/2019  . Prediabetes 03/19/2018  . Unilateral primary  osteoarthritis, right knee 03/18/2016  . S/P total knee replacement using cement, right 03/18/2016  . 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 Osteopenia 08/23/2012  . 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 Hypertension 10/27/2011  . Rosacea    Past Medical History:  Diagnosis Date  . Arthritis   . Diverticulosis   . Headache    occasionally  . Hypertension    takes Lisinopril daily  . Joint pain   . Osteopenia   . Rosacea     Family History  Problem Relation Age of Onset  . Hypertension Mother   . Heart disease Mother   .  Kidney disease Mother   . Diabetes Father   . Hypertension Father   . Heart disease Father   . Heart disease Brother   . Cancer Maternal Aunt   . Ovarian cancer Maternal Aunt 85  . Stroke Brother   . Alzheimer's disease Brother   . Diabetes Paternal Grandmother   . Breast cancer Neg Hx     Past Surgical History:  Procedure Laterality Date  . CHOLECYSTECTOMY    . CHOLECYSTECTOMY    . COLONOSCOPY    . EYE SURGERY  2013   LASIK  . REFRACTIVE SURGERY    . SHOULDER ARTHROSCOPY WITH OPEN ROTATOR CUFF REPAIR AND DISTAL CLAVICLE ACROMINECTOMY Right 09/19/2014   Procedure: SHOULDER ARTHROSCOPY WITH MINI-OPEN ROTATOR CUFF REPAIR AND DISTAL CLAVICLE RESECTION, SUBACROMIAL DECOMPRESSION;  Surgeon: Garald Balding, MD;  Location: [place];  Service: Orthopedics;  Laterality: Right;  . TONSILLECTOMY  at age 89  . 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 TOTAL KNEE ARTHROPLASTY Right 03/18/2016   Procedure: TOTAL KNEE ARTHROPLASTY;  Surgeon: Garald Balding, MD;  Location: [place];  Service: Orthopedics;  Laterality: Right;  Marland Kitchen VAGINAL HYSTERECTOMY  at age 80   Social History   Occupational History  . Not on file  Tobacco Use  . Smoking status: Never Smoker  . Smokeless tobacco: Never Used  Substance and Sexual Activity  . Alcohol use: No    Alcohol/week: 0.0 standard drinks  . Drug use: No  . Sexual activity: Yes    Birth control/protection: Post-menopausal, Surgical

## 2019-09-15 ENCOUNTER — Other Ambulatory Visit: Payer: Self-pay | Admitting: Family Medicine

## 2019-09-15 DIAGNOSIS — I10 Essential (primary) hypertension: Secondary | ICD-10-CM

## 2019-09-16 MED ORDER — LISINOPRIL 10 MG PO TABS: ORAL_TABLET | ORAL | 0 refills | Status: DC

## 2019-09-19 ENCOUNTER — Telehealth (INDEPENDENT_AMBULATORY_CARE_PROVIDER_SITE_OTHER): Payer: Self-pay | Admitting: Gastroenterology

## 2019-09-19 ENCOUNTER — Other Ambulatory Visit: Payer: Self-pay

## 2019-09-19 DIAGNOSIS — Z1211 Encounter for screening for malignant neoplasm of colon: Secondary | ICD-10-CM

## 2019-09-19 NOTE — Progress Notes (Signed)
Patient called back to re-schedule her screening colonoscopy from 08/06 to 08/13.  Dr. Vicente Males will not be available on this date, so she has been rescheduled to Dr. Michele Mcalpine schedule on 11/25/19. She requested either of the two physicians for her procedure.  Thanks,  [place], [place]

## 2019-09-19 NOTE — Progress Notes (Signed)
Gastroenterology Pre-Procedure Review  Request Date: Friday 11/18/19 Requesting Physician: Dr. Vicente Males  PATIENT REVIEW QUESTIONS: The patient responded to the following health history questions as indicated:    1. Are you having any GI issues? no 2. Do you have a personal history of Polyps? no 3. Do you have a family history of Colon Cancer or Polyps? no 4. Diabetes Mellitus? no 5. Joint replacements in the past 12 months?no 6. Major health problems in the past 3 months?no 7. Any artificial heart valves, MVP, or defibrillator?no    MEDICATIONS & ALLERGIES:    Patient reports the following regarding taking any anticoagulation/antiplatelet therapy:   Plavix, Coumadin, Eliquis, Xarelto, Lovenox, Pradaxa, Brilinta, or Effient? no Aspirin? no  Patient confirms/reports the following medications:  Current Outpatient Medications  Medication Sig Dispense Refill   acetaminophen (TYLENOL) 500 MG tablet Take 1,000 mg by mouth every 6 (six) hours as needed (for pain.).     cholecalciferol (VITAMIN D) 1000 units tablet Take 1,000 Units by mouth daily.     lisinopril (ZESTRIL) 10 MG tablet TAKE 1 TABLET BY MOUTH ONCE DAILY. PLEASE CALL OFFICE FOR 6 MONTH FOLLOW-UP 90 tablet 0   Lysine 1000 MG TABS Take 1,000 mg by mouth daily as needed (for fever blisters).     Multiple Vitamins-Minerals (EYE VITAMINS PO) Take by mouth.     Nutritional Supplements (NUTRITIONAL SUPPLEMENT PO) Take 1 tablet by mouth daily.     Probiotic Product (PROBIOTIC DAILY PO) Take 1 capsule by mouth daily.      Propylene Glycol-Glycerin (SOOTHE) 0.6-0.6 % SOLN Place 1 drop into both eyes daily.     vitamin B-12 (CYANOCOBALAMIN) 1000 MCG tablet Take 1,000 mcg by mouth daily.     prednisoLONE acetate (PRED FORTE) 1 % ophthalmic suspension      No current facility-administered medications for this visit.    Patient confirms/reports the following allergies:  Allergies  Allergen Reactions   Alendronate Other (See  Comments)    Diffuse myalgia and arthralgia   Aleve [Naproxen Sodium] Nausea And Vomiting    Sick to stomach    No orders of the defined types were placed in this encounter.   AUTHORIZATION INFORMATION Primary Insurance: 1D#: Group #:  Secondary Insurance: 1D#: Group #:  SCHEDULE INFORMATION: Date: Friday 11/18/19 Time: Location:[facility]

## 2019-10-04 ENCOUNTER — Encounter: Payer: Self-pay | Admitting: Family Medicine

## 2019-10-04 DIAGNOSIS — I10 Essential (primary) hypertension: Secondary | ICD-10-CM

## 2019-10-04 DIAGNOSIS — R7303 Prediabetes: Secondary | ICD-10-CM

## 2019-10-10 ENCOUNTER — Ambulatory Visit
Admission: RE | Admit: 2019-10-10 | Discharge: 2019-10-10 | Disposition: A | Discharge status: Home / Self Care | Payer: Medicare Other | Source: Ambulatory Visit | Attending: Family Medicine | Admitting: Family Medicine

## 2019-10-10 DIAGNOSIS — Z1231 Encounter for screening mammogram for malignant neoplasm of breast: Secondary | ICD-10-CM | POA: Diagnosis not present

## 2019-10-13 ENCOUNTER — Other Ambulatory Visit (INDEPENDENT_AMBULATORY_CARE_PROVIDER_SITE_OTHER): Payer: Medicare Other

## 2019-10-13 ENCOUNTER — Other Ambulatory Visit: Payer: Self-pay

## 2019-10-13 DIAGNOSIS — R7303 Prediabetes: Secondary | ICD-10-CM

## 2019-10-13 DIAGNOSIS — I10 Essential (primary) hypertension: Secondary | ICD-10-CM

## 2019-10-13 LAB — LIPID PANEL
Cholesterol: 142 mg/dL (ref 0–200)
HDL: 54.8 mg/dL (ref >39.00)
LDL Cholesterol: 60 mg/dL (ref 0–99)
NonHDL: 87.56
Total CHOL/HDL Ratio: 3
Triglycerides: 139 mg/dL (ref 0.0–149.0)
VLDL: 27.8 mg/dL (ref 0.0–40.0)

## 2019-10-13 LAB — COMPREHENSIVE METABOLIC PANEL
ALT: 17 U/L (ref 0–35)
AST: 14 U/L (ref 0–37)
Albumin: 4.3 g/dL (ref 3.5–5.2)
Alkaline Phosphatase: 66 U/L (ref 39–117)
BUN: 16 mg/dL (ref 6–23)
CO2: 23 mEq/L (ref 19–32)
Calcium: 9.5 mg/dL (ref 8.4–10.5)
Chloride: 102 mEq/L (ref 96–112)
Creatinine, Ser: 0.64 mg/dL (ref 0.40–1.20)
GFR: 91.04 mL/min (ref >60.00)
Glucose, Bld: 109 mg/dL — ABNORMAL HIGH (ref 70–99)
Potassium: 4.6 mEq/L (ref 3.5–5.1)
Sodium: 136 mEq/L (ref 135–145)
Total Bilirubin: 0.8 mg/dL (ref 0.2–1.2)
Total Protein: 6.7 g/dL (ref 6.0–8.3)

## 2019-10-13 LAB — HEMOGLOBIN A1C: Hgb A1c MFr Bld: 5.9 % (ref 4.6–6.5)

## 2019-10-19 ENCOUNTER — Ambulatory Visit: Payer: Medicare Other | Admitting: Family Medicine

## 2019-11-04 ENCOUNTER — Encounter: Payer: Self-pay | Admitting: Family Medicine

## 2019-11-04 ENCOUNTER — Other Ambulatory Visit: Payer: Self-pay

## 2019-11-04 ENCOUNTER — Ambulatory Visit (INDEPENDENT_AMBULATORY_CARE_PROVIDER_SITE_OTHER): Payer: Medicare Other | Admitting: Family Medicine

## 2019-11-04 DIAGNOSIS — I10 Essential (primary) hypertension: Secondary | ICD-10-CM | POA: Diagnosis not present

## 2019-11-04 DIAGNOSIS — R7303 Prediabetes: Secondary | ICD-10-CM | POA: Diagnosis not present

## 2019-11-04 MED ORDER — LISINOPRIL 10 MG PO TABS: 5.0000 mg | ORAL_TABLET | Freq: Every day | ORAL | 1 refills | Status: DC

## 2019-11-04 NOTE — Progress Notes (Signed)
  [name], MD Phone: [phone]  Katherine Stanley is a 74 y.o. female who presents today for f/u.  HYPERTENSION  Disease Monitoring  Home BP Monitoring 106-123/67-80 Chest pain- no    Dyspnea- no Medications  Compliance-  Taking lisinopril 10 mg, she wonders if we could decrease to 5 mg daily.   Edema- no  Prediabetes: No polyuria or polydipsia.  She is try to cut down on bacon and other fatty foods.  She plays golf and does the elliptical.  She is quite active.    Social History   Tobacco Use  Smoking Status Never Smoker  Smokeless Tobacco Never Used     ROS see history of present illness  Objective  Physical Exam Vitals:   11/04/19 1113  BP: 120/80  Pulse: 101  Temp: 98.3 F (36.8 C)  SpO2: 96%    BP Readings from Last 3 Encounters:  11/04/19 120/80  09/09/19 121/74  09/03/18 126/82   Wt Readings from Last 3 Encounters:  11/04/19 170 lb 6.4 oz (77.3 kg)  09/09/19 166 lb (75.3 kg)  04/20/19 166 lb (75.3 kg)    Physical Exam Constitutional:      General: She is not in acute distress.    Appearance: She is not diaphoretic.  Cardiovascular:     Rate and Rhythm: Normal rate and regular rhythm.     Heart sounds: Normal heart sounds.  Pulmonary:     Effort: Pulmonary effort is normal.     Breath sounds: Normal breath sounds.  Musculoskeletal:     Right lower leg: No edema.     Left lower leg: No edema.  Skin:    General: Skin is warm and dry.  Neurological:     Mental Status: She is alert.      Assessment/Plan: Please see individual problem list.  Hypertension Adequate control.  Discussed that we could trial decreasing lisinopril to 5 mg once daily and see how she does.  She will send me a list of her readings in 2 weeks.  Prediabetes A1c stable.  Continue diet and exercise.   Health Maintenance: Patient will keep her colonoscopy appointment.  No orders of the defined types were placed in this encounter.   Meds ordered this  encounter  Medications  . lisinopril (ZESTRIL) 10 MG tablet    Sig: Take 0.5 tablets (5 mg total) by mouth daily.    Dispense:  45 tablet    Refill:  1    This visit occurred during the SARS-CoV-2 public health emergency.  Safety protocols were in place, including screening questions prior to the visit, additional usage of staff PPE, and extensive cleaning of exam room while observing appropriate contact time as indicated for disinfecting solutions.    [name], MD [place]

## 2019-11-04 NOTE — Assessment & Plan Note (Signed)
Adequate control.  Discussed that we could trial decreasing lisinopril to 5 mg once daily and see how she does.  She will send me a list of her readings in 2 weeks.

## 2019-11-04 NOTE — Patient Instructions (Signed)
Nice to see you. Please try decreasing your lisinopril dose to 5 mg once daily.  You can take half a tablet to accomplish this.  Please send me a list of your blood pressure readings in 2 weeks. Please continue with diet and exercise.

## 2019-11-04 NOTE — Assessment & Plan Note (Signed)
A1c stable.  Continue diet and exercise.

## 2019-11-11 ENCOUNTER — Encounter: Payer: Self-pay | Admitting: Orthopaedic Surgery

## 2019-11-11 ENCOUNTER — Other Ambulatory Visit: Payer: Self-pay

## 2019-11-11 DIAGNOSIS — M25552 Pain in left hip: Secondary | ICD-10-CM

## 2019-11-11 NOTE — Telephone Encounter (Signed)
Ok to order 

## 2019-11-15 ENCOUNTER — Other Ambulatory Visit: Payer: Self-pay

## 2019-11-15 DIAGNOSIS — M25552 Pain in left hip: Secondary | ICD-10-CM

## 2019-11-21 ENCOUNTER — Other Ambulatory Visit: Payer: Self-pay

## 2019-11-21 ENCOUNTER — Encounter: Payer: Self-pay | Admitting: Family Medicine

## 2019-11-21 MED ORDER — PEG 3350-KCL-NA BICARB-NACL 420 G PO SOLR: 4000.0000 mL | Freq: Once | ORAL | 0 refills | Status: AC

## 2019-11-23 ENCOUNTER — Other Ambulatory Visit
Admission: RE | Admit: 2019-11-23 | Discharge: 2019-11-23 | Disposition: A | Discharge status: Home / Self Care | Payer: Medicare Other | Source: Ambulatory Visit | Attending: Gastroenterology | Admitting: Gastroenterology

## 2019-11-23 ENCOUNTER — Other Ambulatory Visit: Payer: Self-pay

## 2019-11-23 DIAGNOSIS — Z01812 Encounter for preprocedural laboratory examination: Secondary | ICD-10-CM | POA: Insufficient documentation

## 2019-11-23 DIAGNOSIS — Z20822 Contact with and (suspected) exposure to covid-19: Secondary | ICD-10-CM | POA: Diagnosis not present

## 2019-11-23 LAB — SARS CORONAVIRUS 2 (TAT 6-24 HRS): SARS Coronavirus 2: NEGATIVE

## 2019-11-25 ENCOUNTER — Ambulatory Visit
Admission: RE | Admit: 2019-11-25 | Discharge: 2019-11-25 | Disposition: A | Discharge status: Home / Self Care | Payer: Medicare Other | Attending: Gastroenterology | Admitting: Gastroenterology

## 2019-11-25 ENCOUNTER — Encounter: Payer: Self-pay | Admitting: Gastroenterology

## 2019-11-25 ENCOUNTER — Ambulatory Visit: Payer: Medicare Other | Admitting: Certified Registered Nurse Anesthetist

## 2019-11-25 ENCOUNTER — Encounter
Admission: RE | Disposition: A | Discharge status: Home / Self Care | Payer: Self-pay | Source: Home / Self Care | Attending: Gastroenterology

## 2019-11-25 ENCOUNTER — Other Ambulatory Visit: Payer: Self-pay

## 2019-11-25 DIAGNOSIS — K635 Polyp of colon: Secondary | ICD-10-CM | POA: Diagnosis not present

## 2019-11-25 DIAGNOSIS — I10 Essential (primary) hypertension: Secondary | ICD-10-CM | POA: Diagnosis not present

## 2019-11-25 DIAGNOSIS — Z79899 Other long term (current) drug therapy: Secondary | ICD-10-CM | POA: Diagnosis not present

## 2019-11-25 DIAGNOSIS — K5790 Diverticulosis of intestine, part unspecified, without perforation or abscess without bleeding: Secondary | ICD-10-CM | POA: Diagnosis not present

## 2019-11-25 DIAGNOSIS — D120 Benign neoplasm of cecum: Secondary | ICD-10-CM | POA: Diagnosis not present

## 2019-11-25 DIAGNOSIS — K5730 Diverticulosis of large intestine without perforation or abscess without bleeding: Secondary | ICD-10-CM | POA: Insufficient documentation

## 2019-11-25 DIAGNOSIS — Z1211 Encounter for screening for malignant neoplasm of colon: Secondary | ICD-10-CM

## 2019-11-25 DIAGNOSIS — Z96651 Presence of right artificial knee joint: Secondary | ICD-10-CM | POA: Insufficient documentation

## 2019-11-25 HISTORY — PX: COLONOSCOPY WITH PROPOFOL: SHX5780

## 2019-11-25 SURGERY — COLONOSCOPY WITH PROPOFOL
Anesthesia: General

## 2019-11-25 MED ORDER — PROPOFOL 10 MG/ML IV BOLUS
INTRAVENOUS | Status: DC | PRN
  Administered 2019-11-25: 60 mg via INTRAVENOUS
  Administered 2019-11-25 (×2): 20 mg via INTRAVENOUS

## 2019-11-25 MED ORDER — SODIUM CHLORIDE 0.9 % IV SOLN: INTRAVENOUS | Status: DC

## 2019-11-25 MED ORDER — PROPOFOL 500 MG/50ML IV EMUL
INTRAVENOUS | Status: DC | PRN
  Administered 2019-11-25: 175 ug/kg/min via INTRAVENOUS

## 2019-11-25 MED ORDER — LIDOCAINE HCL (PF) 2 % IJ SOLN
INTRAMUSCULAR | Status: AC
  Filled 2019-11-25: qty 5

## 2019-11-25 MED ORDER — LIDOCAINE HCL (CARDIAC) PF 100 MG/5ML IV SOSY
PREFILLED_SYRINGE | INTRAVENOUS | Status: DC | PRN
  Administered 2019-11-25: 50 mg via INTRAVENOUS

## 2019-11-25 MED ORDER — PROPOFOL 500 MG/50ML IV EMUL
INTRAVENOUS | Status: AC
  Filled 2019-11-25: qty 50

## 2019-11-25 MED ORDER — PHENYLEPHRINE HCL (PRESSORS) 10 MG/ML IV SOLN
INTRAVENOUS | Status: AC
  Filled 2019-11-25: qty 1

## 2019-11-25 NOTE — Op Note (Signed)
[facility] Patient Name: Katherine Stanley Procedure Date: 11/25/2019 7:58 AM MRN: [id number] Account #: [id number] Date of Birth: [birth year] Admit Type: Outpatient Age: 74 Room: [facility] ENDO ROOM 4 Gender: Female Note Status: Finalized Procedure:             Colonoscopy Indications:           Screening for colorectal malignant neoplasm Providers:             Quaran Kedzierski B. Bonna Gains MD, MD Medicines:             Monitored Anesthesia Care Complications:         No immediate complications. Procedure:             Pre-Anesthesia Assessment:                        - ASA Grade Assessment: II - A patient with mild                         systemic disease.                        - Prior to the procedure, a History and Physical was                         performed, and patient medications, allergies and                         sensitivities were reviewed. The patient's tolerance                         of previous anesthesia was reviewed.                        - The risks and benefits of the procedure and the                         sedation options and risks were discussed with the                         patient. All questions were answered and informed                         consent was obtained.                        - Patient identification and proposed procedure were                         verified prior to the procedure by the physician, the                         nurse, the anesthesiologist, the anesthetist and the                         technician. The procedure was verified in the                         procedure room.  After obtaining informed consent, the colonoscope was                         passed under direct vision. Throughout the procedure,                         the patient's blood pressure, pulse, and oxygen                         saturations were monitored continuously. The                         Colonoscope  was introduced through the anus and                         advanced to the the cecum, identified by appendiceal                         orifice and ileocecal valve. The colonoscopy was                         performed with ease. The patient tolerated the                         procedure well. The quality of the bowel preparation                         was good. Findings:      The perianal and digital rectal examinations were normal.      Two sessile polyps were found in the ascending colon and cecum. The       polyps were 3 to 4 mm in size. These polyps were removed with a jumbo       cold forceps. Resection and retrieval were complete.      Multiple diverticula were found in the entire colon.      The exam was otherwise without abnormality.      The rectum, sigmoid colon, descending colon, transverse colon, ascending       colon and cecum appeared normal.      The retroflexed view of the distal rectum and anal verge was normal and       showed no anal or rectal abnormalities. Impression:            - Two 3 to 4 mm polyps in the ascending colon and in                         the cecum, removed with a jumbo cold forceps. Resected                         and retrieved.                        - Diverticulosis in the entire examined colon.                        - The examination was otherwise normal.                        - The rectum, sigmoid colon, descending colon,  transverse colon, ascending colon and cecum are normal.                        - The distal rectum and anal verge are normal on                         retroflexion view. Recommendation:        - Discharge patient to home (with escort).                        - High fiber diet.                        - Advance diet as tolerated.                        - Continue present medications.                        - Await pathology results.                        - Repeat colonoscopy in 5 years if polyps  show                         adenoma, 10 years if they are hyperplastic.                        - The findings and recommendations were discussed with                         the patient.                        - The findings and recommendations were discussed with                         the patient's family.                        - Return to primary care physician as previously                         scheduled. Procedure Code(s):     --- Professional ---                        [id number], Colonoscopy, flexible; with biopsy, single or                         multiple Diagnosis Code(s):     --- Professional ---                        Z12.11, Encounter for screening for malignant neoplasm                         of colon                        K63.5, Polyp of colon CPT copyright 2019 American Medical Association. All rights reserved. The codes documented in this report are preliminary and  upon coder review may  be revised to meet current compliance requirements.  [name], MD Margretta Sidle B. Bonna Gains MD, MD 11/25/2019 8:51:35 AM This report has been signed electronically. Number of Addenda: 0 Note Initiated On: 11/25/2019 7:58 AM Scope Withdrawal Time: 0 hours 21 minutes 49 seconds  Total Procedure Duration: 0 hours 24 minutes 3 seconds  Estimated Blood Loss:  Estimated blood loss: none.      [facility]

## 2019-11-25 NOTE — H&P (Signed)
Katherine Antigua, MD [address] [place], [place], [place], [address] [address], [place], [place], [place], [address] Phone: [phone]  Fax: [phone]  Primary Care Physician:  [name], MD   Pre-Procedure History & Physical: HPI:  Katherine Stanley is a 74 y.o. female is here for a colonoscopy.   Past Medical History:  Diagnosis Date  . Arthritis   . Diverticulosis   . Headache    occasionally  . Hypertension    takes Lisinopril daily  . Joint pain   . Osteopenia   . Rosacea     Past Surgical History:  Procedure Laterality Date  . CHOLECYSTECTOMY    . CHOLECYSTECTOMY    . COLONOSCOPY    . EYE SURGERY  2013   LASIK  . REFRACTIVE SURGERY    . SHOULDER ARTHROSCOPY WITH OPEN ROTATOR CUFF REPAIR AND DISTAL CLAVICLE ACROMINECTOMY Right 09/19/2014   Procedure: SHOULDER ARTHROSCOPY WITH MINI-OPEN ROTATOR CUFF REPAIR AND DISTAL CLAVICLE RESECTION, SUBACROMIAL DECOMPRESSION;  Surgeon: Garald Balding, MD;  Location: [facility];  Service: Orthopedics;  Laterality: Right;  . TONSILLECTOMY  at age 34  . 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 TOTAL KNEE ARTHROPLASTY Right 03/18/2016   Procedure: TOTAL KNEE ARTHROPLASTY;  Surgeon: Garald Balding, MD;  Location: [facility];  Service: Orthopedics;  Laterality: Right;  Marland Kitchen VAGINAL HYSTERECTOMY  at age 73    Prior to Admission medications   Medication Sig Start Date End Date Taking? Authorizing Provider  cholecalciferol (VITAMIN D) 1000 units tablet Take 1,000 Units by mouth daily.   Yes [provider]  lisinopril (ZESTRIL) 10 MG tablet Take 0.5 tablets (5 mg total) by mouth daily. 11/04/19  Yes [name], MD  Lysine 1000 MG TABS Take 1,000 mg by mouth daily as needed (for fever blisters).   Yes [provider]  Multiple Vitamins-Minerals (EYE VITAMINS PO) Take by mouth.   Yes [provider]  Nutritional Supplements (NUTRITIONAL SUPPLEMENT PO) Take 1 tablet by mouth daily.   Yes  [provider]  prednisoLONE acetate (PRED FORTE) 1 % ophthalmic suspension  08/15/19  Yes [provider]  Probiotic Product (PROBIOTIC DAILY PO) Take 1 capsule by mouth daily.    Yes [provider]  Propylene Glycol-Glycerin (SOOTHE) 0.6-0.6 % SOLN Place 1 drop into both eyes daily.   Yes [provider]  vitamin B-12 (CYANOCOBALAMIN) 1000 MCG tablet Take 1,000 mcg by mouth daily.   Yes [provider]  acetaminophen (TYLENOL) 500 MG tablet Take 1,000 mg by mouth every 6 (six) hours as needed (for pain.).    [provider]    Allergies as of 09/19/2019 - Review Complete 09/19/2019  Allergen Reaction Noted  . Alendronate Other (See Comments) 10/01/2012  . Aleve [naproxen sodium] Nausea And Vomiting 09/07/2014    Family History  Problem Relation Age of Onset  . Hypertension Mother   . Heart disease Mother   . Kidney disease Mother   . Diabetes Father   . Hypertension Father   . Heart disease Father   . Heart disease Brother   . Cancer Maternal Aunt   . Ovarian cancer Maternal Aunt 85  . Stroke Brother   . Alzheimer's disease Brother   . Diabetes Paternal Grandmother   . Breast cancer Neg Hx     Social History   Socioeconomic History  . Marital status: Married    Spouse name: Not on file  . Number of children: Not on file  .  Years of education: Not on file  . Highest education level: Not on file  Occupational History  . Not on file  Tobacco Use  . Smoking status: Never Smoker  . Smokeless tobacco: Never Used  Substance and Sexual Activity  . Alcohol use: No    Alcohol/week: 0.0 standard drinks  . Drug use: No  . Sexual activity: Yes    Birth control/protection: Post-menopausal, Surgical  Other Topics Concern  . Not on file  Social History Narrative   Lives in [place] with husband. No children. No pets.      Work - retired Sales promotion account executive   Diet - regular   Exercise - golf, gardening   Social  Determinants of Radio broadcast assistant Strain:   . Difficulty of Paying Living Expenses:   Food Insecurity:   . Worried About Charity fundraiser in the Last Year:   . Arboriculturist in the Last Year:   Transportation Needs:   . Film/video editor (Medical):   Marland Kitchen Lack of Transportation (Non-Medical):   Physical Activity:   . Days of Exercise per Week:   . Minutes of Exercise per Session:   Stress:   . Feeling of Stress :   Social Connections:   . Frequency of Communication with Friends and Family:   . Frequency of Social Gatherings with Friends and Family:   . Attends Religious Services:   . Active Member of Clubs or Organizations:   . Attends Archivist Meetings:   Marland Kitchen Marital Status:   Intimate Partner Violence:   . Fear of Current or Ex-Partner:   . Emotionally Abused:   Marland Kitchen Physically Abused:   . Sexually Abused:     Review of Systems: See HPI, otherwise negative ROS  Physical Exam: BP (!) 141/105   Pulse (!) 106   Temp 98.6 F (37 C) (Temporal)   Resp 18   Ht 5' (1.524 m)   Wt 74.8 kg   SpO2 98%   BMI 32.22 kg/m  General:   Alert,  pleasant and cooperative in NAD Head:  Normocephalic and atraumatic. Neck:  Supple; no masses or thyromegaly. Lungs:  Clear throughout to auscultation, normal respiratory effort.    Heart:  +S1, +S2, Regular rate and rhythm, No edema. Abdomen:  Soft, nontender and nondistended. Normal bowel sounds, without guarding, and without rebound.   Neurologic:  Alert and  oriented x4;  grossly normal neurologically.  Impression/Plan: [name] is here for a colonoscopy to be performed for average risk screening.  Risks, benefits, limitations, and alternatives regarding  colonoscopy have been reviewed with the patient.  Questions have been answered.  All parties agreeable.   Virgel Manifold, MD  11/25/2019, 8:05 AM

## 2019-11-25 NOTE — Transfer of Care (Signed)
Immediate Anesthesia Transfer of Care Note  Patient: SHANEEQUA BAHNER  Procedure(s) Performed: COLONOSCOPY WITH PROPOFOL (N/A )  Patient Location: PACU  Anesthesia Type:General  Level of Consciousness: awake and alert   Airway & Oxygen Therapy: Patient Spontanous Breathing and Patient connected to nasal cannula oxygen  Post-op Assessment: Report given to RN and Post -op Vital signs reviewed and stable  Post vital signs: Reviewed and stable  Last Vitals:  Vitals Value Taken Time  BP    Temp    Pulse 88 11/25/19 0852  Resp 13 11/25/19 0852  SpO2 98 % 11/25/19 0852    Last Pain:  Vitals:   11/25/19 0727  TempSrc: Temporal  PainSc: 0-No pain         Complications: No complications documented.

## 2019-11-25 NOTE — Anesthesia Procedure Notes (Signed)
Date/Time: 11/25/2019 8:05 AM Performed by: Johnna Acosta, CRNA Pre-anesthesia Checklist: Patient identified, Emergency Drugs available, Patient being monitored, Suction available and Timeout performed Patient Re-evaluated:Patient Re-evaluated prior to induction Oxygen Delivery Method: Nasal cannula Preoxygenation: Pre-oxygenation with 100% oxygen Induction Type: IV induction

## 2019-11-25 NOTE — Anesthesia Postprocedure Evaluation (Signed)
Anesthesia Post Note  Patient: Katherine Stanley  Procedure(s) Performed: COLONOSCOPY WITH PROPOFOL (N/A )  Patient location during evaluation: Endoscopy Anesthesia Type: General Level of consciousness: awake and alert and oriented Pain management: pain level controlled Vital Signs Assessment: post-procedure vital signs reviewed and stable Respiratory status: spontaneous breathing, nonlabored ventilation and respiratory function stable Cardiovascular status: blood pressure returned to baseline and stable Postop Assessment: no signs of nausea or vomiting Anesthetic complications: no   No complications documented.   Last Vitals:  Vitals:   11/25/19 0852 11/25/19 0853  BP:  112/72  Pulse: 88 84  Resp: 13 16  Temp:    SpO2: 98% 96%    Last Pain:  Vitals:   11/25/19 0911  TempSrc:   PainSc: 0-No pain                 Shareena Nusz

## 2019-11-25 NOTE — Anesthesia Preprocedure Evaluation (Signed)
Anesthesia Evaluation  Patient identified by MRN, date of birth, ID band Patient awake    Reviewed: Allergy & Precautions, NPO status , Patient's Chart, lab work & pertinent test results  History of Anesthesia Complications Negative for: history of anesthetic complications  Airway Mallampati: II  TM Distance: >3 FB Neck ROM: Full    Dental no notable dental hx.    Pulmonary neg pulmonary ROS, neg sleep apnea, neg COPD,    breath sounds clear to auscultation- rhonchi (-) wheezing      Cardiovascular Exercise Tolerance: Good hypertension, Pt. on medications (-) CAD, (-) Past MI, (-) Cardiac Stents and (-) CABG  Rhythm:Regular Rate:Normal - Systolic murmurs and - Diastolic murmurs    Neuro/Psych  Headaches, neg Seizures negative psych ROS   GI/Hepatic negative GI ROS, Neg liver ROS,   Endo/Other  negative endocrine ROSneg diabetes  Renal/GU negative Renal ROS     Musculoskeletal  (+) Arthritis ,   Abdominal (+) + obese,   Peds  Hematology negative hematology ROS (+)   Anesthesia Other Findings Past Medical History: No date: Arthritis No date: Diverticulosis No date: Headache     Comment:  occasionally No date: Hypertension     Comment:  takes Lisinopril daily No date: Joint pain No date: Osteopenia No date: Rosacea   Reproductive/Obstetrics                             Anesthesia Physical Anesthesia Plan  ASA: II  Anesthesia Plan: General   Post-op Pain Management:    Induction: Intravenous  PONV Risk Score and Plan: 2 and Propofol infusion  Airway Management Planned: Natural Airway  Additional Equipment:   Intra-op Plan:   Post-operative Plan:   Informed Consent: I have reviewed the patients History and Physical, chart, labs and discussed the procedure including the risks, benefits and alternatives for the proposed anesthesia with the patient or authorized  representative who has indicated his/her understanding and acceptance.     Dental advisory given  Plan Discussed with: CRNA and Anesthesiologist  Anesthesia Plan Comments:         Anesthesia Quick Evaluation

## 2019-11-28 ENCOUNTER — Encounter: Payer: Self-pay | Admitting: Gastroenterology

## 2019-11-28 LAB — SURGICAL PATHOLOGY

## 2019-12-07 ENCOUNTER — Other Ambulatory Visit: Payer: Self-pay

## 2019-12-07 ENCOUNTER — Ambulatory Visit
Admission: RE | Admit: 2019-12-07 | Discharge: 2019-12-07 | Disposition: A | Discharge status: Home / Self Care | Payer: Medicare Other | Source: Ambulatory Visit | Attending: Orthopaedic Surgery | Admitting: Orthopaedic Surgery

## 2019-12-07 DIAGNOSIS — M25552 Pain in left hip: Secondary | ICD-10-CM

## 2019-12-07 DIAGNOSIS — M1612 Unilateral primary osteoarthritis, left hip: Secondary | ICD-10-CM | POA: Diagnosis not present

## 2019-12-07 MED ORDER — IOHEXOL 180 MG/ML  SOLN
50.0000 mL | Freq: Once | INTRAMUSCULAR | Status: AC | PRN
  Administered 2019-12-07: 10 mL

## 2019-12-07 MED ORDER — LIDOCAINE HCL (PF) 1 % IJ SOLN
30.0000 mL | Freq: Once | INTRAMUSCULAR | Status: AC
  Administered 2019-12-07: 10 mL via INTRADERMAL
  Filled 2019-12-07: qty 30

## 2019-12-07 MED ORDER — ROPIVACAINE HCL 5 MG/ML IJ SOLN
30.0000 mL | Freq: Once | INTRAMUSCULAR | Status: AC
  Administered 2019-12-07: 7 mL via EPIDURAL
  Filled 2019-12-07: qty 30

## 2019-12-07 MED ORDER — TRIAMCINOLONE ACETONIDE 40 MG/ML IJ SUSP (RADIOLOGY)
40.0000 mg | Freq: Once | INTRAMUSCULAR | Status: AC
  Administered 2019-12-07: 40 mg via INTRA_ARTICULAR

## 2019-12-16 ENCOUNTER — Encounter: Payer: Self-pay | Admitting: Orthopaedic Surgery

## 2020-01-27 ENCOUNTER — Other Ambulatory Visit: Payer: Self-pay

## 2020-01-27 ENCOUNTER — Ambulatory Visit (INDEPENDENT_AMBULATORY_CARE_PROVIDER_SITE_OTHER): Payer: Medicare Other

## 2020-01-27 DIAGNOSIS — Z23 Encounter for immunization: Secondary | ICD-10-CM

## 2020-02-14 ENCOUNTER — Ambulatory Visit (INDEPENDENT_AMBULATORY_CARE_PROVIDER_SITE_OTHER): Payer: Medicare Other | Admitting: Orthopaedic Surgery

## 2020-02-14 ENCOUNTER — Other Ambulatory Visit: Payer: Self-pay

## 2020-02-14 ENCOUNTER — Encounter: Payer: Self-pay | Admitting: Orthopaedic Surgery

## 2020-02-14 VITALS — Ht 60.25 in | Wt 165.0 lb

## 2020-02-14 DIAGNOSIS — M1712 Unilateral primary osteoarthritis, left knee: Secondary | ICD-10-CM | POA: Diagnosis not present

## 2020-02-14 MED ORDER — BUPIVACAINE HCL 0.25 % IJ SOLN
2.0000 mL | INTRAMUSCULAR | Status: AC | PRN
  Administered 2020-02-14: 2 mL via INTRA_ARTICULAR

## 2020-02-14 MED ORDER — LIDOCAINE HCL 1 % IJ SOLN
2.0000 mL | INTRAMUSCULAR | Status: AC | PRN
  Administered 2020-02-14: 2 mL

## 2020-02-14 NOTE — Progress Notes (Signed)
Office Visit Note   Patient: Katherine Stanley           Date of Birth: 1945/11/06           MRN: [id number] Visit Date: 02/14/2020              Requested by: [name], MD [address] [place],   [id number] PCP: [name], MD   Assessment & Plan: Visit Diagnoses:  1. Unilateral primary osteoarthritis, left knee     Plan:  #1: Corticosteroid injection was given without difficulty to the left knee #2: Follow back up as needed  Follow-Up Instructions: Return if symptoms worsen or fail to improve.   Orders:  No orders of the defined types were placed in this encounter.  No orders of the defined types were placed in this encounter.     Procedures: Large Joint Inj: L knee on 02/14/2020 3:21 PM Indications: pain and diagnostic evaluation Details: 25 G 1.5 in needle, anteromedial approach  Arthrogram: No  Medications: 2 mL lidocaine 1 %; 2 mL bupivacaine 0.25 % Outcome: tolerated well, no immediate complications  2 ml betamethasone Procedure, treatment alternatives, risks and benefits explained, specific risks discussed. Consent was given by the patient. Patient was prepped and draped in the usual sterile fashion.       Clinical Data: No additional findings.   Subjective: Chief Complaint  Patient presents with  . Left Knee - Pain   HPI Patient presents today for recurrent left knee pain. She was last here on 09/13/2019 and received a cortisone injection. She said that she has been hurting for quite awhile. She takes Advil or Tylenol as needed. She is wanting another cortisone injection today. She enjoys golfing. She wants to also have a left hip IA injection ordered and to be done at [facility].    Review of Systems  Constitutional: Negative for fatigue.  HENT: Negative for ear pain.   Eyes: Negative for pain.  Respiratory: Negative for shortness of breath.   Cardiovascular: Negative for leg swelling.  Gastrointestinal: Negative  for constipation and diarrhea.  Endocrine: Negative for cold intolerance and heat intolerance.  Genitourinary: Negative for difficulty urinating.  Musculoskeletal: Negative for joint swelling.  Skin: Negative for rash.  Allergic/Immunologic: Negative for food allergies.  Neurological: Negative for weakness.  Hematological: Does not bruise/bleed easily.  Psychiatric/Behavioral: Negative for sleep disturbance.     Objective: Vital Signs: Ht 5' 0.25" (1.53 m)   Wt 165 lb (74.8 kg)   BMI 31.96 kg/m   Physical Exam Constitutional:      Appearance: Normal appearance. She is obese.  HENT:     Head: Normocephalic.     Nose: Nose normal.     Mouth/Throat:     Mouth: Mucous membranes are moist.     Pharynx: Oropharynx is clear.  Eyes:     Extraocular Movements: Extraocular movements intact.  Skin:    General: Skin is warm and dry.  Neurological:     Mental Status: She is alert.  Psychiatric:        Mood and Affect: Mood normal.        Behavior: Behavior normal.     Ortho Exam  Exam today reveals a trace effusion.  She does have crepitance with range of motion.  Calf is supple nontender.  Neurovascular intact distally.  She does have some pseudolaxity with varus and valgus stressing.    Specialty Comments:  No specialty comments available.  Imaging: No  results found.   PMFS History: Current Outpatient Medications  Medication Sig Dispense Refill  . acetaminophen (TYLENOL) 500 MG tablet Take 1,000 mg by mouth every 6 (six) hours as needed (for pain.).    Marland Kitchen cholecalciferol (VITAMIN D) 1000 units tablet Take 1,000 Units by mouth daily.    Marland Kitchen lisinopril (ZESTRIL) 10 MG tablet Take 0.5 tablets (5 mg total) by mouth daily. 45 tablet 1  . Lysine 1000 MG TABS Take 1,000 mg by mouth daily as needed (for fever blisters).    . Multiple Vitamins-Minerals (EYE VITAMINS PO) Take by mouth.    . Nutritional Supplements (NUTRITIONAL SUPPLEMENT PO) Take 1 tablet by mouth daily.    .  prednisoLONE acetate (PRED FORTE) 1 % ophthalmic suspension     . Probiotic Product (PROBIOTIC DAILY PO) Take 1 capsule by mouth daily.     Marland Kitchen Propylene Glycol-Glycerin (SOOTHE) 0.6-0.6 % SOLN Place 1 drop into both eyes daily.    . vitamin B-12 (CYANOCOBALAMIN) 1000 MCG tablet Take 1,000 mcg by mouth daily.     No current facility-administered medications for this visit.    Patient Active Problem List   Diagnosis Date Noted  . Unilateral primary osteoarthritis, left knee 09/13/2019  . Discomfort of back 04/20/2019  . Prediabetes 03/19/2018  . Unilateral primary osteoarthritis, right knee 03/18/2016  . S/P total knee replacement using cement, right 03/18/2016  . 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 Osteopenia 08/23/2012  . 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 Hypertension 10/27/2011  . Rosacea    Past Medical History:  Diagnosis Date  . Arthritis   . Diverticulosis   . Headache    occasionally  . Hypertension    takes Lisinopril daily  . Joint pain   . Osteopenia   . Rosacea     Family History  Problem Relation Age of Onset  . Hypertension Mother   . Heart disease Mother   . Kidney disease Mother   . Diabetes Father   . Hypertension Father   . Heart disease Father   . Heart disease Brother   . Cancer Maternal Aunt   . Ovarian cancer Maternal Aunt 85  . Stroke Brother   . Alzheimer's disease Brother   . Diabetes Paternal Grandmother   . Breast cancer Neg Hx     Past Surgical History:  Procedure Laterality Date  . CHOLECYSTECTOMY    . CHOLECYSTECTOMY    . COLONOSCOPY    . COLONOSCOPY WITH PROPOFOL N/A 11/25/2019   Procedure: COLONOSCOPY WITH PROPOFOL;  Surgeon: Virgel Manifold, MD;  Location: [facility];  Service: Gastroenterology;  Laterality: N/A;  . EYE SURGERY  2013   LASIK  . REFRACTIVE SURGERY    . SHOULDER ARTHROSCOPY WITH OPEN ROTATOR CUFF REPAIR AND DISTAL CLAVICLE ACROMINECTOMY Right 09/19/2014   Procedure: SHOULDER ARTHROSCOPY WITH  MINI-OPEN ROTATOR CUFF REPAIR AND DISTAL CLAVICLE RESECTION, SUBACROMIAL DECOMPRESSION;  Surgeon: Garald Balding, MD;  Location: [facility];  Service: Orthopedics;  Laterality: Right;  . TONSILLECTOMY  at age 42  . 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 TOTAL KNEE ARTHROPLASTY Right 03/18/2016   Procedure: TOTAL KNEE ARTHROPLASTY;  Surgeon: Garald Balding, MD;  Location: [facility];  Service: Orthopedics;  Laterality: Right;  Marland Kitchen VAGINAL HYSTERECTOMY  at age 33   Social History   Occupational History  . Not on file  Tobacco Use  . Smoking status: Never Smoker  . Smokeless tobacco: Never Used  Substance and Sexual Activity  . Alcohol use: No  Alcohol/week: 0.0 standard drinks  . Drug use: No  . Sexual activity: Yes    Birth control/protection: Post-menopausal, Surgical

## 2020-02-20 ENCOUNTER — Encounter: Payer: Self-pay | Admitting: Orthopaedic Surgery

## 2020-02-20 ENCOUNTER — Other Ambulatory Visit: Payer: Self-pay

## 2020-02-20 DIAGNOSIS — M25552 Pain in left hip: Secondary | ICD-10-CM

## 2020-03-21 ENCOUNTER — Ambulatory Visit
Admission: RE | Admit: 2020-03-21 | Discharge: 2020-03-21 | Disposition: A | Discharge status: Home / Self Care | Payer: Medicare Other | Source: Ambulatory Visit | Attending: Orthopaedic Surgery | Admitting: Orthopaedic Surgery

## 2020-03-21 ENCOUNTER — Other Ambulatory Visit: Payer: Self-pay

## 2020-03-21 DIAGNOSIS — M25552 Pain in left hip: Secondary | ICD-10-CM

## 2020-03-21 MED ORDER — LIDOCAINE HCL (PF) 1 % IJ SOLN
5.0000 mL | Freq: Once | INTRAMUSCULAR | Status: AC
  Administered 2020-03-21: 5 mL
  Filled 2020-03-21: qty 5

## 2020-03-21 MED ORDER — ROPIVACAINE HCL 5 MG/ML IJ SOLN
INTRAMUSCULAR | Status: AC
  Administered 2020-03-21: 7 mL
  Filled 2020-03-21: qty 30

## 2020-03-21 MED ORDER — TRIAMCINOLONE ACETONIDE 40 MG/ML IJ SUSP (RADIOLOGY): 40.0000 mg | Freq: Once | INTRAMUSCULAR | Status: DC

## 2020-03-21 MED ORDER — IOHEXOL 180 MG/ML  SOLN
20.0000 mL | Freq: Once | INTRAMUSCULAR | Status: AC | PRN
  Administered 2020-03-21: 20 mL

## 2020-03-21 MED ORDER — TRIAMCINOLONE ACETONIDE 40 MG/ML IJ SUSP
INTRAMUSCULAR | Status: AC
  Administered 2020-03-21: 40 mg
  Filled 2020-03-21: qty 1

## 2020-03-21 MED ORDER — ROPIVACAINE HCL 5 MG/ML IJ SOLN
30.0000 mL | Freq: Once | INTRAMUSCULAR | Status: DC
  Filled 2020-03-21: qty 30

## 2020-06-07 ENCOUNTER — Other Ambulatory Visit: Payer: Self-pay

## 2020-06-07 ENCOUNTER — Encounter: Payer: Self-pay | Admitting: Orthopaedic Surgery

## 2020-06-07 DIAGNOSIS — M25552 Pain in left hip: Secondary | ICD-10-CM

## 2020-06-07 NOTE — Progress Notes (Signed)
IMG 

## 2020-06-07 NOTE — Telephone Encounter (Signed)
Ok to prescribe

## 2020-06-25 ENCOUNTER — Ambulatory Visit (INDEPENDENT_AMBULATORY_CARE_PROVIDER_SITE_OTHER): Payer: Medicare Other | Admitting: Dermatology

## 2020-06-25 ENCOUNTER — Other Ambulatory Visit: Payer: Self-pay

## 2020-06-25 DIAGNOSIS — D229 Melanocytic nevi, unspecified: Secondary | ICD-10-CM | POA: Diagnosis not present

## 2020-06-25 DIAGNOSIS — D692 Other nonthrombocytopenic purpura: Secondary | ICD-10-CM

## 2020-06-25 DIAGNOSIS — Z1283 Encounter for screening for malignant neoplasm of skin: Secondary | ICD-10-CM

## 2020-06-25 DIAGNOSIS — L821 Other seborrheic keratosis: Secondary | ICD-10-CM | POA: Diagnosis not present

## 2020-06-25 DIAGNOSIS — L820 Inflamed seborrheic keratosis: Secondary | ICD-10-CM | POA: Diagnosis not present

## 2020-06-25 DIAGNOSIS — D2239 Melanocytic nevi of other parts of face: Secondary | ICD-10-CM | POA: Diagnosis not present

## 2020-06-25 DIAGNOSIS — L918 Other hypertrophic disorders of the skin: Secondary | ICD-10-CM | POA: Diagnosis not present

## 2020-06-25 DIAGNOSIS — D1800 Hemangioma unspecified site: Secondary | ICD-10-CM | POA: Diagnosis not present

## 2020-06-25 DIAGNOSIS — L578 Other skin changes due to chronic exposure to nonionizing radiation: Secondary | ICD-10-CM | POA: Diagnosis not present

## 2020-06-25 DIAGNOSIS — L570 Actinic keratosis: Secondary | ICD-10-CM | POA: Diagnosis not present

## 2020-06-25 DIAGNOSIS — L814 Other melanin hyperpigmentation: Secondary | ICD-10-CM | POA: Diagnosis not present

## 2020-06-25 NOTE — Progress Notes (Addendum)
Follow-Up Visit   Subjective  Katherine Stanley is a 75 y.o. female who presents for the following: tbse (Patient here today for tbse. Patient states she has spot on left lower leg that she noticed several days. Patient states she also has a spot on right shoulder area that looks different. ).  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  Patient here for full body skin exam and skin cancer screening.  The following portions of the chart were reviewed this encounter and updated as appropriate:        Objective  Well appearing patient in no apparent distress; mood and affect are within normal limits.  A full examination was performed including scalp, head, eyes, ears, nose, lips, neck, chest, axillae, abdomen, back, buttocks, bilateral upper extremities, bilateral lower extremities, hands, feet, fingers, toes, fingernails, and toenails. All findings within normal limits unless otherwise noted below.  Objective  right lateral calf x 1, left pretibia x1 right medial calf x 1, right anterior shoulder x 1, right antecubital x 1 (5): Erythematous keratotic or waxy stuck-on papule or plaque.   Objective  right upper lip x1: Erythematous thin papules/macules with gritty scale.   Objective  neck: Fleshy, skin-colored pedunculated papules.    Objective  right paranasal: 6 mm pink flesh papule   Assessment & Plan  Inflamed seborrheic keratosis (5) right lateral calf x 1, left pretibia x1 right medial calf x 1, right anterior shoulder x 1, right antecubital x 1    Destruction of lesion - right lateral calf x 1, left pretibia x1 right medial calf x 1, right anterior shoulder x 1, right antecubital x 1  Destruction method: cryotherapy   Destruction method comment:  Electrodessication Informed consent: discussed and consent obtained   Lesion destroyed using liquid nitrogen: Yes   Region frozen until ice ball extended beyond lesion: Yes   Outcome: patient  tolerated procedure well with no complications   Post-procedure details: wound care instructions given   Additional details:  Prior to procedure, discussed risks of blister formation, small wound, skin dyspigmentation, or rare scar following cryotherapy.  Actinic keratosis right upper lip x1  Prior to procedure, discussed risks of blister formation, small wound, skin dyspigmentation, or rare scar following cryotherapy.    Destruction of lesion - right upper lip x1  Destruction method: cryotherapy   Informed consent: discussed and consent obtained   Lesion destroyed using liquid nitrogen: Yes   Region frozen until ice ball extended beyond lesion: Yes   Outcome: patient tolerated procedure well with no complications   Post-procedure details: wound care instructions given    Acrochordon neck  Benign, observe.    Nevus right paranasal  Benign-appearing.  Observation.  Call clinic for new or changing lesions.  Recommend daily use of broad spectrum spf 30+ sunscreen to sun-exposed areas.    Lentigines Back, face  - Scattered tan macules - Due to sun exposure - Benign-appering, observe - Recommend daily broad spectrum sunscreen SPF 30+ to sun-exposed areas, reapply every 2 hours as needed. - Call for any changes  Purpura - Chronic; persistent and recurrent.  Treatable, but not curable. Bilateral arms  - Violaceous macules and patches - Benign - Related to trauma, age, sun damage and/or use of blood thinners, chronic use of topical and/or oral steroids - Observe - Can use OTC arnica containing moisturizer such as Dermend Bruise Formula if desired - Call for worsening or other concerns  Seborrheic Keratoses Bilateral legs  -  Stuck-on, waxy, tan-brown papules and plaques  - Discussed benign etiology and prognosis. - Observe - Call for any changes  Melanocytic Nevi Back  - Tan-brown and/or pink-flesh-colored symmetric macules and papules - Benign appearing on exam  today - Observation - Call clinic for new or changing moles - Recommend daily use of broad spectrum spf 30+ sunscreen to sun-exposed areas.   Hemangiomas Back, abdomen  - Red papules - Discussed benign nature - Observe - Call for any changes  Actinic Damage Chest  - Chronic, secondary to cumulative UV/sun exposure - diffuse scaly erythematous macules with underlying dyspigmentation - Recommend daily broad spectrum sunscreen SPF 30+ to sun-exposed areas, reapply every 2 hours as needed.  - Call for new or changing lesions.  Skin cancer screening performed today.  Return in about 1 year (around 06/25/2021) for tbse.  I, Ruthell Rummage, CMA, am acting as scribe for Brendolyn Patty, MD.  Documentation: I have reviewed the above documentation for accuracy and completeness, and I agree with the above.  Brendolyn Patty MD

## 2020-06-25 NOTE — Patient Instructions (Signed)
Cryotherapy Aftercare  . Wash gently with soap and water everyday.   . Apply Vaseline and Band-Aid daily until healed.  Melanoma ABCDEs  Melanoma is the most dangerous type of skin cancer, and is the leading cause of death from skin disease.  You are more likely to develop melanoma if you:  Have light-colored skin, light-colored eyes, or red or blond hair  Spend a lot of time in the sun  Tan regularly, either outdoors or in a tanning bed  Have had blistering sunburns, especially during childhood  Have a close family member who has had a melanoma  Have atypical moles or large birthmarks  Early detection of melanoma is key since treatment is typically straightforward and cure rates are extremely high if we catch it early.   The first sign of melanoma is often a change in a mole or a new dark spot.  The ABCDE system is a way of remembering the signs of melanoma.  A for asymmetry:  The two halves do not match. B for border:  The edges of the growth are irregular. C for color:  A mixture of colors are present instead of an even brown color. D for diameter:  Melanomas are usually (but not always) greater than 6mm - the size of a pencil eraser. E for evolution:  The spot keeps changing in size, shape, and color.  Please check your skin once per month between visits. You can use a small mirror in front and a large mirror behind you to keep an eye on the back side or your body.   If you see any new or changing lesions before your next follow-up, please call to schedule a visit.  Please continue daily skin protection including broad spectrum sunscreen SPF 30+ to sun-exposed areas, reapplying every 2 hours as needed when you're outdoors.   Staying in the shade or wearing long sleeves, sun glasses (UVA+UVB protection) and wide brim hats (4-inch brim around the entire circumference of the hat) are also recommended for sun protection.    

## 2020-07-02 ENCOUNTER — Other Ambulatory Visit: Payer: Self-pay

## 2020-07-02 ENCOUNTER — Ambulatory Visit
Admission: RE | Admit: 2020-07-02 | Discharge: 2020-07-02 | Disposition: A | Discharge status: Home / Self Care | Payer: Medicare Other | Source: Ambulatory Visit | Attending: Orthopaedic Surgery | Admitting: Orthopaedic Surgery

## 2020-07-02 DIAGNOSIS — G8929 Other chronic pain: Secondary | ICD-10-CM | POA: Insufficient documentation

## 2020-07-02 DIAGNOSIS — M25552 Pain in left hip: Secondary | ICD-10-CM | POA: Diagnosis not present

## 2020-07-02 MED ORDER — TRIAMCINOLONE ACETONIDE 40 MG/ML IJ SUSP (RADIOLOGY): 40.0000 mg | Freq: Once | INTRAMUSCULAR | Status: DC

## 2020-07-02 MED ORDER — TRIAMCINOLONE ACETONIDE 40 MG/ML IJ SUSP
INTRAMUSCULAR | Status: AC
  Administered 2020-07-02: 80 mg
  Filled 2020-07-02: qty 2

## 2020-07-02 MED ORDER — LIDOCAINE HCL (PF) 1 % IJ SOLN
5.0000 mL | Freq: Once | INTRAMUSCULAR | Status: AC
  Administered 2020-07-02: 5 mL
  Filled 2020-07-02: qty 5

## 2020-07-02 MED ORDER — IOHEXOL 180 MG/ML  SOLN
1.0000 mL | Freq: Once | INTRAMUSCULAR | Status: AC | PRN
  Administered 2020-07-02: 1 mL

## 2020-07-02 MED ORDER — ROPIVACAINE HCL 5 MG/ML IJ SOLN
INTRAMUSCULAR | Status: AC
  Administered 2020-07-02: 7 mL
  Filled 2020-07-02: qty 30

## 2020-07-19 ENCOUNTER — Encounter: Payer: Self-pay | Admitting: Orthopaedic Surgery

## 2020-08-08 ENCOUNTER — Encounter: Payer: Self-pay | Admitting: Orthopaedic Surgery

## 2020-08-08 ENCOUNTER — Ambulatory Visit (INDEPENDENT_AMBULATORY_CARE_PROVIDER_SITE_OTHER): Payer: Medicare Other

## 2020-08-08 ENCOUNTER — Other Ambulatory Visit: Payer: Self-pay

## 2020-08-08 ENCOUNTER — Ambulatory Visit (INDEPENDENT_AMBULATORY_CARE_PROVIDER_SITE_OTHER): Payer: Medicare Other | Admitting: Orthopaedic Surgery

## 2020-08-08 VITALS — Ht 60.25 in | Wt 165.0 lb

## 2020-08-08 DIAGNOSIS — M25552 Pain in left hip: Secondary | ICD-10-CM | POA: Diagnosis not present

## 2020-08-08 DIAGNOSIS — M1612 Unilateral primary osteoarthritis, left hip: Secondary | ICD-10-CM | POA: Insufficient documentation

## 2020-08-08 NOTE — Progress Notes (Signed)
Office Visit Note   Patient: Katherine Stanley           Date of Birth: [birth year]           MRN: [id number] Visit Date: 08/08/2020              Requested by: [name], MD [address] [place],  [place] [address] PCP: [name], MD   Assessment & Plan: Visit Diagnoses:  1. Pain in left hip   2. 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  She has had progressive symptoms over the past several years to the point where she is now requesting intra-articular cortisone injections about every 3 months.  She supplements that with either Tylenol and/or Advil.  She has had more compromise of her activities i.e. working in the yard and even having trouble sleeping.  I think it is time that she should consider hip replacement.  After much discussion we will refer her to Dr. Ninfa Linden  Follow-Up Instructions: Return Will refer to Dr. Ninfa Linden for consideration of hip replacement.   Orders:  Orders Placed This Encounter  Procedures  . XR HIP UNILAT W OR W/O PELVIS 2-3 VIEWS LEFT   No orders of the defined types were placed in this encounter.     Procedures: No procedures performed   Clinical Data: No additional findings.   Subjective: Chief Complaint  Patient presents with  . Left Hip - Pain  Patient presents today for follow up on her left hip. She states that it does not seem to hurting anymore than usual. Her pain is on and off. She has noticed that it pops, and that concerns her. She said that it does not hurt when it pops, but just wanted to follow up about it. She receives [facility] injections in her hip at [facility]. Her last one was in March of 2022. She said that the injections are still very helpful but would like to increase those to every two months.   HPI  Review of Systems   Objective: Vital Signs: Ht 5' 0.25" (1.53 m)   Wt 165 lb (74.8 kg)   BMI 31.96 kg/m   Physical  Exam Constitutional:      Appearance: She is well-developed.  Eyes:     Pupils: Pupils are equal, round, and reactive to light.  Pulmonary:     Effort: Pulmonary effort is normal.  Skin:    General: Skin is warm and dry.  Neurological:     Mental Status: She is alert and oriented to person, place, and time.  Psychiatric:        Behavior: Behavior normal.     Ortho Exam awake alert and oriented x3.  Comfortable sitting.  There is some mild loss of internal and external rotation of the left hip compared to the right with pain at the extremes.  Not much pain with flexion or extension.  Neurologically intact.  No pain over the greater trochanter.  Straight leg raise negative.  Specialty Comments:  No specialty comments available.  Imaging: XR HIP UNILAT W OR W/O PELVIS 2-3 VIEWS LEFT  Result Date: 08/08/2020 AP pelvis and lateral of the left hip were obtained and compared to films performed in January 2020.  There are progressive degenerative changes in the left hip where now there is no joint space remaining and possibly some mild collapse of the superior femoral head.  These are  consistent with end-stage osteoarthritis.  Possible avascular necrosis    PMFS History: Patient Active Problem List   Diagnosis Date Noted  . Unilateral primary osteoarthritis, left hip 08/08/2020  . Unilateral primary osteoarthritis, left knee 09/13/2019  . Discomfort of back 04/20/2019  . Prediabetes 03/19/2018  . Unilateral primary osteoarthritis, right knee 03/18/2016  . S/P total knee replacement using cement, right 03/18/2016  . 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 Osteopenia 08/23/2012  . 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 Hypertension 10/27/2011  . Rosacea    Past Medical History:  Diagnosis Date  . 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   . Arthritis   . Diverticulosis   . Headache    occasionally  . Hypertension    takes Lisinopril daily  . Joint  pain   . Osteopenia   . Rosacea     Family History  Problem Relation Age of Onset  . Hypertension Mother   . Heart disease Mother   . Kidney disease Mother   . Diabetes Father   . Hypertension Father   . Heart disease Father   . Heart disease Brother   . Cancer Maternal Aunt   . Ovarian cancer Maternal Aunt 85  . Stroke Brother   . Alzheimer's disease Brother   . Diabetes Paternal Grandmother   . Breast cancer Neg Hx     Past Surgical History:  Procedure Laterality Date  . CHOLECYSTECTOMY    . CHOLECYSTECTOMY    . COLONOSCOPY    . COLONOSCOPY WITH PROPOFOL N/A 11/25/2019   Procedure: COLONOSCOPY WITH PROPOFOL;  Surgeon: Virgel Manifold, MD;  Location: [facility] END[facility]COPY;  Service: Gastroenterology;  Laterality: N/A;  . EYE SURGERY  2013   LASIK  . REFRACTIVE SURGERY    . SHOULDER ARTHROSCOPY WITH OPEN ROTATOR CUFF REPAIR AND DISTAL CLAVICLE ACROMINECTOMY Right 09/19/2014   Procedure: SHOULDER ARTHROSCOPY WITH MINI-OPEN ROTATOR CUFF REPAIR AND DISTAL CLAVICLE RESECTION, SUBACROMIAL DECOMPRESSION;  Surgeon: Garald Balding, MD;  Location: [facility];  Service: Orthopedics;  Laterality: Right;  . TONSILLECTOMY  at age 7  . 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 TOTAL KNEE ARTHROPLASTY Right 03/18/2016   Procedure: TOTAL KNEE ARTHROPLASTY;  Surgeon: Garald Balding, MD;  Location: [facility];  Service: Orthopedics;  Laterality: Right;  Marland Kitchen VAGINAL HYSTERECTOMY  at age 67   Social History   Occupational History  . Not on file  Tobacco Use  . Smoking status: Never Smoker  . Smokeless tobacco: Never Used  Substance and Sexual Activity  . Alcohol use: No    Alcohol/week: 0.0 standard drinks  . Drug use: No  . Sexual activity: Yes    Birth control/protection: Post-menopausal, Surgical

## 2020-08-17 ENCOUNTER — Other Ambulatory Visit: Payer: Self-pay | Admitting: Family Medicine

## 2020-08-17 DIAGNOSIS — I10 Essential (primary) hypertension: Secondary | ICD-10-CM

## 2020-09-03 ENCOUNTER — Encounter: Payer: Self-pay | Admitting: Orthopaedic Surgery

## 2020-09-03 ENCOUNTER — Ambulatory Visit (INDEPENDENT_AMBULATORY_CARE_PROVIDER_SITE_OTHER): Payer: Medicare Other | Admitting: Orthopaedic Surgery

## 2020-09-03 VITALS — Ht 60.25 in | Wt 172.0 lb

## 2020-09-03 DIAGNOSIS — M1612 Unilateral primary osteoarthritis, left hip: Secondary | ICD-10-CM | POA: Diagnosis not present

## 2020-09-03 NOTE — Progress Notes (Signed)
Office Visit Note   Patient: Katherine Stanley           Date of Birth: 1945-08-11           MRN: [id number] Visit Date: 09/03/2020              Requested by: Garald Balding, MD [address] [place],  [place] [address] PCP: [name], MD   Assessment & Plan: Visit Diagnoses:  1. Unilateral primary osteoarthritis, left hip     Plan: She does have severe end-stage arthritis of her left hip and we are recommending hip replacement surgery.  We had a long and thorough discussion about the interoperative and postoperative course.  I went over her x-rays with her and described what the surgery involves.  We talked about the risks and benefits of this type of surgery.  I did give her handout about hip replacement surgery as well.  She has our surgery scheduler's card so she is trying to decide if she should do this now versus waiting in the fall.  She does play a lot of golf and does participate in golf tournaments.  She will let us know.  All questions and concerns were answered and addressed.  Follow-Up Instructions: Return for 2 weeks post-op.   Orders:  No orders of the defined types were placed in this encounter.  No orders of the defined types were placed in this encounter.     Procedures: No procedures performed   Clinical Data: No additional findings.   Subjective: Chief Complaint  Patient presents with  . Left Hip - Pain  Patient is a very pleasant and active 75 year old female who is an avid golfer.  She has known and well-documented end-stage arthritis of her left hip.  She comes to see me as a referral from Dr. Durward Fortes to consider hip replacement surgery.  She actually has a history of a right knee replacement.  She has had several steroid injections in that left hip joint under image guidance.  She is worked on activity modification and taking anti-inflammatories.  She is not a diabetic.  She is try to strengthen her hip as well.  She has tried and failed  conservative treatment for over a year now.  At this point her left hip pain is daily and is detriment affecting her mobility, her quality of life and her actives daily living.  She is interested in hip replacement surgery.  HPI  Review of Systems She currently denies any headache, chest pain, shortness of breath, fever, chills, nausea, vomiting  Objective: Vital Signs: Ht 5' 0.25" (1.53 m)   Wt 172 lb (78 kg)   BMI 33.31 kg/m   Physical Exam She is alert and orient x3 and in no acute distress Ortho Exam Examination of her left hip shows significant pain in the groin with internal and external rotation as well as severe stiffness with motion of the left hip.  Her right hip moves smoothly and normally. Specialty Comments:  No specialty comments available.  Imaging: No results found. An AP pelvis and lateral of the left hip are independently reviewed and show complete loss of joint space on the left side.  The right hip is a good comparison ankles that appears normal.  The left hip has essentially no joint space remaining with sclerotic changes and particular osteophytes.  PMFS History: Patient Active Problem List   Diagnosis Date Noted  . Unilateral primary osteoarthritis, left hip 08/08/2020  . Unilateral primary  osteoarthritis, left knee 09/13/2019  . Discomfort of back 04/20/2019  . Prediabetes 03/19/2018  . Unilateral primary osteoarthritis, right knee 03/18/2016  . S/P total knee replacement using cement, right 03/18/2016  . 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 Osteopenia 08/23/2012  . 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 Hypertension 10/27/2011  . Rosacea    Past Medical History:  Diagnosis Date  . 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   . Arthritis   . Diverticulosis   . Headache    occasionally  . Hypertension    takes Lisinopril daily  . Joint pain   . Osteopenia   . Rosacea     Family History  Problem Relation Age of  Onset  . Hypertension Mother   . Heart disease Mother   . Kidney disease Mother   . Diabetes Father   . Hypertension Father   . Heart disease Father   . Heart disease Brother   . Cancer Maternal Aunt   . Ovarian cancer Maternal Aunt 85  . Stroke Brother   . Alzheimer's disease Brother   . Diabetes Paternal Grandmother   . Breast cancer Neg Hx     Past Surgical History:  Procedure Laterality Date  . CHOLECYSTECTOMY    . CHOLECYSTECTOMY    . COLONOSCOPY    . COLONOSCOPY WITH PROPOFOL N/A 11/25/2019   Procedure: COLONOSCOPY WITH PROPOFOL;  Surgeon: Virgel Manifold, MD;  Location: [facility];  Service: Gastroenterology;  Laterality: N/A;  . EYE SURGERY  2013   LASIK  . REFRACTIVE SURGERY    . SHOULDER ARTHROSCOPY WITH OPEN ROTATOR CUFF REPAIR AND DISTAL CLAVICLE ACROMINECTOMY Right 09/19/2014   Procedure: SHOULDER ARTHROSCOPY WITH MINI-OPEN ROTATOR CUFF REPAIR AND DISTAL CLAVICLE RESECTION, SUBACROMIAL DECOMPRESSION;  Surgeon: Garald Balding, MD;  Location: [facility];  Service: Orthopedics;  Laterality: Right;  . TONSILLECTOMY  at age 12  . 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 TOTAL KNEE ARTHROPLASTY Right 03/18/2016   Procedure: TOTAL KNEE ARTHROPLASTY;  Surgeon: Garald Balding, MD;  Location: [facility];  Service: Orthopedics;  Laterality: Right;  Marland Kitchen VAGINAL HYSTERECTOMY  at age 35   Social History   Occupational History  . Not on file  Tobacco Use  . Smoking status: Never Smoker  . Smokeless tobacco: Never Used  Substance and Sexual Activity  . Alcohol use: No    Alcohol/week: 0.0 standard drinks  . Drug use: No  . Sexual activity: Yes    Birth control/protection: Post-menopausal, Surgical

## 2020-09-11 ENCOUNTER — Ambulatory Visit: Payer: Medicare Other

## 2020-09-14 ENCOUNTER — Ambulatory Visit: Payer: Medicare Other

## 2020-09-17 ENCOUNTER — Ambulatory Visit: Payer: Medicare Other

## 2020-09-19 ENCOUNTER — Ambulatory Visit (INDEPENDENT_AMBULATORY_CARE_PROVIDER_SITE_OTHER): Payer: Medicare Other

## 2020-09-19 ENCOUNTER — Other Ambulatory Visit: Payer: Self-pay

## 2020-09-19 ENCOUNTER — Encounter: Payer: Self-pay | Admitting: Family Medicine

## 2020-09-19 VITALS — BP 147/88 | Temp 98.3°F | Resp 14 | Ht 60.25 in | Wt 169.8 lb

## 2020-09-19 DIAGNOSIS — Z1231 Encounter for screening mammogram for malignant neoplasm of breast: Secondary | ICD-10-CM | POA: Diagnosis not present

## 2020-09-19 DIAGNOSIS — Z0000 Encounter for general adult medical examination without abnormal findings: Secondary | ICD-10-CM | POA: Diagnosis not present

## 2020-09-19 NOTE — Progress Notes (Signed)
Subjective:   Katherine Stanley is a 75 y.o. female who presents for Medicare Annual (Subsequent) preventive examination.  Review of Systems    No ROS.  Medicare Wellness    Cardiac Risk Factors include: advanced age (>20men, >82 women);hypertension     Objective:    Today's Vitals   09/19/20 0941  BP: (!) 147/88  Resp: 14  Temp: 98.3 F (36.8 C)  TempSrc: Oral  Weight: 169 lb 12.8 oz (77 kg)  Height: 5' 0.25" (1.53 m)   Body mass index is 32.89 kg/m.  Advanced Directives 09/19/2020 11/25/2019 09/09/2019 09/03/2018 08/31/2017 08/28/2016 04/02/2016  Does Patient Have a Medical Advance Directive? Yes Yes Yes Yes Yes Yes Yes  Type of Paramedic of [place];Living will [place];Living will [place];Living will [place];Living will [place];Living will [place];Living will -  Does patient want to make changes to medical advance directive? No - Patient declined - - No - Patient declined No - Patient declined No - Patient declined -  Copy of [place] in Chart? No - copy requested No - copy requested No - copy requested No - copy requested No - copy requested No - copy requested -    Current Medications (verified) Outpatient Encounter Medications as of 09/19/2020  Medication Sig  . acetaminophen (TYLENOL) 500 MG tablet Take 1,000 mg by mouth every 6 (six) hours as needed (for pain.).  Marland Kitchen cholecalciferol (VITAMIN D) 1000 units tablet Take 1,000 Units by mouth daily.  Marland Kitchen lisinopril (ZESTRIL) 10 MG tablet Take 1/2 (one-half) tablet by mouth once daily  . Lysine 1000 MG TABS Take 1,000 mg by mouth daily as needed (for fever blisters).  . Multiple Vitamins-Minerals (EYE VITAMINS PO) Take by mouth.  . Nutritional Supplements (NUTRITIONAL SUPPLEMENT PO) Take 1 tablet by mouth daily.  . prednisoLONE acetate (PRED FORTE) 1 % ophthalmic suspension   .  Probiotic Product (PROBIOTIC DAILY PO) Take 1 capsule by mouth daily.   Marland Kitchen Propylene Glycol-Glycerin 0.6-0.6 % SOLN Place 1 drop into both eyes daily.  . vitamin B-12 (CYANOCOBALAMIN) 1000 MCG tablet Take 1,000 mcg by mouth daily.   No facility-administered encounter medications on file as of 09/19/2020.    Allergies (verified) Alendronate and Aleve [naproxen sodium]   History: Past Medical History:  Diagnosis Date  . 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   . Arthritis   . Diverticulosis   . Headache    occasionally  . Hypertension    takes Lisinopril daily  . Joint pain   . Osteopenia   . Rosacea    Past Surgical History:  Procedure Laterality Date  . CHOLECYSTECTOMY    . CHOLECYSTECTOMY    . COLONOSCOPY    . COLONOSCOPY WITH PROPOFOL N/A 11/25/2019   Procedure: COLONOSCOPY WITH PROPOFOL;  Surgeon: Virgel Manifold, MD;  Location: [facility];  Service: Gastroenterology;  Laterality: N/A;  . EYE SURGERY  2013   LASIK  . REFRACTIVE SURGERY    . SHOULDER ARTHROSCOPY WITH OPEN ROTATOR CUFF REPAIR AND DISTAL CLAVICLE ACROMINECTOMY Right 09/19/2014   Procedure: SHOULDER ARTHROSCOPY WITH MINI-OPEN ROTATOR CUFF REPAIR AND DISTAL CLAVICLE RESECTION, SUBACROMIAL DECOMPRESSION;  Surgeon: Garald Balding, MD;  Location: [facility];  Service: Orthopedics;  Laterality: Right;  . TONSILLECTOMY  at age 88  . 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 TOTAL KNEE ARTHROPLASTY Right 03/18/2016   Procedure: TOTAL KNEE ARTHROPLASTY;  Surgeon: Garald Balding, MD;  Location: [facility];  Service: Orthopedics;  Laterality: Right;  Marland Kitchen VAGINAL HYSTERECTOMY  at age 33   Family History  Problem Relation Age of Onset  . Hypertension Mother   . Heart disease Mother   . Kidney disease Mother   . Diabetes Father   . Hypertension Father   . Heart disease Father   . Heart disease Brother   . Cancer Maternal Aunt   . Ovarian cancer Maternal Aunt 85  . Stroke Brother   .  Alzheimer's disease Brother   . Diabetes Paternal Grandmother   . Breast cancer Neg Hx    Social History   Socioeconomic History  . Marital status: Married    Spouse name: Not on file  . Number of children: Not on file  . Years of education: Not on file  . Highest education level: Not on file  Occupational History  . Not on file  Tobacco Use  . Smoking status: Never Smoker  . Smokeless tobacco: Never Used  Substance and Sexual Activity  . Alcohol use: No    Alcohol/week: 0.0 standard drinks  . Drug use: No  . Sexual activity: Yes    Birth control/protection: Post-menopausal, Surgical  Other Topics Concern  . Not on file  Social History Narrative   Lives in [place] with husband. No children. No pets.      Work - retired Sales promotion account executive   Diet - regular   Exercise - golf, gardening   Social Determinants of Radio broadcast assistant Strain: [place]   . Difficulty of Paying Living Expenses: Not hard at all  Food Insecurity: No Food Insecurity  . Worried About Charity fundraiser in the Last Year: Never true  . Ran Out of Food in the Last Year: Never true  Transportation Needs: No Transportation Needs  . Lack of Transportation (Medical): No  . Lack of Transportation (Non-Medical): No  Physical Activity: Sufficiently Active  . Days of Exercise per Week: 7 days  . Minutes of Exercise per Session: 30 min  Stress: No Stress Concern Present  . Feeling of Stress : Not at all  Social Connections: Not on file    Tobacco Counseling Counseling given: Not Answered   Clinical Intake:  Pre-visit preparation completed: Yes        Diabetes: No  How often do you need to have someone help you when you read instructions, pamphlets, or other written materials from your doctor or pharmacy?: 1 - Never  Interpreter Needed?: No      Activities of Daily Living In your present state of health, do you have any difficulty performing the following activities: 09/19/2020   Hearing? N  Vision? N  Difficulty concentrating or making decisions? N  Walking or climbing stairs? N  Dressing or bathing? N  Doing errands, shopping? N  Preparing Food and eating ? N  Using the Toilet? N  In the past six months, have you accidently leaked urine? N  Do you have problems with loss of bowel control? N  Managing your Medications? N  Managing your Finances? N  Housekeeping or managing your Housekeeping? N  Some recent data might be hidden    Patient Care Team: [name], MD as PCP - General (Family Medicine)  Indicate any recent Medical Services you may have received from other than [facility] providers in the past year (date may be approximate).     Assessment:   This is a  routine wellness examination for Lesleigh.  Hearing/Vision screen  Hearing Screening   125Hz  250Hz  500Hz  1000Hz  2000Hz  3000Hz  4000Hz  6000Hz  8000Hz   Right ear:           Left ear:           Comments: Patient is able to hear conversational tones without difficulty.  No issues reported.  Vision Screening Comments: Wears corrective lenses  Extreme dry eye; ointment in use  They have seen their ophthalmologist in the last 12 months.  Dietary issues and exercise activities discussed: Current Exercise Habits: Home exercise routine, Time (Minutes): 30, Frequency (Times/Week): 7, Weekly Exercise (Minutes/Week): 210, Intensity: Mild  Low carb diet Good water intake  Goals Addressed              This Visit's Progress     Patient Stated   .  COMPLETED: DIET - INCREASE LEAN PROTEINS (pt-stated)        Low carb diet      Other   .  Weight goal 135lb        Stay hydrated  Low carb diet Stay active      Depression Screen PHQ 2/9 Scores 09/19/2020 09/09/2019 04/20/2019 09/03/2018 08/31/2017 08/28/2016 10/24/2015  PHQ - 2 Score 0 0 0 0 0 0 0  PHQ- 9 Score - - - - - 0 -    Fall Risk Fall Risk  09/19/2020 09/09/2019 04/20/2019 09/03/2018 08/31/2017  Falls in the past year? 0 0 0 0 Yes  Number  falls in past yr: 0 0 0 - 1  Injury with Fall? 0 - - - No  Comment - - - - Steppped on a rock and rolled her ankle over. She did not seek medical attention. Supported with sports tape.    Follow up Falls evaluation completed - Falls evaluation completed - Education provided;Falls prevention discussed    FALL RISK PREVENTION PERTAINING TO THE HOME: Handrails in use when climbing stairs?Yes Home free of loose throw rugs in walkways, pet beds, electrical cords, etc? Yes  Adequate lighting in your home to reduce risk of falls? Yes   ASSISTIVE DEVICES UTILIZED TO PREVENT FALLS: Life alert? No  Use of a cane, walker or w/c? No  Grab bars in the bathroom? No  Shower chair or bench in shower? No  Elevated toilet seat or a handicapped toilet? No   TIMED UP AND GO: Was the test performed? Yes .  Length of time to ambulate 10 feet: 10 sec.   Gait steady and fast without use of assistive device  Cognitive Function: Patient is alert and oriented x3.  MMSE - Mini Mental State Exam 08/31/2017 08/28/2016 08/29/2015  Orientation to time 5 5 5   Orientation to Place 5 5 5   Registration 3 3 3   Attention/ Calculation 5 5 5   Recall 3 2 3   Language- name 2 objects 2 2 2   Language- repeat 1 1 1   Language- follow 3 step command 3 3 3   Language- read & follow direction 1 1 1   Write a sentence 1 1 1   Copy design 1 1 1   Total score 30 29 30      6CIT Screen 09/03/2018  What Year? 0 points  What month? 0 points  What time? 0 points  Count back from 20 0 points  Months in reverse 0 points  Repeat phrase 0 points  Total Score 0    Immunizations Immunization History  Administered Date(s) Administered  . Fluad Quad(high Dose 65+) 01/03/2019, 01/27/2020  .  Influenza, High Dose Seasonal PF 01/16/2017, 02/19/2018  . Influenza,inj,Quad PF,6+ Mos 02/24/2013, 01/23/2014, 02/28/2015, 01/28/2016  . PFIZER(Purple Top)SARS-COV-2 Vaccination 05/24/2019, 06/14/2019, 02/02/2020  . Pneumococcal Conjugate-13  07/20/2013  . Pneumococcal Polysaccharide-23 07/24/2014  . Td 07/16/2010   TDAP status: Due, Education has been provided regarding the importance of this vaccine. Advised may receive this vaccine at local pharmacy or Health Dept. Aware to provide a copy of the vaccination record if obtained from local pharmacy or Health Dept. Verbalized acceptance and understanding. Deferred.   Shingles vaccine- discontinued per patient.   Covid booster- second booster not yet completed.   Health Maintenance Health Maintenance  Topic Date Due  . Pneumococcal Vaccine 60-68 Years old (1 of 4 - PCV13) Never done  . COVID-19 Vaccine (4 - Booster for Pfizer series) 10/05/2020 (Originally 05/04/2020)  . TETANUS/TDAP  10/19/2020 (Originally 07/15/2020)  . MAMMOGRAM  10/09/2020  . INFLUENZA VACCINE  11/12/2020  . DEXA SCAN  01/23/2021  . COLONOSCOPY (Pts 45-71yrs Insurance coverage will need to be confirmed)  11/24/2024  . Hepatitis C Screening  Completed  . PNA vac Low Risk Adult  Completed  . HPV VACCINES  Aged Out  . Zoster Vaccines- Shingrix  Discontinued   Colorectal cancer screening: Type of screening: Colonoscopy. Completed 11/25/19. Repeat every 5 years  Mammogram status: Completed 09/20/19. Repeat every year. Ordered. Patient aware to call and schedule after receiving notification from their office.   Bone Density status: Completed 01/24/19. Results reflect: Bone density results: OSTEOPENIA. Repeat every 2 years. Order deferred.   Lung Cancer Screening: (Low Dose CT Chest recommended if Age 81-80 years, 30 pack-year currently smoking OR have quit w/in 15years.) does not qualify.   Vision Screening: Recommended annual ophthalmology exams for early detection of glaucoma and other disorders of the eye. Is the patient up to date with their annual eye exam?  Yes  Who is the provider or what is the name of the office in which the patient attends annual eye exams? [facility] Screening:  Recommended annual dental exams for proper oral hygiene.  Community Resource Referral / Chronic Care Management: CRR required this visit?  No   CCM required this visit?  No   BP reading- patient notes lower numbers at home.    Plan:    I have personally reviewed and noted the following in the patient's chart:   . Medical and social history . Use of alcohol, tobacco or illicit drugs  . Current medications and supplements including opioid prescriptions.  . Functional ability and status . Nutritional status . Physical activity . Advanced directives . List of other physicians . Hospitalizations, surgeries, and ER visits in previous 12 months . Vitals . Screenings to include cognitive, depression, and falls . Referrals and appointments  In addition, I have reviewed and discussed with patient certain preventive protocols, quality metrics, and best practice recommendations. A written personalized care plan for preventive services as well as general preventive health recommendations were provided to patient.     Varney Biles, LPN   [id number]

## 2020-09-19 NOTE — Patient Instructions (Addendum)
Ms. Katherine Stanley , Thank you for taking time to come for your Medicare Wellness Visit. I appreciate your ongoing commitment to your health goals. Please review the following plan we discussed and let me know if I can assist you in the future.   These are the goals we discussed: Goals    . Weight goal 135lb     Stay hydrated  Low carb diet Stay active       This is a list of the screening recommended for you and due dates:  Health Maintenance  Topic Date Due  . Pneumococcal Vaccination (1 of 4 - PCV13) Never done  . COVID-19 Vaccine (4 - Booster for Pfizer series) 10/05/2020*  . Tetanus Vaccine  10/19/2020*  . Mammogram  10/09/2020  . Flu Shot  11/12/2020  . DEXA scan (bone density measurement)  01/23/2021  . Colon Cancer Screening  11/24/2024  . Hepatitis C Screening: USPSTF Recommendation to screen - Ages 40-79 yo.  Completed  . Pneumonia vaccines  Completed  . HPV Vaccine  Aged Out  . Zoster (Shingles) Vaccine  Discontinued  *Topic was postponed. The date shown is not the original due date.   Advanced directives: End of life planning; Advance aging; Advanced directives discussed.  Copy of current HCPOA/Living Will requested.    Conditions/risks identified: none new  Next appointment: Follow up in one year for your annual wellness visit    Preventive Care 65 Years and Older, Female Preventive care refers to lifestyle choices and visits with your health care provider that can promote health and wellness. What does preventive care include?  A yearly physical exam. This is also called an annual well check.  Dental exams once or twice a year.  Routine eye exams. Ask your health care provider how often you should have your eyes checked.  Personal lifestyle choices, including:  Daily care of your teeth and gums.  Regular physical activity.  Eating a healthy diet.  Avoiding tobacco and drug use.  Limiting alcohol use.  Practicing safe sex.  Taking low-dose aspirin  every day.  Taking vitamin and mineral supplements as recommended by your health care provider. What happens during an annual well check? The services and screenings done by your health care provider during your annual well check will depend on your age, overall health, lifestyle risk factors, and family history of disease. Counseling  Your health care provider may ask you questions about your:  Alcohol use.  Tobacco use.  Drug use.  Emotional well-being.  Home and relationship well-being.  Sexual activity.  Eating habits.  History of falls.  Memory and ability to understand (cognition).  Work and work Statistician.  Reproductive health. Screening  You may have the following tests or measurements:  Height, weight, and BMI.  Blood pressure.  Lipid and cholesterol levels. These may be checked every 5 years, or more frequently if you are over 40 years old.  Skin check.  Lung cancer screening. You may have this screening every year starting at age 40 if you have a 30-pack-year history of smoking and currently smoke or have quit within the past 15 years.  Fecal occult blood test (FOBT) of the stool. You may have this test every year starting at age 19.  Flexible sigmoidoscopy or colonoscopy. You may have a sigmoidoscopy every 5 years or a colonoscopy every 10 years starting at age 54.  Hepatitis C blood test.  Hepatitis B blood test.  Sexually transmitted disease (STD) testing.  Diabetes screening. This is  done by checking your blood sugar (glucose) after you have not eaten for a while (fasting). You may have this done every 1-3 years.  Bone density scan. This is done to screen for osteoporosis. You may have this done starting at age 77.  Mammogram. This may be done every 1-2 years. Talk to your health care provider about how often you should have regular mammograms. Talk with your health care provider about your test results, treatment options, and if necessary,  the need for more tests. Vaccines  Your health care provider may recommend certain vaccines, such as:  Influenza vaccine. This is recommended every year.  Tetanus, diphtheria, and acellular pertussis (Tdap, Td) vaccine. You may need a Td booster every 10 years.  Zoster vaccine. You may need this after age 38.  Pneumococcal 13-valent conjugate (PCV13) vaccine. One dose is recommended after age 24.  Pneumococcal polysaccharide (PPSV23) vaccine. One dose is recommended after age 50. Talk to your health care provider about which screenings and vaccines you need and how often you need them. This information is not intended to replace advice given to you by your health care provider. Make sure you discuss any questions you have with your health care provider. Document Released: 04/27/2015 Document Revised: 12/19/2015 Document Reviewed: 01/30/2015 Elsevier Interactive Patient Education  2017 [place] Prevention in the Home Falls can cause injuries. They can happen to people of all ages. There are many things you can do to make your home safe and to help prevent falls. What can I do on the outside of my home?  Regularly fix the edges of walkways and driveways and fix any cracks.  Remove anything that might make you trip as you walk through a door, such as a raised step or threshold.  Trim any bushes or trees on the path to your home.  Use bright outdoor lighting.  Clear any walking paths of anything that might make someone trip, such as rocks or tools.  Regularly check to see if handrails are loose or broken. Make sure that both sides of any steps have handrails.  Any raised decks and porches should have guardrails on the edges.  Have any leaves, snow, or ice cleared regularly.  Use sand or salt on walking paths during winter.  Clean up any spills in your garage right away. This includes oil or grease spills. What can I do in the bathroom?  Use night lights.  Install  grab bars by the toilet and in the tub and shower. Do not use towel bars as grab bars.  Use non-skid mats or decals in the tub or shower.  If you need to sit down in the shower, use a plastic, non-slip stool.  Keep the floor dry. Clean up any water that spills on the floor as soon as it happens.  Remove soap buildup in the tub or shower regularly.  Attach bath mats securely with double-sided non-slip rug tape.  Do not have throw rugs and other things on the floor that can make you trip. What can I do in the bedroom?  Use night lights.  Make sure that you have a light by your bed that is easy to reach.  Do not use any sheets or blankets that are too big for your bed. They should not hang down onto the floor.  Have a firm chair that has side arms. You can use this for support while you get dressed.  Do not have throw rugs and other things  on the floor that can make you trip. What can I do in the kitchen?  Clean up any spills right away.  Avoid walking on wet floors.  Keep items that you use a lot in easy-to-reach places.  If you need to reach something above you, use a strong step stool that has a grab bar.  Keep electrical cords out of the way.  Do not use floor polish or wax that makes floors slippery. If you must use wax, use non-skid floor wax.  Do not have throw rugs and other things on the floor that can make you trip. What can I do with my stairs?  Do not leave any items on the stairs.  Make sure that there are handrails on both sides of the stairs and use them. Fix handrails that are broken or loose. Make sure that handrails are as long as the stairways.  Check any carpeting to make sure that it is firmly attached to the stairs. Fix any carpet that is loose or worn.  Avoid having throw rugs at the top or bottom of the stairs. If you do have throw rugs, attach them to the floor with carpet tape.  Make sure that you have a light switch at the top of the stairs and  the bottom of the stairs. If you do not have them, ask someone to add them for you. What else can I do to help prevent falls?  Wear shoes that:  Do not have high heels.  Have rubber bottoms.  Are comfortable and fit you well.  Are closed at the toe. Do not wear sandals.  If you use a stepladder:  Make sure that it is fully opened. Do not climb a closed stepladder.  Make sure that both sides of the stepladder are locked into place.  Ask someone to hold it for you, if possible.  Clearly mark and make sure that you can see:  Any grab bars or handrails.  First and last steps.  Where the edge of each step is.  Use tools that help you move around (mobility aids) if they are needed. These include:  Canes.  Walkers.  Scooters.  Crutches.  Turn on the lights when you go into a dark area. Replace any light bulbs as soon as they burn out.  Set up your furniture so you have a clear path. Avoid moving your furniture around.  If any of your floors are uneven, fix them.  If there are any pets around you, be aware of where they are.  Review your medicines with your doctor. Some medicines can make you feel dizzy. This can increase your chance of falling. Ask your doctor what other things that you can do to help prevent falls. This information is not intended to replace advice given to you by your health care provider. Make sure you discuss any questions you have with your health care provider. Document Released: 01/25/2009 Document Revised: 09/06/2015 Document Reviewed: 05/05/2014 Elsevier Interactive Patient Education  2017 [employer].

## 2020-10-21 DIAGNOSIS — Z20822 Contact with and (suspected) exposure to covid-19: Secondary | ICD-10-CM | POA: Diagnosis not present

## 2020-11-05 ENCOUNTER — Other Ambulatory Visit: Payer: Self-pay | Admitting: Family Medicine

## 2020-11-05 DIAGNOSIS — I10 Essential (primary) hypertension: Secondary | ICD-10-CM

## 2020-11-12 NOTE — Pre-Procedure Instructions (Addendum)
Surgical Instructions    Your procedure is scheduled on Tuesday 11/20/20.   Report to [facility] Main Entrance "A" at 10:00 A.M., then check in with the Admitting office.  Call this number if you have problems the morning of surgery:  [phone]  Your COVID test is on Friday 11/16/20 anytime between 8am-1pm at [facility]    If you have any questions prior to your surgery date call [phone]: Open Monday-Friday 8am-4pm    Remember:  Do not eat after midnight the night before your surgery  You may drink clear liquids until 09:00 A.M. the morning of your surgery.   Clear liquids allowed are: Water, Non-Citrus Juices (without pulp), Carbonated Beverages, Clear Tea, Black Coffee Only, and Gatorade    Take these medicines the morning of surgery with A SIP OF WATER   Carboxymethylcellulose Sodium (DRY EYE RELIEF OP)  acetaminophen (TYLENOL)- If needed   As of today, STOP taking any Aspirin (unless otherwise instructed by your surgeon) Aleve, Naproxen, Ibuprofen, Motrin, Advil, Goody's, BC's, all herbal medications, fish oil, and all vitamins.                     Do NOT Smoke (Tobacco/Vaping) or drink Alcohol 24 hours prior to your procedure.  If you use a CPAP at night, you may bring all equipment for your overnight stay.   Contacts, glasses, piercing's, hearing aid's, dentures or partials may not be worn into surgery, please bring cases for these belongings.    For patients admitted to the hospital, discharge time will be determined by your treatment team.   Patients discharged the day of surgery will not be allowed to drive home, and someone needs to stay with them for 24 hours.  ONLY 1 SUPPORT PERSON MAY BE PRESENT WHILE YOU ARE IN SURGERY. IF YOU ARE TO BE ADMITTED ONCE YOU ARE IN YOUR ROOM YOU WILL BE ALLOWED TWO (2) VISITORS.  Minor children may have two parents present. Special consideration for safety and communication needs will be reviewed on a case by case  basis.   Special instructions:   [place]- Preparing For Surgery  Before surgery, you can play an important role. Because skin is not sterile, your skin needs to be as free of germs as possible. You can reduce the number of germs on your skin by washing with CHG (chlorahexidine gluconate) Soap before surgery.  CHG is an antiseptic cleaner which kills germs and bonds with the skin to continue killing germs even after washing.    Oral Hygiene is also important to reduce your risk of infection.  Remember - BRUSH YOUR TEETH THE MORNING OF SURGERY WITH YOUR REGULAR TOOTHPASTE  Please do not use if you have an allergy to CHG or antibacterial soaps. If your skin becomes reddened/irritated stop using the CHG.  Do not shave (including legs and underarms) for at least 48 hours prior to first CHG shower. It is OK to shave your face.  Please follow these instructions carefully.   Shower the NIGHT BEFORE SURGERY and the MORNING OF SURGERY  If you chose to wash your hair, wash your hair first as usual with your normal shampoo.  After you shampoo, rinse your hair and body thoroughly to remove the shampoo.  Use CHG Soap as you would any other liquid soap. You can apply CHG directly to the skin and wash gently with a scrungie or a clean washcloth.   Apply the CHG Soap to your body ONLY  FROM THE NECK DOWN.  Do not use on open wounds or open sores. Avoid contact with your eyes, ears, mouth and genitals (private parts). Wash Face and genitals (private parts)  with your normal soap.   Wash thoroughly, paying special attention to the area where your surgery will be performed.  Thoroughly rinse your body with warm water from the neck down.  DO NOT shower/wash with your normal soap after using and rinsing off the CHG Soap.  Pat yourself dry with a CLEAN TOWEL.  Wear CLEAN PAJAMAS to bed the night before surgery  Place CLEAN SHEETS on your bed the night before your surgery  DO NOT SLEEP WITH  PETS.   Day of Surgery: Shower with CHG soap. Do not wear jewelry, make up, nail polish, gel polish, artificial nails, or any other type of covering on natural nails including finger and toenails. If patients have artificial nails, gel coating, etc. that need to be removed by a nail salon please have this removed prior to surgery. Surgery may need to be canceled/delayed if the surgeon/ anesthesia feels like the patient is unable to be adequately monitored. Do not wear lotions, powders, perfumes/colognes, or deodorant. Do not shave 48 hours prior to surgery.  Men may shave face and neck. Do not bring valuables to the hospital. [facility] is not responsible for any belongings or valuables. Wear Clean/Comfortable clothing the morning of surgery Remember to brush your teeth WITH YOUR REGULAR TOOTHPASTE.   Please read over the following fact sheets that you were given.

## 2020-11-13 ENCOUNTER — Encounter (HOSPITAL_COMMUNITY)
Admission: RE | Admit: 2020-11-13 | Discharge: 2020-11-13 | Disposition: A | Discharge status: Home / Self Care | Payer: Medicare Other | Source: Ambulatory Visit | Attending: Orthopaedic Surgery | Admitting: Orthopaedic Surgery

## 2020-11-13 ENCOUNTER — Encounter (HOSPITAL_COMMUNITY): Payer: Self-pay

## 2020-11-13 ENCOUNTER — Other Ambulatory Visit: Payer: Self-pay

## 2020-11-13 DIAGNOSIS — Z01818 Encounter for other preprocedural examination: Secondary | ICD-10-CM | POA: Diagnosis not present

## 2020-11-13 HISTORY — DX: Other specified postprocedural states: Z98.890

## 2020-11-13 HISTORY — DX: Other specified postprocedural states: R11.2

## 2020-11-13 LAB — BASIC METABOLIC PANEL
Anion gap: 10 (ref 5–15)
BUN: 24 mg/dL — ABNORMAL HIGH (ref 8–23)
CO2: 21 mmol/L — ABNORMAL LOW (ref 22–32)
Calcium: 10 mg/dL (ref 8.9–10.3)
Chloride: 105 mmol/L (ref 98–111)
Creatinine, Ser: 0.6 mg/dL (ref 0.44–1.00)
GFR, Estimated: >60 mL/min (ref >60)
Glucose, Bld: 99 mg/dL (ref 70–99)
Potassium: 4.2 mmol/L (ref 3.5–5.1)
Sodium: 136 mmol/L (ref 135–145)

## 2020-11-13 LAB — CBC
HCT: 41.9 % (ref 36.0–46.0)
Hemoglobin: 14.1 g/dL (ref 12.0–15.0)
MCH: 31.5 pg (ref 26.0–34.0)
MCHC: 33.7 g/dL (ref 30.0–36.0)
MCV: 93.7 fL (ref 80.0–100.0)
Platelets: 248 10*3/uL (ref 150–400)
RBC: 4.47 MIL/uL (ref 3.87–5.11)
RDW: 11.9 % (ref 11.5–15.5)
WBC: 7.8 10*3/uL (ref 4.0–10.5)
nRBC: 0 % (ref 0.0–0.2)

## 2020-11-13 LAB — TYPE AND SCREEN
ABO/RH(D): O POS
Antibody Screen: NEGATIVE

## 2020-11-13 LAB — SURGICAL PCR SCREEN
MRSA, PCR: NEGATIVE
Staphylococcus aureus: NEGATIVE

## 2020-11-13 NOTE — Progress Notes (Signed)
PCP - Dr. Glennon Mac Cardiologist - denies  PPM/ICD - n/a Device Orders - n/a Rep Notified - n/a  Chest x-ray - n/a EKG - 11/13/20 Stress Test - denies ECHO - 05/19/16 Cardiac Cath - denies  Sleep Study - denies CPAP - n/a  Fasting Blood Sugar - n/a Checks Blood Sugar _ n/a _ times a day  Blood Thinner Instructions: n/a Aspirin Instructions: As of today stop taking any Aspirin unless otherwise instructed by your surgeon  ERAS Protcol - Yes PRE-SURGERY Ensure or G2- n/a  COVID TEST- Called [facility] per patient request since she lives in that area. Patient is going on Friday 11/16/20 anytime between 8 am and 1 pm at the [place] [facility]. Patient is aware of location.    Anesthesia review: No  Patient denies shortness of breath, fever, cough and chest pain at PAT appointment   All instructions explained to the patient, with a verbal understanding of the material. Patient agrees to go over the instructions while at home for a better understanding. Patient also instructed to self quarantine after being tested for COVID-19. The opportunity to ask questions was provided.

## 2020-11-15 ENCOUNTER — Other Ambulatory Visit: Payer: Self-pay | Admitting: Physician Assistant

## 2020-11-16 ENCOUNTER — Other Ambulatory Visit
Admission: RE | Admit: 2020-11-16 | Discharge: 2020-11-16 | Disposition: A | Discharge status: Home / Self Care | Payer: Medicare Other | Source: Ambulatory Visit | Attending: Orthopaedic Surgery | Admitting: Orthopaedic Surgery

## 2020-11-16 ENCOUNTER — Other Ambulatory Visit: Payer: Self-pay

## 2020-11-16 DIAGNOSIS — Z01812 Encounter for preprocedural laboratory examination: Secondary | ICD-10-CM | POA: Insufficient documentation

## 2020-11-16 DIAGNOSIS — Z20822 Contact with and (suspected) exposure to covid-19: Secondary | ICD-10-CM | POA: Insufficient documentation

## 2020-11-16 LAB — SARS CORONAVIRUS 2 (TAT 6-24 HRS): SARS Coronavirus 2: NEGATIVE

## 2020-11-19 ENCOUNTER — Telehealth: Payer: Self-pay | Admitting: *Deleted

## 2020-11-19 NOTE — Telephone Encounter (Signed)
Ortho bundle Pre-op call completed. 

## 2020-11-19 NOTE — Care Plan (Signed)
RNCM call to discuss patient's upcoming Left total hip replacement with Dr. Ninfa Linden. She is an Ortho bundle patient through [facility] and is agreeable to case management. She lives with her husband, who will be assisting after surgery. She has a 3in1/BSC, but will need a FWW after surgery. This will be ordered through [facility] to be given to her in hospital prior to discharge. Will follow to see how she does with PT after surgery to determine need for HHPT. Will make referral to [facility] ([facility]) if needed. Reviewed all post op care instructions. Will continue to follow for needs.

## 2020-11-20 ENCOUNTER — Other Ambulatory Visit: Payer: Self-pay

## 2020-11-20 ENCOUNTER — Ambulatory Visit (HOSPITAL_COMMUNITY): Payer: Medicare Other | Admitting: Anesthesiology

## 2020-11-20 ENCOUNTER — Observation Stay (HOSPITAL_COMMUNITY): Payer: Medicare Other

## 2020-11-20 ENCOUNTER — Ambulatory Visit (HOSPITAL_COMMUNITY): Payer: Medicare Other

## 2020-11-20 ENCOUNTER — Observation Stay (HOSPITAL_COMMUNITY)
Admission: RE | Admit: 2020-11-20 | Discharge: 2020-11-21 | Disposition: A | Discharge status: Home / Self Care | Payer: Medicare Other | Attending: Orthopaedic Surgery | Admitting: Orthopaedic Surgery

## 2020-11-20 ENCOUNTER — Encounter (HOSPITAL_COMMUNITY)
Admission: RE | Disposition: A | Discharge status: Home / Self Care | Payer: Self-pay | Source: Home / Self Care | Attending: Orthopaedic Surgery

## 2020-11-20 ENCOUNTER — Encounter (HOSPITAL_COMMUNITY): Payer: Self-pay | Admitting: Orthopaedic Surgery

## 2020-11-20 DIAGNOSIS — M1612 Unilateral primary osteoarthritis, left hip: Principal | ICD-10-CM | POA: Diagnosis present

## 2020-11-20 DIAGNOSIS — Z96651 Presence of right artificial knee joint: Secondary | ICD-10-CM | POA: Insufficient documentation

## 2020-11-20 DIAGNOSIS — Z471 Aftercare following joint replacement surgery: Secondary | ICD-10-CM | POA: Diagnosis not present

## 2020-11-20 DIAGNOSIS — Z419 Encounter for procedure for purposes other than remedying health state, unspecified: Secondary | ICD-10-CM

## 2020-11-20 DIAGNOSIS — R7303 Prediabetes: Secondary | ICD-10-CM | POA: Diagnosis not present

## 2020-11-20 DIAGNOSIS — Z96642 Presence of left artificial hip joint: Secondary | ICD-10-CM

## 2020-11-20 DIAGNOSIS — L821 Other seborrheic keratosis: Secondary | ICD-10-CM | POA: Diagnosis not present

## 2020-11-20 DIAGNOSIS — I10 Essential (primary) hypertension: Secondary | ICD-10-CM | POA: Insufficient documentation

## 2020-11-20 HISTORY — PX: TOTAL HIP ARTHROPLASTY: SHX124

## 2020-11-20 SURGERY — ARTHROPLASTY, HIP, TOTAL, ANTERIOR APPROACH
Anesthesia: Spinal | Site: Hip | Laterality: Left

## 2020-11-20 MED ORDER — VITAMIN B-12 1000 MCG PO TABS
1000.0000 ug | ORAL_TABLET | Freq: Every day | ORAL | Status: DC
  Administered 2020-11-21: 1000 ug via ORAL
  Filled 2020-11-20: qty 1

## 2020-11-20 MED ORDER — LISINOPRIL 5 MG PO TABS
5.0000 mg | ORAL_TABLET | Freq: Every day | ORAL | Status: DC
  Administered 2020-11-21: 5 mg via ORAL
  Filled 2020-11-20: qty 1

## 2020-11-20 MED ORDER — PHENYLEPHRINE HCL-NACL 20-0.9 MG/250ML-% IV SOLN
INTRAVENOUS | Status: DC | PRN
Start: 2020-11-20 — End: 2020-11-20
  Administered 2020-11-20: 50 ug/min via INTRAVENOUS

## 2020-11-20 MED ORDER — METOCLOPRAMIDE HCL 5 MG/ML IJ SOLN: 5.0000 mg | Freq: Three times a day (TID) | INTRAMUSCULAR | Status: DC | PRN

## 2020-11-20 MED ORDER — SODIUM CHLORIDE 0.9 % IV SOLN: 0.0000 ug/min | INTRAVENOUS | Status: DC

## 2020-11-20 MED ORDER — POTASSIUM 99 MG PO TABS: 99.0000 mg | ORAL_TABLET | Freq: Every day | ORAL | Status: DC

## 2020-11-20 MED ORDER — METOCLOPRAMIDE HCL 5 MG PO TABS: 5.0000 mg | ORAL_TABLET | Freq: Three times a day (TID) | ORAL | Status: DC | PRN

## 2020-11-20 MED ORDER — ALUM & MAG HYDROXIDE-SIMETH 200-200-20 MG/5ML PO SUSP: 30.0000 mL | ORAL | Status: DC | PRN

## 2020-11-20 MED ORDER — PROPOFOL 1000 MG/100ML IV EMUL
INTRAVENOUS | Status: AC
  Filled 2020-11-20: qty 100

## 2020-11-20 MED ORDER — 0.9 % SODIUM CHLORIDE (POUR BTL) OPTIME
TOPICAL | Status: DC | PRN
  Administered 2020-11-20: 1000 mL

## 2020-11-20 MED ORDER — ONDANSETRON HCL 4 MG/2ML IJ SOLN
INTRAMUSCULAR | Status: DC | PRN
  Administered 2020-11-20: 4 mg via INTRAVENOUS

## 2020-11-20 MED ORDER — OXYCODONE HCL 5 MG PO TABS
5.0000 mg | ORAL_TABLET | Freq: Once | ORAL | Status: AC | PRN
  Administered 2020-11-20: 5 mg via ORAL

## 2020-11-20 MED ORDER — ORAL CARE MOUTH RINSE: 15.0000 mL | Freq: Once | OROMUCOSAL | Status: AC

## 2020-11-20 MED ORDER — PROPOFOL 10 MG/ML IV BOLUS
INTRAVENOUS | Status: DC | PRN
  Administered 2020-11-20: 10 mg via INTRAVENOUS

## 2020-11-20 MED ORDER — CHLORHEXIDINE GLUCONATE 0.12 % MT SOLN
15.0000 mL | Freq: Once | OROMUCOSAL | Status: AC
  Administered 2020-11-20: 15 mL via OROMUCOSAL
  Filled 2020-11-20: qty 15

## 2020-11-20 MED ORDER — MIDAZOLAM HCL 2 MG/2ML IJ SOLN
INTRAMUSCULAR | Status: AC
  Filled 2020-11-20: qty 2

## 2020-11-20 MED ORDER — ONDANSETRON HCL 4 MG PO TABS: 4.0000 mg | ORAL_TABLET | Freq: Four times a day (QID) | ORAL | Status: DC | PRN

## 2020-11-20 MED ORDER — DIPHENHYDRAMINE HCL 12.5 MG/5ML PO ELIX: 12.5000 mg | ORAL_SOLUTION | ORAL | Status: DC | PRN

## 2020-11-20 MED ORDER — ASPIRIN 81 MG PO CHEW
81.0000 mg | CHEWABLE_TABLET | Freq: Two times a day (BID) | ORAL | Status: DC
  Administered 2020-11-20 – 2020-11-21 (×2): 81 mg via ORAL
  Filled 2020-11-20 (×2): qty 1

## 2020-11-20 MED ORDER — VITAMIN D 25 MCG (1000 UNIT) PO TABS
1000.0000 [IU] | ORAL_TABLET | Freq: Every day | ORAL | Status: DC
  Administered 2020-11-21: 1000 [IU] via ORAL
  Filled 2020-11-20: qty 1

## 2020-11-20 MED ORDER — MENTHOL 3 MG MT LOZG: 1.0000 | LOZENGE | OROMUCOSAL | Status: DC | PRN

## 2020-11-20 MED ORDER — METHOCARBAMOL 1000 MG/10ML IJ SOLN
500.0000 mg | Freq: Four times a day (QID) | INTRAVENOUS | Status: DC | PRN
  Filled 2020-11-20: qty 5

## 2020-11-20 MED ORDER — CEFAZOLIN SODIUM-DEXTROSE 2-4 GM/100ML-% IV SOLN
2.0000 g | INTRAVENOUS | Status: AC
  Administered 2020-11-20: 2 g via INTRAVENOUS
  Filled 2020-11-20: qty 100

## 2020-11-20 MED ORDER — MIDAZOLAM HCL 5 MG/5ML IJ SOLN
INTRAMUSCULAR | Status: DC | PRN
  Administered 2020-11-20: 2 mg via INTRAVENOUS

## 2020-11-20 MED ORDER — SODIUM CHLORIDE 0.9 % IR SOLN
Status: DC | PRN
  Administered 2020-11-20: 3000 mL

## 2020-11-20 MED ORDER — FENTANYL CITRATE (PF) 250 MCG/5ML IJ SOLN
INTRAMUSCULAR | Status: DC | PRN
  Administered 2020-11-20 (×2): 50 ug via INTRAVENOUS
  Administered 2020-11-20: 25 ug via INTRAVENOUS

## 2020-11-20 MED ORDER — FERROUS GLUCONATE 324 (38 FE) MG PO TABS
324.0000 mg | ORAL_TABLET | Freq: Every day | ORAL | Status: DC
  Administered 2020-11-21: 324 mg via ORAL
  Filled 2020-11-20: qty 1

## 2020-11-20 MED ORDER — VANCOMYCIN HCL 1000 MG IV SOLR
INTRAVENOUS | Status: AC
  Filled 2020-11-20: qty 1000

## 2020-11-20 MED ORDER — HYDROMORPHONE HCL 1 MG/ML IJ SOLN: 0.2500 mg | INTRAMUSCULAR | Status: DC | PRN

## 2020-11-20 MED ORDER — PANTOPRAZOLE SODIUM 40 MG PO TBEC
40.0000 mg | DELAYED_RELEASE_TABLET | Freq: Every day | ORAL | Status: DC
  Administered 2020-11-21: 40 mg via ORAL
  Filled 2020-11-20: qty 1

## 2020-11-20 MED ORDER — POVIDONE-IODINE 10 % EX SWAB
2.0000 "application " | Freq: Once | CUTANEOUS | Status: AC
  Administered 2020-11-20: 2 via TOPICAL

## 2020-11-20 MED ORDER — ONDANSETRON HCL 4 MG/2ML IJ SOLN: 4.0000 mg | Freq: Four times a day (QID) | INTRAMUSCULAR | Status: DC | PRN

## 2020-11-20 MED ORDER — PHENYLEPHRINE HCL (PRESSORS) 10 MG/ML IV SOLN
INTRAVENOUS | Status: DC | PRN
  Administered 2020-11-20: 80 ug via INTRAVENOUS

## 2020-11-20 MED ORDER — DOCUSATE SODIUM 100 MG PO CAPS
100.0000 mg | ORAL_CAPSULE | Freq: Two times a day (BID) | ORAL | Status: DC
  Administered 2020-11-20 – 2020-11-21 (×2): 100 mg via ORAL
  Filled 2020-11-20 (×2): qty 1

## 2020-11-20 MED ORDER — SODIUM CHLORIDE 0.9 % IV SOLN: INTRAVENOUS | Status: DC

## 2020-11-20 MED ORDER — METHOCARBAMOL 500 MG PO TABS
500.0000 mg | ORAL_TABLET | Freq: Four times a day (QID) | ORAL | Status: DC | PRN
  Administered 2020-11-20 – 2020-11-21 (×2): 500 mg via ORAL
  Filled 2020-11-20 (×2): qty 1

## 2020-11-20 MED ORDER — CEFAZOLIN SODIUM-DEXTROSE 1-4 GM/50ML-% IV SOLN
1.0000 g | Freq: Four times a day (QID) | INTRAVENOUS | Status: AC
Start: 2020-11-20 — End: 2020-11-20
  Administered 2020-11-20 (×2): 1 g via INTRAVENOUS
  Filled 2020-11-20 (×2): qty 50

## 2020-11-20 MED ORDER — PROMETHAZINE HCL 25 MG/ML IJ SOLN: 6.2500 mg | INTRAMUSCULAR | Status: DC | PRN

## 2020-11-20 MED ORDER — FENTANYL CITRATE (PF) 250 MCG/5ML IJ SOLN
INTRAMUSCULAR | Status: AC
  Filled 2020-11-20: qty 5

## 2020-11-20 MED ORDER — HYDROMORPHONE HCL 1 MG/ML IJ SOLN
0.5000 mg | INTRAMUSCULAR | Status: DC | PRN
  Administered 2020-11-20 – 2020-11-21 (×3): 1 mg via INTRAVENOUS
  Filled 2020-11-20 (×3): qty 1

## 2020-11-20 MED ORDER — COQ-10 100 MG PO CAPS: 100.0000 mg | ORAL_CAPSULE | Freq: Every day | ORAL | Status: DC

## 2020-11-20 MED ORDER — PROPOFOL 500 MG/50ML IV EMUL
INTRAVENOUS | Status: DC | PRN
  Administered 2020-11-20: 50 ug/kg/min via INTRAVENOUS

## 2020-11-20 MED ORDER — LACTATED RINGERS IV SOLN: INTRAVENOUS | Status: DC

## 2020-11-20 MED ORDER — TRANEXAMIC ACID-NACL 1000-0.7 MG/100ML-% IV SOLN
1000.0000 mg | INTRAVENOUS | Status: AC
  Administered 2020-11-20: 1000 mg via INTRAVENOUS
  Filled 2020-11-20: qty 100

## 2020-11-20 MED ORDER — OXYCODONE HCL 5 MG PO TABS
ORAL_TABLET | ORAL | Status: AC
  Filled 2020-11-20: qty 1

## 2020-11-20 MED ORDER — VANCOMYCIN HCL 1000 MG IV SOLR
INTRAVENOUS | Status: DC | PRN
  Administered 2020-11-20: 1000 mg via TOPICAL

## 2020-11-20 MED ORDER — BUPIVACAINE HCL (PF) 0.75 % IJ SOLN
INTRAMUSCULAR | Status: DC | PRN
  Administered 2020-11-20: 2 mL via INTRATHECAL

## 2020-11-20 MED ORDER — ACETAMINOPHEN 325 MG PO TABS: 325.0000 mg | ORAL_TABLET | Freq: Four times a day (QID) | ORAL | Status: DC | PRN

## 2020-11-20 MED ORDER — OXYCODONE HCL 5 MG PO TABS: 10.0000 mg | ORAL_TABLET | ORAL | Status: DC | PRN

## 2020-11-20 MED ORDER — OXYCODONE HCL 5 MG/5ML PO SOLN: 5.0000 mg | Freq: Once | ORAL | Status: AC | PRN

## 2020-11-20 MED ORDER — PHENOL 1.4 % MT LIQD: 1.0000 | OROMUCOSAL | Status: DC | PRN

## 2020-11-20 MED ORDER — OXYCODONE HCL 5 MG PO TABS
5.0000 mg | ORAL_TABLET | ORAL | Status: DC | PRN
  Administered 2020-11-20 – 2020-11-21 (×2): 10 mg via ORAL
  Filled 2020-11-20 (×2): qty 2

## 2020-11-20 SURGICAL SUPPLY — 56 items
APL SKNCLS STERI-STRIP NONHPOA (GAUZE/BANDAGES/DRESSINGS) ×1
BAG COUNTER SPONGE SURGICOUNT (BAG) ×2 IMPLANT
BAG SPNG CNTER NS LX DISP (BAG) ×1
BENZOIN TINCTURE PRP APPL 2/3 (GAUZE/BANDAGES/DRESSINGS) ×2 IMPLANT
BLADE CLIPPER SURG (BLADE) IMPLANT
BLADE SAW SGTL 18X1.27X75 (BLADE) ×2 IMPLANT
COVER SURGICAL LIGHT HANDLE (MISCELLANEOUS) ×2 IMPLANT
CUP SECTOR GRIPTON 50MM (Cup) ×1 IMPLANT
DRAPE C-ARM 42X72 X-RAY (DRAPES) ×2 IMPLANT
DRAPE STERI IOBAN 125X83 (DRAPES) ×2 IMPLANT
DRAPE U-SHAPE 47X51 STRL (DRAPES) ×6 IMPLANT
DRSG AQUACEL AG ADV 3.5X 6 (GAUZE/BANDAGES/DRESSINGS) ×1 IMPLANT
DRSG AQUACEL AG ADV 3.5X10 (GAUZE/BANDAGES/DRESSINGS) ×2 IMPLANT
DURAPREP 26ML APPLICATOR (WOUND CARE) ×2 IMPLANT
ELECT BLADE 4.0 EZ CLEAN MEGAD (MISCELLANEOUS) ×2
ELECT BLADE 6.5 EXT (BLADE) IMPLANT
ELECT REM PT RETURN 9FT ADLT (ELECTROSURGICAL) ×2
ELECTRODE BLDE 4.0 EZ CLN MEGD (MISCELLANEOUS) ×1 IMPLANT
ELECTRODE REM PT RTRN 9FT ADLT (ELECTROSURGICAL) ×1 IMPLANT
FACESHIELD WRAPAROUND (MASK) ×4 IMPLANT
FACESHIELD WRAPAROUND OR TEAM (MASK) ×2 IMPLANT
GLOVE SRG 8 PF TXTR STRL LF DI (GLOVE) ×2 IMPLANT
GLOVE SURG LTX SZ8 (GLOVE) ×2 IMPLANT
GLOVE SURG ORTHO LTX SZ7.5 (GLOVE) ×4 IMPLANT
GLOVE SURG UNDER POLY LF SZ8 (GLOVE) ×4
GOWN STRL REUS W/ TWL LRG LVL3 (GOWN DISPOSABLE) ×2 IMPLANT
GOWN STRL REUS W/ TWL XL LVL3 (GOWN DISPOSABLE) ×2 IMPLANT
GOWN STRL REUS W/TWL LRG LVL3 (GOWN DISPOSABLE) ×4
GOWN STRL REUS W/TWL XL LVL3 (GOWN DISPOSABLE) ×4
HANDPIECE INTERPULSE COAX TIP (DISPOSABLE) ×2
HEAD FEM STD 32X+1 STRL (Hips) ×1 IMPLANT
KIT BASIN OR (CUSTOM PROCEDURE TRAY) ×2 IMPLANT
KIT TURNOVER KIT B (KITS) ×2 IMPLANT
LINER ACETABULAR 32X50 (Liner) ×1 IMPLANT
MANIFOLD NEPTUNE II (INSTRUMENTS) ×2 IMPLANT
NS IRRIG 1000ML POUR BTL (IV SOLUTION) ×2 IMPLANT
PACK TOTAL JOINT (CUSTOM PROCEDURE TRAY) ×2 IMPLANT
PAD ARMBOARD 7.5X6 YLW CONV (MISCELLANEOUS) ×2 IMPLANT
SET HNDPC FAN SPRY TIP SCT (DISPOSABLE) ×1 IMPLANT
STAPLER VISISTAT 35W (STAPLE) IMPLANT
STEM CORAIL KA09 (Stem) ×1 IMPLANT
STRIP CLOSURE SKIN 1/2X4 (GAUZE/BANDAGES/DRESSINGS) ×3 IMPLANT
SUT ETHIBOND NAB CT1 #1 30IN (SUTURE) ×3 IMPLANT
SUT MNCRL AB 4-0 PS2 18 (SUTURE) IMPLANT
SUT VIC AB 0 CT1 27 (SUTURE) ×2
SUT VIC AB 0 CT1 27XBRD ANBCTR (SUTURE) ×1 IMPLANT
SUT VIC AB 1 CT1 27 (SUTURE) ×2
SUT VIC AB 1 CT1 27XBRD ANBCTR (SUTURE) ×1 IMPLANT
SUT VIC AB 2-0 CT1 27 (SUTURE) ×2
SUT VIC AB 2-0 CT1 TAPERPNT 27 (SUTURE) ×1 IMPLANT
TOWEL GREEN STERILE (TOWEL DISPOSABLE) ×2 IMPLANT
TOWEL GREEN STERILE FF (TOWEL DISPOSABLE) ×2 IMPLANT
TRAY CATH 16FR W/PLASTIC CATH (SET/KITS/TRAYS/PACK) IMPLANT
TRAY FOLEY W/BAG SLVR 16FR (SET/KITS/TRAYS/PACK)
TRAY FOLEY W/BAG SLVR 16FR ST (SET/KITS/TRAYS/PACK) IMPLANT
WATER STERILE IRR 1000ML POUR (IV SOLUTION) ×4 IMPLANT

## 2020-11-20 NOTE — H&P (Signed)
TOTAL HIP ADMISSION H&P  Patient is admitted for left total hip arthroplasty.  Subjective:  Chief Complaint: left hip pain  HPI: Katherine Stanley, 75 y.o. female, has a history of pain and functional disability in the left hip(s) due to arthritis and patient has failed non-surgical conservative treatments for greater than 12 weeks to include NSAID's and/or analgesics, corticosteriod injections, flexibility and strengthening excercises, use of assistive devices, weight reduction as appropriate, and activity modification.  Onset of symptoms was gradual starting 2 years ago with gradually worsening course since that time.The patient noted no past surgery on the left hip(s).  Patient currently rates pain in the left hip at 10 out of 10 with activity. Patient has night pain, worsening of pain with activity and weight bearing, trendelenberg gait, pain that interfers with activities of daily living, and pain with passive range of motion. Patient has evidence of subchondral sclerosis, periarticular osteophytes, and joint space narrowing by imaging studies. This condition presents safety issues increasing the risk of falls.  There is no current active infection.  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    Arthritis    Diverticulosis    Headache    occasionally   Hypertension    takes Lisinopril daily   Joint pain    Osteopenia    PONV (postoperative  nausea and vomiting)    Slight nausea   Rosacea     Past Surgical History:  Procedure Laterality Date   CHOLECYSTECTOMY     CHOLECYSTECTOMY     COLONOSCOPY     COLONOSCOPY WITH PROPOFOL N/A 11/25/2019   Procedure: COLONOSCOPY WITH PROPOFOL;  Surgeon: Virgel Manifold, MD;  Location: [facility] ENDOSCOPY;  Service: Gastroenterology;  Laterality: N/A;   EYE SURGERY  2013   LASIK   REFRACTIVE SURGERY     SHOULDER ARTHROSCOPY WITH OPEN ROTATOR CUFF REPAIR AND DISTAL CLAVICLE ACROMINECTOMY Right 09/19/2014   Procedure: SHOULDER ARTHROSCOPY WITH MINI-OPEN ROTATOR CUFF REPAIR AND DISTAL CLAVICLE RESECTION, SUBACROMIAL DECOMPRESSION;  Surgeon: Garald Balding, MD;  Location: [place];  Service: Orthopedics;  Laterality: Right;   TONSILLECTOMY  at age 49   TOTAL KNEE ARTHROPLASTY Right 03/18/2016   TOTAL KNEE ARTHROPLASTY Right 03/18/2016   Procedure: TOTAL KNEE ARTHROPLASTY;  Surgeon: Garald Balding, MD;  Location: [place];  Service: Orthopedics;  Laterality: Right;   VAGINAL HYSTERECTOMY  at age 72    Current Facility-Administered Medications  Medication Dose Route Frequency Provider Last Rate Last Admin   ceFAZolin (ANCEF) IVPB 2g/100 mL premix  2 g Intravenous On Call to OR Pete Pelt, PA-C       lactated ringers infusion   Intravenous Continuous Annye Asa, MD 10 mL/hr at 11/20/20 1025 New Bag at 11/20/20 1025   tranexamic acid (CYKLOKAPRON) IVPB 1,000 mg  1,000 mg Intravenous To OR Pete Pelt, PA-C       Allergies  Allergen Reactions   Alendronate Other (See Comments)    Diffuse myalgia and arthralgia  Aleve [Naproxen Sodium] Nausea And Vomiting    Sick to stomach    Social History   Tobacco Use   Smoking status: Never   Smokeless tobacco: Never  Substance Use Topics   Alcohol use: No    Alcohol/week: 0.0 standard drinks    Family History  Problem Relation Age of Onset   Hypertension Mother    Heart disease Mother    Kidney disease Mother    Diabetes Father     Hypertension Father    Heart disease Father    Heart disease Brother    Cancer Maternal Aunt    Ovarian cancer Maternal Aunt 85   Stroke Brother    Alzheimer's disease Brother    Diabetes Paternal Grandmother    Breast cancer Neg Hx      Review of Systems  Musculoskeletal:  Positive for gait problem.  All other systems reviewed and are negative.  Objective:  Physical Exam Vitals reviewed.  Constitutional:      Appearance: Normal appearance.  HENT:     Head: Normocephalic and atraumatic.  Eyes:     Extraocular Movements: Extraocular movements intact.     Pupils: Pupils are equal, round, and reactive to light.  Cardiovascular:     Rate and Rhythm: Normal rate.     Pulses: Normal pulses.  Pulmonary:     Effort: Pulmonary effort is normal.  Abdominal:     Palpations: Abdomen is soft.  Musculoskeletal:     Cervical back: Normal range of motion.     Left hip: Tenderness and bony tenderness present. Decreased range of motion. Decreased strength.  Neurological:     Mental Status: She is alert and oriented to person, place, and time.  Psychiatric:        Behavior: Behavior normal.    Vital signs in last 24 hours: Temp:  [98 F (36.7 C)] 98 F (36.7 C) (08/09 1000) Pulse Rate:  [100] 100 (08/09 1000) Resp:  [18] 18 (08/09 1000) BP: (156-181)/(86-88) 156/86 (08/09 1019) SpO2:  [96 %] 96 % (08/09 1000) Weight:  [75.3 kg] 75.3 kg (08/09 1000)  Labs:   Estimated body mass index is 32.42 kg/m as calculated from the following:   Height as of this encounter: 5' (1.524 m).   Weight as of this encounter: 75.3 kg.   Imaging Review Plain radiographs demonstrate severe degenerative joint disease of the left hip(s). The bone quality appears to be good for age and reported activity level.      Assessment/Plan:  End stage arthritis, left hip(s)  The patient history, physical examination, clinical judgement of the provider and imaging studies are consistent with end  stage degenerative joint disease of the left hip(s) and total hip arthroplasty is deemed medically necessary. The treatment options including medical management, injection therapy, arthroscopy and arthroplasty were discussed at length. The risks and benefits of total hip arthroplasty were presented and reviewed. The risks due to aseptic loosening, infection, stiffness, dislocation/subluxation,  thromboembolic complications and other imponderables were discussed.  The patient acknowledged the explanation, agreed to proceed with the plan and consent was signed. Patient is being admitted for inpatient treatment for surgery, pain control, PT, OT, prophylactic antibiotics, VTE prophylaxis, progressive ambulation and ADL's and discharge planning.The patient is planning to be discharged home with home health services

## 2020-11-20 NOTE — Op Note (Signed)
NAME: Katherine Stanley, Katherine Stanley MEDICAL RECORD NO: [id number] ACCOUNT NO: [id number] DATE OF BIRTH: 06-05-1945 FACILITY: [facility] LOCATION: [facility] PHYSICIAN: Lind Guest. Ninfa Linden, MD  Operative Report   DATE OF PROCEDURE: 11/20/2020  PREOPERATIVE DIAGNOSIS:  Primary osteoarthritis and degenerative joint disease, left hip.  POSTOPERATIVE DIAGNOSIS:  Primary osteoarthritis and degenerative joint disease, left hip.  PROCEDURE:  Left total hip arthroplasty through direct anterior approach.  IMPLANTS:  DePuy sector Gription acetabular component size 50, size 32+0 neutral polyethylene liner, size 9 Corail femoral component with standard offset, size 32+1 metal hip ball.  SURGEON:  Lind Guest. Ninfa Linden, MD  ASSISTANT:  Ky Barban, RNFA  BLOOD LOSS:  200 mL.  ANTIBIOTICS:  2 g IV Ancef.  COMPLICATIONS:  None.  INDICATIONS:  The patient is a 75 year old female well known to me.  She has debilitating arthritis involving her left hip that has been getting worse for over a year now.  It is detrimentally affecting her mobility, her quality of life, and activities  of daily living.  She has a shorter leg on that side as a result of the arthritis in her left hip and this has been well documented with x-rays and clinical exam.  At this point, she has tried and failed all forms of conservative treatment and wishes to  proceed with a total hip arthroplasty on the left side.  She is fully aware of the risk of acute blood loss anemia, nerve or vessel injury, fracture, infection, dislocation, DVT, implant failure, and skin and soft tissue issues.  She understands our  goals are to decrease pain, improve mobility, and overall improve quality of life.  DESCRIPTION OF PROCEDURE:  After informed consent was obtained, appropriate left hip was marked.  She was brought to the operating room and sat up on the stretcher where spinal anesthesia was obtained.  She was laid in supine position on the stretcher.    Traction boots were placed on both her feet.  Next, she was placed supine on the Hana fracture table, the perineal post in place and both legs in line skeletal traction device and no traction applied.  We assessed her hip radiographically for getting  good starting point knowing that she is shorter on the left than the right and we need to lengthen her.  We then prepped the left hip with DuraPrep and sterile drapes.  A timeout was called.  She was identified as correct patient, correct left hip.  I  then made an incision just inferior and posterior to the anterior superior iliac spine and carried this obliquely down the leg.  We dissected down tensor fascia lata muscle.  Tensor fascia was then divided longitudinally to proceed with a direct anterior  approach to the hip.  We identified and cauterized circumflex vessels, identified the hip capsule, opened the hip capsule in L-type format finding a moderate joint effusion.  We placed Cobra retractors within the joint capsule around the medial and  lateral femoral neck and made our femoral neck cut with an oscillating saw just proximal to the lesser trochanter and completed this with an osteotome.  I placed a corkscrew guide in the femoral head and removed the femoral head its entirety and found a  wide area devoid of cartilage.  I then placed a bent Hohmann over the medial acetabular rim and removed remnants of the acetabular labrum and other debris, then began reaming under direct visualization from a size 43 reamer in a stepwise increments going  up to size 49  with all reamers placed under direct visualization. The last reamer was also placed under direct fluoroscopy, so I could obtain my depth of reaming, my inclination and anteversion.  I then placed real DePuy sector Gription acetabular  component size 50 and a 32+0 neutral polyethylene liner based on the positioning of the acetabular component and offset.  Attention was then turned to the femur. With  the leg externally rotated 120 degrees, extended, and adducted, we were able to place a  Mueller retractor medially and Hohman retractor behind the greater trochanter.  We released lateral joint capsule, used a box cutting osteotome to enter the femoral canal and a rongeur to lateralize.  We began broaching using the Corail broaching system  from a size 8 going all the way up to a size 9, which was correlated with what was preoperatively templated.  With size 9 neck with standard offset, we placed a standard offset neck and a 32+1 trial hip ball and reduced this in acetabulum.  We were  pleased with leg length, offset, range of motion, and stability, assessed radiographically and clinically.  We then dislocated the hip, removed the trial components.  I placed the real Corail femoral component size 9 with standard offset and the real  32+1 metal hip ball and again reduced this in acetabulum.  It was felt to be stable.  We then irrigated the soft tissue with normal saline solution using pulsatile lavage.  We closed the joint capsule with interrupted #1 Ethibond suture followed by #1  Vicryl to close the tensor fascia, 0 Vicryl was used to close the deep tissue, and 2-0 Vicryl was used to close subcutaneous tissue.  The skin was reapproximated with staples.  An Aquacel dressing was applied.  She was taken off the Hana table and taken  to recovery room in stable condition with all final counts being correct.  No complications noted.   ROH D: 11/20/2020 1:38:47 pm T: 11/20/2020 3:47:00 pm  JOB: [id number]

## 2020-11-20 NOTE — Evaluation (Signed)
Physical Therapy Evaluation Patient Details Name: Katherine Stanley MRN: [id number] DOB: 29-Oct-1945 Today's Date: 11/20/2020   History of Present Illness  Pt is 75 yo female s/p L anterior THA on 11/20/20.  She has hx of OA, prediabetes, R TKA 2017, osteopenia, and HTN.  Clinical Impression  Pt is s/p THA resulting in the deficits listed below (see PT Problem List). PT checked on pt initially and pt restless with c/o 10/10 pain and requesting pain meds ; RN checking on meds and was able to give pt Dilaudid; PT returned later and pain down to 6/10 but pt reports feeling "Drunk."  She was able to sit and EOB, stand, and perform lateral scoots with min guard to min A level, but unable to ambulate due to dizziness/room spinning (suspect medication related).  Pt expected to progress well with therapy as pain managed.  At baseline, she is independent and likes to golf.  She will have 24 hr support from spouse.  She is able to stay on main level of home but her bedroom is upstairs and would prefer going to bedroom if able.  Pt will benefit from skilled PT to increase their independence and safety with mobility to allow discharge to the venue listed below.      Follow Up Recommendations Follow surgeon's recommendation for DC plan and follow-up therapies;Supervision/Assistance - 24 hour    Equipment Recommendations  Rolling walker with 5" wheels    Recommendations for Other Services       Precautions / Restrictions Precautions Precautions: Fall Restrictions Weight Bearing Restrictions: Yes LLE Weight Bearing: Weight bearing as tolerated      Mobility  Bed Mobility Overal bed mobility: Needs Assistance Bed Mobility: Supine to Sit;Sit to Supine     Supine to sit: Min assist Sit to supine: Min assist   General bed mobility comments: Min A for L LE with cues for scooting and sequencing    Transfers Overall transfer level: Needs assistance Equipment used: None Transfers: Sit to/from  Stand;Lateral/Scoot Transfers Sit to Stand: Min guard        Lateral/Scoot Transfers: Min guard General transfer comment: Pt reports feeling "drunk" from meds (received Dilaudid prior to session).  She performed several partial stands with lateral scoot toward HOB.  When repositioning to lay down pt able to stand completely upright with use of bed rail.  Ambulation/Gait             General Gait Details: held due to dizziness - medication related  Stairs            Wheelchair Mobility    Modified Rankin (Stroke Patients Only)       Balance Overall balance assessment: Needs assistance Sitting-balance support: No upper extremity supported Sitting balance-Leahy Scale: Good     Standing balance support: Single extremity supported Standing balance-Leahy Scale: Poor Standing balance comment: requiring UE support and min guard                             Pertinent Vitals/Pain Pain Assessment: 0-10 Pain Score: 6  Pain Location: L hip Pain Descriptors / Indicators: Discomfort;Sore Pain Intervention(s): Limited activity within patient's tolerance;Monitored during session;Premedicated before session    [place] expects to be discharged to:: Private residence Living Arrangements: Spouse/significant other Available Help at Discharge: Family;Available 24 hours/day Type of Home: House Home Access: Level entry     Home Layout: Multi-level;Able to live on main level with bedroom/bathroom Home  Equipment: Cane - single point;Bedside commode;Shower seat;Grab bars - tub/shower      Prior Function Level of Independence: Independent         Comments: Pt independent with ADLs, IADLs, community ambulaition, driving, and likes to golf     Hand Dominance        Extremity/Trunk Assessment   Upper Extremity Assessment Upper Extremity Assessment: Overall WFL for tasks assessed    Lower Extremity Assessment Lower Extremity Assessment: LLE  deficits/detail LLE Deficits / Details: Expected post op changes; ROM WFL; MMT: ankle 5/5, knee 3/5, hip 2/5 LLE Sensation: WNL    Cervical / Trunk Assessment Cervical / Trunk Assessment: Normal  Communication   Communication: No difficulties  Cognition Arousal/Alertness: Awake/alert Behavior During Therapy: WFL for tasks assessed/performed Overall Cognitive Status: Within Functional Limits for tasks assessed                                        General Comments      Exercises Total Joint Exercises Ankle Circles/Pumps: AROM;Both;10 reps;Supine   Assessment/Plan    PT Assessment Patient needs continued PT services  PT Problem List Decreased strength;Decreased mobility;Decreased safety awareness;Decreased range of motion;Decreased activity tolerance;Decreased balance;Decreased knowledge of use of DME;Pain       PT Treatment Interventions DME instruction;Therapeutic activities;Gait training;Therapeutic exercise;Patient/family education;Modalities;Stair training;Balance training;Functional mobility training    PT Goals (Current goals can be found in the Care Plan section)  Acute Rehab PT Goals Patient Stated Goal: return home PT Goal Formulation: With patient Time For Goal Achievement: 12/04/20 Potential to Achieve Goals: Good    Frequency 7X/week   Barriers to discharge        Co-evaluation               AM-PAC PT "6 Clicks" Mobility  Outcome Measure Help needed turning from your back to your side while in a flat bed without using bedrails?: A Little Help needed moving from lying on your back to sitting on the side of a flat bed without using bedrails?: A Little Help needed moving to and from a bed to a chair (including a wheelchair)?: A Little Help needed standing up from a chair using your arms (e.g., wheelchair or bedside chair)?: A Little Help needed to walk in hospital room?: A Little Help needed climbing 3-5 steps with a railing? : A  Little 6 Click Score: 18    End of Session Equipment Utilized During Treatment: Gait belt Activity Tolerance: Patient tolerated treatment well Patient left: in bed;with call bell/phone within reach;with bed alarm set;with SCD's reapplied Nurse Communication: Mobility status PT Visit Diagnosis: Other abnormalities of gait and mobility (R26.89);Muscle weakness (generalized) (M62.81)    Time: [id number] PT Time Calculation (min) (ACUTE ONLY): 19 min   Charges:   PT Evaluation $PT Eval Low Complexity: 1 Low          Caretha Rumbaugh, PT Acute Rehab Services Pager [phone] [facility] [phone]   Karlton Lemon 11/20/2020, 5:54 PM

## 2020-11-20 NOTE — Anesthesia Preprocedure Evaluation (Signed)
Anesthesia Evaluation  Patient identified by MRN, date of birth, ID band Patient awake    Reviewed: Allergy & Precautions, NPO status , Patient's Chart, lab work & pertinent test results  History of Anesthesia Complications (+) PONV  Airway Mallampati: I  TM Distance: >3 FB Neck ROM: Full    Dental  (+) Teeth Intact, Dental Advidsory Given   Pulmonary neg pulmonary ROS,    breath sounds clear to auscultation       Cardiovascular hypertension, Pt. on medications (-) angina Rhythm:Regular Rate:Normal  Cardiology: low risk for TKR   Neuro/Psych  Headaches,    GI/Hepatic negative GI ROS, Neg liver ROS,   Endo/Other    Renal/GU negative Renal ROS     Musculoskeletal  (+) Arthritis , Osteoarthritis,    Abdominal (+) + obese,   Peds  Hematology negative hematology ROS (+)   Anesthesia Other Findings   Reproductive/Obstetrics                             Anesthesia Physical  Anesthesia Plan  ASA: II  Anesthesia Plan: Spinal   Post-op Pain Management:    Induction: Intravenous  PONV Risk Score and Plan: 3 and Ondansetron, Dexamethasone, Midazolam and Treatment may vary due to age or medical condition  Airway Management Planned: Natural Airway and Simple Face Mask  Additional Equipment:   Intra-op Plan:   Post-operative Plan:   Informed Consent: I have reviewed the patients History and Physical, chart, labs and discussed the procedure including the risks, benefits and alternatives for the proposed anesthesia with the patient or authorized representative who has indicated his/her understanding and acceptance.     Dental Advisory Given  Plan Discussed with: Surgeon and CRNA  Anesthesia Plan Comments: (Plan routine monitors, SAB)        Anesthesia Quick Evaluation

## 2020-11-20 NOTE — Transfer of Care (Signed)
Immediate Anesthesia Transfer of Care Note  Patient: Katherine Stanley  Procedure(s) Performed: LEFT TOTAL HIP ARTHROPLASTY ANTERIOR APPROACH (Left: Hip)  Patient Location: PACU  Anesthesia Type:MAC and Spinal  Level of Consciousness: awake, alert , oriented and patient cooperative  Airway & Oxygen Therapy: Patient Spontanous Breathing  Post-op Assessment: Report given to RN and Post -op Vital signs reviewed and stable  Post vital signs: Reviewed and stable  Last Vitals:  Vitals Value Taken Time  BP    Temp    Pulse 94 11/20/20 1402  Resp    SpO2 98 % 11/20/20 1402  Vitals shown include unvalidated device data.  Last Pain:  Vitals:   11/20/20 1018  TempSrc:   PainSc: 5       Patients Stated Pain Goal: 3 (64/35/39 1225)  Complications: No notable events documented.

## 2020-11-20 NOTE — Care Plan (Signed)
Ortho Bundle Case Management Note  Patient Details  Name: Katherine Stanley MRN: [id number] Date of Birth: 1945/05/15   [facility] call to discuss patient's upcoming Left total hip replacement with Dr. Ninfa Linden. She is an Ortho bundle patient through [facility] and is agreeable to case management. She lives with her husband, who will be assisting after surgery. She has a 3in1/BSC, but will need a FWW after surgery. This will be ordered through [facility] to be given to her in hospital prior to discharge. Will follow to see how she does with PT after surgery to determine need for HHPT. Will make referral to [facility] if needed. Reviewed all post op care instructions. Will continue to follow for needs.                   DME Arranged:  Walker rolling (Patient has 3in1/BSC at home already she reports.) DME Agency:  [facility]  HH Arranged:  PT [place] Agency:  [facility]  Additional Comments: Please contact me with any questions of if this plan should need to change.  Jamse Arn, RN, BSN, [employer]  [phone] 11/20/2020, 4:32 PM

## 2020-11-20 NOTE — Anesthesia Procedure Notes (Signed)
Spinal  Patient location during procedure: OR Start time: 11/20/2020 12:19 PM End time: 11/20/2020 12:24 PM Reason for block: surgical anesthesia Staffing Anesthesiologist: Lynda Rainwater, MD Preanesthetic Checklist Completed: patient identified, IV checked, site marked, risks and benefits discussed, surgical consent, monitors and equipment checked, pre-op evaluation and timeout performed Spinal Block Patient position: sitting Prep: DuraPrep Patient monitoring: heart rate, cardiac monitor, continuous pulse ox and blood pressure Approach: midline Location: L3-4 Injection technique: single-shot Needle Needle type: Quincke  Needle gauge: 22 G Needle length: 9 cm Assessment Sensory level: T4 Events: CSF return

## 2020-11-20 NOTE — Brief Op Note (Signed)
11/20/2020  1:40 PM  PATIENT:  Katherine Stanley  75 y.o. female  PRE-OPERATIVE DIAGNOSIS:  left hip osteoarthritis  POST-OPERATIVE DIAGNOSIS:  left hip osteoarthritis  PROCEDURE:  Procedure(s) with comments: LEFT TOTAL HIP ARTHROPLASTY ANTERIOR APPROACH (Left) - RNFA  SURGEON:  Surgeon(s) and Role:    Mcarthur Rossetti, MD - Primary  ASSISTANTS:  Ky Barban, RNFA   ANESTHESIA:   spinal  COUNTS:  YES  DICTATION: .Other Dictation: Dictation Number [id number]  PLAN OF CARE: Admit for overnight observation  PATIENT DISPOSITION:  PACU - hemodynamically stable.   Delay start of Pharmacological VTE agent (>24hrs) due to surgical blood loss or risk of bleeding: no

## 2020-11-21 DIAGNOSIS — R7303 Prediabetes: Secondary | ICD-10-CM | POA: Diagnosis not present

## 2020-11-21 DIAGNOSIS — Z96651 Presence of right artificial knee joint: Secondary | ICD-10-CM | POA: Diagnosis not present

## 2020-11-21 DIAGNOSIS — M1612 Unilateral primary osteoarthritis, left hip: Secondary | ICD-10-CM | POA: Diagnosis not present

## 2020-11-21 DIAGNOSIS — I10 Essential (primary) hypertension: Secondary | ICD-10-CM | POA: Diagnosis not present

## 2020-11-21 LAB — CBC
HCT: 36.4 % (ref 36.0–46.0)
Hemoglobin: 12.3 g/dL (ref 12.0–15.0)
MCH: 31.1 pg (ref 26.0–34.0)
MCHC: 33.8 g/dL (ref 30.0–36.0)
MCV: 92.2 fL (ref 80.0–100.0)
Platelets: 195 10*3/uL (ref 150–400)
RBC: 3.95 MIL/uL (ref 3.87–5.11)
RDW: 11.9 % (ref 11.5–15.5)
WBC: 11.5 10*3/uL — ABNORMAL HIGH (ref 4.0–10.5)
nRBC: 0 % (ref 0.0–0.2)

## 2020-11-21 LAB — BASIC METABOLIC PANEL
Anion gap: 8 (ref 5–15)
BUN: 12 mg/dL (ref 8–23)
CO2: 21 mmol/L — ABNORMAL LOW (ref 22–32)
Calcium: 8.7 mg/dL — ABNORMAL LOW (ref 8.9–10.3)
Chloride: 105 mmol/L (ref 98–111)
Creatinine, Ser: 0.55 mg/dL (ref 0.44–1.00)
GFR, Estimated: >60 mL/min (ref >60)
Glucose, Bld: 127 mg/dL — ABNORMAL HIGH (ref 70–99)
Potassium: 3.9 mmol/L (ref 3.5–5.1)
Sodium: 134 mmol/L — ABNORMAL LOW (ref 135–145)

## 2020-11-21 MED ORDER — METHOCARBAMOL 500 MG PO TABS: 500.0000 mg | ORAL_TABLET | Freq: Four times a day (QID) | ORAL | 1 refills | Status: DC | PRN

## 2020-11-21 MED ORDER — OXYCODONE HCL 5 MG PO TABS: 5.0000 mg | ORAL_TABLET | Freq: Four times a day (QID) | ORAL | 0 refills | Status: DC | PRN

## 2020-11-21 MED ORDER — ASPIRIN 81 MG PO CHEW: 81.0000 mg | CHEWABLE_TABLET | Freq: Two times a day (BID) | ORAL | 0 refills | Status: DC

## 2020-11-21 NOTE — Progress Notes (Signed)
Physical Therapy Treatment Patient Details Name: Katherine Stanley MRN: [id number] DOB: [birth year] Today's Date: 11/21/2020    History of Present Illness Pt is 75 yo female s/p L anterior THA on 11/20/20.  She has hx of OA, prediabetes, R TKA 2017, osteopenia, and HTN.    PT Comments    Patient eager to get OOB. Assisted into bathroom and ambulated in hall with minguard assist and initial cues for sequencing. Pt progressed to no cues needed during gait. Cues for sequencing with RW during transfers. Patient has a room "made up" on main level so she will not need to go to her upstairs bedroom right away. Patient could discharge this p.m. from a PT perspective.      Follow Up Recommendations  Follow surgeon's recommendation for DC plan and follow-up therapies;Supervision/Assistance - 24 hour     Equipment Recommendations  Rolling walker with 5" wheels    Recommendations for Other Services       Precautions / Restrictions Precautions Precautions: Fall Restrictions Weight Bearing Restrictions: Yes LLE Weight Bearing: Weight bearing as tolerated    Mobility  Bed Mobility Overal bed mobility: Needs Assistance Bed Mobility: Supine to Sit     Supine to sit: Min guard     General bed mobility comments: cues for sequencing; near need for assist with close guarding    Transfers Overall transfer level: Needs assistance Equipment used: Rolling walker (2 wheeled) Transfers: Sit to/from Stand Sit to Stand: Min guard         General transfer comment: from bed, from toilet; vc for safest hand placment  Ambulation/Gait Ambulation/Gait assistance: Supervision Gait Distance (Feet): 100 Feet Assistive device: Rolling walker (2 wheeled) Gait Pattern/deviations: Step-to pattern;Step-through pattern;Decreased step length - right;Antalgic     General Gait Details: pt initially with difficulty sequencing and attempted progression to step-thru with RW continuously moving-this caused  incr in antalgic pattern and returned to step-to pattern with better sequencing by pt   Stairs Stairs:  (deferred; pt has bedroom set up on main floor)           Wheelchair Mobility    Modified Rankin (Stroke Patients Only)       Balance Overall balance assessment: Needs assistance Sitting-balance support: No upper extremity supported Sitting balance-Leahy Scale: Good     Standing balance support: Single extremity supported;No upper extremity supported Standing balance-Leahy Scale: Fair                              Cognition Arousal/Alertness: Awake/alert Behavior During Therapy: WFL for tasks assessed/performed Overall Cognitive Status: Within Functional Limits for tasks assessed                                        Exercises Total Joint Exercises Ankle Circles/Pumps: AROM;Both;10 reps;Supine Quad Sets: AROM;Both;5 reps Gluteal Sets: AROM;Both;5 reps    General Comments        Pertinent Vitals/Pain Pain Assessment: 0-10 Pain Score: 4  Pain Location: L hip Pain Descriptors / Indicators: Discomfort;Sore Pain Intervention(s): Limited activity within patient's tolerance;Monitored during session    Home Living                      Prior Function            PT Goals (current goals can now be found in the care  plan section) Acute Rehab PT Goals Patient Stated Goal: return home Time For Goal Achievement: 12/04/20 Potential to Achieve Goals: Good Progress towards PT goals: Progressing toward goals    Frequency    7X/week      PT Plan Current plan remains appropriate    Co-evaluation              AM-PAC PT "6 Clicks" Mobility   Outcome Measure  Help needed turning from your back to your side while in a flat bed without using bedrails?: A Little Help needed moving from lying on your back to sitting on the side of a flat bed without using bedrails?: A Little Help needed moving to and from a bed to a  chair (including a wheelchair)?: A Little Help needed standing up from a chair using your arms (e.g., wheelchair or bedside chair)?: A Little Help needed to walk in hospital room?: A Little Help needed climbing 3-5 steps with a railing? : A Little 6 Click Score: 18    End of Session   Activity Tolerance: Patient tolerated treatment well Patient left: with call bell/phone within reach;in chair   PT Visit Diagnosis: Other abnormalities of gait and mobility (R26.89);Muscle weakness (generalized) (M62.81)     Time: [id number] PT Time Calculation (min) (ACUTE ONLY): 21 min  Charges:  $Gait Training: 8-22 mins                      Katherine Stanley, PT Pager [phone]    Katherine Stanley 11/21/2020, 12:15 PM

## 2020-11-21 NOTE — Discharge Summary (Signed)
Patient ID: Katherine Stanley MRN: [id number] DOB/AGE: 1945-05-25 75 y.o.  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    Arthritis    Diverticulosis    Headache    occasionally   Hypertension    takes Lisinopril daily   Joint pain    Osteopenia    PONV (postoperative nausea and vomiting)    Slight nausea   Rosacea     Surgeries: Procedure(s): LEFT TOTAL HIP ARTHROPLASTY ANTERIOR APPROACH on 11/20/2020   Consultants:   Discharged Condition: Improved  Hospital Course: Katherine Stanley is an 75 y.o. female who was admitted 11/20/2020 for operative treatment ofUnilateral primary osteoarthritis, left hip. Patient has severe unremitting pain that affects sleep, daily activities, and work/hobbies. After pre-op clearance the patient was taken to the operating room on 11/20/2020 and underwent  Procedure(s): LEFT TOTAL HIP ARTHROPLASTY ANTERIOR APPROACH.    Patient was given perioperative antibiotics:  Anti-infectives (From admission, onward)    Start     Dose/Rate Route Frequency Ordered Stop   11/20/20 1730  ceFAZolin (ANCEF) IVPB 1 g/50 mL premix        1 g 100 mL/hr over 30 Minutes Intravenous Every 6 hours 11/20/20 1357 11/20/20 2335   11/20/20 1250  vancomycin (VANCOCIN) powder  Status:  Discontinued          As needed 11/20/20 1319 11/20/20 1402   11/20/20 1015  ceFAZolin (ANCEF) IVPB 2g/100 mL premix        2 g 200 mL/hr over 30 Minutes Intravenous On call to O.R. 11/20/20 1012 11/20/20 1230        Patient was given sequential compression devices, early ambulation, and chemoprophylaxis to prevent DVT.  Patient benefited maximally from hospital stay and there were no complications.     Recent vital signs: Patient Vitals for the past 24 hrs:  BP Temp Temp src Pulse Resp SpO2  11/21/20 1218 (!) 141/78 98.1 F (36.7 C) Oral (!) 108 17 96 %  11/21/20 0824 (!) 160/85 98.1 F (36.7 C) Oral (!) 105 18 96 %  11/21/20 0609 (!) 158/93 98.5 F (36.9 C) Oral (!) 105 18 98 %  11/21/20 0209 (!) 154/78 98.2 F (36.8 C) Oral (!) 108 18 96 %  11/20/20 2204 134/74 98.6 F (37 C) Oral (!) 108 16 97 %  11/20/20 1614 -- -- -- -- -- 100 %  11/20/20 1608 129/69 97.9 F (36.6 C) Oral 78 20 100 %  11/20/20 1537 (!) 144/81 -- -- 91 (!) 24 100 %  11/20/20 1522 128/78 -- -- 87 (!) 23 100 %  11/20/20 1507 127/76 -- -- 90 (!) 24 98 %  11/20/20 1452 121/72 -- -- 77 16 99 %  11/20/20 1437 (!) 115/54 -- -- 82 12 99 %  11/20/20 1430 103/63 -- -- 81 (!) 21 99 %  11/20/20 1418 (!) 80/47 -- -- 91 16 98 %  11/20/20 1407 94/79 -- -- 89 18 97 %  11/20/20 1402 (!) 86/57 97.6 F (36.4 C) -- 94 18 98 %  11/20/20 1356 -- -- -- -- -- 100 %     Recent laboratory studies:  Recent Labs    11/21/20 0205  WBC 11.5*  HGB 12.3  HCT 36.4  PLT 195  NA 134*  K 3.9  CL 105  CO2 21*  BUN 12  CREATININE 0.55  GLUCOSE 127*  CALCIUM 8.7*     Discharge Medications:   Allergies as of 11/21/2020       Reactions   Alendronate Other (See Comments)   Diffuse myalgia and arthralgia   Aleve [naproxen Sodium] Nausea And Vomiting   Sick to stomach        Medication List     STOP taking these medications    aspirin EC 81 MG tablet Replaced by: aspirin 81 MG chewable tablet       TAKE these medications    acetaminophen 650 MG CR tablet Commonly known as: TYLENOL Take 1,300 mg by mouth every 8 (eight) hours as needed for pain.   aspirin 81 MG chewable tablet Chew 1 tablet (81 mg total) by mouth 2 (two) times daily. Replaces: aspirin EC 81 MG tablet   cholecalciferol 1000 units tablet Commonly known as: VITAMIN D Take 1,000 Units by mouth daily.   CHOLEST OFF PO Take 1 tablet by  mouth daily.   CoQ-10 100 MG Caps Take 100 mg by mouth daily.   diclofenac Sodium 1 % Gel Commonly known as: VOLTAREN Apply 1 application topically 4 (four) times daily as needed (pain).   DRY EYE RELIEF OP Place 1 drop into both eyes daily.   EYE VITAMINS PO Take 1 tablet by mouth daily.   ferrous gluconate 240 (27 FE) MG tablet Commonly known as: FERGON Take 240 mg by mouth daily.   ibuprofen 200 MG tablet Commonly known as: ADVIL Take 400 mg by mouth every 6 (six) hours as needed for moderate pain.   Krill Oil 350 MG Caps Take 350 mg by mouth daily.   Lysine 500 MG Caps Take 500 mg by mouth daily as needed (fever blisters).   methocarbamol 500 MG tablet Commonly known as: ROBAXIN Take 1 tablet (500 mg total) by mouth every 6 (six) hours as needed for muscle spasms.   OVER THE COUNTER MEDICATION Take 1 tablet by mouth daily. Beet root supplement   oxyCODONE 5 MG immediate release tablet Commonly known as: Oxy IR/ROXICODONE Take 1-2 tablets (5-10 mg total) by mouth every 6 (six) hours as needed for moderate pain (pain score 4-6).   Potassium 99 MG Tabs Take 99 mg by mouth daily.   vitamin B-12 1000 MCG tablet Commonly known as: CYANOCOBALAMIN Take 1,000 mcg by mouth daily.       ASK your doctor about these medications    lisinopril 10 MG tablet Commonly known as: ZESTRIL Take 1/2 (one-half) tablet by mouth once daily               Durable Medical Equipment  (From admission, onward)           Start     Ordered   11/20/20 1614  DME 3 n 1  Once        11/20/20 1613   11/20/20 1614  DME Walker rolling  Once       Question Answer Comment  Walker: With 5 Inch Wheels   Patient needs a walker to treat with the following condition Status post left hip replacement      11/20/20 1613            Diagnostic Studies: DG Pelvis Portable  Result Date: 11/20/2020 CLINICAL DATA:  Status post LEFT hip replacement EXAM: PORTABLE PELVIS 1-2 VIEWS  COMPARISON:  None. FINDINGS: LEFT total hip arthroplasty. No fracture or dislocation. Expected soft tissue changes about the arthroplasty. IMPRESSION: LEFT total hip arthroplasty without complication. Electronically Signed   By: Suzy Bouchard M.D.   On: 11/20/2020 14:44   DG C-Arm 1-60 Min  Result Date: 11/20/2020 CLINICAL DATA:  Elective surgery Z41.9 (ICD-10-CM). Additional history provided: Left total hip arthroplasty anterior approach for left hip osteoarthritis. Provided fluoroscopy time: 16 seconds (2.06 mGy). EXAM: OPERATIVE left HIP (WITH PELVIS IF PERFORMED) 3 VIEWS TECHNIQUE: Fluoroscopic spot image(s) were submitted for interpretation post-operatively. COMPARISON:  Radiographs of the left hip 08/08/2020 (images available, report unavailable). FINDINGS: Three intraoperative fluoroscopic images of the left hip are submitted. On the provided images, there are findings of interval left total hip arthroplasty. The femoral and acetabular components appear well seated. No unexpected finding on the provided views. IMPRESSION: Three intraoperative fluoroscopic images of the left hip from left total hip arthroplasty. Electronically Signed   By: Kellie Simmering DO   On: 11/20/2020 14:08   DG HIP OPERATIVE UNILAT WITH PELVIS LEFT  Result Date: 11/20/2020 CLINICAL DATA:  Elective surgery Z41.9 (ICD-10-CM). Additional history provided: Left total hip arthroplasty anterior approach for left hip osteoarthritis. Provided fluoroscopy time: 16 seconds (2.06 mGy). EXAM: OPERATIVE left HIP (WITH PELVIS IF PERFORMED) 3 VIEWS TECHNIQUE: Fluoroscopic spot image(s) were submitted for interpretation post-operatively. COMPARISON:  Radiographs of the left hip 08/08/2020 (images available, report unavailable). FINDINGS: Three intraoperative fluoroscopic images of the left hip are submitted. On the provided images, there are findings of interval left total hip arthroplasty. The femoral and acetabular components appear well  seated. No unexpected finding on the provided views. IMPRESSION: Three intraoperative fluoroscopic images of the left hip from left total hip arthroplasty. Electronically Signed   By: Kellie Simmering DO   On: 11/20/2020 14:08    Disposition: Discharge disposition: 01-Home or Self Care          Follow-up Information     Mcarthur Rossetti, MD. Go on 12/03/2020.   Specialty: Orthopedic Surgery Why: at 1:30 pm for your 2 week in office appointment with Dr. Ninfa Linden. Contact information: [address] [place] [address] [phone]                  Signed: Mcarthur Rossetti 11/21/2020, 12:34 PM

## 2020-11-21 NOTE — Plan of Care (Signed)

## 2020-11-21 NOTE — Progress Notes (Signed)
Patient discharged home with husband on private car. VSS at time of discharge. Discharge instructions explained and provided to patient and family member. All questions answered. IV out, site CDI. Patient's belongings sent home.

## 2020-11-21 NOTE — TOC Transition Note (Signed)
Transition of Care [facility]) - CM/SW Discharge Note   Patient Details  Name: Katherine Stanley MRN: [id number] Date of Birth: 1945/09/25  [facility] of [facility] CM/SW Contact:  Sharin Mons, RN Phone Number: 11/21/2020, 1:18 PM   Clinical Narrative:    Patient will DC to: home Anticipated DC date: 11/21/2020 Family notified: yes Transport by: car             -s/p L anterior THA on 11/20/20  Per MD patient ready for DC today. RN, patient, patient's family, and [place] notified of DC. DME : rolling walker will be delivered to beside prior to d/c. Pt without Rx med  concerns. Post hospital f/u noted on AVS. Husband to provide transportation to home  RNCM will sign off for now as intervention is no longer needed. Please consult [place] again if new needs arise.   Final next level of care: [place]     Patient Goals and CMS Choice     Choice offered to / list presented to : Patient  Discharge Placement                       Discharge Plan and Services                DME Arranged: Walker rolling (Patient has 3in1/BSC at home already she reports.) DME Agency: [employer]       HH Arranged: PT HH Agency: [facility] Date [place]: 11/21/20 Time [place]: 21 Representative spoke with at [place]: [place] ([place]) Interventions     Readmission Risk Interventions No flowsheet data found.

## 2020-11-21 NOTE — Anesthesia Postprocedure Evaluation (Signed)
Anesthesia Post Note  Patient: Katherine Stanley  Procedure(s) Performed: LEFT TOTAL HIP ARTHROPLASTY ANTERIOR APPROACH (Left: Hip)     Patient location during evaluation: PACU Anesthesia Type: Spinal Level of consciousness: awake and alert Pain management: pain level controlled Vital Signs Assessment: post-procedure vital signs reviewed and stable Respiratory status: spontaneous breathing Cardiovascular status: stable Anesthetic complications: no   No notable events documented.  Last Vitals:  Vitals:   11/21/20 0609 11/21/20 0824  BP: (!) 158/93 (!) 160/85  Pulse: (!) 105 (!) 105  Resp: 18 18  Temp: 36.9 C 36.7 C  SpO2: 98% 96%    Last Pain:  Vitals:   11/21/20 0824  TempSrc: Oral  PainSc:                  Nolon Nations

## 2020-11-21 NOTE — Plan of Care (Signed)

## 2020-11-21 NOTE — Discharge Instructions (Signed)

## 2020-11-21 NOTE — Progress Notes (Signed)
Patient ID: Katherine Stanley, female   DOB: 1945/06/21, 75 y.o.   MRN: [id number] Doing well enough to go home this afternoon.

## 2020-11-21 NOTE — Progress Notes (Signed)
Subjective: 1 Day Post-Op Procedure(s) (LRB): LEFT TOTAL HIP ARTHROPLASTY ANTERIOR APPROACH (Left) Patient reports pain as moderate.    Objective: Vital signs in last 24 hours: Temp:  [97.6 F (36.4 C)-98.6 F (37 C)] 98.5 F (36.9 C) (08/10 0609) Pulse Rate:  [77-108] 105 (08/10 0609) Resp:  [12-24] 18 (08/10 0609) BP: (80-181)/(47-93) 158/93 (08/10 0609) SpO2:  [96 %-100 %] 98 % (08/10 0609) FiO2 (%):  [21 %] 21 % (08/09 1614) Weight:  [75.3 kg] 75.3 kg (08/09 1000)  Intake/Output from previous day: 08/09 0701 - 08/10 0700 In: 800 [I.V.:600; IV Piggyback:200] Out: 1400 [Blood:1400] Intake/Output this shift: No intake/output data recorded.  Recent Labs    11/21/20 0205  HGB 12.3   Recent Labs    11/21/20 0205  WBC 11.5*  RBC 3.95  HCT 36.4  PLT 195   Recent Labs    11/21/20 0205  NA 134*  K 3.9  CL 105  CO2 21*  BUN 12  CREATININE 0.55  GLUCOSE 127*  CALCIUM 8.7*   No results for input(s): LABPT, INR in the last 72 hours.  Sensation intact distally Intact pulses distally Dorsiflexion/Plantar flexion intact Incision: dressing C/D/I   Assessment/Plan: 1 Day Post-Op Procedure(s) (LRB): LEFT TOTAL HIP ARTHROPLASTY ANTERIOR APPROACH (Left) Up with therapy Discharge home with home health possibly this afternoon depending on mobility      Mcarthur Rossetti 11/21/2020, 7:13 AM

## 2020-11-22 ENCOUNTER — Telehealth: Payer: Self-pay | Admitting: *Deleted

## 2020-11-22 ENCOUNTER — Encounter (HOSPITAL_COMMUNITY): Payer: Self-pay | Admitting: Orthopaedic Surgery

## 2020-11-22 NOTE — Telephone Encounter (Signed)
Ortho bundle D/C call completed. 

## 2020-11-23 ENCOUNTER — Encounter: Payer: Self-pay | Admitting: Family Medicine

## 2020-11-23 DIAGNOSIS — Z96642 Presence of left artificial hip joint: Secondary | ICD-10-CM | POA: Diagnosis not present

## 2020-11-23 DIAGNOSIS — R7303 Prediabetes: Secondary | ICD-10-CM | POA: Diagnosis not present

## 2020-11-23 DIAGNOSIS — Z96651 Presence of right artificial knee joint: Secondary | ICD-10-CM | POA: Diagnosis not present

## 2020-11-23 DIAGNOSIS — M1712 Unilateral primary osteoarthritis, left knee: Secondary | ICD-10-CM | POA: Diagnosis not present

## 2020-11-23 DIAGNOSIS — M8580 Other specified disorders of bone density and structure, unspecified site: Secondary | ICD-10-CM | POA: Diagnosis not present

## 2020-11-23 DIAGNOSIS — I10 Essential (primary) hypertension: Secondary | ICD-10-CM | POA: Diagnosis not present

## 2020-11-23 DIAGNOSIS — K5790 Diverticulosis of intestine, part unspecified, without perforation or abscess without bleeding: Secondary | ICD-10-CM | POA: Diagnosis not present

## 2020-11-23 DIAGNOSIS — Z7982 Long term (current) use of aspirin: Secondary | ICD-10-CM | POA: Diagnosis not present

## 2020-11-23 DIAGNOSIS — Z471 Aftercare following joint replacement surgery: Secondary | ICD-10-CM | POA: Diagnosis not present

## 2020-11-27 DIAGNOSIS — Z471 Aftercare following joint replacement surgery: Secondary | ICD-10-CM | POA: Diagnosis not present

## 2020-11-27 DIAGNOSIS — R7303 Prediabetes: Secondary | ICD-10-CM | POA: Diagnosis not present

## 2020-11-27 DIAGNOSIS — I10 Essential (primary) hypertension: Secondary | ICD-10-CM | POA: Diagnosis not present

## 2020-11-27 DIAGNOSIS — K5790 Diverticulosis of intestine, part unspecified, without perforation or abscess without bleeding: Secondary | ICD-10-CM | POA: Diagnosis not present

## 2020-11-27 DIAGNOSIS — M8580 Other specified disorders of bone density and structure, unspecified site: Secondary | ICD-10-CM | POA: Diagnosis not present

## 2020-11-27 DIAGNOSIS — M1712 Unilateral primary osteoarthritis, left knee: Secondary | ICD-10-CM | POA: Diagnosis not present

## 2020-11-28 ENCOUNTER — Telehealth: Payer: Self-pay | Admitting: *Deleted

## 2020-11-28 DIAGNOSIS — I10 Essential (primary) hypertension: Secondary | ICD-10-CM | POA: Diagnosis not present

## 2020-11-28 DIAGNOSIS — R7303 Prediabetes: Secondary | ICD-10-CM | POA: Diagnosis not present

## 2020-11-28 DIAGNOSIS — Z471 Aftercare following joint replacement surgery: Secondary | ICD-10-CM | POA: Diagnosis not present

## 2020-11-28 DIAGNOSIS — M8580 Other specified disorders of bone density and structure, unspecified site: Secondary | ICD-10-CM | POA: Diagnosis not present

## 2020-11-28 DIAGNOSIS — M1712 Unilateral primary osteoarthritis, left knee: Secondary | ICD-10-CM | POA: Diagnosis not present

## 2020-11-28 DIAGNOSIS — K5790 Diverticulosis of intestine, part unspecified, without perforation or abscess without bleeding: Secondary | ICD-10-CM | POA: Diagnosis not present

## 2020-11-28 NOTE — Telephone Encounter (Signed)
Attempted 7 day call to patient; no answer and left VM requesting call back.

## 2020-11-29 ENCOUNTER — Telehealth: Payer: Self-pay | Admitting: *Deleted

## 2020-11-29 DIAGNOSIS — K5790 Diverticulosis of intestine, part unspecified, without perforation or abscess without bleeding: Secondary | ICD-10-CM | POA: Diagnosis not present

## 2020-11-29 DIAGNOSIS — Z471 Aftercare following joint replacement surgery: Secondary | ICD-10-CM | POA: Diagnosis not present

## 2020-11-29 DIAGNOSIS — M1712 Unilateral primary osteoarthritis, left knee: Secondary | ICD-10-CM | POA: Diagnosis not present

## 2020-11-29 DIAGNOSIS — I10 Essential (primary) hypertension: Secondary | ICD-10-CM | POA: Diagnosis not present

## 2020-11-29 DIAGNOSIS — M8580 Other specified disorders of bone density and structure, unspecified site: Secondary | ICD-10-CM | POA: Diagnosis not present

## 2020-11-29 DIAGNOSIS — R7303 Prediabetes: Secondary | ICD-10-CM | POA: Diagnosis not present

## 2020-11-29 NOTE — Telephone Encounter (Signed)
Ortho bundle 7 day call completed. 

## 2020-12-03 ENCOUNTER — Ambulatory Visit (INDEPENDENT_AMBULATORY_CARE_PROVIDER_SITE_OTHER): Payer: Medicare Other | Admitting: Orthopaedic Surgery

## 2020-12-03 ENCOUNTER — Telehealth: Payer: Self-pay | Admitting: *Deleted

## 2020-12-03 ENCOUNTER — Encounter: Payer: Self-pay | Admitting: Orthopaedic Surgery

## 2020-12-03 DIAGNOSIS — M1712 Unilateral primary osteoarthritis, left knee: Secondary | ICD-10-CM | POA: Diagnosis not present

## 2020-12-03 DIAGNOSIS — M8580 Other specified disorders of bone density and structure, unspecified site: Secondary | ICD-10-CM | POA: Diagnosis not present

## 2020-12-03 DIAGNOSIS — I10 Essential (primary) hypertension: Secondary | ICD-10-CM | POA: Diagnosis not present

## 2020-12-03 DIAGNOSIS — K5790 Diverticulosis of intestine, part unspecified, without perforation or abscess without bleeding: Secondary | ICD-10-CM | POA: Diagnosis not present

## 2020-12-03 DIAGNOSIS — Z471 Aftercare following joint replacement surgery: Secondary | ICD-10-CM | POA: Diagnosis not present

## 2020-12-03 DIAGNOSIS — Z96642 Presence of left artificial hip joint: Secondary | ICD-10-CM

## 2020-12-03 DIAGNOSIS — R7303 Prediabetes: Secondary | ICD-10-CM | POA: Diagnosis not present

## 2020-12-03 NOTE — Progress Notes (Signed)
The patient is at her first postoperative visit 2 weeks status post a left total hip arthroplasty.  She is doing well and is already driving.  She is only taking Tylenol for pain and has been on a baby aspirin twice daily.  Her incision looks great over her left hip.  I remove the staples and placed Steri-Strips.  Her leg lengths are equal.  She does have some foot swelling but her calf is soft.  We talked at length about her hip and the things she should and should not do.  She will still work on pumping her feet and I recommended compressive hose to wear.  We will see her back in a month to see how she is doing overall.  She will go back to just 1 baby aspirin a day that she was on prior to surgery.

## 2020-12-03 NOTE — Telephone Encounter (Signed)
Ortho bundle 14 day in office meeting completed. °

## 2020-12-05 DIAGNOSIS — I10 Essential (primary) hypertension: Secondary | ICD-10-CM | POA: Diagnosis not present

## 2020-12-05 DIAGNOSIS — M1712 Unilateral primary osteoarthritis, left knee: Secondary | ICD-10-CM | POA: Diagnosis not present

## 2020-12-05 DIAGNOSIS — Z471 Aftercare following joint replacement surgery: Secondary | ICD-10-CM | POA: Diagnosis not present

## 2020-12-05 DIAGNOSIS — R7303 Prediabetes: Secondary | ICD-10-CM | POA: Diagnosis not present

## 2020-12-05 DIAGNOSIS — M8580 Other specified disorders of bone density and structure, unspecified site: Secondary | ICD-10-CM | POA: Diagnosis not present

## 2020-12-05 DIAGNOSIS — K5790 Diverticulosis of intestine, part unspecified, without perforation or abscess without bleeding: Secondary | ICD-10-CM | POA: Diagnosis not present

## 2020-12-14 ENCOUNTER — Ambulatory Visit
Admission: RE | Admit: 2020-12-14 | Discharge: 2020-12-14 | Disposition: A | Discharge status: Home / Self Care | Payer: Medicare Other | Source: Ambulatory Visit | Attending: Family Medicine | Admitting: Family Medicine

## 2020-12-14 ENCOUNTER — Other Ambulatory Visit: Payer: Self-pay

## 2020-12-14 DIAGNOSIS — Z1231 Encounter for screening mammogram for malignant neoplasm of breast: Secondary | ICD-10-CM | POA: Insufficient documentation

## 2020-12-31 ENCOUNTER — Encounter: Payer: Self-pay | Admitting: Orthopaedic Surgery

## 2020-12-31 ENCOUNTER — Ambulatory Visit (INDEPENDENT_AMBULATORY_CARE_PROVIDER_SITE_OTHER): Payer: Medicare Other | Admitting: Orthopaedic Surgery

## 2020-12-31 DIAGNOSIS — G8929 Other chronic pain: Secondary | ICD-10-CM | POA: Diagnosis not present

## 2020-12-31 DIAGNOSIS — Z96642 Presence of left artificial hip joint: Secondary | ICD-10-CM

## 2020-12-31 DIAGNOSIS — M25562 Pain in left knee: Secondary | ICD-10-CM

## 2020-12-31 MED ORDER — LIDOCAINE HCL 1 % IJ SOLN
3.0000 mL | INTRAMUSCULAR | Status: AC | PRN
  Administered 2020-12-31: 3 mL

## 2020-12-31 MED ORDER — METHYLPREDNISOLONE ACETATE 40 MG/ML IJ SUSP
40.0000 mg | INTRAMUSCULAR | Status: AC | PRN
  Administered 2020-12-31: 40 mg via INTRA_ARTICULAR

## 2020-12-31 NOTE — Progress Notes (Signed)
Office Visit Note   Patient: Katherine Stanley           Date of Birth: 1946/02/04           MRN: [id number] Visit Date: 12/31/2020              Requested by: [name], MD [address] [place],  [place] [address] PCP: [name], MD   Assessment & Plan: Visit Diagnoses:  1. Status post total replacement of left hip   2. Chronic pain of left knee     Plan:   Per the patient's request I did place a steroid injection in her left knee without difficulty.  All questions and concerns were answered and addressed.  I do not need to see her back now for 6 months for postoperative visit at that point for her left hip unless there are issues.  At that visit I like a standing low AP pelvis and lateral of her left operative hip.  Follow-Up Instructions: Return in about 6 months (around 06/30/2021).   Orders:  Orders Placed This Encounter  Procedures   Large Joint Inj    No orders of the defined types were placed in this encounter.     Procedures: Large Joint Inj: L knee on 12/31/2020 9:08 AM Indications: diagnostic evaluation and pain Details: 22 G 1.5 in needle, superolateral approach  Arthrogram: No  Medications: 3 mL lidocaine 1 %; 40 mg methylPREDNISolone acetate 40 MG/ML Outcome: tolerated well, no immediate complications Procedure, treatment alternatives, risks and benefits explained, specific risks discussed. Consent was given by the patient. Immediately prior to procedure a time out was called to verify the correct patient, procedure, equipment, support staff and site/side marked as required. Patient was prepped and draped in the usual sterile fashion.      Clinical Data: No additional findings.   Subjective: Chief Complaint  Patient presents with   Left Hip - Follow-up  The patient is 6 weeks now status post a left total hip arthroplasty.  She is a very active 75 year old female and is already back to playing golf.  She is also requesting  a steroid injection in her left knee today because there is a golf tournament coming up again this week.  She denies any issues with her left hip at all.  She has a remote history of a right knee replacement.  She is walking without assistive device and says she has excellent range of motion and strength with the left operative hip.  She is not a diabetic.  There is been no acute change in her medical status.  HPI  Review of Systems There is currently listed no headache, chest pain, shortness of breath, fever, chills, nausea, vomiting  Objective: Vital Signs: There were no vitals taken for this visit.  Physical Exam She is alert and orient x3 and in no acute distress Ortho Exam Examination of her left hip shows that move smoothly and fluidly.  The left hip incision is healed nicely.  Her left knee does show some global tenderness especially on the medial joint line and some patellofemoral crepitation. Specialty Comments:  No specialty comments available.  Imaging: No results found.   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    Arthritis    Diverticulosis    Headache    occasionally   Hypertension    takes Lisinopril daily   Joint pain    Osteopenia    PONV (postoperative nausea and vomiting)    Slight nausea   Rosacea     Family History  Problem Relation Age  of Onset   Hypertension Mother    Heart disease Mother    Kidney disease Mother    Diabetes Father    Hypertension Father    Heart disease Father    Heart disease Brother    Cancer Maternal Aunt    Ovarian cancer Maternal Aunt 85   Stroke Brother    Alzheimer's disease Brother    Diabetes Paternal Grandmother    Breast cancer Neg Hx     Past Surgical History:  Procedure Laterality Date   CHOLECYSTECTOMY     CHOLECYSTECTOMY     COLONOSCOPY     COLONOSCOPY WITH PROPOFOL N/A 11/25/2019   Procedure: COLONOSCOPY WITH PROPOFOL;  Surgeon: Virgel Manifold, MD;  Location: [facility];  Service: Gastroenterology;  Laterality: N/A;   EYE SURGERY  2013   LASIK   REFRACTIVE SURGERY     SHOULDER ARTHROSCOPY WITH OPEN ROTATOR CUFF REPAIR AND DISTAL CLAVICLE ACROMINECTOMY Right 09/19/2014   Procedure: SHOULDER ARTHROSCOPY WITH MINI-OPEN ROTATOR CUFF REPAIR AND DISTAL CLAVICLE RESECTION, SUBACROMIAL DECOMPRESSION;  Surgeon: Garald Balding, MD;  Location: [facility];  Service: Orthopedics;  Laterality: Right;   TONSILLECTOMY  at age 69   TOTAL HIP ARTHROPLASTY Left 11/20/2020   Procedure: LEFT TOTAL HIP ARTHROPLASTY ANTERIOR APPROACH;  Surgeon: Mcarthur Rossetti, MD;  Location: [facility];  Service: Orthopedics;  Laterality: Left;  RNFA   TOTAL KNEE ARTHROPLASTY Right 03/18/2016   TOTAL KNEE ARTHROPLASTY Right 03/18/2016   Procedure: TOTAL KNEE ARTHROPLASTY;  Surgeon: Garald Balding, MD;  Location: [facility];  Service: Orthopedics;  Laterality: Right;   VAGINAL HYSTERECTOMY  at age 17   Social History   Occupational History   Not on file  Tobacco Use   Smoking status: Never   Smokeless tobacco: Never  Vaping Use   Vaping Use: Never used  Substance and Sexual Activity   Alcohol use: No    Alcohol/week: 0.0 standard drinks   Drug use: No   Sexual activity: Yes    Birth control/protection: Post-menopausal, Surgical

## 2021-01-08 ENCOUNTER — Telehealth: Payer: Self-pay | Admitting: *Deleted

## 2021-01-08 NOTE — Telephone Encounter (Signed)
Ortho bundle 30 day call and survey completed. ?

## 2021-01-08 NOTE — Telephone Encounter (Signed)
Attempted 30 day Ortho bundle call to patient. No answer and left VM.

## 2021-02-08 ENCOUNTER — Other Ambulatory Visit: Payer: Self-pay | Admitting: Family Medicine

## 2021-02-08 DIAGNOSIS — I10 Essential (primary) hypertension: Secondary | ICD-10-CM

## 2021-02-11 ENCOUNTER — Telehealth: Payer: Self-pay

## 2021-02-11 NOTE — Telephone Encounter (Signed)
Called to speak with Katherine Stanley and schedule for a follow up with PCP. Pt has not been seen within a year. 30 day supply of Lisinopril has been sent in (15 tablets with Sig to take 1/2 tablet by mouth once daily.)  Call was disconnected after 2nd ring and did not go to voicemail.

## 2021-02-19 ENCOUNTER — Telehealth: Payer: Self-pay | Admitting: *Deleted

## 2021-02-19 NOTE — Telephone Encounter (Signed)
Attempted 90 day call to patient. No answer and left VM requesting call back to check status.

## 2021-02-21 ENCOUNTER — Telehealth: Payer: Self-pay | Admitting: *Deleted

## 2021-02-21 NOTE — Telephone Encounter (Signed)
Ortho bundle 90 day call completed. 

## 2021-03-15 ENCOUNTER — Telehealth (INDEPENDENT_AMBULATORY_CARE_PROVIDER_SITE_OTHER): Payer: Medicare Other | Admitting: Family Medicine

## 2021-03-15 ENCOUNTER — Other Ambulatory Visit: Payer: Self-pay

## 2021-03-15 ENCOUNTER — Encounter: Payer: Self-pay | Admitting: Family Medicine

## 2021-03-15 DIAGNOSIS — R7303 Prediabetes: Secondary | ICD-10-CM

## 2021-03-15 DIAGNOSIS — I10 Essential (primary) hypertension: Secondary | ICD-10-CM | POA: Diagnosis not present

## 2021-03-15 DIAGNOSIS — Z96642 Presence of left artificial hip joint: Secondary | ICD-10-CM | POA: Diagnosis not present

## 2021-03-15 MED ORDER — LISINOPRIL 10 MG PO TABS: ORAL_TABLET | ORAL | 3 refills | Status: DC

## 2021-03-15 NOTE — Assessment & Plan Note (Signed)
She is doing quite well.  She will continue to monitor.

## 2021-03-15 NOTE — Assessment & Plan Note (Signed)
Adequate control.  She will continue lisinopril 5 mg once daily.  We will check lab work.

## 2021-03-15 NOTE — Assessment & Plan Note (Addendum)
Check A1c.  Encouraged continued exercise.  Discussed healthier diet.  Discussed potential for progression of this without dietary changes.

## 2021-03-15 NOTE — Progress Notes (Signed)
Virtual Visit via video Note  This visit type was conducted due to national recommendations for restrictions regarding the COVID-19 pandemic (e.g. social distancing).  This format is felt to be most appropriate for this patient at this time.  All issues noted in this document were discussed and addressed.  No physical exam was performed (except for noted visual exam findings with Video Visits).   I connected with Katherine Stanley today at  3:15 PM EST by a video enabled telemedicine application and verified that I am speaking with the correct person using two identifiers. Location patient: home Location provider: home office Persons participating in the virtual visit: patient, provider  I discussed the limitations, risks, security and privacy concerns of performing an evaluation and management service by telephone and the availability of in person appointments. I also discussed with the patient that there may be a patient responsible charge related to this service. The patient expressed understanding and agreed to proceed.   Reason for visit: f/u.  HPI: HYPERTENSION Disease Monitoring Home BP Monitoring 120s/70s Chest pain- no    Dyspnea- no Medications Compliance-  taking lisinopril.   Edema- no BMET    Component Value Date/Time   NA 134 (L) 11/21/2020 0205   K 3.9 11/21/2020 0205   CL 105 11/21/2020 0205   CO2 21 (L) 11/21/2020 0205   GLUCOSE 127 (H) 11/21/2020 0205   BUN 12 11/21/2020 0205   CREATININE 0.55 11/21/2020 0205   CALCIUM 8.7 (L) 11/21/2020 0205   GFRNONAA >60 11/21/2020 0205   GFRAA >60 03/20/2016 0432   Prediabetes: No polyuria or polydipsia.  She plays golf for exercise.  She generally eats whatever she wants.  She notes its not a healthy diet.  Not as many fruits and vegetables as she should.  Drinks a lot of water.  Status post hip replacement: Reports she is doing quite well.  No pain.  She continues to see orthopedics.   ROS: See pertinent positives and  negatives per HPI.  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    Arthritis    Diverticulosis    Headache    occasionally   Hypertension    takes Lisinopril daily   Joint pain    Osteopenia    PONV (postoperative nausea and vomiting)    Slight nausea   Rosacea     Past Surgical History:  Procedure Laterality Date   CHOLECYSTECTOMY     CHOLECYSTECTOMY     COLONOSCOPY     COLONOSCOPY WITH PROPOFOL N/A 11/25/2019   Procedure: COLONOSCOPY WITH PROPOFOL;  Surgeon: Virgel Manifold, MD;  Location: [facility];  Service: Gastroenterology;  Laterality: N/A;   EYE SURGERY  2013   LASIK   REFRACTIVE SURGERY     SHOULDER ARTHROSCOPY WITH OPEN ROTATOR CUFF REPAIR AND DISTAL CLAVICLE ACROMINECTOMY Right 09/19/2014   Procedure: SHOULDER ARTHROSCOPY WITH MINI-OPEN ROTATOR CUFF REPAIR AND DISTAL CLAVICLE RESECTION, SUBACROMIAL DECOMPRESSION;  Surgeon: Garald Balding, MD;  Location: [place];  Service: Orthopedics;  Laterality: Right;   TONSILLECTOMY  at age 89   TOTAL HIP ARTHROPLASTY Left 11/20/2020   Procedure: LEFT TOTAL HIP ARTHROPLASTY ANTERIOR APPROACH;  Surgeon: Mcarthur Rossetti, MD;  Location: [place];  Service: Orthopedics;  Laterality: Left;  RNFA   TOTAL KNEE ARTHROPLASTY Right 03/18/2016   TOTAL KNEE ARTHROPLASTY Right 03/18/2016   Procedure: TOTAL KNEE ARTHROPLASTY;  Surgeon: Garald Balding, MD;  Location: [place];  Service:  Orthopedics;  Laterality: Right;   VAGINAL HYSTERECTOMY  at age 27    Family History  Problem Relation Age of Onset   Hypertension Mother    Heart disease Mother    Kidney disease Mother    Diabetes Father    Hypertension Father    Heart disease Father    Heart disease Brother    Cancer Maternal Aunt    Ovarian cancer Maternal Aunt 85   Stroke Brother    Alzheimer's disease Brother    Diabetes Paternal Grandmother    Breast cancer Neg Hx     SOCIAL HX: Non-smoker   Current  Outpatient Medications:    acetaminophen (TYLENOL) 650 MG CR tablet, Take 1,300 mg by mouth every 8 (eight) hours as needed for pain., Disp: , Rfl:    Carboxymethylcellulose Sodium (DRY EYE RELIEF OP), Place 1 drop into both eyes daily., Disp: , Rfl:    cholecalciferol (VITAMIN D) 1000 units tablet, Take 1,000 Units by mouth daily., Disp: , Rfl:    Coenzyme Q10 (COQ-10) 100 MG CAPS, Take 100 mg by mouth daily., Disp: , Rfl:    diclofenac Sodium (VOLTAREN) 1 % GEL, Apply 1 application topically 4 (four) times daily as needed (pain)., Disp: , Rfl:    ferrous gluconate (FERGON) 240 (27 FE) MG tablet, Take 240 mg by mouth daily., Disp: , Rfl:    Krill Oil 350 MG CAPS, Take 350 mg by mouth daily., Disp: , Rfl:    Lysine 500 MG CAPS, Take 500 mg by mouth daily as needed (fever blisters)., Disp: , Rfl:    methocarbamol (ROBAXIN) 500 MG tablet, Take 1 tablet (500 mg total) by mouth every 6 (six) hours as needed for muscle spasms., Disp: 30 tablet, Rfl: 1   Multiple Vitamins-Minerals (EYE VITAMINS PO), Take 1 tablet by mouth daily., Disp: , Rfl:    OVER THE COUNTER MEDICATION, Take 1 tablet by mouth daily. Beet root supplement, Disp: , Rfl:    Plant Sterols and Stanols (CHOLEST OFF PO), Take 1 tablet by mouth daily., Disp: , Rfl:    Potassium 99 MG TABS, Take 99 mg by mouth daily., Disp: , Rfl:    vitamin B-12 (CYANOCOBALAMIN) 1000 MCG tablet, Take 1,000 mcg by mouth daily., Disp: , Rfl:    lisinopril (ZESTRIL) 10 MG tablet, Take 1/2 (one-half) tablet by mouth once daily, Disp: 45 tablet, Rfl: 3  EXAM:  VITALS per patient if applicable:  GENERAL: alert, oriented, appears well and in no acute distress  HEENT: atraumatic, conjunttiva clear, no obvious abnormalities on inspection of external nose and ears  NECK: normal movements of the head and neck  LUNGS: on inspection no signs of respiratory distress, breathing rate appears normal, no obvious gross SOB, gasping or wheezing  CV: no obvious  cyanosis  MS: moves all visible extremities without noticeable abnormality  PSYCH/NEURO: pleasant and cooperative, no obvious depression or anxiety, speech and thought processing grossly intact  ASSESSMENT AND PLAN:  Discussed the following assessment and plan:  Problem List Items Addressed This Visit     Hypertension    Adequate control.  She will continue lisinopril 5 mg once daily.  We will check lab work.      Relevant Medications   lisinopril (ZESTRIL) 10 MG tablet   Prediabetes    Check A1c.  Encouraged continued exercise.  Discussed healthier diet.  Discussed potential for progression of this without dietary changes.      Relevant Orders   HgB A1c  Status post total replacement of left hip    She is doing quite well.  She will continue to monitor.      Other Visit Diagnoses     Essential hypertension       Relevant Medications   lisinopril (ZESTRIL) 10 MG tablet   Other Relevant Orders   Comp Met (CMET)   Lipid panel       Return in about 1 week (around 03/22/2021) for Labs, 6 months PCP for hypertension.   I discussed the assessment and treatment plan with the patient. The patient was provided an opportunity to ask questions and all were answered. The patient agreed with the plan and demonstrated an understanding of the instructions.   The patient was advised to call back or seek an in-person evaluation if the symptoms worsen or if the condition fails to improve as anticipated.   [name], MD

## 2021-03-18 ENCOUNTER — Ambulatory Visit (INDEPENDENT_AMBULATORY_CARE_PROVIDER_SITE_OTHER): Payer: Medicare Other

## 2021-03-18 ENCOUNTER — Other Ambulatory Visit: Payer: Self-pay

## 2021-03-18 DIAGNOSIS — Z23 Encounter for immunization: Secondary | ICD-10-CM | POA: Diagnosis not present

## 2021-03-19 ENCOUNTER — Encounter: Payer: Self-pay | Admitting: Dermatology

## 2021-03-19 DIAGNOSIS — L219 Seborrheic dermatitis, unspecified: Secondary | ICD-10-CM

## 2021-03-19 DIAGNOSIS — Z20822 Contact with and (suspected) exposure to covid-19: Secondary | ICD-10-CM | POA: Diagnosis not present

## 2021-03-20 MED ORDER — FLUOCINOLONE ACETONIDE BODY 0.01 % EX OIL
1.0000 | TOPICAL_OIL | Freq: Two times a day (BID) | CUTANEOUS | 1 refills | Status: AC
Start: 2021-03-20 — End: 2022-03-20

## 2021-03-28 ENCOUNTER — Telehealth: Payer: Self-pay | Admitting: Family Medicine

## 2021-03-28 DIAGNOSIS — Z780 Asymptomatic menopausal state: Secondary | ICD-10-CM

## 2021-03-28 NOTE — Telephone Encounter (Signed)
Pt called in requesting referral for [facility] for bone density check. Pt requesting callback stating that the referral was put through.

## 2021-03-29 NOTE — Telephone Encounter (Signed)
Bone density scan ordered.  She can call to schedule this.

## 2021-04-02 NOTE — Telephone Encounter (Signed)
I called the patient and informed her that the bone density was ordered and she could call and schedule and she understood.  Hammond Obeirne,cma

## 2021-04-19 ENCOUNTER — Other Ambulatory Visit: Payer: Self-pay

## 2021-04-19 ENCOUNTER — Other Ambulatory Visit (INDEPENDENT_AMBULATORY_CARE_PROVIDER_SITE_OTHER): Payer: Medicare Other

## 2021-04-19 DIAGNOSIS — R7303 Prediabetes: Secondary | ICD-10-CM

## 2021-04-19 DIAGNOSIS — I10 Essential (primary) hypertension: Secondary | ICD-10-CM | POA: Diagnosis not present

## 2021-04-19 LAB — COMPREHENSIVE METABOLIC PANEL
ALT: 16 U/L (ref 0–35)
AST: 16 U/L (ref 0–37)
Albumin: 4.3 g/dL (ref 3.5–5.2)
Alkaline Phosphatase: 66 U/L (ref 39–117)
BUN: 19 mg/dL (ref 6–23)
CO2: 25 mEq/L (ref 19–32)
Calcium: 9.8 mg/dL (ref 8.4–10.5)
Chloride: 104 mEq/L (ref 96–112)
Creatinine, Ser: 0.65 mg/dL (ref 0.40–1.20)
GFR: 86.86 mL/min (ref >60.00)
Glucose, Bld: 112 mg/dL — ABNORMAL HIGH (ref 70–99)
Potassium: 4.2 mEq/L (ref 3.5–5.1)
Sodium: 137 mEq/L (ref 135–145)
Total Bilirubin: 0.6 mg/dL (ref 0.2–1.2)
Total Protein: 6.8 g/dL (ref 6.0–8.3)

## 2021-04-19 LAB — LIPID PANEL
Cholesterol: 159 mg/dL (ref 0–200)
HDL: 47.9 mg/dL (ref >39.00)
LDL Cholesterol: 80 mg/dL (ref 0–99)
NonHDL: 110.78
Total CHOL/HDL Ratio: 3
Triglycerides: 156 mg/dL — ABNORMAL HIGH (ref 0.0–149.0)
VLDL: 31.2 mg/dL (ref 0.0–40.0)

## 2021-04-19 LAB — HEMOGLOBIN A1C: Hgb A1c MFr Bld: 6.4 % (ref 4.6–6.5)

## 2021-05-09 ENCOUNTER — Ambulatory Visit
Admission: RE | Admit: 2021-05-09 | Discharge: 2021-05-09 | Disposition: A | Discharge status: Home / Self Care | Payer: Medicare Other | Source: Ambulatory Visit | Attending: Family Medicine | Admitting: Family Medicine

## 2021-05-09 ENCOUNTER — Other Ambulatory Visit: Payer: Self-pay

## 2021-05-09 DIAGNOSIS — M8589 Other specified disorders of bone density and structure, multiple sites: Secondary | ICD-10-CM | POA: Diagnosis not present

## 2021-05-09 DIAGNOSIS — Z780 Asymptomatic menopausal state: Secondary | ICD-10-CM | POA: Diagnosis not present

## 2021-05-13 ENCOUNTER — Encounter: Payer: Self-pay | Admitting: Family Medicine

## 2021-05-14 ENCOUNTER — Telehealth: Payer: Self-pay

## 2021-05-14 ENCOUNTER — Telehealth (INDEPENDENT_AMBULATORY_CARE_PROVIDER_SITE_OTHER): Payer: Medicare Other | Admitting: Family

## 2021-05-14 ENCOUNTER — Encounter: Payer: Self-pay | Admitting: Family

## 2021-05-14 VITALS — Temp 98.6°F | Ht 61.0 in

## 2021-05-14 DIAGNOSIS — U071 COVID-19: Secondary | ICD-10-CM | POA: Diagnosis not present

## 2021-05-14 DIAGNOSIS — R051 Acute cough: Secondary | ICD-10-CM | POA: Diagnosis not present

## 2021-05-14 MED ORDER — NIRMATRELVIR/RITONAVIR (PAXLOVID)TABLET: 3.0000 | ORAL_TABLET | Freq: Two times a day (BID) | ORAL | 0 refills | Status: AC

## 2021-05-14 MED ORDER — PROMETHAZINE-DM 6.25-15 MG/5ML PO SYRP: 5.0000 mL | ORAL_SOLUTION | Freq: Every evening | ORAL | 0 refills | Status: DC | PRN

## 2021-05-14 NOTE — Telephone Encounter (Signed)
I called and spoke with the patient and she has a tele visit today with a provider for her positive covid result.  Joselyne Spake,cma

## 2021-05-14 NOTE — Telephone Encounter (Signed)
Noted  

## 2021-05-14 NOTE — Progress Notes (Signed)
Virtual Visit via Video   I connected with patient on 05/14/21 at  1:00 PM EST by a video enabled telemedicine application and verified that I am speaking with the correct person using two identifiers.  Location patient: Home Location provider: [employer], Office Persons participating in the virtual visit: Patient, Provider, CMA  I discussed the limitations of evaluation and management by telemedicine and the availability of in person appointments. The patient expressed understanding and agreed to proceed.  Subjective:   HPI:   76 year old female presents with c/o a positive rapid antigen COVID-19 test that she took yesterday. She has symptoms of cough, sinus pressure, congestion x 3 days. Husband is positive for COVID-19 as well. No SOB. No fever or chills.   ROS:   See pertinent positives and negatives per HPI.  Patient Active Problem List   Diagnosis Date Noted   Status post total replacement of left hip 11/20/2020   Unilateral primary osteoarthritis, left hip 08/08/2020   Unilateral primary osteoarthritis, left knee 09/13/2019   Discomfort of back 04/20/2019   Prediabetes 03/19/2018   Unilateral primary osteoarthritis, right knee 03/18/2016   S/P total knee replacement using cement, right 03/18/2016   Seborrheic keratoses 08/28/2014   Osteopenia 08/23/2012   Postmenopausal estrogen deficiency 07/19/2012   Hypertension 10/27/2011   Rosacea     Social History   Tobacco Use   Smoking status: Never   Smokeless tobacco: Never  Substance Use Topics   Alcohol use: No    Alcohol/week: 0.0 standard drinks    Current Outpatient Medications:    acetaminophen (TYLENOL) 650 MG CR tablet, Take 1,300 mg by mouth every 8 (eight) hours as needed for pain., Disp: , Rfl:    Carboxymethylcellulose Sodium (DRY EYE RELIEF OP), Place 1 drop into both eyes daily., Disp: , Rfl:    cholecalciferol (VITAMIN D) 1000 units tablet, Take 1,000 Units by mouth daily., Disp: ,  Rfl:    Coenzyme Q10 (COQ-10) 100 MG CAPS, Take 100 mg by mouth daily., Disp: , Rfl:    diclofenac Sodium (VOLTAREN) 1 % GEL, Apply 1 application topically 4 (four) times daily as needed (pain)., Disp: , Rfl:    ferrous gluconate (FERGON) 240 (27 FE) MG tablet, Take 240 mg by mouth daily., Disp: , Rfl:    Fluocinolone Acetonide Body 0.01 % OIL, Apply 1 application topically 2 (two) times daily. Apply 1-2 drops to aa's ears QD-BID PRN., Disp: 120 mL, Rfl: 1   Krill Oil 350 MG CAPS, Take 350 mg by mouth daily., Disp: , Rfl:    lisinopril (ZESTRIL) 10 MG tablet, Take 1/2 (one-half) tablet by mouth once daily, Disp: 45 tablet, Rfl: 3   Lysine 500 MG CAPS, Take 500 mg by mouth daily as needed (fever blisters)., Disp: , Rfl:    methocarbamol (ROBAXIN) 500 MG tablet, Take 1 tablet (500 mg total) by mouth every 6 (six) hours as needed for muscle spasms., Disp: 30 tablet, Rfl: 1   Multiple Vitamins-Minerals (EYE VITAMINS PO), Take 1 tablet by mouth daily., Disp: , Rfl:    nirmatrelvir/ritonavir EUA (PAXLOVID) 20 x 150 MG & 10 x 100MG  TABS, Take 3 tablets by mouth 2 (two) times daily for 5 days. (Take nirmatrelvir 150 mg two tablets twice daily for 5 days and ritonavir 100 mg one tablet twice daily for 5 days) Patient GFR is >60, Disp: 30 tablet, Rfl: 0   OVER THE COUNTER MEDICATION, Take 1 tablet by mouth daily. Beet root supplement, Disp: ,  Rfl:    Plant Sterols and Stanols (CHOLEST OFF PO), Take 1 tablet by mouth daily., Disp: , Rfl:    Potassium 99 MG TABS, Take 99 mg by mouth daily., Disp: , Rfl:    promethazine-dextromethorphan (PROMETHAZINE-DM) 6.25-15 MG/5ML syrup, Take 5 mLs by mouth at bedtime as needed for cough., Disp: 118 mL, Rfl: 0   vitamin B-12 (CYANOCOBALAMIN) 1000 MCG tablet, Take 1,000 mcg by mouth daily., Disp: , Rfl:   Allergies  Allergen Reactions   Alendronate Other (See Comments)    Diffuse myalgia and arthralgia   Aleve [Naproxen Sodium] Nausea And Vomiting    Sick to stomach     Objective:   Temp 98.6 F (37 C)    Ht 5\' 1"  (1.549 m)    BMI 31.55 kg/m   Patient is well-developed, well-nourished in no acute distress.  Resting comfortably at home.  Head is normocephalic, atraumatic.  No labored breathing.  Speech is clear and coherent with logical content.  Patient is alert and oriented at baseline.    Assessment and Plan:   Sophie was seen today for covid positive, headache, chills and sinusitis.  Diagnoses and all orders for this visit:  COVID-19  Acute cough  Other orders -     nirmatrelvir/ritonavir EUA (PAXLOVID) 20 x 150 MG & 10 x 100MG  TABS; Take 3 tablets by mouth 2 (two) times daily for 5 days. (Take nirmatrelvir 150 mg two tablets twice daily for 5 days and ritonavir 100 mg one tablet twice daily for 5 days) Patient GFR is >60 -     promethazine-dextromethorphan (PROMETHAZINE-DM) 6.25-15 MG/5ML syrup; Take 5 mLs by mouth at bedtime as needed for cough.   Call the office if symptoms worsen or persist. Recheck as scheduled and sooner as needed/ COVID-19 precautions given.    Kennyth Arnold, FNP 05/14/2021

## 2021-05-24 DIAGNOSIS — H2512 Age-related nuclear cataract, left eye: Secondary | ICD-10-CM | POA: Diagnosis not present

## 2021-05-24 DIAGNOSIS — H52223 Regular astigmatism, bilateral: Secondary | ICD-10-CM | POA: Diagnosis not present

## 2021-05-27 DIAGNOSIS — Z20822 Contact with and (suspected) exposure to covid-19: Secondary | ICD-10-CM | POA: Diagnosis not present

## 2021-06-03 DIAGNOSIS — H2511 Age-related nuclear cataract, right eye: Secondary | ICD-10-CM | POA: Diagnosis not present

## 2021-06-13 DIAGNOSIS — H52223 Regular astigmatism, bilateral: Secondary | ICD-10-CM | POA: Diagnosis not present

## 2021-06-25 ENCOUNTER — Encounter: Payer: Self-pay | Admitting: Ophthalmology

## 2021-06-25 DIAGNOSIS — Z20822 Contact with and (suspected) exposure to covid-19: Secondary | ICD-10-CM | POA: Diagnosis not present

## 2021-06-26 NOTE — Discharge Instructions (Signed)

## 2021-07-01 ENCOUNTER — Ambulatory Visit: Payer: Medicare Other | Admitting: Dermatology

## 2021-07-01 ENCOUNTER — Ambulatory Visit: Payer: Medicare Other | Admitting: Orthopaedic Surgery

## 2021-07-02 ENCOUNTER — Ambulatory Visit: Payer: Medicare Other | Admitting: Anesthesiology

## 2021-07-02 ENCOUNTER — Encounter
Admission: RE | Disposition: A | Discharge status: Home / Self Care | Payer: Self-pay | Source: Ambulatory Visit | Attending: Ophthalmology

## 2021-07-02 ENCOUNTER — Ambulatory Visit
Admission: RE | Admit: 2021-07-02 | Discharge: 2021-07-02 | Disposition: A | Discharge status: Home / Self Care | Payer: Medicare Other | Source: Ambulatory Visit | Attending: Ophthalmology | Admitting: Ophthalmology

## 2021-07-02 ENCOUNTER — Encounter: Payer: Self-pay | Admitting: Ophthalmology

## 2021-07-02 ENCOUNTER — Other Ambulatory Visit: Payer: Self-pay

## 2021-07-02 DIAGNOSIS — H2512 Age-related nuclear cataract, left eye: Secondary | ICD-10-CM | POA: Insufficient documentation

## 2021-07-02 DIAGNOSIS — H25812 Combined forms of age-related cataract, left eye: Secondary | ICD-10-CM | POA: Diagnosis not present

## 2021-07-02 DIAGNOSIS — I10 Essential (primary) hypertension: Secondary | ICD-10-CM | POA: Insufficient documentation

## 2021-07-02 HISTORY — PX: CATARACT EXTRACTION W/PHACO: SHX586

## 2021-07-02 SURGERY — PHACOEMULSIFICATION, CATARACT, WITH IOL INSERTION
Anesthesia: Monitor Anesthesia Care | Site: Eye | Laterality: Left

## 2021-07-02 MED ORDER — ONDANSETRON HCL 4 MG/2ML IJ SOLN: 4.0000 mg | Freq: Once | INTRAMUSCULAR | Status: DC | PRN

## 2021-07-02 MED ORDER — ACETAMINOPHEN 325 MG PO TABS: 325.0000 mg | ORAL_TABLET | ORAL | Status: DC | PRN

## 2021-07-02 MED ORDER — ACETAMINOPHEN 160 MG/5ML PO SOLN: 325.0000 mg | ORAL | Status: DC | PRN

## 2021-07-02 MED ORDER — FENTANYL CITRATE (PF) 100 MCG/2ML IJ SOLN
INTRAMUSCULAR | Status: DC | PRN
  Administered 2021-07-02: 50 ug via INTRAVENOUS

## 2021-07-02 MED ORDER — MOXIFLOXACIN HCL 0.5 % OP SOLN
OPHTHALMIC | Status: DC | PRN
  Administered 2021-07-02: 0.2 mL via OPHTHALMIC

## 2021-07-02 MED ORDER — TETRACAINE HCL 0.5 % OP SOLN
1.0000 [drp] | OPHTHALMIC | Status: DC | PRN
  Administered 2021-07-02 (×3): 1 [drp] via OPHTHALMIC

## 2021-07-02 MED ORDER — MIDAZOLAM HCL 2 MG/2ML IJ SOLN
INTRAMUSCULAR | Status: DC | PRN
  Administered 2021-07-02: 1 mg via INTRAVENOUS

## 2021-07-02 MED ORDER — SIGHTPATH DOSE#1 BSS IO SOLN
INTRAOCULAR | Status: DC | PRN
  Administered 2021-07-02: 15 mL via INTRAOCULAR

## 2021-07-02 MED ORDER — BRIMONIDINE TARTRATE-TIMOLOL 0.2-0.5 % OP SOLN
OPHTHALMIC | Status: DC | PRN
Start: 2021-07-02 — End: 2021-07-02
  Administered 2021-07-02: 1 [drp] via OPHTHALMIC

## 2021-07-02 MED ORDER — SIGHTPATH DOSE#1 NA CHONDROIT SULF-NA HYALURON 40-17 MG/ML IO SOLN
INTRAOCULAR | Status: DC | PRN
  Administered 2021-07-02: 1 mL via INTRAOCULAR

## 2021-07-02 MED ORDER — SIGHTPATH DOSE#1 BSS IO SOLN
INTRAOCULAR | Status: DC | PRN
  Administered 2021-07-02: 2 mL

## 2021-07-02 MED ORDER — SIGHTPATH DOSE#1 BSS IO SOLN
INTRAOCULAR | Status: DC | PRN
  Administered 2021-07-02: 62 mL via OPHTHALMIC

## 2021-07-02 MED ORDER — ARMC OPHTHALMIC DILATING DROPS
1.0000 "application " | OPHTHALMIC | Status: DC | PRN
  Administered 2021-07-02 (×3): 1 via OPHTHALMIC

## 2021-07-02 SURGICAL SUPPLY — 12 items
CANNULA ANT/CHMB 27G (MISCELLANEOUS) IMPLANT
CANNULA ANT/CHMB 27GA (MISCELLANEOUS) IMPLANT
CATARACT SUITE SIGHTPATH (MISCELLANEOUS) ×2 IMPLANT
FEE CATARACT SUITE SIGHTPATH (MISCELLANEOUS) ×2 IMPLANT
GLOVE SURG ENC TEXT LTX SZ8 (GLOVE) ×3 IMPLANT
GLOVE SURG TRIUMPH 8.0 PF LTX (GLOVE) ×3 IMPLANT
LENS IOL TECNIS EYHANCE 19.5 (Intraocular Lens) ×1 IMPLANT
NDL FILTER BLUNT 18X1 1/2 (NEEDLE) ×2 IMPLANT
NEEDLE FILTER BLUNT 18X 1/2SAF (NEEDLE) ×1
NEEDLE FILTER BLUNT 18X1 1/2 (NEEDLE) ×1 IMPLANT
SYR 3ML LL SCALE MARK (SYRINGE) ×3 IMPLANT
WATER STERILE IRR 250ML POUR (IV SOLUTION) ×3 IMPLANT

## 2021-07-02 NOTE — Anesthesia Procedure Notes (Signed)
Procedure Name: Joseph ?Date/Time: 07/02/2021 7:57 AM ?Performed by: Dionne Bucy, CRNA ?Pre-anesthesia Checklist: Patient identified, Emergency Drugs available, Suction available, Patient being monitored and Timeout performed ?Patient Re-evaluated:Patient Re-evaluated prior to induction ?Oxygen Delivery Method: Nasal cannula ?Placement Confirmation: positive ETCO2 ? ? ? ? ?

## 2021-07-02 NOTE — Op Note (Signed)
PREOPERATIVE DIAGNOSIS:  Nuclear sclerotic cataract of the left eye. ?  ?POSTOPERATIVE DIAGNOSIS:  Nuclear sclerotic cataract of the left eye. ?  ?OPERATIVE PROCEDURE:ORPROCALL@ ?  ?SURGEON:  Birder Robson, MD. ?  ?ANESTHESIA: ? ?Anesthesiologist: Veda Canning, MD ?CRNA: Dionne Bucy, CRNA ? ?1.      Managed anesthesia care. ?2.     0.104m of Shugarcaine was instilled following the paracentesis ?  ?COMPLICATIONS:  None. ?  ?TECHNIQUE:   Stop and chop ?  ?DESCRIPTION OF PROCEDURE:  The patient was examined and consented in the preoperative holding area where the aforementioned topical anesthesia was applied to the left eye and then brought back to the Operating Room where the left eye was prepped and draped in the usual sterile ophthalmic fashion and a lid speculum was placed. A paracentesis was created with the side port blade and the anterior chamber was filled with viscoelastic. A near clear corneal incision was performed with the steel keratome. A continuous curvilinear capsulorrhexis was performed with a cystotome followed by the capsulorrhexis forceps. Hydrodissection and hydrodelineation were carried out with BSS on a blunt cannula. The lens was removed in a stop and chop  technique and the remaining cortical material was removed with the irrigation-aspiration handpiece. The capsular bag was inflated with viscoelastic and the Technis [id number] lens was placed in the capsular bag without complication. The remaining viscoelastic was removed from the eye with the irrigation-aspiration handpiece. The wounds were hydrated. The anterior chamber was flushed with BSS and the eye was inflated to physiologic pressure. 0.11mVigamox was placed in the anterior chamber. The wounds were found to be water tight. The eye was dressed with Combigan. The patient was given protective glasses to wear throughout the day and a shield with which to sleep tonight. The patient was also given drops with which to begin a drop regimen  today and will follow-up with me in one day. ?Implant Name Type Inv. Item Serial No. Manufacturer Lot No. LRB No. Used Action  ?LENS IOL TECNIS EYHANCE 19.5 - [id number] Lens LENS IOL TECNIS EYHANCE 19.5 [id number]  Left 1 Implanted  ?  ?Procedure(s): ?CATARACT EXTRACTION PHACO AND INTRAOCULAR LENS PLACEMENT (IOC) LEFT EYHANCE 6.84 00:46.3 (Left) ? ?Electronically signed: WiBirder Robson/21/2023 8:15 AM ? ?

## 2021-07-02 NOTE — Anesthesia Postprocedure Evaluation (Signed)
Anesthesia Post Note ? ?Patient: Katherine Stanley ? ?Procedure(s) Performed: CATARACT EXTRACTION PHACO AND INTRAOCULAR LENS PLACEMENT (IOC) LEFT EYHANCE 6.84 00:46.3 (Left: Eye) ? ? ?  ?Patient location during evaluation: PACU ?Anesthesia Type: MAC ?Level of consciousness: awake ?Pain management: pain level controlled ?Vital Signs Assessment: post-procedure vital signs reviewed and stable ?Respiratory status: respiratory function stable ?Cardiovascular status: stable ?Postop Assessment: no apparent nausea or vomiting ?Anesthetic complications: no ? ? ?No notable events documented. ? ?Veda Canning ? ? ? ? ? ?

## 2021-07-02 NOTE — Transfer of Care (Signed)
Immediate Anesthesia Transfer of Care Note ? ?Patient: Katherine Stanley ? ?Procedure(s) Performed: CATARACT EXTRACTION PHACO AND INTRAOCULAR LENS PLACEMENT (IOC) LEFT EYHANCE 6.84 00:46.3 (Left: Eye) ? ?Patient Location: PACU ? ?Anesthesia Type: MAC ? ?Level of Consciousness: awake, alert  and patient cooperative ? ?Airway and Oxygen Therapy: Patient Spontanous Breathing and Patient connected to supplemental oxygen ? ?Post-op Assessment: Post-op Vital signs reviewed, Patient's Cardiovascular Status Stable, Respiratory Function Stable, Patent Airway and No signs of Nausea or vomiting ? ?Post-op Vital Signs: Reviewed and stable ? ?Complications: No notable events documented. ? ?

## 2021-07-02 NOTE — H&P (Signed)
[place]  ? ?Primary Care Physician:  [name], MD ?Ophthalmologist: Dr. George Ina ? ?Pre-Procedure History & Physical: ?HPI:  Katherine Stanley is a 76 y.o. female here for cataract surgery. ?  ?Past Medical History:  ?Diagnosis Date  ? 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   ? Arthritis   ? Diverticulosis   ? Headache   ? occasionally  ? Hypertension   ? takes Lisinopril daily  ? Joint pain   ? Osteopenia   ? PONV (postoperative nausea and vomiting)   ? Slight nausea  ? Rosacea   ? ? ?Past Surgical History:  ?Procedure Laterality Date  ? CHOLECYSTECTOMY    ? CHOLECYSTECTOMY    ? COLONOSCOPY    ? COLONOSCOPY WITH PROPOFOL N/A 11/25/2019  ? Procedure: COLONOSCOPY WITH PROPOFOL;  Surgeon: Virgel Manifold, MD;  Location: [facility];  Service: Gastroenterology;  Laterality: N/A;  ? EYE SURGERY  2013  ? LASIK  ? REFRACTIVE SURGERY    ? SHOULDER ARTHROSCOPY WITH OPEN ROTATOR CUFF REPAIR AND DISTAL CLAVICLE ACROMINECTOMY Right 09/19/2014  ? Procedure: SHOULDER ARTHROSCOPY WITH MINI-OPEN ROTATOR CUFF REPAIR AND DISTAL CLAVICLE RESECTION, SUBACROMIAL DECOMPRESSION;  Surgeon: Garald Balding, MD;  Location: [place];  Service: Orthopedics;  Laterality: Right;  ? TONSILLECTOMY  at age 9  ? TOTAL HIP ARTHROPLASTY Left 11/20/2020  ? Procedure: LEFT TOTAL HIP ARTHROPLASTY ANTERIOR APPROACH;  Surgeon: Mcarthur Rossetti, MD;  Location: [place];  Service: Orthopedics;  Laterality: Left;  RNFA  ? TOTAL KNEE ARTHROPLASTY Right 03/18/2016  ? TOTAL KNEE ARTHROPLASTY Right 03/18/2016  ? Procedure: TOTAL KNEE ARTHROPLASTY;  Surgeon: Garald Balding, MD;  Location: [place];  Service: Orthopedics;  Laterality: Right;  ? VAGINAL HYSTERECTOMY  at age 83  ? ? ?Prior to Admission medications   ?Medication Sig Start Date End Date Taking? Authorizing Provider  ?acetaminophen (TYLENOL) 650 MG CR tablet Take 1,300 mg by mouth every 8 (eight) hours as needed for pain.   Yes [provider]   ?Calcium 250 MG CAPS Take by mouth daily.   Yes [provider]  ?Carboxymethylcellulose Sodium (DRY EYE RELIEF OP) Place 1 drop into both eyes daily.   Yes [provider]  ?cholecalciferol (VITAMIN D) 1000 units tablet Take 1,000 Units by mouth daily.   Yes [provider]  ?Coenzyme Q10 (COQ-10) 100 MG CAPS Take 100 mg by mouth daily.   Yes [provider]  ?diclofenac Sodium (VOLTAREN) 1 % GEL Apply 1 application topically 4 (four) times daily as needed (pain).   Yes [provider]  ?ferrous gluconate (FERGON) 240 (27 FE) MG tablet Take 240 mg by mouth daily.   Yes [provider]  ?Fluocinolone Acetonide Body 0.01 % OIL Apply 1 application topically 2 (two) times daily. Apply 1-2 drops to aa's ears QD-BID PRN. 03/20/21 03/20/22 Yes Ralene Bathe, MD  ?Astrid Drafts 350 MG CAPS Take 350 mg by mouth daily.   Yes [provider]  ?lisinopril (ZESTRIL) 10 MG tablet Take 1/2 (one-half) tablet by mouth once daily 03/15/21  Yes [name], MD  ?Multiple Vitamins-Minerals (EYE VITAMINS PO) Take 1 tablet by mouth daily.   Yes [provider]  ?OVER THE COUNTER MEDICATION Take 1 tablet by mouth daily. Beet root supplement   Yes [provider]  ?Plant Sterols and Stanols (CHOLEST OFF PO) Take 1 tablet by mouth daily.   Yes [provider]  ?Potassium 99 MG TABS  Take 99 mg by mouth daily.   Yes [provider]  ?Probiotic Product (PROBIOTIC DAILY PO) Take by mouth.   Yes [provider]  ?vitamin B-12 (CYANOCOBALAMIN) 1000 MCG tablet Take 1,000 mcg by mouth daily.   Yes [provider]  ?vitamin k 100 MCG tablet Take 100 mcg by mouth daily.   Yes [provider]  ?Lysine 500 MG CAPS Take 500 mg by mouth daily as needed (fever blisters).    [provider]  ?methocarbamol (ROBAXIN) 500 MG tablet Take 1 tablet (500 mg total) by mouth every 6 (six) hours as needed for muscle  spasms. ?Patient not taking: Reported on 06/25/2021 11/21/20   Mcarthur Rossetti, MD  ? ? ?Allergies as of 05/27/2021 - Review Complete 05/14/2021  ?Allergen Reaction Noted  ? Alendronate Other (See Comments) 10/01/2012  ? Aleve [naproxen sodium] Nausea And Vomiting 09/07/2014  ? ? ?Family History  ?Problem Relation Age of Onset  ? Hypertension Mother   ? Heart disease Mother   ? Kidney disease Mother   ? Diabetes Father   ? Hypertension Father   ? Heart disease Father   ? Heart disease Brother   ? Cancer Maternal Aunt   ? Ovarian cancer Maternal Aunt 85  ? Stroke Brother   ? Alzheimer's disease Brother   ? Diabetes Paternal Grandmother   ? Breast cancer Neg Hx   ? ? ?Social History  ? ?Socioeconomic History  ? Marital status: Married  ?  Spouse name: Not on file  ? Number of children: Not on file  ? Years of education: Not on file  ? Highest education level: Not on file  ?Occupational History  ? Not on file  ?Tobacco Use  ? Smoking status: Never  ? Smokeless tobacco: Never  ?Vaping Use  ? Vaping Use: Never used  ?Substance and Sexual Activity  ? Alcohol use: No  ?  Alcohol/week: 0.0 standard drinks  ? Drug use: No  ? Sexual activity: Yes  ?  Birth control/protection: Post-menopausal, Surgical  ?Other Topics Concern  ? Not on file  ?Social History Narrative  ? Lives in [place] with husband. No children. No pets.  ?   ? Work - retired Sales promotion account executive  ? Diet - regular  ? Exercise - golf, gardening  ? ?Social Determinants of Health  ? ?Financial Resource Strain: Low Risk   ? Difficulty of Paying Living Expenses: Not hard at all  ?Food Insecurity: No Food Insecurity  ? Worried About Charity fundraiser in the Last Year: Never true  ? Ran Out of Food in the Last Year: Never true  ?Transportation Needs: No Transportation Needs  ? Lack of Transportation (Medical): No  ? Lack of Transportation (Non-Medical): No  ?Physical Activity: Sufficiently Active  ? Days of Exercise per Week: 7 days  ? Minutes of Exercise  per Session: 30 min  ?Stress: No Stress Concern Present  ? Feeling of Stress : Not at all  ?Social Connections: Not on file  ?Intimate Partner Violence: Not At Risk  ? Fear of Current or Ex-Partner: No  ? Emotionally Abused: No  ? Physically Abused: No  ? Sexually Abused: No  ? ? ?Review of Systems: ?See HPI, otherwise negative ROS ? ?Physical Exam: ?BP (!) 163/94   Pulse (!) 107   Temp 97.9 ?F (36.6 ?C) (Temporal)   Resp 16   Ht '5\' 1"'$  (1.549 m)   Wt 75.8 kg   SpO2 93%   BMI  31.55 kg/m?  ?General:   Alert, cooperative in NAD ?Head:  Normocephalic and atraumatic. ?Respiratory:  Normal work of breathing. ?Cardiovascular:  RRR ? ?Impression/Plan: ?Katherine Stanley is here for cataract surgery. ? ?Risks, benefits, limitations, and alternatives regarding cataract surgery have been reviewed with the patient.  Questions have been answered.  All parties agreeable. ? ? ?Birder Robson, MD  07/02/2021, 7:51 AM ? ? ?

## 2021-07-02 NOTE — Anesthesia Preprocedure Evaluation (Signed)
Anesthesia Evaluation  ?Patient identified by MRN, date of birth, ID band ?Patient awake ? ? ? ?Reviewed: ?Allergy & Precautions, NPO status  ? ?History of Anesthesia Complications ?(+) PONV and history of anesthetic complications ? ?Airway ?Mallampati: II ? ?TM Distance: >3 FB ? ? ? ? Dental ?  ?Pulmonary ? ?  ?Pulmonary exam normal ? ? ? ? ? ? ? Cardiovascular ?hypertension,  ?Rhythm:Regular Rate:Normal ? ? ?  ?Neuro/Psych ? Headaches,   ? GI/Hepatic ?  ?Endo/Other  ? ? Renal/GU ?  ? ?  ?Musculoskeletal ? ?(+) Arthritis ,  ? Abdominal ?  ?Peds ? Hematology ?  ?Anesthesia Other Findings ? ? Reproductive/Obstetrics ? ?  ? ? ? ? ? ? ? ? ? ? ? ? ? ?  ?  ? ? ? ? ? ? ? ? ?Anesthesia Physical ?Anesthesia Plan ? ?ASA: 2 ? ?Anesthesia Plan: MAC  ? ?Post-op Pain Management: Minimal or no pain anticipated  ? ?Induction: Intravenous ? ?PONV Risk Score and Plan: TIVA, Midazolam and Treatment may vary due to age or medical condition ? ?Airway Management Planned: Natural Airway and Nasal Cannula ? ?Additional Equipment:  ? ?Intra-op Plan:  ? ?Post-operative Plan:  ? ?Informed Consent: I have reviewed the patients History and Physical, chart, labs and discussed the procedure including the risks, benefits and alternatives for the proposed anesthesia with the patient or authorized representative who has indicated his/her understanding and acceptance.  ? ? ? ? ? ?Plan Discussed with: CRNA ? ?Anesthesia Plan Comments:   ? ? ? ? ? ? ?Anesthesia Quick Evaluation ? ?

## 2021-07-03 ENCOUNTER — Encounter: Payer: Self-pay | Admitting: Ophthalmology

## 2021-07-05 DIAGNOSIS — Z20822 Contact with and (suspected) exposure to covid-19: Secondary | ICD-10-CM | POA: Diagnosis not present

## 2021-07-08 ENCOUNTER — Encounter: Payer: Self-pay | Admitting: Ophthalmology

## 2021-07-08 ENCOUNTER — Other Ambulatory Visit: Payer: Self-pay

## 2021-07-08 ENCOUNTER — Ambulatory Visit (INDEPENDENT_AMBULATORY_CARE_PROVIDER_SITE_OTHER): Payer: Medicare Other | Admitting: Dermatology

## 2021-07-08 DIAGNOSIS — D2239 Melanocytic nevi of other parts of face: Secondary | ICD-10-CM | POA: Diagnosis not present

## 2021-07-08 DIAGNOSIS — D229 Melanocytic nevi, unspecified: Secondary | ICD-10-CM

## 2021-07-08 DIAGNOSIS — L814 Other melanin hyperpigmentation: Secondary | ICD-10-CM | POA: Diagnosis not present

## 2021-07-08 DIAGNOSIS — L918 Other hypertrophic disorders of the skin: Secondary | ICD-10-CM

## 2021-07-08 DIAGNOSIS — Z20822 Contact with and (suspected) exposure to covid-19: Secondary | ICD-10-CM | POA: Diagnosis not present

## 2021-07-08 DIAGNOSIS — L578 Other skin changes due to chronic exposure to nonionizing radiation: Secondary | ICD-10-CM

## 2021-07-08 DIAGNOSIS — L821 Other seborrheic keratosis: Secondary | ICD-10-CM

## 2021-07-08 DIAGNOSIS — Z872 Personal history of diseases of the skin and subcutaneous tissue: Secondary | ICD-10-CM | POA: Diagnosis not present

## 2021-07-08 DIAGNOSIS — L820 Inflamed seborrheic keratosis: Secondary | ICD-10-CM

## 2021-07-08 DIAGNOSIS — D1800 Hemangioma unspecified site: Secondary | ICD-10-CM | POA: Diagnosis not present

## 2021-07-08 DIAGNOSIS — Z1283 Encounter for screening for malignant neoplasm of skin: Secondary | ICD-10-CM

## 2021-07-08 DIAGNOSIS — H2512 Age-related nuclear cataract, left eye: Secondary | ICD-10-CM | POA: Diagnosis not present

## 2021-07-08 NOTE — Patient Instructions (Addendum)
Cryotherapy Aftercare ? ?Wash gently with soap and water everyday.   ?Apply Vaseline and Band-Aid daily until healed.  ? ? ? ?Seborrheic Keratosis ? ?What causes seborrheic keratoses? ?Seborrheic keratoses are harmless, common skin growths that first appear during adult life.  As time goes by, more growths appear.  Some people may develop a large number of them.  Seborrheic keratoses appear on both covered and uncovered body parts.  They are not caused by sunlight.  The tendency to develop seborrheic keratoses can be inherited.  They vary in color from skin-colored to gray, brown, or even black.  They can be either smooth or have a rough, warty surface.   ?Seborrheic keratoses are superficial and look as if they were stuck on the skin.  Under the microscope this type of keratosis looks like layers upon layers of skin.  That is why at times the top layer may seem to fall off, but the rest of the growth remains and re-grows.   ? ?Treatment ?Seborrheic keratoses do not need to be treated, but can easily be removed in the office.  Seborrheic keratoses often cause symptoms when they rub on clothing or jewelry.  Lesions can be in the way of shaving.  If they become inflamed, they can cause itching, soreness, or burning.  Removal of a seborrheic keratosis can be accomplished by freezing, burning, or surgery. ?If any spot bleeds, scabs, or grows rapidly, please return to have it checked, as these can be an indication of a skin cancer. ? ? ?If You Need Anything After Your Visit ? ?If you have any questions or concerns for your doctor, please call our main line at [phone] and press option 4 to reach your doctor's medical assistant. If no one answers, please leave a voicemail as directed and we will return your call as soon as possible. Messages left after 4 pm will be answered the following business day.  ? ?You may also send [place] a message via MyChart. We typically respond to MyChart messages within 1-2 business  days. ? ?For prescription refills, please ask your pharmacy to contact our office. Our fax number is [phone]. ? ?If you have an urgent issue when the clinic is closed that cannot wait until the next business day, you can page your doctor at the number below.   ? ?Please note that while we do our best to be available for urgent issues outside of office hours, we are not available 24/7.  ? ?If you have an urgent issue and are unable to reach [place], you may choose to seek medical care at your doctor's office, retail clinic, urgent care center, or emergency room. ? ?If you have a medical emergency, please immediately call 911 or go to the emergency department. ? ?Pager Numbers ? ?- Dr. Nehemiah Massed: [phone] ? ?- Dr. Laurence Ferrari: [phone] ? ?- Dr. Nicole Kindred: [phone] ? ?In the event of inclement weather, please call our main line at [phone] for an update on the status of any delays or closures. ? ?Dermatology Medication Tips: ?Please keep the boxes that topical medications come in in order to help keep track of the instructions about where and how to use these. Pharmacies typically print the medication instructions only on the boxes and not directly on the medication tubes.  ? ?If your medication is too expensive, please contact our office at [phone] option 4 or send [place] a message through [place].  ? ?We are unable to tell what your co-pay for medications will be in  advance as this is different depending on your insurance coverage. However, we may be able to find a substitute medication at lower cost or fill out paperwork to get insurance to cover a needed medication.  ? ?If a prior authorization is required to get your medication covered by your insurance company, please allow [place] 1-2 business days to complete this process. ? ?Drug prices often vary depending on where the prescription is filled and some pharmacies may offer cheaper prices. ? ?The website www.goodrx.com contains coupons for medications through  different pharmacies. The prices here do not account for what the cost may be with help from insurance (it may be cheaper with your insurance), but the website can give you the price if you did not use any insurance.  ?- You can print the associated coupon and take it with your prescription to the pharmacy.  ?- You may also stop by our office during regular business hours and pick up a GoodRx coupon card.  ?- If you need your prescription sent electronically to a different pharmacy, notify our office through [facility] or by phone at [phone] option 4. ? ? ? ? ?Si Usted Necesita Algo Despu?s de Su Visita ? ?Tambi?n puede enviarnos un mensaje a trav?s de MyChart. Por lo general respondemos a los mensajes de MyChart en el transcurso de 1 a 2 d?as h?biles. ? ?Para renovar recetas, por favor pida a su farmacia que se ponga en contacto con nuestra oficina. Nuestro n?mero de fax es el [phone]. ? ?Si tiene un asunto urgente cuando la cl?nica est? cerrada y que no puede esperar hasta el siguiente d?a h?bil, puede llamar/localizar a su doctor(a) al n?mero que aparece a continuaci?n.  ? ?Por favor, tenga en cuenta que aunque hacemos todo lo posible para estar disponibles para asuntos urgentes fuera del horario de oficina, no estamos disponibles las 24 horas del d?a, los 7 d?as de la semana.  ? ?Si tiene un problema urgente y no puede comunicarse con nosotros, puede optar por buscar atenci?n m?dica  en el consultorio de su doctor(a), en una cl?nica privada, en un centro de atenci?n urgente o en una sala de emergencias. ? ?Si tiene Engineer, maintenance (IT) m?dica, por favor llame inmediatamente al 911 o vaya a la sala de emergencias. ? ?N?meros de b?per ? ?- Dr. Nehemiah Massed: [phone] ? ?- Dra. Moye: [phone] ? ?- Dra. Nicole Kindred: [phone] ? ?En caso de inclemencias del tiempo, por favor llame a nuestra l?nea principal al [phone] para una actualizaci?n sobre el estado de cualquier retraso o cierre. ? ?Consejos  para la medicaci?n en dermatolog?a: ?Por favor, guarde las cajas en las que vienen los medicamentos de uso t?pico para ayudarle a seguir las instrucciones sobre d?nde y c?mo usarlos. Las farmacias generalmente imprimen las instrucciones del medicamento s?lo en las cajas y no directamente en los tubos del [place].  ? ?Si su medicamento es muy caro, por favor, p?ngase en contacto con Zigmund Daniel llamando al [phone] y presione la opci?n 4 o env?enos un mensaje a trav?s de MyChart.  ? ?No podemos decirle cu?l ser? su copago por los medicamentos por adelantado ya que esto es diferente dependiendo de la cobertura de su seguro. Sin embargo, es posible que podamos encontrar un medicamento sustituto a Electrical engineer un formulario para que el seguro cubra el medicamento que se considera necesario.  ? ?Si se requiere Ardelia Mems autorizaci?n previa para que su compa??a de seguros [place] su medicamento, por favor perm?tanos de 1 a 2  d?as h?biles para completar este proceso. ? ?Los precios de los medicamentos var?an con frecuencia dependiendo del Environmental consultant de d?nde se surte la receta y alguna farmacias pueden ofrecer precios m?s baratos. ? ?El sitio web www.goodrx.com tiene cupones para medicamentos de Airline pilot. Los precios aqu? no tienen en cuenta lo que podr?a costar con la ayuda del seguro (puede ser m?s barato con su seguro), pero el sitio web puede darle el precio si no utiliz? ning?n seguro.  ?- Puede imprimir el cup?n correspondiente y llevarlo con su receta a la farmacia.  ?- Tambi?n puede pasar por nuestra oficina durante el horario de atenci?n regular y recoger una tarjeta de cupones de GoodRx.  ?- Si necesita que su receta se env?e electr?nicamente a Chiropodist, informe a nuestra oficina a trav?s de MyChart de [place] o por tel?fono llamando al [phone] y presione la opci?n 4.  ?

## 2021-07-08 NOTE — Progress Notes (Signed)
? ?Follow-Up Visit ?  ?Subjective  ?Katherine Stanley is a 76 y.o. female who presents for the following: Total body skin exam (Hx of AKs). ? ?The patient presents for Total-Body Skin Exam (TBSE) for skin cancer screening and mole check.  The patient has spots, moles and lesions to be evaluated, some may be new or changing and the patient has concerns that these could be cancer. She has a few itchy spots to check that bother her. ? ?The following portions of the chart were reviewed this encounter and updated as appropriate:  ?  ?  ? ?Review of Systems:  No other skin or systemic complaints except as noted in HPI or Assessment and Plan. ? ?Objective  ?Well appearing patient in no apparent distress; mood and affect are within normal limits. ? ?A full examination was performed including scalp, head, eyes, ears, nose, lips, neck, chest, axillae, abdomen, back, buttocks, bilateral upper extremities, bilateral lower extremities, hands, feet, fingers, toes, fingernails, and toenails. All findings within normal limits unless otherwise noted below. ? ?R paranasal ? ?R paranasal ?6.23m pink flesh pap, present for years, no changes ? ?Right Upper Back x 2, R shoulder x 1 (3) ?Stuck on waxy paps with erythema ? ? ? ?Assessment & Plan  ? ?Lentigines ?- Scattered tan macules ?- Due to sun exposure ?- Benign-appearing, observe ?- Recommend daily broad spectrum sunscreen SPF 30+ to sun-exposed areas, reapply every 2 hours as needed. ?- Call for any changes ?- legs, back ? ?Seborrheic Keratoses ?- Stuck-on, waxy, tan-brown papules and/or plaques  ?- Benign-appearing ?- Discussed benign etiology and prognosis. ?- Observe ?- Call for any changes ?- legs, back, chest ? ?Melanocytic Nevi ?- Tan-brown and/or pink-flesh-colored symmetric macules and papules ?- Benign appearing on exam today ?- Observation ?- Call clinic for new or changing moles ?- Recommend daily use of broad spectrum spf 30+ sunscreen to sun-exposed areas.   ? ?Hemangiomas ?- Red papules ?- Discussed benign nature ?- Observe ?- Call for any changes ?- legs, back, abdomen ? ?Actinic Damage ?- Chronic condition, secondary to cumulative UV/sun exposure ?- diffuse scaly erythematous macules with underlying dyspigmentation ?- Recommend daily broad spectrum sunscreen SPF 30+ to sun-exposed areas, reapply every 2 hours as needed.  ?- Staying in the shade or wearing long sleeves, sun glasses (UVA+UVB protection) and wide brim hats (4-inch brim around the entire circumference of the hat) are also recommended for sun protection.  ?- Call for new or changing lesions. ? ?Acrochordons (Skin Tags) ?- Fleshy, skin-colored pedunculated papules ?- Benign appearing.  ?- Observe. ?- If desired, they can be removed with an in office procedure that is not covered by insurance. ?- Please call the clinic if you notice any new or changing lesions.  ? ?Skin cancer screening performed today. ? ?History of PreCancerous Actinic Keratosis  ?- site(s) of PreCancerous Actinic Keratosis clear today. ?- these may recur and new lesions may form requiring treatment to prevent transformation into skin cancer ?- observe for new or changing spots and [place] occur ?- photoprotection with sun protective clothing; sunglasses and broad spectrum sunscreen with SPF of at least 30 + and frequent self skin exams recommended ?- yearly exams by a dermatologist recommended for persons with history of PreCancerous Actinic Keratoses  ? ?Nevus ?R paranasal ? ?Benign-appearing.  Observation.  Call clinic for new or changing moles.  Recommend daily use of broad spectrum spf 30+ sunscreen to sun-exposed areas.   ? ?Inflamed  seborrheic keratosis (3) ?Right Upper Back x 2, R shoulder x 1 ? ?Destruction of lesion - Right Upper Back x 2, R shoulder x 1 ? ?Destruction method: cryotherapy   ?Informed consent: discussed and consent obtained   ?Lesion destroyed using liquid nitrogen: Yes    ?Region frozen until ice ball extended beyond lesion: Yes   ?Outcome: patient tolerated procedure well with no complications   ?Post-procedure details: wound care instructions given   ?Additional details:  Prior to procedure, discussed risks of blister formation, small wound, skin dyspigmentation, or rare scar following cryotherapy. Recommend Vaseline ointment to treated areas while healing.  ? ? ?Return in about 1 year (around 07/09/2022) for TBSE, Hx of AKs. ? ?I, Othelia Pulling, RMA, am acting as scribe for Brendolyn Patty, MD . ? ?Documentation: I have reviewed the above documentation for accuracy and completeness, and I agree with the above. ? ?Brendolyn Patty MD  ? ?

## 2021-07-11 NOTE — Discharge Instructions (Signed)

## 2021-07-15 ENCOUNTER — Ambulatory Visit (INDEPENDENT_AMBULATORY_CARE_PROVIDER_SITE_OTHER): Payer: Medicare Other | Admitting: Orthopaedic Surgery

## 2021-07-15 ENCOUNTER — Encounter: Payer: Self-pay | Admitting: Orthopaedic Surgery

## 2021-07-15 ENCOUNTER — Ambulatory Visit (INDEPENDENT_AMBULATORY_CARE_PROVIDER_SITE_OTHER): Payer: Medicare Other

## 2021-07-15 DIAGNOSIS — Z96642 Presence of left artificial hip joint: Secondary | ICD-10-CM

## 2021-07-15 NOTE — Progress Notes (Signed)
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  She is an active 76 year old female.  She is playing golf again.  She says her left hip is doing great and has no issues.  She is walking without a limp or an assistive device. ? ?Her left operative hip moves smoothly and fluidly.  Her right hip moves smoothly and fluidly. ? ?An AP pelvis and lateral of the left hip shows a well-seated total hip arthroplasty with no complicating features.  Her right hip appears normal. ? ?At this point follow-up is as needed since she is doing so well.  All questions and concerns were answered and addressed.  She understands if she has any orthopedic issues do not hesitate to call us. ?

## 2021-07-16 ENCOUNTER — Ambulatory Visit
Admission: RE | Admit: 2021-07-16 | Discharge: 2021-07-16 | Disposition: A | Discharge status: Home / Self Care | Payer: Medicare Other | Source: Ambulatory Visit | Attending: Ophthalmology | Admitting: Ophthalmology

## 2021-07-16 ENCOUNTER — Other Ambulatory Visit: Payer: Self-pay

## 2021-07-16 ENCOUNTER — Encounter
Admission: RE | Disposition: A | Discharge status: Home / Self Care | Payer: Self-pay | Source: Ambulatory Visit | Attending: Ophthalmology

## 2021-07-16 ENCOUNTER — Ambulatory Visit: Payer: Medicare Other | Admitting: Anesthesiology

## 2021-07-16 ENCOUNTER — Encounter: Payer: Self-pay | Admitting: Ophthalmology

## 2021-07-16 DIAGNOSIS — H2511 Age-related nuclear cataract, right eye: Secondary | ICD-10-CM | POA: Insufficient documentation

## 2021-07-16 DIAGNOSIS — I10 Essential (primary) hypertension: Secondary | ICD-10-CM | POA: Diagnosis not present

## 2021-07-16 DIAGNOSIS — H25811 Combined forms of age-related cataract, right eye: Secondary | ICD-10-CM | POA: Diagnosis not present

## 2021-07-16 HISTORY — PX: CATARACT EXTRACTION W/PHACO: SHX586

## 2021-07-16 SURGERY — PHACOEMULSIFICATION, CATARACT, WITH IOL INSERTION
Anesthesia: Monitor Anesthesia Care | Site: Eye | Laterality: Right

## 2021-07-16 MED ORDER — BRIMONIDINE TARTRATE-TIMOLOL 0.2-0.5 % OP SOLN
OPHTHALMIC | Status: DC | PRN
  Administered 2021-07-16: 1 [drp] via OPHTHALMIC

## 2021-07-16 MED ORDER — MOXIFLOXACIN HCL 0.5 % OP SOLN
OPHTHALMIC | Status: DC | PRN
  Administered 2021-07-16: 0.2 mL via OPHTHALMIC

## 2021-07-16 MED ORDER — SIGHTPATH DOSE#1 BSS IO SOLN
INTRAOCULAR | Status: DC | PRN
Start: 2021-07-16 — End: 2021-07-16
  Administered 2021-07-16: 15 mL via INTRAOCULAR

## 2021-07-16 MED ORDER — TETRACAINE HCL 0.5 % OP SOLN
1.0000 [drp] | OPHTHALMIC | Status: DC | PRN
  Administered 2021-07-16 (×3): 1 [drp] via OPHTHALMIC

## 2021-07-16 MED ORDER — ARMC OPHTHALMIC DILATING DROPS
1.0000 "application " | OPHTHALMIC | Status: DC | PRN
  Administered 2021-07-16 (×2): 1 via OPHTHALMIC

## 2021-07-16 MED ORDER — FENTANYL CITRATE (PF) 100 MCG/2ML IJ SOLN
INTRAMUSCULAR | Status: DC | PRN
  Administered 2021-07-16 (×2): 50 ug via INTRAVENOUS

## 2021-07-16 MED ORDER — SIGHTPATH DOSE#1 BSS IO SOLN
INTRAOCULAR | Status: DC | PRN
  Administered 2021-07-16: 49 mL via OPHTHALMIC

## 2021-07-16 MED ORDER — SIGHTPATH DOSE#1 NA CHONDROIT SULF-NA HYALURON 40-17 MG/ML IO SOLN
INTRAOCULAR | Status: DC | PRN
  Administered 2021-07-16: 1 mL via INTRAOCULAR

## 2021-07-16 MED ORDER — SIGHTPATH DOSE#1 BSS IO SOLN
INTRAOCULAR | Status: DC | PRN
  Administered 2021-07-16: 2 mL

## 2021-07-16 MED ORDER — MIDAZOLAM HCL 2 MG/2ML IJ SOLN
INTRAMUSCULAR | Status: DC | PRN
  Administered 2021-07-16: 2 mg via INTRAVENOUS

## 2021-07-16 SURGICAL SUPPLY — 12 items
CANNULA ANT/CHMB 27G (MISCELLANEOUS) IMPLANT
CANNULA ANT/CHMB 27GA (MISCELLANEOUS) IMPLANT
CATARACT SUITE SIGHTPATH (MISCELLANEOUS) ×2 IMPLANT
FEE CATARACT SUITE SIGHTPATH (MISCELLANEOUS) ×2 IMPLANT
GLOVE SURG ENC TEXT LTX SZ8 (GLOVE) ×3 IMPLANT
GLOVE SURG TRIUMPH 8.0 PF LTX (GLOVE) ×3 IMPLANT
LENS IOL TECNIS EYHANCE 21.0 (Intraocular Lens) ×1 IMPLANT
NDL FILTER BLUNT 18X1 1/2 (NEEDLE) ×2 IMPLANT
NEEDLE FILTER BLUNT 18X 1/2SAF (NEEDLE) ×1
NEEDLE FILTER BLUNT 18X1 1/2 (NEEDLE) ×1 IMPLANT
SYR 3ML LL SCALE MARK (SYRINGE) ×3 IMPLANT
WATER STERILE IRR 250ML POUR (IV SOLUTION) ×3 IMPLANT

## 2021-07-16 NOTE — Anesthesia Postprocedure Evaluation (Signed)
Anesthesia Post Note ? ?Patient: Katherine Stanley ? ?Procedure(s) Performed: CATARACT EXTRACTION PHACO AND INTRAOCULAR LENS PLACEMENT (IOC) RIGHT EYHANCE 5.89 00:51.9 (Right: Eye) ? ? ?  ?Patient location during evaluation: PACU ?Anesthesia Type: MAC ?Level of consciousness: awake ?Pain management: pain level controlled ?Vital Signs Assessment: post-procedure vital signs reviewed and stable ?Respiratory status: respiratory function stable ?Cardiovascular status: stable ?Postop Assessment: no apparent nausea or vomiting ?Anesthetic complications: no ? ? ?No notable events documented. ? ?Veda Canning ? ? ? ? ? ?

## 2021-07-16 NOTE — Transfer of Care (Signed)
Immediate Anesthesia Transfer of Care Note ? ?Patient: Katherine Stanley ? ?Procedure(s) Performed: CATARACT EXTRACTION PHACO AND INTRAOCULAR LENS PLACEMENT (IOC) RIGHT EYHANCE (Right) ? ?Patient Location: PACU ? ?Anesthesia Type: MAC ? ?Level of Consciousness: awake, alert  and patient cooperative ? ?Airway and Oxygen Therapy: Patient Spontanous Breathing and Patient connected to supplemental oxygen ? ?Post-op Assessment: Post-op Vital signs reviewed, Patient's Cardiovascular Status Stable, Respiratory Function Stable, Patent Airway and No signs of Nausea or vomiting ? ?Post-op Vital Signs: Reviewed and stable ? ?Complications: No notable events documented. ? ?

## 2021-07-16 NOTE — Op Note (Signed)
PREOPERATIVE DIAGNOSIS:  Nuclear sclerotic cataract of the right eye. ?  ?POSTOPERATIVE DIAGNOSIS:  Cataract ?  ?OPERATIVE PROCEDURE:ORPROCALL@ ?  ?SURGEON:  Birder Robson, MD. ?  ?ANESTHESIA: ? ?Anesthesiologist: Veda Canning, MD ?CRNA: Jeannene Patella, CRNA ? ?1.      Managed anesthesia care. ?2.      0.51m of Shugarcaine was instilled in the eye following the paracentesis. ?  ?COMPLICATIONS:  None. ?  ?TECHNIQUE:   Stop and chop ?  ?DESCRIPTION OF PROCEDURE:  The patient was examined and consented in the preoperative holding area where the aforementioned topical anesthesia was applied to the right eye and then brought back to the Operating Room where the right eye was prepped and draped in the usual sterile ophthalmic fashion and a lid speculum was placed. A paracentesis was created with the side port blade and the anterior chamber was filled with viscoelastic. A near clear corneal incision was performed with the steel keratome. A continuous curvilinear capsulorrhexis was performed with a cystotome followed by the capsulorrhexis forceps. Hydrodissection and hydrodelineation were carried out with BSS on a blunt cannula. The lens was removed in a stop and chop  technique and the remaining cortical material was removed with the irrigation-aspiration handpiece. The capsular bag was inflated with viscoelastic and the Technis [id number]  lens was placed in the capsular bag without complication. The remaining viscoelastic was removed from the eye with the irrigation-aspiration handpiece. The wounds were hydrated. The anterior chamber was flushed with BSS and the eye was inflated to physiologic pressure. 0.176mof Vigamox was placed in the anterior chamber. The wounds were found to be water tight. The eye was dressed with Combigan. The patient was given protective glasses to wear throughout the day and a shield with which to sleep tonight. The patient was also given drops with which to begin a drop regimen today and  will follow-up with me in one day. ?Implant Name Type Inv. Item Serial No. Manufacturer Lot No. LRB No. Used Action  ?LENS IOL TECNIS EYHANCE 21.0 - [id number] Lens LENS IOL TECNIS EYHANCE 21.0 [id number]  Right 1 Implanted  ? ?Procedure(s): ?CATARACT EXTRACTION PHACO AND INTRAOCULAR LENS PLACEMENT (IOC) RIGHT EYHANCE 5.89 00:51.9 (Right) ? ?Electronically signed: WiBirder Robson/07/2021 12:30 PM ? ?

## 2021-07-16 NOTE — Anesthesia Preprocedure Evaluation (Signed)
Anesthesia Evaluation  ?Patient identified by MRN, date of birth, ID band ?Patient awake ? ? ? ?Reviewed: ?Allergy & Precautions, NPO status  ? ?History of Anesthesia Complications ?(+) PONV and history of anesthetic complications ? ?Airway ?Mallampati: II ? ?TM Distance: >3 FB ? ? ? ? Dental ?  ?Pulmonary ? ?  ?Pulmonary exam normal ? ? ? ? ? ? ? Cardiovascular ?hypertension,  ?Rhythm:Regular Rate:Normal ? ? ?  ?Neuro/Psych ? Headaches,   ? GI/Hepatic ?  ?Endo/Other  ? ? Renal/GU ?  ? ?  ?Musculoskeletal ? ?(+) Arthritis ,  ? Abdominal ?  ?Peds ? Hematology ?  ?Anesthesia Other Findings ? ? Reproductive/Obstetrics ? ?  ? ? ? ? ? ? ? ? ? ? ? ? ? ?  ?  ? ? ? ? ? ? ? ? ?Anesthesia Physical ? ?Anesthesia Plan ? ?ASA: 2 ? ?Anesthesia Plan: MAC  ? ?Post-op Pain Management: Minimal or no pain anticipated  ? ?Induction: Intravenous ? ?PONV Risk Score and Plan: 3 and TIVA, Midazolam and Treatment may vary due to age or medical condition ? ?Airway Management Planned: Natural Airway and Nasal Cannula ? ?Additional Equipment:  ? ?Intra-op Plan:  ? ?Post-operative Plan:  ? ?Informed Consent: I have reviewed the patients History and Physical, chart, labs and discussed the procedure including the risks, benefits and alternatives for the proposed anesthesia with the patient or authorized representative who has indicated his/her understanding and acceptance.  ? ? ? ? ? ?Plan Discussed with: CRNA ? ?Anesthesia Plan Comments:   ? ? ? ? ? ? ?Anesthesia Quick Evaluation ? ?

## 2021-07-16 NOTE — H&P (Signed)
[facility]  ? ?Primary Care Physician:  Katherine Haven, MD ?Ophthalmologist: Dr. George Stanley ? ?Pre-Procedure History & Physical: ?HPI:  Katherine Stanley is a 76 y.o. female here for cataract surgery. ?  ?Past Medical History:  ?Diagnosis Date  ? 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   ? Arthritis   ? Diverticulosis   ? Headache   ? occasionally  ? Hypertension   ? takes Lisinopril daily  ? Joint pain   ? Osteopenia   ? PONV (postoperative nausea and vomiting)   ? Slight nausea  ? Rosacea   ? ? ?Past Surgical History:  ?Procedure Laterality Date  ? CATARACT EXTRACTION W/PHACO Left 07/02/2021  ? Procedure: CATARACT EXTRACTION PHACO AND INTRAOCULAR LENS PLACEMENT (I[facility]) LEFT EYHANCE 6.84 00:46.3;  Surgeon: Katherine Robson, MD;  Location: [place];  Service: Ophthalmology;  Laterality: Left;  ? CHOLECYSTECTOMY    ? CHOLECYSTECTOMY    ? COLONOSCOPY    ? COLONOSCOPY WITH PROPOFOL N/A 11/25/2019  ? Procedure: COLONOSCOPY WITH PROPOFOL;  Surgeon: Katherine Manifold, MD;  Location: [facility];  Service: Gastroenterology;  Laterality: N/A;  ? EYE SURGERY  2013  ? LASIK  ? REFRACTIVE SURGERY    ? SHOULDER ARTHROSCOPY WITH OPEN ROTATOR CUFF REPAIR AND DISTAL CLAVICLE ACROMINECTOMY Right 09/19/2014  ? Procedure: SHOULDER ARTHROSCOPY WITH MINI-OPEN ROTATOR CUFF REPAIR AND DISTAL CLAVICLE RESECTION, SUBACROMIAL DECOMPRESSION;  Surgeon: Garald Balding, MD;  Location: [place];  Service: Orthopedics;  Laterality: Right;  ? TONSILLECTOMY  at age 89  ? TOTAL HIP ARTHROPLASTY Left 11/20/2020  ? Procedure: LEFT TOTAL HIP ARTHROPLASTY ANTERIOR APPROACH;  Surgeon: Katherine Rossetti, MD;  Location: [place];  Service: Orthopedics;  Laterality: Left;  RNFA  ? TOTAL KNEE ARTHROPLASTY Right 03/18/2016  ? TOTAL KNEE ARTHROPLASTY Right 03/18/2016  ? Procedure: TOTAL KNEE ARTHROPLASTY;  Surgeon: Garald Balding, MD;  Location: [facility];  Service: Orthopedics;  Laterality: Right;  ? VAGINAL  HYSTERECTOMY  at age 10  ? ? ?Prior to Admission medications   ?Medication Sig Start Date End Date Taking? Authorizing Provider  ?acetaminophen (TYLENOL) 650 MG CR tablet Take 1,300 mg by mouth every 8 (eight) hours as needed for pain.   Yes [provider]  ?Calcium 250 MG CAPS Take by mouth daily.   Yes [provider]  ?Carboxymethylcellulose Sodium (DRY EYE RELIEF OP) Place 1 drop into both eyes daily.   Yes [provider]  ?cholecalciferol (VITAMIN D) 1000 units tablet Take 1,000 Units by mouth daily.   Yes [provider]  ?Coenzyme Q10 (COQ-10) 100 MG CAPS Take 100 mg by mouth daily.   Yes [provider]  ?diclofenac Sodium (VOLTAREN) 1 % GEL Apply 1 application topically 4 (four) times daily as needed (pain).   Yes [provider]  ?ferrous gluconate (FERGON) 240 (27 FE) MG tablet Take 240 mg by mouth daily.   Yes [provider]  ?Fluocinolone Acetonide Body 0.01 % OIL Apply 1 application topically 2 (two) times daily. Apply 1-2 drops to aa's ears QD-BID PRN. 03/20/21 03/20/22 Yes Katherine Bathe, MD  ?Katherine Stanley 350 MG CAPS Take 350 mg by mouth daily.   Yes [provider]  ?lisinopril (ZESTRIL) 10 MG tablet Take 1/2 (one-half) tablet by mouth once daily 03/15/21  Yes Katherine Haven, MD  ?Lysine 500 MG CAPS Take 500 mg by mouth daily as needed (fever blisters).   Yes [provider]  ?methocarbamol (ROBAXIN) 500  MG tablet Take 1 tablet (500 mg total) by mouth every 6 (six) hours as needed for muscle spasms. 11/21/20  Yes Katherine Rossetti, MD  ?Multiple Vitamins-Minerals (EYE VITAMINS PO) Take 1 tablet by mouth daily.   Yes [provider]  ?OVER THE COUNTER MEDICATION Take 1 tablet by mouth daily. Beet root supplement   Yes [provider]  ?Plant Sterols and Stanols (CHOLEST OFF PO) Take 1 tablet by mouth daily.   Yes [provider]  ?Potassium 99 MG TABS Take 99 mg by mouth daily.   Yes  [provider]  ?Probiotic Product (PROBIOTIC DAILY PO) Take by mouth.   Yes [provider]  ?vitamin B-12 (CYANOCOBALAMIN) 1000 MCG tablet Take 1,000 mcg by mouth daily.   Yes [provider]  ?vitamin k 100 MCG tablet Take 100 mcg by mouth daily.   Yes [provider]  ? ? ?Allergies as of 05/27/2021 - Review Complete 05/14/2021  ?Allergen Reaction Noted  ? Alendronate Other (See Comments) 10/01/2012  ? Aleve [naproxen sodium] Nausea And Vomiting 09/07/2014  ? ? ?Family History  ?Problem Relation Age of Onset  ? Hypertension Mother   ? Heart disease Mother   ? Kidney disease Mother   ? Diabetes Father   ? Hypertension Father   ? Heart disease Father   ? Heart disease Brother   ? Cancer Maternal Aunt   ? Ovarian cancer Maternal Aunt 85  ? Stroke Brother   ? Alzheimer's disease Brother   ? Diabetes Paternal Grandmother   ? Breast cancer Neg Hx   ? ? ?Social History  ? ?Socioeconomic History  ? Marital status: Married  ?  Spouse name: Not on file  ? Number of children: Not on file  ? Years of education: Not on file  ? Highest education level: Not on file  ?Occupational History  ? Not on file  ?Tobacco Use  ? Smoking status: Never  ? Smokeless tobacco: Never  ?Vaping Use  ? Vaping Use: Never used  ?Substance and Sexual Activity  ? Alcohol use: No  ?  Alcohol/week: 0.0 standard drinks  ? Drug use: No  ? Sexual activity: Yes  ?  Birth control/protection: Post-menopausal, Surgical  ?Other Topics Concern  ? Not on file  ?Social History Narrative  ? Lives in [place] with husband. No children. No pets.  ?   ? Work - retired Sales promotion account executive  ? Diet - regular  ? Exercise - golf, gardening  ? ?Social Determinants of Health  ? ?Financial Resource Strain: Low Risk   ? Difficulty of Paying Living Expenses: Not hard at all  ?Food Insecurity: No Food Insecurity  ? Worried About Charity fundraiser in the Last Year: Never true  ? Ran Out of Food in the Last Year: Never true   ?Transportation Needs: No Transportation Needs  ? Lack of Transportation (Medical): No  ? Lack of Transportation (Non-Medical): No  ?Physical Activity: Sufficiently Active  ? Days of Exercise per Week: 7 days  ? Minutes of Exercise per Session: 30 min  ?Stress: No Stress Concern Present  ? Feeling of Stress : Not at all  ?Social Connections: Not on file  ?Intimate Partner Violence: Not At Risk  ? Fear of Current or Ex-Partner: No  ? Emotionally Abused: No  ? Physically Abused: No  ? Sexually Abused: No  ? ? ?Review of Systems: ?See HPI, otherwise negative ROS ? ?Physical Exam: ?BP (!) 153/87   Pulse 95  Temp 98.2 ?F (36.8 ?C) (Temporal)   Resp (!) 22   Wt 75.3 kg   SpO2 95%   BMI 31.37 kg/m?  ?General:   Alert, cooperative in NAD ?Head:  Normocephalic and atraumatic. ?Respiratory:  Normal work of breathing. ?Cardiovascular:  RRR ? ?Impression/Plan: ?RUIE SENDEJO is here for cataract surgery. ? ?Risks, benefits, limitations, and alternatives regarding cataract surgery have been reviewed with the patient.  Questions have been answered.  All parties agreeable. ? ? ?Katherine Robson, MD  07/16/2021, 12:05 PM ? ? ?

## 2021-07-16 NOTE — Anesthesia Procedure Notes (Signed)
Procedure Name: [place] ?Date/Time: 07/16/2021 12:16 PM ?Performed by: Jeannene Patella, CRNA ?Pre-anesthesia Checklist: Patient identified, Emergency Drugs available, Suction available, Timeout performed and Patient being monitored ?Patient Re-evaluated:Patient Re-evaluated prior to induction ?Oxygen Delivery Method: Nasal cannula ?Placement Confirmation: positive ETCO2 ? ? ? ? ?

## 2021-07-17 ENCOUNTER — Encounter: Payer: Self-pay | Admitting: Ophthalmology

## 2021-07-22 DIAGNOSIS — R051 Acute cough: Secondary | ICD-10-CM | POA: Diagnosis not present

## 2021-07-22 DIAGNOSIS — R059 Cough, unspecified: Secondary | ICD-10-CM | POA: Diagnosis not present

## 2021-07-22 DIAGNOSIS — Z20822 Contact with and (suspected) exposure to covid-19: Secondary | ICD-10-CM | POA: Diagnosis not present

## 2021-07-27 DIAGNOSIS — Z20822 Contact with and (suspected) exposure to covid-19: Secondary | ICD-10-CM | POA: Diagnosis not present

## 2021-07-29 DIAGNOSIS — Z20822 Contact with and (suspected) exposure to covid-19: Secondary | ICD-10-CM | POA: Diagnosis not present

## 2021-08-13 DIAGNOSIS — Z20822 Contact with and (suspected) exposure to covid-19: Secondary | ICD-10-CM | POA: Diagnosis not present

## 2021-08-14 DIAGNOSIS — Z20822 Contact with and (suspected) exposure to covid-19: Secondary | ICD-10-CM | POA: Diagnosis not present

## 2021-08-19 DIAGNOSIS — Z20822 Contact with and (suspected) exposure to covid-19: Secondary | ICD-10-CM | POA: Diagnosis not present

## 2021-09-20 ENCOUNTER — Ambulatory Visit (INDEPENDENT_AMBULATORY_CARE_PROVIDER_SITE_OTHER): Payer: Medicare Other

## 2021-09-20 VITALS — BP 122/77 | HR 90 | Resp 15 | Ht 61.0 in | Wt 170.6 lb

## 2021-09-20 DIAGNOSIS — Z0000 Encounter for general adult medical examination without abnormal findings: Secondary | ICD-10-CM

## 2021-09-20 NOTE — Patient Instructions (Addendum)
  Katherine Stanley , Thank you for taking time to come for your Medicare Wellness Visit. I appreciate your ongoing commitment to your health goals. Please review the following plan we discussed and let me know if I can assist you in the future.   These are the goals we discussed:  Goals      Weight goal 135lb     Stay hydrated  Low carb diet; portion control  Stay active        This is a list of the screening recommended for you and due dates:  Health Maintenance  Topic Date Due   COVID-19 Vaccine (5 - Booster for Pfizer series) 10/06/2021*   Tetanus Vaccine  09/21/2022*   Flu Shot  11/12/2021   Mammogram  12/14/2021   DEXA scan (bone density measurement)  05/10/2023   Colon Cancer Screening  11/24/2024   Pneumonia Vaccine  Completed   Hepatitis C Screening: USPSTF Recommendation to screen - Ages 18-79 yo.  Completed   HPV Vaccine  Aged Out   Zoster (Shingles) Vaccine  Discontinued  *Topic was postponed. The date shown is not the original due date.

## 2021-09-20 NOTE — Progress Notes (Signed)
Subjective:   Katherine Stanley is a 76 y.o. female who presents for Medicare Annual (Subsequent) preventive examination.  Review of Systems    No ROS.  Medicare Wellness Virtual Visit.  Visual/audio telehealth visit, UTA vital signs.   See social history for additional risk factors.   Cardiac Risk Factors include: advanced age (>38mn, >>35women)     Objective:    Today's Vitals   09/20/21 0825  BP: 122/77  Pulse: 90  Resp: 15  SpO2: 94%  Weight: 170 lb 9.6 oz (77.4 kg)  Height: '5\' 1"'$  (1.549 m)   Body mass index is 32.23 kg/m.     09/20/2021    8:40 AM 07/02/2021    7:00 AM 11/21/2020    8:00 AM 11/20/2020    4:10 PM 11/20/2020   10:15 AM 11/13/2020    9:55 AM 09/19/2020   10:11 AM  Advanced Directives  Does Patient Have a Medical Advance Directive? Yes Yes  Yes Yes Yes Yes  Type of AParamedicof ATevistonLiving will HVictoriaLiving will HJayuyaLiving will Living will;Healthcare Power of AMorrison CrossroadsLiving will HPittsburgLiving will  Does patient want to make changes to medical advance directive? No - Patient declined No - Patient declined Yes (Inpatient - patient requests chaplain consult to change a medical advance directive)    No - Patient declined  Copy of HCoal Forkin Chart? No - copy requested No - copy requested  No - copy requested  No - copy requested No - copy requested    Current Medications (verified) Outpatient Encounter Medications as of 09/20/2021  Medication Sig   acetaminophen (TYLENOL) 650 MG CR tablet Take 1,300 mg by mouth every 8 (eight) hours as needed for pain.   Calcium 250 MG CAPS Take by mouth daily.   Carboxymethylcellulose Sodium (DRY EYE RELIEF OP) Place 1 drop into both eyes daily.   cholecalciferol (VITAMIN D) 1000 units tablet Take 1,000 Units by mouth daily.   Coenzyme Q10 (COQ-10) 100 MG CAPS Take 100 mg by mouth  daily.   diclofenac Sodium (VOLTAREN) 1 % GEL Apply 1 application topically 4 (four) times daily as needed (pain).   ferrous gluconate (FERGON) 240 (27 FE) MG tablet Take 240 mg by mouth daily.   Fluocinolone Acetonide Body 0.01 % OIL Apply 1 application topically 2 (two) times daily. Apply 1-2 drops to aa's ears QD-BID PRN.   Krill Oil 350 MG CAPS Take 350 mg by mouth daily.   lisinopril (ZESTRIL) 10 MG tablet Take 1/2 (one-half) tablet by mouth once daily   Lysine 500 MG CAPS Take 500 mg by mouth daily as needed (fever blisters).   methocarbamol (ROBAXIN) 500 MG tablet Take 1 tablet (500 mg total) by mouth every 6 (six) hours as needed for muscle spasms.   Multiple Vitamins-Minerals (EYE VITAMINS PO) Take 1 tablet by mouth daily.   OVER THE COUNTER MEDICATION Take 1 tablet by mouth daily. Beet root supplement   Plant Sterols and Stanols (CHOLEST OFF PO) Take 1 tablet by mouth daily.   Potassium 99 MG TABS Take 99 mg by mouth daily.   Probiotic Product (PROBIOTIC DAILY PO) Take by mouth.   vitamin B-12 (CYANOCOBALAMIN) 1000 MCG tablet Take 1,000 mcg by mouth daily.   vitamin k 100 MCG tablet Take 100 mcg by mouth daily.   No facility-administered encounter medications on file as of 09/20/2021.  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    Arthritis    Diverticulosis    Headache    occasionally   Hypertension    takes Lisinopril daily   Joint pain    Osteopenia    PONV (postoperative nausea and vomiting)    Slight nausea   Rosacea    Past Surgical History:  Procedure Laterality Date   CATARACT EXTRACTION W/PHACO Left 07/02/2021   Procedure: CATARACT EXTRACTION PHACO AND INTRAOCULAR LENS PLACEMENT ([place]) LEFT EYHANCE 6.84 00:46.3;  Surgeon: Birder Robson, MD;  Location: [facility];  Service: Ophthalmology;  Laterality: Left;   CATARACT  EXTRACTION W/PHACO Right 07/16/2021   Procedure: CATARACT EXTRACTION PHACO AND INTRAOCULAR LENS PLACEMENT ([facility]) RIGHT EYHANCE 5.89 00:51.9;  Surgeon: Birder Robson, MD;  Location: [facility];  Service: Ophthalmology;  Laterality: Right;   CHOLECYSTECTOMY     CHOLECYSTECTOMY     COLONOSCOPY     COLONOSCOPY WITH PROPOFOL N/A 11/25/2019   Procedure: COLONOSCOPY WITH PROPOFOL;  Surgeon: Virgel Manifold, MD;  Location: [facility];  Service: Gastroenterology;  Laterality: N/A;   EYE SURGERY  2013   LASIK   REFRACTIVE SURGERY     SHOULDER ARTHROSCOPY WITH OPEN ROTATOR CUFF REPAIR AND DISTAL CLAVICLE ACROMINECTOMY Right 09/19/2014   Procedure: SHOULDER ARTHROSCOPY WITH MINI-OPEN ROTATOR CUFF REPAIR AND DISTAL CLAVICLE RESECTION, SUBACROMIAL DECOMPRESSION;  Surgeon: Garald Balding, MD;  Location: [place];  Service: Orthopedics;  Laterality: Right;   TONSILLECTOMY  at age 47   TOTAL HIP ARTHROPLASTY Left 11/20/2020   Procedure: LEFT TOTAL HIP ARTHROPLASTY ANTERIOR APPROACH;  Surgeon: Mcarthur Rossetti, MD;  Location: [place];  Service: Orthopedics;  Laterality: Left;  RNFA   TOTAL KNEE ARTHROPLASTY Right 03/18/2016   TOTAL KNEE ARTHROPLASTY Right 03/18/2016   Procedure: TOTAL KNEE ARTHROPLASTY;  Surgeon: Garald Balding, MD;  Location: [facility];  Service: Orthopedics;  Laterality: Right;   VAGINAL HYSTERECTOMY  at age 37   Family History  Problem Relation Age of Onset   Hypertension Mother    Heart disease Mother    Kidney disease Mother    Diabetes Father    Hypertension Father    Heart disease Father    Heart disease Brother    Cancer Maternal Aunt    Ovarian cancer Maternal Aunt 85   Stroke Brother    Alzheimer's disease Brother    Diabetes Paternal Grandmother    Breast cancer Neg Hx    Social History   Socioeconomic History   Marital status: Married    Spouse name: Not on file   Number of children: Not on file   Years of education: Not on file   Highest  education level: Not on file  Occupational History   Not on file  Tobacco Use   Smoking status: Never   Smokeless tobacco: Never  Vaping Use   Vaping Use: Never used  Substance and Sexual Activity   Alcohol use: No    Alcohol/week: 0.0 standard drinks of alcohol   Drug use: No   Sexual activity: Yes    Birth control/protection: Post-menopausal, Surgical  Other Topics Concern   Not on file  Social History Narrative   Lives in [place] with husband. No children. No pets.      Work - retired Sales promotion account executive   Diet - regular   Exercise - golf, gardening   Social  Determinants of Health   Financial Resource Strain: Low Risk  (09/20/2021)   Overall Financial Resource Strain (CARDIA)    Difficulty of Paying Living Expenses: Not hard at all  Food Insecurity: No Food Insecurity (09/20/2021)   Hunger Vital Sign    Worried About Running Out of Food in the Last Year: Never true    Ran Out of Food in the Last Year: Never true  Transportation Needs: No Transportation Needs (09/20/2021)   PRAPARE - Hydrologist (Medical): No    Lack of Transportation (Non-Medical): No  Physical Activity: Sufficiently Active (09/20/2021)   Exercise Vital Sign    Days of Exercise per Week: 7 days    Minutes of Exercise per Session: 30 min  Stress: No Stress Concern Present (09/20/2021)   [place]    Feeling of Stress : Not at all  Social Connections: Unknown (09/20/2021)   Social Connection and Isolation Panel [NHANES]    Frequency of Communication with Friends and Family: More than three times a week    Frequency of Social Gatherings with Friends and Family: More than three times a week    Attends Religious Services: Not on Advertising copywriter or Organizations: Not on file    Attends Archivist Meetings: Not on file    Marital Status: Married    Tobacco Counseling Counseling given: Not  Answered   Clinical Intake:  Pre-visit preparation completed: Yes        Diabetes: No  How often do you need to have someone help you when you read instructions, pamphlets, or other written materials from your doctor or pharmacy?: 1 - Never    Interpreter Needed?: No      Activities of Daily Living    09/20/2021    8:28 AM 07/02/2021    7:02 AM  In your present state of health, do you have any difficulty performing the following activities:  Hearing? 0 0  Vision? 0 0  Difficulty concentrating or making decisions? 0 0  Walking or climbing stairs? 0 0  Dressing or bathing? 0 0  Doing errands, shopping? 0   Preparing Food and eating ? N   Using the Toilet? N   In the past six months, have you accidently leaked urine? N   Do you have problems with loss of bowel control? N   Managing your Medications? N   Managing your Finances? N   Housekeeping or managing your Housekeeping? N     Patient Care Team: [name], MD as PCP - General (Family Medicine)  Indicate any recent Medical Services you may have received from other than [facility] providers in the past year (date may be approximate).     Assessment:   This is a routine wellness examination for Alithia.  Hearing/Vision screen Hearing Screening - Comments:: Patient is able to hear conversational tones without difficulty.  No issues reported. Vision Screening - Comments:: Followed by [facility] Dry eye; drops in use.  Cataract extraction, bilateral They have regular follow up with the ophthalmologist  Dietary issues and exercise activities discussed: Current Exercise Habits: Home exercise routine, Type of exercise: walking;calisthenics (Golfing 3x weekly), Time (Minutes): 30, Frequency (Times/Week): 5, Weekly Exercise (Minutes/Week): 150, Intensity: Moderate   Goals Addressed             This Visit's Progress    Weight goal 135lb  Stay hydrated  Low carb diet; portion control  Stay  active       Depression Screen    09/20/2021    8:32 AM 09/19/2020    9:43 AM 09/09/2019    9:09 AM 04/20/2019    3:07 PM 09/03/2018   11:14 AM 08/31/2017    8:33 AM 08/28/2016    8:23 AM  PHQ 2/9 Scores  PHQ - 2 Score 0 0 0 0 0 0 0  PHQ- 9 Score       0    Fall Risk    09/20/2021    8:33 AM 09/19/2020    9:44 AM 09/09/2019    8:50 AM 04/20/2019    3:07 PM 09/03/2018   11:14 AM  Fall Risk   Falls in the past year? 0 0 0 0 0  Number falls in past yr: 0 0 0 0   Injury with Fall?  0     Follow up Falls evaluation completed Falls evaluation completed  Falls evaluation completed     [place]: Home free of loose throw rugs in walkways, pet beds, electrical cords, etc? Yes  Adequate lighting in your home to reduce risk of falls? Yes   ASSISTIVE DEVICES UTILIZED TO PREVENT FALLS: Life alert? No  Use of a cane, walker or w/c? No  Grab bars in the bathroom? No  Shower chair or bench in shower? No  Elevated toilet seat or a handicapped toilet? No   TIMED UP AND GO: Was the test performed? Yes .  Length of time to ambulate 10 feet: 10 sec.   Gait steady and fast without use of assistive device  Cognitive Function: Patient is alert and oriented x3.  Enjoys reading, socializing, golfing, completing jigsaw puzzles and other brain health stimulating activities.     08/31/2017    8:38 AM 08/28/2016    8:58 AM 08/29/2015    8:41 AM  MMSE - Mini Mental State Exam  Orientation to time '5 5 5  '$ Orientation to Place '5 5 5  '$ Registration '3 3 3  '$ Attention/ Calculation '5 5 5  '$ Recall '3 2 3  '$ Language- name 2 objects '2 2 2  '$ Language- repeat '1 1 1  '$ Language- follow 3 step command '3 3 3  '$ Language- read & follow direction '1 1 1  '$ Write a sentence '1 1 1  '$ Copy design '1 1 1  '$ Total score '30 29 30        '$ 09/03/2018   11:15 AM  6CIT Screen  What Year? 0 points  What month? 0 points  What time? 0 points  Count back from 20 0 points  Months in reverse 0 points   Repeat phrase 0 points  Total Score 0 points    Immunizations Immunization History  Administered Date(s) Administered   Fluad Quad(high Dose 65+) 01/03/2019, 01/27/2020, 03/18/2021   Influenza, High Dose Seasonal PF 01/16/2017, 02/19/2018   Influenza,inj,Quad PF,6+ Mos 02/24/2013, 01/23/2014, 02/28/2015, 01/28/2016   PFIZER Comirnaty(Gray Top)Covid-19 Tri-Sucrose Vaccine 10/17/2020   PFIZER(Purple Top)SARS-COV-2 Vaccination 05/24/2019, 06/14/2019, 02/02/2020   Pneumococcal Conjugate-13 07/20/2013   Pneumococcal Polysaccharide-23 07/24/2014   Td 07/16/2010   TDAP status: Due, Education has been provided regarding the importance of this vaccine. Advised may receive this vaccine at local pharmacy or Health Dept. Aware to provide a copy of the vaccination record if obtained from local pharmacy or Health Dept. Verbalized acceptance and understanding.  Screening Tests Health Maintenance  Topic Date Due  COVID-19 Vaccine (5 - Booster for Pfizer series) 10/06/2021 (Originally 12/12/2020)   TETANUS/TDAP  09/21/2022 (Originally 07/15/2020)   INFLUENZA VACCINE  11/12/2021   MAMMOGRAM  12/14/2021   DEXA SCAN  05/10/2023   COLONOSCOPY (Pts 45-63yr Insurance coverage will need to be confirmed)  11/24/2024   Pneumonia Vaccine 75 Years old  Completed   Hepatitis C Screening  Completed   HPV VACCINES  Aged Out   Zoster Vaccines- Shingrix  Discontinued   Health Maintenance There are no preventive care reminders to display for this patient.  Lung Cancer Screening: (Low Dose CT Chest recommended if Age 75-80years, 30 pack-year currently smoking OR have quit w/in 15years.) does not qualify.   Vision Screening: Recommended annual ophthalmology exams for early detection of glaucoma and other disorders of the eye.  Dental Screening: Recommended annual dental exams for proper oral hygiene  Community Resource Referral / Chronic Care Management: CRR required this visit?  No   CCM required this  visit?  No      Plan:   Keep all routine maintenance appointments.   I have personally reviewed and noted the following in the patient's chart:   Medical and social history Use of alcohol, tobacco or illicit drugs  Current medications and supplements including opioid prescriptions.  Functional ability and status Nutritional status Physical activity Advanced directives List of other physicians Hospitalizations, surgeries, and ER visits in previous 12 months Vitals Screenings to include cognitive, depression, and falls Referrals and appointments  In addition, I have reviewed and discussed with patient certain preventive protocols, quality metrics, and best practice recommendations. A written personalized care plan for preventive services as well as general preventive health recommendations were provided to patient.     OVarney Biles LPN   [id number]

## 2021-10-31 ENCOUNTER — Other Ambulatory Visit: Payer: Self-pay | Admitting: Family Medicine

## 2021-10-31 DIAGNOSIS — Z1231 Encounter for screening mammogram for malignant neoplasm of breast: Secondary | ICD-10-CM

## 2021-11-04 ENCOUNTER — Encounter: Payer: Self-pay | Admitting: Family Medicine

## 2021-11-04 ENCOUNTER — Ambulatory Visit (INDEPENDENT_AMBULATORY_CARE_PROVIDER_SITE_OTHER): Payer: Medicare Other | Admitting: Family Medicine

## 2021-11-04 DIAGNOSIS — R7303 Prediabetes: Secondary | ICD-10-CM | POA: Diagnosis not present

## 2021-11-04 DIAGNOSIS — M8580 Other specified disorders of bone density and structure, unspecified site: Secondary | ICD-10-CM

## 2021-11-04 DIAGNOSIS — I10 Essential (primary) hypertension: Secondary | ICD-10-CM

## 2021-11-04 DIAGNOSIS — M8589 Other specified disorders of bone density and structure, multiple sites: Secondary | ICD-10-CM | POA: Diagnosis not present

## 2021-11-04 LAB — BASIC METABOLIC PANEL
BUN: 14 mg/dL (ref 6–23)
CO2: 20 mEq/L (ref 19–32)
Calcium: 9.3 mg/dL (ref 8.4–10.5)
Chloride: 107 mEq/L (ref 96–112)
Creatinine, Ser: 0.52 mg/dL (ref 0.40–1.20)
GFR: 91.3 mL/min (ref >60.00)
Glucose, Bld: 103 mg/dL — ABNORMAL HIGH (ref 70–99)
Potassium: 4.1 mEq/L (ref 3.5–5.1)
Sodium: 140 mEq/L (ref 135–145)

## 2021-11-04 LAB — VITAMIN D 25 HYDROXY (VIT D DEFICIENCY, FRACTURES): VITD: 46.09 ng/mL (ref 30.00–100.00)

## 2021-11-04 LAB — HEMOGLOBIN A1C: Hgb A1c MFr Bld: 6.1 % (ref 4.6–6.5)

## 2021-11-04 NOTE — Assessment & Plan Note (Signed)
Encouraged continued vitamin D supplementation.  Encouraged adequate dietary calcium intake and calcium supplementation.  Check vitamin D level.

## 2021-11-04 NOTE — Assessment & Plan Note (Signed)
Well-controlled at home.  She will continue lisinopril 5 mg daily.  Check labs.

## 2021-11-04 NOTE — Progress Notes (Signed)
Katherine Rumps, MD Phone: [phone]  Katherine Stanley is a 76 y.o. female who presents today for f/u.  HYPERTENSION Disease Monitoring Home BP Monitoring 115/72 Chest pain- no    Dyspnea- no Medications Compliance-  taking lisinopril.   Edema- no BMET    Component Value Date/Time   NA 137 04/19/2021 0831   K 4.2 04/19/2021 0831   CL 104 04/19/2021 0831   CO2 25 04/19/2021 0831   GLUCOSE 112 (H) 04/19/2021 0831   BUN 19 04/19/2021 0831   CREATININE 0.65 04/19/2021 0831   CALCIUM 9.8 04/19/2021 0831   GFRNONAA >60 11/21/2020 0205   GFRAA >60 03/20/2016 0432   Prediabetes: Patient notes no polyuria or polydipsia.  She golfs 3 times a week and does the elliptical at home.  She notes her diet is the culprit and she is eating too much of the wrong things.  Osteopenia: Patient takes vitamin D 1000 international units daily.  She gets calcium in her multivitamin.  She does eat yogurt most days.  Social History   Tobacco Use  Smoking Status Never  Smokeless Tobacco Never    Current Outpatient Medications on File Prior to Visit  Medication Sig Dispense Refill   acetaminophen (TYLENOL) 650 MG CR tablet Take 1,300 mg by mouth every 8 (eight) hours as needed for pain.     Calcium 250 MG CAPS Take by mouth daily.     Carboxymethylcellulose Sodium (DRY EYE RELIEF OP) Place 1 drop into both eyes daily.     cholecalciferol (VITAMIN D) 1000 units tablet Take 1,000 Units by mouth daily.     Coenzyme Q10 (COQ-10) 100 MG CAPS Take 100 mg by mouth daily.     diclofenac Sodium (VOLTAREN) 1 % GEL Apply 1 application topically 4 (four) times daily as needed (pain).     ferrous gluconate (FERGON) 240 (27 FE) MG tablet Take 240 mg by mouth daily.     Fluocinolone Acetonide Body 0.01 % OIL Apply 1 application topically 2 (two) times daily. Apply 1-2 drops to aa's ears QD-BID PRN. 120 mL 1   Krill Oil 350 MG CAPS Take 350 mg by mouth daily.     lisinopril (ZESTRIL) 10 MG tablet Take 1/2  (one-half) tablet by mouth once daily 45 tablet 3   Lysine 500 MG CAPS Take 500 mg by mouth daily as needed (fever blisters).     methocarbamol (ROBAXIN) 500 MG tablet Take 1 tablet (500 mg total) by mouth every 6 (six) hours as needed for muscle spasms. 30 tablet 1   Multiple Vitamins-Minerals (EYE VITAMINS PO) Take 1 tablet by mouth daily.     OVER THE COUNTER MEDICATION Take 1 tablet by mouth daily. Beet root supplement     Plant Sterols and Stanols (CHOLEST OFF PO) Take 1 tablet by mouth daily.     Potassium 99 MG TABS Take 99 mg by mouth daily.     Probiotic Product (PROBIOTIC DAILY PO) Take by mouth.     vitamin B-12 (CYANOCOBALAMIN) 1000 MCG tablet Take 1,000 mcg by mouth daily.     vitamin k 100 MCG tablet Take 100 mcg by mouth daily.     No current facility-administered medications on file prior to visit.     ROS see history of present illness  Objective  Physical Exam Vitals:   11/04/21 0922  BP: 138/82  Pulse: 90  Temp: 97.8 F (36.6 C)  SpO2: 95%    BP Readings from Last 3 Encounters:  11/04/21 138/82  09/20/21 122/77  07/16/21 119/82   Wt Readings from Last 3 Encounters:  11/04/21 172 lb 3.2 oz (78.1 kg)  09/20/21 170 lb 9.6 oz (77.4 kg)  07/16/21 166 lb (75.3 kg)    Physical Exam Constitutional:      General: She is not in acute distress.    Appearance: She is not diaphoretic.  Cardiovascular:     Rate and Rhythm: Normal rate and regular rhythm.     Heart sounds: Normal heart sounds.  Pulmonary:     Effort: Pulmonary effort is normal.     Breath sounds: Normal breath sounds.  Musculoskeletal:     Right lower leg: No edema.     Left lower leg: No edema.  Skin:    General: Skin is warm and dry.  Neurological:     Mental Status: She is alert.      Assessment/Plan: Please see individual problem list.  Problem List Items Addressed This Visit     Hypertension (Chronic)    Well-controlled at home.  She will continue lisinopril 5 mg daily.   Check labs.      Relevant Orders   Basic Metabolic Panel (BMET)   Osteopenia (Chronic)    Encouraged continued vitamin D supplementation.  Encouraged adequate dietary calcium intake and calcium supplementation.  Check vitamin D level.      Relevant Orders   Vitamin D (25 hydroxy)   Prediabetes (Chronic)    Check A1c.  I encouraged continued exercise.  Discussed portion control and healthier diet.      Relevant Orders   HgB A1c     Return in about 6 months (around 05/07/2022) for Hypertension/prediabetes.   Katherine Rumps, MD [place]

## 2021-11-04 NOTE — Assessment & Plan Note (Signed)
Check A1c.  I encouraged continued exercise.  Discussed portion control and healthier diet.

## 2021-11-04 NOTE — Patient Instructions (Signed)
Nice to see you. We will get lab work today and contact you with the results. Please try to reduce your portion sizes and make sure you eat lean meats and plenty of fruits and vegetables to help with your weight and prediabetes.

## 2021-11-18 ENCOUNTER — Ambulatory Visit (INDEPENDENT_AMBULATORY_CARE_PROVIDER_SITE_OTHER): Payer: Medicare Other | Admitting: Dermatology

## 2021-11-18 DIAGNOSIS — L0100 Impetigo, unspecified: Secondary | ICD-10-CM

## 2021-11-18 MED ORDER — MUPIROCIN 2 % EX OINT: TOPICAL_OINTMENT | CUTANEOUS | 1 refills | Status: DC

## 2021-11-18 MED ORDER — DOXYCYCLINE MONOHYDRATE 100 MG PO TABS: 100.0000 mg | ORAL_TABLET | Freq: Two times a day (BID) | ORAL | 0 refills | Status: AC

## 2021-11-18 NOTE — Progress Notes (Signed)
   Follow-Up Visit   Subjective  Katherine Stanley is a 76 y.o. female who presents for the following: Skin Problem (Patient here today for a spot at nose. She said it came up just over a week ago and relieved it, drained pus. She still has some soreness at inside of nose. ).   The following portions of the chart were reviewed this encounter and updated as appropriate:       Review of Systems:  No other skin or systemic complaints except as noted in HPI or Assessment and Plan.  Objective  Well appearing patient in no apparent distress; mood and affect are within normal limits.  A focused examination was performed including face. Relevant physical exam findings are noted in the Assessment and Plan.  Nose Crusted macules at nasal tip x 2    Assessment & Plan  Impetigo Nose  Vs Rosacea flare  Start mupirocin twice daily to affected area and to inside of nose.  Start doxycycline monohydrate 100 mg twice daily with food x 2 weeks.  Doxycycline should be taken with food to prevent nausea. Do not lay down for 30 minutes after taking. Be cautious with sun exposure and use good sun protection while on this medication. Pregnant women should not take this medication.    mupirocin ointment (BACTROBAN) 2 % - Nose Apply to affected areas and inside nose twice daily  doxycycline (ADOXA) 100 MG tablet - Nose Take 1 tablet (100 mg total) by mouth 2 (two) times daily for 14 days. Take with food   Return for as scheduled.  Documentation: I have reviewed the above documentation for accuracy and completeness, and I agree with the above.  Brendolyn Patty MD

## 2021-11-18 NOTE — Patient Instructions (Signed)
Start mupirocin twice daily to affected area and to inside of nose.  Start doxycycline monohydrate 100 mg twice daily with food x 2 weeks.  Doxycycline should be taken with food to prevent nausea. Do not lay down for 30 minutes after taking. Be cautious with sun exposure and use good sun protection while on this medication. Pregnant women should not take this medication.   Due to recent changes in healthcare laws, you may see results of your pathology and/or laboratory studies on MyChart before the doctors have had a chance to review them. We understand that in some cases there may be results that are confusing or concerning to you. Please understand that not all results are received at the same time and often the doctors may need to interpret multiple results in order to provide you with the best plan of care or course of treatment. Therefore, we ask that you please give [place] 2 business days to thoroughly review all your results before contacting the office for clarification. Should we see a critical lab result, you will be contacted sooner.   If You Need Anything After Your Visit  If you have any questions or concerns for your doctor, please call our main line at [phone] and press option 4 to reach your doctor's medical assistant. If no one answers, please leave a voicemail as directed and we will return your call as soon as possible. Messages left after 4 pm will be answered the following business day.   You may also send [place] a message via [place]. We typically respond to MyChart messages within 1-2 business days.  For prescription refills, please ask your pharmacy to contact our office. Our fax number is [phone].  If you have an urgent issue when the clinic is closed that cannot wait until the next business day, you can page your doctor at the number below.    Please note that while we do our best to be available for urgent issues outside of office hours, we are not available 24/7.    If you have an urgent issue and are unable to reach [place], you may choose to seek medical care at your doctor's office, retail clinic, urgent care center, or emergency room.  If you have a medical emergency, please immediately call 911 or go to the emergency department.  Pager Numbers  - Dr. Nehemiah Massed: [phone]  - Dr. Laurence Ferrari: [phone]  - Dr. Nicole Kindred: [phone]  In the event of inclement weather, please call our main line at [phone] for an update on the status of any delays or closures.  Dermatology Medication Tips: Please keep the boxes that topical medications come in in order to help keep track of the instructions about where and how to use these. Pharmacies typically print the medication instructions only on the boxes and not directly on the medication tubes.   If your medication is too expensive, please contact our office at [phone] option 4 or send [place] a message through [place].   We are unable to tell what your co-pay for medications will be in advance as this is different depending on your insurance coverage. However, we may be able to find a substitute medication at lower cost or fill out paperwork to get insurance to cover a needed medication.   If a prior authorization is required to get your medication covered by your insurance company, please allow [place] 1-2 business days to complete this process.  Drug prices often vary depending on where the prescription is filled and  some pharmacies may offer cheaper prices.  The website www.goodrx.com contains coupons for medications through different pharmacies. The prices here do not account for what the cost may be with help from insurance (it may be cheaper with your insurance), but the website can give you the price if you did not use any insurance.  - You can print the associated coupon and take it with your prescription to the pharmacy.  - You may also stop by our office during regular business hours and pick up a  GoodRx coupon card.  - If you need your prescription sent electronically to a different pharmacy, notify our office through [facility] or by phone at [phone] option 4.     Si Usted Necesita Algo Despus de Su Visita  Tambin puede enviarnos un mensaje a travs de Pharmacist, community. Por lo general respondemos a los mensajes de MyChart en el transcurso de 1 a 2 das hbiles.  Para renovar recetas, por favor pida a su farmacia que se ponga en contacto con nuestra oficina. Harland Dingwall de fax es [place] [phone].  Si tiene un asunto urgente cuando la clnica est cerrada y que no puede esperar hasta el siguiente da hbil, puede llamar/localizar a su doctor(a) al nmero que aparece a continuacin.   Por favor, tenga en cuenta que aunque hacemos todo lo posible para estar disponibles para asuntos urgentes fuera del horario de [place], no estamos disponibles las 24 horas del da, los 7 das de la [place].   Si tiene un problema urgente y no puede comunicarse con nosotros, puede optar por buscar atencin mdica  en el consultorio de su doctor(a), en una clnica privada, en un centro de atencin urgente o en una sala de emergencias.  Si tiene Engineering geologist, por favor llame inmediatamente al 911 o vaya a la sala de emergencias.  Nmeros de bper  - Dr. Nehemiah Massed: [phone]  - Dra. Moye: [phone]  - Dra. Nicole Kindred: [phone]  En caso de inclemencias del [place], por favor llame a Johnsie Kindred principal al [phone] para una actualizacin sobre el [place] de cualquier retraso o cierre.  Consejos para la medicacin en dermatologa: Por favor, guarde las cajas en las que vienen los medicamentos de uso tpico para ayudarle a seguir las instrucciones sobre dnde y cmo usarlos. Las farmacias generalmente imprimen las instrucciones del medicamento slo en las cajas y no directamente en los tubos del [place].   Si su medicamento es muy caro, por favor, pngase en contacto con  Zigmund Daniel llamando al [phone] y presione la opcin 4 o envenos un mensaje a travs de Pharmacist, community.   No podemos decirle cul ser su copago por los medicamentos por adelantado ya que esto es diferente dependiendo de la cobertura de su seguro. Sin embargo, es posible que podamos encontrar un medicamento sustituto a Electrical engineer un formulario para que el seguro cubra el medicamento que se considera necesario.   Si se requiere una autorizacin previa para que su compaa de seguros [place] su medicamento, por favor permtanos de 1 a 2 das hbiles para completar este proceso.  Los precios de los medicamentos varan con frecuencia dependiendo del Environmental consultant de dnde se surte la receta y alguna farmacias pueden ofrecer precios ms baratos.  El sitio web www.goodrx.com tiene cupones para medicamentos de Airline pilot. Los precios aqu no tienen en cuenta lo que podra costar con la ayuda del seguro (puede ser ms barato con su seguro), pero el sitio web puede darle el precio  si no utiliz [employer].  - Puede imprimir el cupn correspondiente y llevarlo con su receta a la farmacia.  - Tambin puede pasar por nuestra oficina durante el horario de atencin regular y Charity fundraiser una tarjeta de cupones de GoodRx.  - Si necesita que su receta se enve electrnicamente a una farmacia diferente, informe a nuestra oficina a travs de MyChart de [place] o por telfono llamando al [phone] y presione la opcin 4.

## 2021-12-20 ENCOUNTER — Ambulatory Visit
Admission: RE | Admit: 2021-12-20 | Discharge: 2021-12-20 | Disposition: A | Discharge status: Home / Self Care | Payer: Medicare Other | Source: Ambulatory Visit | Attending: Family Medicine | Admitting: Family Medicine

## 2021-12-20 DIAGNOSIS — Z1231 Encounter for screening mammogram for malignant neoplasm of breast: Secondary | ICD-10-CM | POA: Diagnosis not present

## 2022-01-15 ENCOUNTER — Ambulatory Visit (INDEPENDENT_AMBULATORY_CARE_PROVIDER_SITE_OTHER): Payer: Medicare Other

## 2022-01-15 ENCOUNTER — Ambulatory Visit: Payer: Self-pay

## 2022-01-15 ENCOUNTER — Encounter: Payer: Self-pay | Admitting: Orthopaedic Surgery

## 2022-01-15 ENCOUNTER — Ambulatory Visit (INDEPENDENT_AMBULATORY_CARE_PROVIDER_SITE_OTHER): Payer: Medicare Other | Admitting: Sports Medicine

## 2022-01-15 DIAGNOSIS — M25561 Pain in right knee: Secondary | ICD-10-CM | POA: Diagnosis not present

## 2022-01-15 DIAGNOSIS — M25551 Pain in right hip: Secondary | ICD-10-CM

## 2022-01-15 DIAGNOSIS — M1611 Unilateral primary osteoarthritis, right hip: Secondary | ICD-10-CM | POA: Insufficient documentation

## 2022-01-15 DIAGNOSIS — Z96651 Presence of right artificial knee joint: Secondary | ICD-10-CM | POA: Diagnosis not present

## 2022-01-15 MED ORDER — METHYLPREDNISOLONE ACETATE 40 MG/ML IJ SUSP
80.0000 mg | INTRAMUSCULAR | Status: AC | PRN
  Administered 2022-01-15: 80 mg via INTRA_ARTICULAR

## 2022-01-15 MED ORDER — LIDOCAINE HCL 1 % IJ SOLN
4.0000 mL | INTRAMUSCULAR | Status: AC | PRN
  Administered 2022-01-15: 4 mL

## 2022-01-15 NOTE — Progress Notes (Signed)
   Procedure Note  Patient: Katherine Stanley             Date of Birth: [birth year]           MRN: [id number]             Visit Date: 01/15/2022  Procedures: Visit Diagnoses:  1. Pain of right hip   2. Acute pain of right knee   3. Unilateral primary osteoarthritis, right hip   4. S/P total knee replacement using cement, right    Large Joint Inj: R hip joint on 01/15/2022 10:07 AM Indications: pain Details: 22 G 3.5 in needle, ultrasound-guided anterior approach Medications: 4 mL lidocaine 1 %; 80 mg methylPREDNISolone acetate 40 MG/ML Outcome: tolerated well, no immediate complications  Procedure: US-guided intra-articular hip injection, right After discussion on risks/benefits/indications and informed verbal consent was obtained, a timeout was performed. Patient was lying supine on exam table. The hip was cleaned with betadine and alcohol swabs. Then utilizing ultrasound guidance, the patient's femoral head and neck junction was identified and subsequently injected with 4:2 lidocaine:depomedrol via an in-plane approach with ultrasound visualization of the injectate administered into the hip joint. Patient tolerated procedure well without immediate complications.  Procedure, treatment alternatives, risks and benefits explained, specific risks discussed. Consent was given by the patient. Immediately prior to procedure a time out was called to verify the correct patient, procedure, equipment, support staff and site/side marked as required. Patient was prepped and draped in the usual sterile fashion.    - I evaluated the patient about 10 minutes post-injection and they had improvement in pain and range of motion - follow-up with Dr. Durward Fortes as indicated; I am happy to see them as needed  Elba Barman, DO [place]  This note was dictated using Dragon naturally speaking software and may contain errors in syntax, spelling,  or content which have not been identified prior to signing this note.

## 2022-01-15 NOTE — Progress Notes (Signed)
Office Visit Note   Patient: Katherine Stanley           Date of Birth: [birth year]           MRN: [id number] Visit Date: 01/15/2022              Requested by: [name], MD [address] [place],  [place] [address] PCP: [name], MD   Assessment & Plan: Visit Diagnoses:  1. Pain of right hip   2. Acute pain of right knee   3. Unilateral primary osteoarthritis, right hip   4. S/P total knee replacement using cement, right     Plan: Katherine Stanley is a pleasant 76 year old woman who is approximately 6 years start is post right total knee arthroplasty.  She is about a year status post left total hip arthroplasty.  2 to 3 weeks ago she was playing golf and stepped in a sprinkler and fell onto her right side.  She was able to ambulate afterwards.  She has noticed some pain in her right hip and her right knee since.  She denies any ecchymosis or significant swelling at the time.  X-rays were taken today which note no damage or periprosthetic fractures to the right knee replacement.  Her exam is benign she has good strength.  She does have moderate arthritis in her right hip.  With manipulation of her hip she does reproduce pain in her groin.  Believe that the pain she is experiencing is more likely coming from her hip and the arthritis.  She will undergo an ultrasound-guided injection into the hip today.  Follow-Up Instructions: No follow-ups on file.   Orders:  Orders Placed This Encounter  Procedures   XR HIP UNILAT W OR W/O PELVIS 2-3 VIEWS RIGHT   XR KNEE 3 VIEW RIGHT   US Guided Needle Placement - No Linked Charges   No orders of the defined types were placed in this encounter.     Procedures: No procedures performed   Clinical Data: No additional findings.   Subjective: Chief Complaint  Patient presents with   Right Hip - Pain   Right Knee - Pain    HPI Katherine Stanley is a pleasant 76 year old woman who is couple weeks status post stepping  into a sprinkler wall playing golf falling onto her right side.  She was able to self ambulate afterwards did not have any significant swelling or bruising.  She has noted some right knee and right hip pain she is status post right knee replacement  Review of Systems  All other systems reviewed and are negative.    Objective: Vital Signs: There were no vitals taken for this visit.  Physical Exam Constitutional:      Appearance: Normal appearance.  Pulmonary:     Effort: Pulmonary effort is normal.  Skin:    General: Skin is warm and dry.  Neurological:     General: No focal deficit present.     Mental Status: She is alert.     Ortho Exam Examination of her right knee she has no erythema no effusion no warmth.  She has a well-healed surgical incision.  Compartments are soft and nontender.  She has no particular pain with range of motion.  She has internal and external rotation of her hip reproduces pain in the groin radiating distally.  Distal neurovascular intact Specialty Comments:  No specialty comments available.  Imaging: XR KNEE 3 VIEW RIGHT  Result Date: 01/15/2022 Radiographic  films taken of her right knee in 3 projections demonstrate findings consistent status post right total knee arthroplasty.  She has a good cement mantle no periprosthetic fractures are noted.  Well-maintained alignment.  No lysis around the components  XR HIP UNILAT W OR W/O PELVIS 2-3 VIEWS RIGHT  Result Date: 01/15/2022 Radiographs of her right hip were reviewed today in multiple projections.  She has well-maintained alignment of the femoral head and the acetabulum.  She does have joint space narrowing and sclerotic changes especially in the superior acetabulum and femoral head.  Consistent with moderate arthritis.  No fractures noted    PMFS History: Patient Active Problem List   Diagnosis Date Noted   Unilateral primary osteoarthritis, right hip 01/15/2022   Status post total replacement of  left hip 11/20/2020   Unilateral primary osteoarthritis, left hip 08/08/2020   Unilateral primary osteoarthritis, left knee 09/13/2019   Discomfort of back 04/20/2019   Prediabetes 03/19/2018   Unilateral primary osteoarthritis, right knee 03/18/2016   S/P total knee replacement using cement, right 03/18/2016   Seborrheic keratoses 08/28/2014   Osteopenia 08/23/2012   Postmenopausal estrogen deficiency 07/19/2012   Hypertension 10/27/2011   Rosacea    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    Arthritis    Diverticulosis    Headache    occasionally   Hypertension    takes Lisinopril daily   Joint pain    Osteopenia    PONV (postoperative nausea and vomiting)    Slight nausea   Rosacea     Family History  Problem Relation Age of Onset   Hypertension Mother    Heart disease Mother    Kidney disease Mother    Diabetes Father    Hypertension Father    Heart disease Father    Heart disease Brother    Cancer Maternal Aunt    Ovarian cancer Maternal Aunt 85   Stroke Brother    Alzheimer's disease Brother    Diabetes Paternal Grandmother    Breast cancer Neg Hx     Past Surgical History:  Procedure Laterality Date   CATARACT EXTRACTION W/PHACO Left 07/02/2021   Procedure: CATARACT EXTRACTION PHACO AND INTRAOCULAR LENS PLACEMENT ([place]) LEFT EYHANCE 6.84 00:46.3;  Surgeon: Birder Robson, MD;  Location: [facility];  Service: Ophthalmology;  Laterality: Left;   CATARACT EXTRACTION W/PHACO Right 07/16/2021   Procedure: CATARACT EXTRACTION PHACO AND INTRAOCULAR LENS PLACEMENT ([facility]) RIGHT EYHANCE 5.89 00:51.9;  Surgeon: Birder Robson, MD;  Location: [facility];  Service: Ophthalmology;  Laterality: Right;   CHOLECYSTECTOMY     CHOLECYSTECTOMY     COLONOSCOPY     COLONOSCOPY WITH PROPOFOL N/A 11/25/2019   Procedure: COLONOSCOPY WITH PROPOFOL;  Surgeon: Virgel Manifold, MD;  Location: [facility]  ENDOSCOPY;  Service: Gastroenterology;  Laterality: N/A;   EYE SURGERY  2013   LASIK   REFRACTIVE SURGERY     SHOULDER ARTHROSCOPY WITH OPEN ROTATOR CUFF REPAIR AND DISTAL CLAVICLE ACROMINECTOMY Right 09/19/2014   Procedure: SHOULDER ARTHROSCOPY WITH MINI-OPEN ROTATOR CUFF REPAIR AND DISTAL CLAVICLE RESECTION, SUBACROMIAL DECOMPRESSION;  Surgeon: Garald Balding, MD;  Location: [facility];  Service: Orthopedics;  Laterality: Right;   TONSILLECTOMY  at age 89   TOTAL HIP ARTHROPLASTY Left 11/20/2020   Procedure: LEFT TOTAL HIP ARTHROPLASTY ANTERIOR APPROACH;  Surgeon: Mcarthur Rossetti, MD;  Location: [facility];  Service: Orthopedics;  Laterality: Left;  RNFA   TOTAL KNEE ARTHROPLASTY Right  03/18/2016   TOTAL KNEE ARTHROPLASTY Right 03/18/2016   Procedure: TOTAL KNEE ARTHROPLASTY;  Surgeon: Garald Balding, MD;  Location: [facility];  Service: Orthopedics;  Laterality: Right;   VAGINAL HYSTERECTOMY  at age 73   Social History   Occupational History   Not on file  Tobacco Use   Smoking status: Never   Smokeless tobacco: Never  Vaping Use   Vaping Use: Never used  Substance and Sexual Activity   Alcohol use: No    Alcohol/week: 0.0 standard drinks of alcohol   Drug use: No   Sexual activity: Yes    Birth control/protection: Post-menopausal, Surgical

## 2022-01-31 ENCOUNTER — Encounter: Payer: Self-pay | Admitting: Sports Medicine

## 2022-02-03 ENCOUNTER — Other Ambulatory Visit: Payer: Self-pay | Admitting: Sports Medicine

## 2022-02-03 DIAGNOSIS — M25551 Pain in right hip: Secondary | ICD-10-CM

## 2022-02-07 ENCOUNTER — Ambulatory Visit
Admission: RE | Admit: 2022-02-07 | Discharge: 2022-02-07 | Disposition: A | Discharge status: Home / Self Care | Payer: Medicare Other | Source: Ambulatory Visit | Attending: Sports Medicine | Admitting: Sports Medicine

## 2022-02-07 DIAGNOSIS — M1611 Unilateral primary osteoarthritis, right hip: Secondary | ICD-10-CM | POA: Diagnosis not present

## 2022-02-07 DIAGNOSIS — S73191A Other sprain of right hip, initial encounter: Secondary | ICD-10-CM | POA: Diagnosis not present

## 2022-02-07 DIAGNOSIS — M25551 Pain in right hip: Secondary | ICD-10-CM

## 2022-02-07 DIAGNOSIS — H04123 Dry eye syndrome of bilateral lacrimal glands: Secondary | ICD-10-CM | POA: Diagnosis not present

## 2022-02-07 DIAGNOSIS — S76311A Strain of muscle, fascia and tendon of the posterior muscle group at thigh level, right thigh, initial encounter: Secondary | ICD-10-CM | POA: Diagnosis not present

## 2022-02-07 DIAGNOSIS — S72051A Unspecified fracture of head of right femur, initial encounter for closed fracture: Secondary | ICD-10-CM | POA: Diagnosis not present

## 2022-02-10 ENCOUNTER — Ambulatory Visit (INDEPENDENT_AMBULATORY_CARE_PROVIDER_SITE_OTHER): Payer: BLUE CROSS/BLUE SHIELD

## 2022-02-10 DIAGNOSIS — Z23 Encounter for immunization: Secondary | ICD-10-CM

## 2022-02-11 ENCOUNTER — Telehealth: Payer: Self-pay

## 2022-02-11 NOTE — Telephone Encounter (Signed)
Olivia Mackie with [facility] radiology wanted to inform Dr. Rolena Infante of MRI results for patient.  Please advise.  Thank you.

## 2022-02-12 ENCOUNTER — Ambulatory Visit (INDEPENDENT_AMBULATORY_CARE_PROVIDER_SITE_OTHER): Payer: Medicare Other | Admitting: Sports Medicine

## 2022-02-12 ENCOUNTER — Encounter: Payer: Self-pay | Admitting: Sports Medicine

## 2022-02-12 DIAGNOSIS — M84451A Pathological fracture, right femur, initial encounter for fracture: Secondary | ICD-10-CM

## 2022-02-12 DIAGNOSIS — M1611 Unilateral primary osteoarthritis, right hip: Secondary | ICD-10-CM

## 2022-02-12 DIAGNOSIS — M25551 Pain in right hip: Secondary | ICD-10-CM | POA: Diagnosis not present

## 2022-02-12 DIAGNOSIS — Z96642 Presence of left artificial hip joint: Secondary | ICD-10-CM | POA: Diagnosis not present

## 2022-02-12 MED ORDER — TRAMADOL HCL 50 MG PO TABS: 50.0000 mg | ORAL_TABLET | Freq: Four times a day (QID) | ORAL | 0 refills | Status: DC | PRN

## 2022-02-12 NOTE — Progress Notes (Signed)
Katherine Stanley - 76 y.o. female MRN [id number]  Date of birth: 17-Dec-1945  Office Visit Note: Visit Date: 02/12/2022 PCP: [name], MD Referred by: [name], MD  Subjective: No chief complaint on file.  HPI: Katherine Stanley is a pleasant 76 y.o. female who presents today for follow-up right hip pain.  She is still having somewhat significant pain at the right hip, although has slowly improved.  She is stopped playing pickle ball.  Her last time playing was 01/28/22, although she is anxious to get back out there.  Given the pain has been rather severe she has been taking Tylenol and ibuprofen 2-3 times daily and attempt to help curb the pain.  Does note that she is walking with a limp.  Denies any new injury.  Since her last visit, she did obtain an MRI of the right hip which showed a subchondral insufficiency fracture of the femoral head and neck with rather extensive bone marrow edema.  Also early signs of AVN, see below.  Pertinent ROS were reviewed with the patient and found to be negative unless otherwise specified above in HPI.   Assessment & Plan: Visit Diagnoses:  1. Subchondral insufficiency fracture of condyle of right femur, initial encounter (HCC)   2. Pain of right hip   3. Unilateral primary osteoarthritis, right hip   4. Status post total replacement of left hip    Plan: Discussed with Cerina that he does have a subchondral insufficiency fracture of the right femoral head and neck.  Her radiographs and MRI do show rather advancing arthritis of the hip as well.  Discussed that we need to shut her down from activity, she will use protective weightbearing continuously with either a walker or cane.  She has both of these at home from her prior left hip replacement.  She is okay for Tylenol or over-the-counter anti-inflammatories, although given her severe pain we will provide her with 15 tablets of tramadol to be taken as needed for breakthrough pain.  I  did review her MRI with my partner, Dr. Jean Rosenthal, and she will set up appointment to discuss options for him, although I do think ultimately a total hip replacement would be the best course of action for her.  Briefly discussed these details, but she will follow-up with Dr. Ninfa Linden to further discuss.  Follow-up: Return for F/u with [facility] appt.   Meds & Orders:  Meds ordered this encounter  Medications   traMADol (ULTRAM) 50 MG tablet    Sig: Take 1 tablet (50 mg total) by mouth every 6 (six) hours as needed for up to 15 doses for severe pain.    Dispense:  15 tablet    Refill:  0   No orders of the defined types were placed in this encounter.    Procedures: No procedures performed      Clinical History: No specialty comments available.  She reports that she has never smoked. She has never used smokeless tobacco.  Recent Labs    04/19/21 0831 11/04/21 0942  HGBA1C 6.4 6.1    Objective:   Vital Signs: There were no vitals taken for this visit.  Physical Exam  Gen: Well-appearing, in no acute distress; non-toxic CV: Regular Rate. Well-perfused. Warm.  Resp: Breathing unlabored on room air; no wheezing. Psych: Fluid speech in conversation; appropriate affect; normal thought process Neuro: Sensation intact throughout. No gross coordination deficits.   Ortho Exam - Right hip: There is a irritable hip  on exam which is a palpable mechanical into pain with internal rotation.  Gross range of motion was not tested today given the acuity of her injury.  She does walk with a slightly antalgic gait.  Neurovascular intact distally.  Imaging:  *Independent review of the right hip MRI from 02/07/2022 shows a 7 chondral insufficiency fracture of the right femoral head and neck.  There is rather extensive bone marrow edema throughout this region.  Early changes of avascular necrosis.  I do not see any displaced fracture components.  MR Hip Right w/o contrast CLINICAL DATA:   Right hip pain after fall 1 month ago  EXAM: MR OF THE RIGHT HIP WITHOUT CONTRAST  TECHNIQUE: Multiplanar, multisequence MR imaging was performed. No intravenous contrast was administered.  COMPARISON:  X-ray 01/15/2022  FINDINGS: Bones: Subchondral fracture of the right femoral head superiorly with extensive bone marrow edema throughout the femoral head and neck. A developing nondisplaced fracture component through the femoral neck is not excluded (series 2, image 16). No dislocation. No femoral head avascular necrosis. Postsurgical changes from previous left hip arthroplasty with associated metallic susceptibility artifact. Bony pelvis intact without diastasis. Bilateral sacroiliac joints are partially ankylosed. Mild arthropathy at the pubic symphysis. No marrow replacing bone lesion.  Articular cartilage and labrum  Articular cartilage: Full-thickness cartilage loss along the superior aspect of the right hip joint. Reactive marrow edema within the acetabulum. A developing acetabular fracture not excluded.  Labrum:  Degenerative tearing of the superior labrum.  Joint or bursal effusion  Joint effusion:  Small right hip joint effusion, likely reactive.  Bursae: No abnormal bursal fluid collection.  Muscles and tendons  Muscles and tendons: Tendinosis and partial thickness tearing of the bilateral hamstring tendon origins, right worse than left. The gluteal, iliopsoas, rectus femoris, and adductor tendons appear intact without tear or significant tendinosis. Normal muscle bulk and signal intensity without edema, atrophy, or fatty infiltration.  Other findings  Miscellaneous: No soft tissue edema or fluid collection. No inguinal lymphadenopathy. Severe colonic diverticulosis. No acute inflammatory changes are evident within the pelvis.  IMPRESSION: 1. Subchondral fracture of the right femoral head with extensive bone marrow edema throughout the femoral head and  neck. A developing nondisplaced fracture component through the femoral neck is not excluded. 2. Moderate-to-severe right hip osteoarthritis. 3. Reactive bone marrow edema within the right acetabulum. Developing acetabular fracture not excluded. 4. Small right hip joint effusion, likely reactive. 5. Tendinosis and partial thickness tearing of the bilateral hamstring tendon origins, right worse than left. 6. Severe colonic diverticulosis.  These results will be called to the ordering clinician or representative by the Radiologist Assistant, and communication documented in the PACS or [employer].  Electronically Signed   By: Davina Poke D.O.   On: 02/11/2022 08:50    Past Medical/Family/Surgical/Social History: Medications & Allergies reviewed per EMR, new medications updated. Patient Active Problem List   Diagnosis Date Noted   Unilateral primary osteoarthritis, right hip 01/15/2022   Status post total replacement of left hip 11/20/2020   Unilateral primary osteoarthritis, left hip 08/08/2020   Unilateral primary osteoarthritis, left knee 09/13/2019   Discomfort of back 04/20/2019   Prediabetes 03/19/2018   Unilateral primary osteoarthritis, right knee 03/18/2016   S/P total knee replacement using cement, right 03/18/2016   Seborrheic keratoses 08/28/2014   Osteopenia 08/23/2012   Postmenopausal estrogen deficiency 07/19/2012   Hypertension 10/27/2011   Rosacea    Past Medical History:  Diagnosis Date   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    Arthritis    Diverticulosis    Headache    occasionally   Hypertension    takes Lisinopril daily   Joint pain    Osteopenia    PONV (postoperative nausea and vomiting)    Slight nausea   Rosacea    Family History  Problem Relation Age of Onset   Hypertension Mother    Heart disease Mother    Kidney disease Mother    Diabetes Father    Hypertension Father    Heart disease Father     Heart disease Brother    Cancer Maternal Aunt    Ovarian cancer Maternal Aunt 85   Stroke Brother    Alzheimer's disease Brother    Diabetes Paternal Grandmother    Breast cancer Neg Hx    Past Surgical History:  Procedure Laterality Date   CATARACT EXTRACTION W/PHACO Left 07/02/2021   Procedure: CATARACT EXTRACTION PHACO AND INTRAOCULAR LENS PLACEMENT ([place]) LEFT EYHANCE 6.84 00:46.3;  Surgeon: Birder Robson, MD;  Location: [facility];  Service: Ophthalmology;  Laterality: Left;   CATARACT EXTRACTION W/PHACO Right 07/16/2021   Procedure: CATARACT EXTRACTION PHACO AND INTRAOCULAR LENS PLACEMENT ([facility]) RIGHT EYHANCE 5.89 00:51.9;  Surgeon: Birder Robson, MD;  Location: [facility];  Service: Ophthalmology;  Laterality: Right;   CHOLECYSTECTOMY     CHOLECYSTECTOMY     COLONOSCOPY     COLONOSCOPY WITH PROPOFOL N/A 11/25/2019   Procedure: COLONOSCOPY WITH PROPOFOL;  Surgeon: Virgel Manifold, MD;  Location: [facility];  Service: Gastroenterology;  Laterality: N/A;   EYE SURGERY  2013   LASIK   REFRACTIVE SURGERY     SHOULDER ARTHROSCOPY WITH OPEN ROTATOR CUFF REPAIR AND DISTAL CLAVICLE ACROMINECTOMY Right 09/19/2014   Procedure: SHOULDER ARTHROSCOPY WITH MINI-OPEN ROTATOR CUFF REPAIR AND DISTAL CLAVICLE RESECTION, SUBACROMIAL DECOMPRESSION;  Surgeon: Garald Balding, MD;  Location: [place];  Service: Orthopedics;  Laterality: Right;   TONSILLECTOMY  at age 40   TOTAL HIP ARTHROPLASTY Left 11/20/2020   Procedure: LEFT TOTAL HIP ARTHROPLASTY ANTERIOR APPROACH;  Surgeon: Mcarthur Rossetti, MD;  Location: [place];  Service: Orthopedics;  Laterality: Left;  RNFA   TOTAL KNEE ARTHROPLASTY Right 03/18/2016   TOTAL KNEE ARTHROPLASTY Right 03/18/2016   Procedure: TOTAL KNEE ARTHROPLASTY;  Surgeon: Garald Balding, MD;  Location: [facility];  Service: Orthopedics;  Laterality: Right;   VAGINAL HYSTERECTOMY  at age 30   Social History   Occupational History   Not on file   Tobacco Use   Smoking status: Never   Smokeless tobacco: Never  Vaping Use   Vaping Use: Never used  Substance and Sexual Activity   Alcohol use: No    Alcohol/week: 0.0 standard drinks of alcohol   Drug use: No   Sexual activity: Yes    Birth control/protection: Post-menopausal, Surgical

## 2022-02-17 ENCOUNTER — Encounter: Payer: Self-pay | Admitting: Orthopaedic Surgery

## 2022-02-17 ENCOUNTER — Ambulatory Visit (INDEPENDENT_AMBULATORY_CARE_PROVIDER_SITE_OTHER): Payer: Medicare Other | Admitting: Orthopaedic Surgery

## 2022-02-17 DIAGNOSIS — M1611 Unilateral primary osteoarthritis, right hip: Secondary | ICD-10-CM | POA: Diagnosis not present

## 2022-02-17 DIAGNOSIS — M25551 Pain in right hip: Secondary | ICD-10-CM | POA: Diagnosis not present

## 2022-02-17 NOTE — Progress Notes (Signed)
The patient is a 76 year old female well-known to me.  She actually saw my partner Dr. Rolena Infante recently after acute pain in her right hip.  X-rays did not show any significant deformities and after steroid injection did not help her right hip joint, she was sent for an MRI of the right hip.  She has been ambulate with a quad cane in her opposite hand.  She is an avid Air cabin crew.  We actually replaced her left hip in the last year or 2 and she has had knee replacements in the past.  She is getting around well with her quad cane and does report right hip pain.  Her right hip moves smoothly and fluidly with some pain in the groin.  She tolerates range of motion but weightbearing is causing some pain but she is walking without any significant limp.  Compression of the right hip also causes pain in the groin.  The MRI is reviewed with her right hip.  There is extensive edema in the femoral head and neck.  There is a subchondral fracture of the femoral head as well and significant full-thickness cartilage loss and degenerative labral tearing.  Given the MRI findings and clinical exam findings, a right hip replacement is warranted.  Having had this before she is fully aware the risk and benefits of the surgery.  She will continue her cane and not playing golf and offloading that hip while we work on getting her set up for a hip replacement for her right hip.  She is not a diabetic and it does not look like she is on the blood thinning medications.  Having had this before again she is fully aware what to expect from an intraoperative and postoperative course.  We will work on getting her scheduled for right total hip.

## 2022-02-19 ENCOUNTER — Encounter: Payer: Self-pay | Admitting: Orthopaedic Surgery

## 2022-02-24 ENCOUNTER — Telehealth: Payer: Self-pay

## 2022-02-24 NOTE — Telephone Encounter (Signed)
Amy is calling from [facility] to state they faxed [place] a patient verification form on 01/27/2022 and they have refaxed it everyday since then.  Amy states that they have a deadline for today to have it completed.   Please fax completed form to: [phone]

## 2022-02-24 NOTE — Telephone Encounter (Signed)
Signed.  Please fax back. 

## 2022-02-24 NOTE — Telephone Encounter (Signed)
Faxed to [facility] compliance/ confirmation given.  Kerissa Coia,cma

## 2022-02-25 ENCOUNTER — Ambulatory Visit: Payer: Medicare Other | Admitting: Orthopaedic Surgery

## 2022-03-10 ENCOUNTER — Other Ambulatory Visit: Payer: Self-pay | Admitting: Family Medicine

## 2022-03-10 DIAGNOSIS — I10 Essential (primary) hypertension: Secondary | ICD-10-CM

## 2022-03-14 ENCOUNTER — Other Ambulatory Visit: Payer: Self-pay

## 2022-03-16 ENCOUNTER — Encounter: Payer: Self-pay | Admitting: Orthopaedic Surgery

## 2022-03-26 ENCOUNTER — Other Ambulatory Visit: Payer: Self-pay | Admitting: Physician Assistant

## 2022-03-26 DIAGNOSIS — Z01818 Encounter for other preprocedural examination: Secondary | ICD-10-CM

## 2022-03-31 NOTE — Pre-Procedure Instructions (Signed)
Surgical Instructions    Your procedure is scheduled on Tuesday, April 08, 2022.  Report to [facility] Main Entrance "A" at 5:30 A.M., then check in with the Admitting office.  Call this number if you have problems the morning of surgery:  [phone]   If you have any questions prior to your surgery date call [phone]: Open Monday-Friday 8am-4pm If you experience any cold or flu symptoms such as cough, fever, chills, shortness of breath, etc. between now and your scheduled surgery, please notify us at the above number     Remember:  Do not eat after midnight the night before your surgery  You may drink clear liquids until 4:30 am the morning of your surgery.   Clear liquids allowed are: Water, Non-Citrus Juices (without pulp), Carbonated Beverages, Clear Tea, Black Coffee ONLY (NO MILK, CREAM OR POWDERED CREAMER of any kind), and Gatorade   Enhanced Recovery after Surgery for Orthopedics Enhanced Recovery after Surgery is a protocol used to improve the stress on your body and your recovery after surgery.  Patient Instructions  The day of surgery (if you do NOT have diabetes):  Drink ONE (1) Pre-Surgery Clear Ensure by  4:30 am the morning of surgery   This drink was given to you during your hospital  pre-op appointment visit. Nothing else to drink after completing the  Pre-Surgery Clear Ensure.          If you have questions, please contact your surgeon's office.     Take these medicines the morning of surgery with A SIP OF WATER:    traMADol (ULTRAM)- if needed  acetaminophen (TYLENOL)- if needed   As of today, STOP taking any Aspirin (unless otherwise instructed by your surgeon) Aleve, Naproxen, Ibuprofen, Motrin, Advil, Goody's, BC's, all herbal medications, fish oil, and all vitamins and Diclofenac gel.            Do not wear jewelry or makeup. Do not wear lotions, powders, perfume or deodorant. Do not shave 48 hours prior to surgery.  Do not bring valuables  to the hospital. Do not wear nail polish, gel polish, artificial nails, or any other type of covering on natural nails (fingers and toes) If you have artificial nails or gel coating that need to be removed by a nail salon, please have this removed prior to surgery. Artificial nails or gel coating may interfere with anesthesia's ability to adequately monitor your vital signs.  [facility] is not responsible for any belongings or valuables.    Do NOT Smoke (Tobacco/Vaping)  24 hours prior to your procedure  If you use a CPAP at night, you may bring your mask for your overnight stay.   Contacts, glasses, hearing aids, dentures or partials may not be worn into surgery, please bring cases for these belongings   For patients admitted to the hospital, discharge time will be determined by your treatment team.   Patients discharged the day of surgery will not be allowed to drive home, and someone needs to stay with them for 24 hours.   SURGICAL WAITING ROOM VISITATION Patients having surgery or a procedure may have no more than 2 support people in the waiting area - these visitors may rotate.   Children under the age of 60 must have an adult with them who is not the patient. If the patient needs to stay at the hospital during part of their recovery, the visitor guidelines for inpatient rooms apply. Pre-op nurse will coordinate an appropriate time for 1  support person to accompany patient in pre-op.  This support person may not rotate.   Please refer to [email or web address] for the visitor guidelines for Inpatients (after your surgery is over and you are in a regular room).    Special instructions:    Oral Hygiene is also important to reduce your risk of infection.  Remember - BRUSH YOUR TEETH THE MORNING OF SURGERY WITH YOUR REGULAR TOOTHPASTE   [facility]- Preparing For Surgery  Before surgery, you can play an important role. Because skin is  not sterile, your skin needs to be as free of germs as possible. You can reduce the number of germs on your skin by washing with CHG (chlorahexidine gluconate) Soap before surgery.  CHG is an antiseptic cleaner which kills germs and bonds with the skin to continue killing germs even after washing.     Please do not use if you have an allergy to CHG or antibacterial soaps. If your skin becomes reddened/irritated stop using the CHG.  Do not shave (including legs and underarms) for at least 48 hours prior to first CHG shower. It is OK to shave your face.  Please follow these instructions carefully.     Shower the NIGHT BEFORE SURGERY and the MORNING OF SURGERY with CHG Soap.   If you chose to wash your hair, wash your hair first as usual with your normal shampoo. After you shampoo, rinse your hair and body thoroughly to remove the shampoo.  Then [employer] and genitals (private parts) with your normal soap and rinse thoroughly to remove soap.  After that Use CHG Soap as you would any other liquid soap. You can apply CHG directly to the skin and wash gently with a scrungie or a clean washcloth.   Apply the CHG Soap to your body ONLY FROM THE NECK DOWN.  Do not use on open wounds or open sores. Avoid contact with your eyes, ears, mouth and genitals (private parts). Wash Face and genitals (private parts)  with your normal soap.   Wash thoroughly, paying special attention to the area where your surgery will be performed.  Thoroughly rinse your body with warm water from the neck down.  DO NOT shower/wash with your normal soap after using and rinsing off the CHG Soap.  Pat yourself dry with a CLEAN TOWEL.  Wear CLEAN PAJAMAS to bed the night before surgery  Place CLEAN SHEETS on your bed the night before your surgery  DO NOT SLEEP WITH PETS.   Day of Surgery:  Take a shower with CHG soap. Wear Clean/Comfortable clothing the morning of surgery Do not apply any deodorants/lotions.   Remember  to brush your teeth WITH YOUR REGULAR TOOTHPASTE.    If you received a COVID test during your pre-op visit, it is requested that you wear a mask when out in public, stay away from anyone that may not be feeling well, and notify your surgeon if you develop symptoms. If you have been in contact with anyone that has tested positive in the last 10 days, please notify your surgeon.    Please read over the following fact sheets that you were given.

## 2022-04-01 ENCOUNTER — Other Ambulatory Visit: Payer: Self-pay

## 2022-04-01 ENCOUNTER — Encounter (HOSPITAL_COMMUNITY)
Admission: RE | Admit: 2022-04-01 | Discharge: 2022-04-01 | Disposition: A | Discharge status: Home / Self Care | Payer: Medicare Other | Source: Ambulatory Visit | Attending: Orthopaedic Surgery | Admitting: Orthopaedic Surgery

## 2022-04-01 ENCOUNTER — Encounter (HOSPITAL_COMMUNITY): Payer: Self-pay

## 2022-04-01 VITALS — BP 158/97 | HR 105 | Temp 98.1°F | Resp 18 | Ht 60.0 in | Wt 169.4 lb

## 2022-04-01 DIAGNOSIS — Z01818 Encounter for other preprocedural examination: Secondary | ICD-10-CM | POA: Diagnosis not present

## 2022-04-01 DIAGNOSIS — I517 Cardiomegaly: Secondary | ICD-10-CM | POA: Diagnosis not present

## 2022-04-01 LAB — TYPE AND SCREEN
ABO/RH(D): O POS
Antibody Screen: NEGATIVE

## 2022-04-01 LAB — COMPREHENSIVE METABOLIC PANEL
ALT: 16 U/L (ref 0–44)
AST: 18 U/L (ref 15–41)
Albumin: 3.9 g/dL (ref 3.5–5.0)
Alkaline Phosphatase: 69 U/L (ref 38–126)
Anion gap: 8 (ref 5–15)
BUN: 17 mg/dL (ref 8–23)
CO2: 19 mmol/L — ABNORMAL LOW (ref 22–32)
Calcium: 9.7 mg/dL (ref 8.9–10.3)
Chloride: 113 mmol/L — ABNORMAL HIGH (ref 98–111)
Creatinine, Ser: 0.57 mg/dL (ref 0.44–1.00)
GFR, Estimated: >60 mL/min (ref >60)
Glucose, Bld: 116 mg/dL — ABNORMAL HIGH (ref 70–99)
Potassium: 3.8 mmol/L (ref 3.5–5.1)
Sodium: 140 mmol/L (ref 135–145)
Total Bilirubin: 0.6 mg/dL (ref 0.3–1.2)
Total Protein: 6.8 g/dL (ref 6.5–8.1)

## 2022-04-01 LAB — CBC
HCT: 37.8 % (ref 36.0–46.0)
Hemoglobin: 13 g/dL (ref 12.0–15.0)
MCH: 32.3 pg (ref 26.0–34.0)
MCHC: 34.4 g/dL (ref 30.0–36.0)
MCV: 93.8 fL (ref 80.0–100.0)
Platelets: 248 10*3/uL (ref 150–400)
RBC: 4.03 MIL/uL (ref 3.87–5.11)
RDW: 12.9 % (ref 11.5–15.5)
WBC: 10.9 10*3/uL — ABNORMAL HIGH (ref 4.0–10.5)
nRBC: 0 % (ref 0.0–0.2)

## 2022-04-01 LAB — SURGICAL PCR SCREEN
MRSA, PCR: NEGATIVE
Staphylococcus aureus: NEGATIVE

## 2022-04-01 NOTE — Progress Notes (Addendum)
PCP - [name], MD Cardiologist - denies  PPM/ICD - denies Device Orders - n/a Rep Notified - n/a  Chest x-ray - n/a EKG - 04/01/22 Stress Test - denies ECHO - 05/19/16 Cardiac Cath - denies  Sleep Study - denies CPAP - n/a  Fasting Blood Sugar - n/a  Blood Thinner Instructions: n/a Aspirin Instructions: Patient was instructed: As of today, STOP taking any Aspirin (unless otherwise instructed by your surgeon) Aleve, Naproxen, Ibuprofen, Motrin, Advil, Goody's, BC's, all herbal medications, fish oil, and all vitamins and Diclofenac gel.    ERAS Protcol - yes, until 04:30 o'clock PRE-SURGERY Ensure - yes  COVID TEST- n/a   Anesthesia review: yes - review EKG  Patient denies shortness of breath, fever, cough and chest pain at PAT appointment   All instructions explained to the patient, with a verbal understanding of the material. Patient agrees to go over the instructions while at home for a better understanding. Patient also instructed to self quarantine after being tested for COVID-19. The opportunity to ask questions was provided.

## 2022-04-03 ENCOUNTER — Telehealth: Payer: Self-pay | Admitting: *Deleted

## 2022-04-03 NOTE — Care Plan (Signed)
[facility] RNCM call to patient to discuss her upcoming Right total hip with Dr. Ninfa Linden on 04/08/22. She previously has had her Left total hip done a little over a year ago and doing well from this. She is an Ortho bundle patient through [facility] and is agreeable to case management. She lives with her husband, who will be assisting her at home after surgery. She has a RW, 3in1/BSC, and cane. No DME needed. Anticipate she may not need HHPT as she states she is familiar with all exercises. Will make referral to [facility] to have on stand-by after surgery in case she feels she does need from [facility]. Choice provided. Reviewed post op care instructions. Will continue to follow for needs.

## 2022-04-03 NOTE — Telephone Encounter (Signed)
Ortho bundle pre-op call completed. 

## 2022-04-07 NOTE — Anesthesia Preprocedure Evaluation (Signed)
Anesthesia Evaluation  Patient identified by MRN, date of birth, ID band Patient awake    Reviewed: Allergy & Precautions, NPO status , Patient's Chart, lab work & pertinent test results  History of Anesthesia Complications (+) PONV and history of anesthetic complications  Airway Mallampati: II  TM Distance: >3 FB Neck ROM: Full    Dental  (+) Dental Advisory Given, Teeth Intact   Pulmonary neg pulmonary ROS   Pulmonary exam normal        Cardiovascular hypertension, Pt. on medications Normal cardiovascular exam     Neuro/Psych  Headaches  negative psych ROS   GI/Hepatic negative GI ROS, Neg liver ROS,,,  Endo/Other  negative endocrine ROS    Renal/GU negative Renal ROS     Musculoskeletal  (+) Arthritis ,    Abdominal   Peds  Hematology negative hematology ROS (+)  Plt 248k    Anesthesia Other Findings   Reproductive/Obstetrics                             Anesthesia Physical Anesthesia Plan  ASA: 3  Anesthesia Plan: Spinal   Post-op Pain Management: Tylenol PO (pre-op)*   Induction:   PONV Risk Score and Plan: 3 and Treatment may vary due to age or medical condition and Propofol infusion  Airway Management Planned: Natural Airway and Simple Face Mask  Additional Equipment: None  Intra-op Plan:   Post-operative Plan:   Informed Consent: I have reviewed the patients History and Physical, chart, labs and discussed the procedure including the risks, benefits and alternatives for the proposed anesthesia with the patient or authorized representative who has indicated his/her understanding and acceptance.       Plan Discussed with: CRNA and Anesthesiologist  Anesthesia Plan Comments: (Labs reviewed, platelets acceptable. Discussed risks and benefits of spinal, including spinal/epidural hematoma, infection, failed block, and PDPH. Patient expressed understanding and wished  to proceed. )       Anesthesia Quick Evaluation

## 2022-04-07 NOTE — H&P (Signed)
TOTAL HIP ADMISSION H&P  Patient is admitted for right total hip arthroplasty.  Subjective:  Chief Complaint: right hip pain  HPI: Katherine Stanley, 76 y.o. female, has a history of pain and functional disability in the right hip(s) due to arthritis and patient has failed non-surgical conservative treatments for greater than 12 weeks to include NSAID's and/or analgesics, corticosteriod injections, use of assistive devices, and activity modification.  Onset of symptoms was abrupt starting several months ago with rapidlly worsening course since that time.The patient noted no past surgery on the right hip(s).  Patient currently rates pain in the right hip at 10 out of 10 with activity. Patient has night pain, worsening of pain with activity and weight bearing, pain that interfers with activities of daily living, and pain with passive range of motion. Patient has evidence of  subchondral edema and subchondral fracture  by imaging studies. This condition presents safety issues increasing the risk of falls.  There is no current active infection.  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    Arthritis    Diverticulosis    Headache    occasionally   Hypertension    takes Lisinopril daily   Joint pain    Osteopenia    PONV (postoperative nausea and vomiting)    Slight nausea   Rosacea     Past  Surgical History:  Procedure Laterality Date   CATARACT EXTRACTION W/PHACO Left 07/02/2021   Procedure: CATARACT EXTRACTION PHACO AND INTRAOCULAR LENS PLACEMENT ([facility]) LEFT EYHANCE 6.84 00:46.3;  Surgeon: Birder Robson, MD;  Location: [facility];  Service: Ophthalmology;  Laterality: Left;   CATARACT EXTRACTION W/PHACO Right 07/16/2021   Procedure: CATARACT EXTRACTION PHACO AND INTRAOCULAR LENS PLACEMENT ([place]) RIGHT EYHANCE 5.89 00:51.9;  Surgeon: Birder Robson, MD;  Location: [facility];  Service: Ophthalmology;  Laterality: Right;   CHOLECYSTECTOMY     CHOLECYSTECTOMY     COLONOSCOPY     COLONOSCOPY WITH PROPOFOL N/A 11/25/2019   Procedure: COLONOSCOPY WITH PROPOFOL;  Surgeon: Virgel Manifold, MD;  Location: [facility];  Service: Gastroenterology;  Laterality: N/A;   EYE SURGERY  2013   LASIK   JOINT REPLACEMENT     REFRACTIVE SURGERY     SHOULDER ARTHROSCOPY WITH OPEN ROTATOR CUFF REPAIR AND DISTAL CLAVICLE ACROMINECTOMY Right 09/19/2014   Procedure: SHOULDER ARTHROSCOPY WITH MINI-OPEN ROTATOR CUFF REPAIR AND DISTAL CLAVICLE RESECTION, SUBACROMIAL DECOMPRESSION;  Surgeon: Garald Balding, MD;  Location: [place];  Service: Orthopedics;  Laterality: Right;   TONSILLECTOMY  at age 47   TOTAL HIP ARTHROPLASTY Left 11/20/2020   Procedure: LEFT TOTAL HIP ARTHROPLASTY ANTERIOR APPROACH;  Surgeon: Mcarthur Rossetti, MD;  Location: [place];  Service: Orthopedics;  Laterality: Left;  RNFA   TOTAL KNEE ARTHROPLASTY Right 03/18/2016   TOTAL KNEE ARTHROPLASTY Right 03/18/2016   Procedure: TOTAL KNEE ARTHROPLASTY;  Surgeon: Garald Balding, MD;  Location: [place];  Service: Orthopedics;  Laterality: Right;   VAGINAL HYSTERECTOMY  at age 39    No current facility-administered medications for this encounter.   Current Outpatient Medications  Medication Sig Dispense Refill Last Dose   acetaminophen (TYLENOL) 650 MG CR tablet Take 1,300 mg by mouth every 8 (eight)  hours as needed for pain.      Calcium 250 MG CAPS Take 250 mg by mouth daily.      cholecalciferol (VITAMIN D) 1000 units tablet Take 1,000 Units by mouth daily.      Coenzyme Q10 (COQ-10) 100 MG CAPS Take 100 mg by mouth daily.      diclofenac Sodium (VOLTAREN) 1 % GEL Apply 1 application topically 4 (four) times daily as needed (pain).      ferrous gluconate (FERGON) 240 (27 FE) MG tablet Take 240 mg by mouth daily.      Krill Oil 350 MG CAPS Take 350 mg by mouth daily.      lisinopril (ZESTRIL) 10 MG tablet Take 1/2 (one-half) tablet by mouth once daily 45 tablet 0    Lysine 500 MG CAPS Take 500 mg by mouth daily as needed (fever blisters).      Multiple Vitamin (MULTIVITAMIN WITH MINERALS) TABS tablet Take 1 tablet by mouth daily.      Multiple Vitamins-Minerals (PRESERVISION AREDS 2) CAPS Take 1 capsule by mouth daily.      OVER THE COUNTER MEDICATION Take 1 tablet by mouth daily. Beet root supplement      Plant Sterols and Stanols (CHOLEST OFF PO) Take 1 tablet by mouth daily.      Potassium 99 MG TABS Take 99 mg by mouth daily.      vitamin B-12 (CYANOCOBALAMIN) 1000 MCG tablet Take 1,000 mcg by mouth daily.      White Petrolatum-Mineral Oil (RETAINE PM) OINT Place 1 Application into both eyes See admin instructions. Apply to both eyes every morning, may use again as needed for dryness      mupirocin ointment (BACTROBAN) 2 % Apply to affected areas and inside nose twice daily (Patient not taking: Reported on 03/27/2022) 22 g 1 Not Taking   traMADol (ULTRAM) 50 MG tablet Take 1 tablet (50 mg total) by mouth every 6 (six) hours as needed for up to 15 doses for severe pain. (Patient not taking: Reported on 03/27/2022) 15 tablet 0 Completed Course   Allergies  Allergen Reactions   Alendronate Other (See Comments)    Diffuse myalgia and arthralgia   Aleve [Naproxen Sodium] Nausea And Vomiting    Sick to stomach    Social History   Tobacco Use   Smoking status: Never   Smokeless  tobacco: Never  Substance Use Topics   Alcohol use: No    Alcohol/week: 0.0 standard drinks of alcohol    Family History  Problem Relation Age of Onset   Hypertension Mother    Heart disease Mother    Kidney disease Mother    Diabetes Father    Hypertension Father    Heart disease Father    Heart disease Brother    Cancer Maternal Aunt    Ovarian cancer Maternal Aunt 85   Stroke Brother    Alzheimer's disease Brother    Diabetes Paternal Grandmother    Breast cancer Neg Hx      Review of Systems  All other systems reviewed and are negative.   Objective:  Physical Exam Vitals reviewed.  Constitutional:      Appearance: Normal appearance. She is normal weight.  HENT:     Head: Normocephalic and atraumatic.  Eyes:     Extraocular Movements: Extraocular movements intact.     Pupils: Pupils are equal, round, and reactive to light.  Cardiovascular:     Rate and Rhythm: Normal rate.     Pulses: Normal pulses.  Pulmonary:     Effort: Pulmonary effort is normal.     Breath sounds: Normal breath sounds.  Abdominal:     Palpations: Abdomen is soft.  Musculoskeletal:     Cervical back: Normal range of motion and neck supple.     Right hip: Tenderness and bony tenderness present. Decreased range of motion. Decreased strength.  Neurological:     Mental Status: She is alert and oriented to person, place, and time.  Psychiatric:        Behavior: Behavior normal.     Vital signs in last 24 hours:    Labs:   Estimated body mass index is 33.08 kg/m as calculated from the following:   Height as of 04/01/22: 5' (1.524 m).   Weight as of 04/01/22: 76.8 kg.   Imaging Review MRI of the right hip demonstrates severe degenerative joint disease of the right hip(s). The bone quality appears to be good for age and reported activity level.      Assessment/Plan:  End stage arthritis, right hip(s)  The patient history, physical examination, clinical judgement of the  provider and imaging studies are consistent with end stage degenerative joint disease of the right hip(s) and total hip arthroplasty is deemed medically necessary. The treatment options including medical management, injection therapy, arthroscopy and arthroplasty were discussed at length. The risks and benefits of total hip arthroplasty were presented and reviewed. The risks due to aseptic loosening, infection, stiffness, dislocation/subluxation,  thromboembolic complications and other imponderables were discussed.  The patient acknowledged the explanation, agreed to proceed with the plan and consent was signed. Patient is being admitted for inpatient treatment for surgery, pain control, PT, OT, prophylactic antibiotics, VTE prophylaxis, progressive ambulation and ADL's and discharge planning.The patient is planning to be discharged home with home health services

## 2022-04-08 ENCOUNTER — Ambulatory Visit (HOSPITAL_BASED_OUTPATIENT_CLINIC_OR_DEPARTMENT_OTHER): Payer: Medicare Other | Admitting: Physician Assistant

## 2022-04-08 ENCOUNTER — Observation Stay (HOSPITAL_COMMUNITY)
Admission: RE | Admit: 2022-04-08 | Discharge: 2022-04-09 | Disposition: A | Discharge status: Home / Self Care | Payer: Medicare Other | Attending: Orthopaedic Surgery | Admitting: Orthopaedic Surgery

## 2022-04-08 ENCOUNTER — Encounter (HOSPITAL_COMMUNITY)
Admission: RE | Disposition: A | Discharge status: Home / Self Care | Payer: Self-pay | Source: Home / Self Care | Attending: Orthopaedic Surgery

## 2022-04-08 ENCOUNTER — Ambulatory Visit (HOSPITAL_COMMUNITY): Payer: Medicare Other | Admitting: Physician Assistant

## 2022-04-08 ENCOUNTER — Other Ambulatory Visit: Payer: Self-pay

## 2022-04-08 ENCOUNTER — Ambulatory Visit (HOSPITAL_COMMUNITY): Payer: Medicare Other

## 2022-04-08 ENCOUNTER — Observation Stay (HOSPITAL_COMMUNITY): Payer: Medicare Other

## 2022-04-08 ENCOUNTER — Encounter (HOSPITAL_COMMUNITY): Payer: Self-pay | Admitting: Orthopaedic Surgery

## 2022-04-08 DIAGNOSIS — Z96642 Presence of left artificial hip joint: Secondary | ICD-10-CM | POA: Diagnosis not present

## 2022-04-08 DIAGNOSIS — Z471 Aftercare following joint replacement surgery: Secondary | ICD-10-CM | POA: Diagnosis not present

## 2022-04-08 DIAGNOSIS — Z79899 Other long term (current) drug therapy: Secondary | ICD-10-CM | POA: Insufficient documentation

## 2022-04-08 DIAGNOSIS — I10 Essential (primary) hypertension: Secondary | ICD-10-CM

## 2022-04-08 DIAGNOSIS — Z96651 Presence of right artificial knee joint: Secondary | ICD-10-CM | POA: Insufficient documentation

## 2022-04-08 DIAGNOSIS — Z96641 Presence of right artificial hip joint: Secondary | ICD-10-CM

## 2022-04-08 DIAGNOSIS — M1611 Unilateral primary osteoarthritis, right hip: Principal | ICD-10-CM | POA: Insufficient documentation

## 2022-04-08 HISTORY — PX: TOTAL HIP ARTHROPLASTY: SHX124

## 2022-04-08 SURGERY — ARTHROPLASTY, HIP, TOTAL, ANTERIOR APPROACH
Anesthesia: Spinal | Site: Hip | Laterality: Right

## 2022-04-08 MED ORDER — ONDANSETRON HCL 4 MG PO TABS: 4.0000 mg | ORAL_TABLET | Freq: Four times a day (QID) | ORAL | Status: DC | PRN

## 2022-04-08 MED ORDER — CHLORHEXIDINE GLUCONATE 0.12 % MT SOLN
OROMUCOSAL | Status: AC
  Administered 2022-04-08: 15 mL via OROMUCOSAL
  Filled 2022-04-08: qty 15

## 2022-04-08 MED ORDER — OXYCODONE HCL 5 MG PO TABS
5.0000 mg | ORAL_TABLET | ORAL | Status: DC | PRN
  Administered 2022-04-09: 10 mg via ORAL
  Filled 2022-04-08: qty 2

## 2022-04-08 MED ORDER — PHENYLEPHRINE HCL-NACL 20-0.9 MG/250ML-% IV SOLN
INTRAVENOUS | Status: DC | PRN
  Administered 2022-04-08: 25 ug/min via INTRAVENOUS

## 2022-04-08 MED ORDER — FENTANYL CITRATE (PF) 100 MCG/2ML IJ SOLN: 25.0000 ug | INTRAMUSCULAR | Status: DC | PRN

## 2022-04-08 MED ORDER — 0.9 % SODIUM CHLORIDE (POUR BTL) OPTIME
TOPICAL | Status: DC | PRN
  Administered 2022-04-08: 1000 mL

## 2022-04-08 MED ORDER — CEFAZOLIN SODIUM-DEXTROSE 1-4 GM/50ML-% IV SOLN
1.0000 g | Freq: Four times a day (QID) | INTRAVENOUS | Status: AC
  Administered 2022-04-08 (×2): 1 g via INTRAVENOUS
  Filled 2022-04-08 (×2): qty 50

## 2022-04-08 MED ORDER — LISINOPRIL 10 MG PO TABS
10.0000 mg | ORAL_TABLET | Freq: Every day | ORAL | Status: DC
  Administered 2022-04-09: 10 mg via ORAL
  Filled 2022-04-08: qty 1

## 2022-04-08 MED ORDER — PROPOFOL 10 MG/ML IV BOLUS
INTRAVENOUS | Status: AC
  Filled 2022-04-08: qty 20

## 2022-04-08 MED ORDER — POVIDONE-IODINE 10 % EX SWAB
2.0000 | Freq: Once | CUTANEOUS | Status: AC
  Administered 2022-04-08: 2 via TOPICAL

## 2022-04-08 MED ORDER — METOCLOPRAMIDE HCL 5 MG/ML IJ SOLN: 5.0000 mg | Freq: Three times a day (TID) | INTRAMUSCULAR | Status: DC | PRN

## 2022-04-08 MED ORDER — ACETAMINOPHEN 325 MG PO TABS: 325.0000 mg | ORAL_TABLET | Freq: Four times a day (QID) | ORAL | Status: DC | PRN

## 2022-04-08 MED ORDER — PROPOFOL 500 MG/50ML IV EMUL
INTRAVENOUS | Status: DC | PRN
  Administered 2022-04-08: 75 ug/kg/min via INTRAVENOUS

## 2022-04-08 MED ORDER — PROPOFOL 10 MG/ML IV BOLUS
INTRAVENOUS | Status: DC | PRN
  Administered 2022-04-08: 20 mg via INTRAVENOUS
  Administered 2022-04-08: 25 mg via INTRAVENOUS

## 2022-04-08 MED ORDER — CEFAZOLIN SODIUM-DEXTROSE 2-4 GM/100ML-% IV SOLN
INTRAVENOUS | Status: AC
  Filled 2022-04-08: qty 100

## 2022-04-08 MED ORDER — FENTANYL CITRATE (PF) 250 MCG/5ML IJ SOLN
INTRAMUSCULAR | Status: AC
  Filled 2022-04-08: qty 5

## 2022-04-08 MED ORDER — POTASSIUM 99 MG PO TABS: 99.0000 mg | ORAL_TABLET | Freq: Every day | ORAL | Status: DC

## 2022-04-08 MED ORDER — HYDROMORPHONE HCL 1 MG/ML IJ SOLN
0.5000 mg | INTRAMUSCULAR | Status: DC | PRN
  Administered 2022-04-08: 1 mg via INTRAVENOUS
  Filled 2022-04-08: qty 1

## 2022-04-08 MED ORDER — OXYCODONE HCL 5 MG PO TABS
10.0000 mg | ORAL_TABLET | ORAL | Status: DC | PRN
  Administered 2022-04-08 (×2): 10 mg via ORAL
  Administered 2022-04-09: 15 mg via ORAL
  Filled 2022-04-08: qty 3
  Filled 2022-04-08 (×2): qty 2

## 2022-04-08 MED ORDER — FENTANYL CITRATE (PF) 250 MCG/5ML IJ SOLN
INTRAMUSCULAR | Status: DC | PRN
  Administered 2022-04-08 (×2): 50 ug via INTRAVENOUS

## 2022-04-08 MED ORDER — FERROUS GLUCONATE 324 (38 FE) MG PO TABS
324.0000 mg | ORAL_TABLET | Freq: Every day | ORAL | Status: DC
  Administered 2022-04-08 – 2022-04-09 (×2): 324 mg via ORAL
  Filled 2022-04-08 (×3): qty 1

## 2022-04-08 MED ORDER — DOCUSATE SODIUM 100 MG PO CAPS
100.0000 mg | ORAL_CAPSULE | Freq: Two times a day (BID) | ORAL | Status: DC
  Administered 2022-04-08 – 2022-04-09 (×3): 100 mg via ORAL
  Filled 2022-04-08 (×3): qty 1

## 2022-04-08 MED ORDER — PHENYLEPHRINE 80 MCG/ML (10ML) SYRINGE FOR IV PUSH (FOR BLOOD PRESSURE SUPPORT)
PREFILLED_SYRINGE | INTRAVENOUS | Status: AC
  Filled 2022-04-08: qty 10

## 2022-04-08 MED ORDER — PANTOPRAZOLE SODIUM 40 MG PO TBEC
40.0000 mg | DELAYED_RELEASE_TABLET | Freq: Every day | ORAL | Status: DC
  Administered 2022-04-08 – 2022-04-09 (×2): 40 mg via ORAL
  Filled 2022-04-08 (×2): qty 1

## 2022-04-08 MED ORDER — ORAL CARE MOUTH RINSE: 15.0000 mL | Freq: Once | OROMUCOSAL | Status: AC

## 2022-04-08 MED ORDER — ALUM & MAG HYDROXIDE-SIMETH 200-200-20 MG/5ML PO SUSP: 30.0000 mL | ORAL | Status: DC | PRN

## 2022-04-08 MED ORDER — CHLORHEXIDINE GLUCONATE 0.12 % MT SOLN: 15.0000 mL | Freq: Once | OROMUCOSAL | Status: AC

## 2022-04-08 MED ORDER — ONDANSETRON HCL 4 MG/2ML IJ SOLN
4.0000 mg | Freq: Four times a day (QID) | INTRAMUSCULAR | Status: DC | PRN
  Filled 2022-04-08: qty 2

## 2022-04-08 MED ORDER — ONDANSETRON HCL 4 MG/2ML IJ SOLN
INTRAMUSCULAR | Status: DC | PRN
  Administered 2022-04-08: 4 mg via INTRAVENOUS

## 2022-04-08 MED ORDER — SODIUM CHLORIDE 0.9 % IV SOLN: INTRAVENOUS | Status: DC

## 2022-04-08 MED ORDER — LIDOCAINE 2% (20 MG/ML) 5 ML SYRINGE
INTRAMUSCULAR | Status: DC | PRN
  Administered 2022-04-08: 60 mg via INTRAVENOUS

## 2022-04-08 MED ORDER — ASPIRIN 81 MG PO CHEW
81.0000 mg | CHEWABLE_TABLET | Freq: Two times a day (BID) | ORAL | Status: DC
  Administered 2022-04-08 – 2022-04-09 (×2): 81 mg via ORAL
  Filled 2022-04-08 (×2): qty 1

## 2022-04-08 MED ORDER — TRANEXAMIC ACID-NACL 1000-0.7 MG/100ML-% IV SOLN
1000.0000 mg | INTRAVENOUS | Status: AC
  Administered 2022-04-08: 1000 mg via INTRAVENOUS

## 2022-04-08 MED ORDER — METHOCARBAMOL 500 MG PO TABS
500.0000 mg | ORAL_TABLET | Freq: Four times a day (QID) | ORAL | Status: DC | PRN
  Administered 2022-04-08 – 2022-04-09 (×3): 500 mg via ORAL
  Filled 2022-04-08 (×3): qty 1

## 2022-04-08 MED ORDER — OXYCODONE HCL 5 MG/5ML PO SOLN: 5.0000 mg | Freq: Once | ORAL | Status: DC | PRN

## 2022-04-08 MED ORDER — METOCLOPRAMIDE HCL 5 MG PO TABS: 5.0000 mg | ORAL_TABLET | Freq: Three times a day (TID) | ORAL | Status: DC | PRN

## 2022-04-08 MED ORDER — POLYETHYLENE GLYCOL 3350 17 G PO PACK: 17.0000 g | PACK | Freq: Every day | ORAL | Status: DC | PRN

## 2022-04-08 MED ORDER — CEFAZOLIN SODIUM-DEXTROSE 2-4 GM/100ML-% IV SOLN
2.0000 g | INTRAVENOUS | Status: AC
  Administered 2022-04-08: 2 g via INTRAVENOUS

## 2022-04-08 MED ORDER — DIPHENHYDRAMINE HCL 12.5 MG/5ML PO ELIX: 12.5000 mg | ORAL_SOLUTION | ORAL | Status: DC | PRN

## 2022-04-08 MED ORDER — VITAMIN D 25 MCG (1000 UNIT) PO TABS
1000.0000 [IU] | ORAL_TABLET | Freq: Every day | ORAL | Status: DC
  Administered 2022-04-08 – 2022-04-09 (×2): 1000 [IU] via ORAL
  Filled 2022-04-08 (×2): qty 1

## 2022-04-08 MED ORDER — LIDOCAINE 2% (20 MG/ML) 5 ML SYRINGE
INTRAMUSCULAR | Status: AC
  Filled 2022-04-08: qty 5

## 2022-04-08 MED ORDER — ACETAMINOPHEN 500 MG PO TABS
ORAL_TABLET | ORAL | Status: AC
  Administered 2022-04-08: 1000 mg via ORAL
  Filled 2022-04-08: qty 2

## 2022-04-08 MED ORDER — OXYCODONE HCL 5 MG PO TABS: 5.0000 mg | ORAL_TABLET | Freq: Once | ORAL | Status: DC | PRN

## 2022-04-08 MED ORDER — PHENOL 1.4 % MT LIQD: 1.0000 | OROMUCOSAL | Status: DC | PRN

## 2022-04-08 MED ORDER — METHOCARBAMOL 1000 MG/10ML IJ SOLN: 500.0000 mg | Freq: Four times a day (QID) | INTRAVENOUS | Status: DC | PRN

## 2022-04-08 MED ORDER — MENTHOL 3 MG MT LOZG: 1.0000 | LOZENGE | OROMUCOSAL | Status: DC | PRN

## 2022-04-08 MED ORDER — ONDANSETRON HCL 4 MG/2ML IJ SOLN: 4.0000 mg | Freq: Once | INTRAMUSCULAR | Status: DC | PRN

## 2022-04-08 MED ORDER — ACETAMINOPHEN 500 MG PO TABS: 1000.0000 mg | ORAL_TABLET | Freq: Once | ORAL | Status: AC

## 2022-04-08 MED ORDER — BUPIVACAINE IN DEXTROSE 0.75-8.25 % IT SOLN
INTRATHECAL | Status: DC | PRN
  Administered 2022-04-08: 1.6 mL via INTRATHECAL

## 2022-04-08 MED ORDER — PHENYLEPHRINE 80 MCG/ML (10ML) SYRINGE FOR IV PUSH (FOR BLOOD PRESSURE SUPPORT)
PREFILLED_SYRINGE | INTRAVENOUS | Status: DC | PRN
  Administered 2022-04-08: 160 ug via INTRAVENOUS

## 2022-04-08 MED ORDER — LACTATED RINGERS IV SOLN: INTRAVENOUS | Status: DC

## 2022-04-08 MED ORDER — TRANEXAMIC ACID-NACL 1000-0.7 MG/100ML-% IV SOLN
INTRAVENOUS | Status: AC
  Filled 2022-04-08: qty 100

## 2022-04-08 SURGICAL SUPPLY — 54 items
APL SKNCLS STERI-STRIP NONHPOA (GAUZE/BANDAGES/DRESSINGS)
BAG COUNTER SPONGE SURGICOUNT (BAG) ×2 IMPLANT
BAG SPNG CNTER NS LX DISP (BAG) ×1
BENZOIN TINCTURE PRP APPL 2/3 (GAUZE/BANDAGES/DRESSINGS) ×2 IMPLANT
BLADE CLIPPER SURG (BLADE) IMPLANT
BLADE SAW SGTL 18X1.27X75 (BLADE) ×2 IMPLANT
COVER SURGICAL LIGHT HANDLE (MISCELLANEOUS) ×2 IMPLANT
CUP SECTOR GRIPTON 50MM (Cup) IMPLANT
DRAPE C-ARM 42X72 X-RAY (DRAPES) ×2 IMPLANT
DRAPE STERI IOBAN 125X83 (DRAPES) ×2 IMPLANT
DRAPE U-SHAPE 47X51 STRL (DRAPES) ×6 IMPLANT
DRSG AQUACEL AG ADV 3.5X10 (GAUZE/BANDAGES/DRESSINGS) ×2 IMPLANT
DURAPREP 26ML APPLICATOR (WOUND CARE) ×2 IMPLANT
ELECT BLADE 4.0 EZ CLEAN MEGAD (MISCELLANEOUS) ×1
ELECT BLADE 6.5 EXT (BLADE) IMPLANT
ELECT REM PT RETURN 9FT ADLT (ELECTROSURGICAL) ×1
ELECTRODE BLDE 4.0 EZ CLN MEGD (MISCELLANEOUS) ×2 IMPLANT
ELECTRODE REM PT RTRN 9FT ADLT (ELECTROSURGICAL) ×2 IMPLANT
FACESHIELD WRAPAROUND (MASK) ×2 IMPLANT
FACESHIELD WRAPAROUND OR TEAM (MASK) ×4 IMPLANT
GLOVE BIOGEL PI IND STRL 8 (GLOVE) ×4 IMPLANT
GLOVE ECLIPSE 8.0 STRL XLNG CF (GLOVE) ×2 IMPLANT
GLOVE ORTHO TXT STRL SZ7.5 (GLOVE) ×4 IMPLANT
GOWN STRL REUS W/ TWL LRG LVL3 (GOWN DISPOSABLE) ×4 IMPLANT
GOWN STRL REUS W/ TWL XL LVL3 (GOWN DISPOSABLE) ×4 IMPLANT
GOWN STRL REUS W/TWL LRG LVL3 (GOWN DISPOSABLE) ×1
GOWN STRL REUS W/TWL XL LVL3 (GOWN DISPOSABLE) ×2
GOWN STRL SURGICAL XL XLNG (GOWN DISPOSABLE) IMPLANT
HANDPIECE INTERPULSE COAX TIP (DISPOSABLE)
HEAD FEM STD 32X+1 STRL (Hips) IMPLANT
KIT BASIN OR (CUSTOM PROCEDURE TRAY) ×2 IMPLANT
KIT TURNOVER KIT B (KITS) ×2 IMPLANT
LINER ACET PNNCL PLUS4 NEUTRAL (Hips) IMPLANT
NS IRRIG 1000ML POUR BTL (IV SOLUTION) ×2 IMPLANT
PACK TOTAL JOINT (CUSTOM PROCEDURE TRAY) ×2 IMPLANT
PAD ARMBOARD 7.5X6 YLW CONV (MISCELLANEOUS) ×2 IMPLANT
PINNACLE PLUS 4 NEUTRAL (Hips) ×1 IMPLANT
SET HNDPC FAN SPRY TIP SCT (DISPOSABLE) ×2 IMPLANT
STAPLER VISISTAT 35W (STAPLE) IMPLANT
STEM CORAIL KA10 (Stem) IMPLANT
STRIP CLOSURE SKIN 1/2X4 (GAUZE/BANDAGES/DRESSINGS) ×4 IMPLANT
SUT ETHIBOND NAB CT1 #1 30IN (SUTURE) ×2 IMPLANT
SUT MNCRL AB 4-0 PS2 18 (SUTURE) IMPLANT
SUT VIC AB 0 CT1 27 (SUTURE) ×1
SUT VIC AB 0 CT1 27XBRD ANBCTR (SUTURE) ×2 IMPLANT
SUT VIC AB 1 CT1 27 (SUTURE) ×1
SUT VIC AB 1 CT1 27XBRD ANBCTR (SUTURE) ×2 IMPLANT
SUT VIC AB 2-0 CT1 27 (SUTURE) ×2
SUT VIC AB 2-0 CT1 TAPERPNT 27 (SUTURE) ×2 IMPLANT
TOWEL GREEN STERILE (TOWEL DISPOSABLE) ×2 IMPLANT
TOWEL GREEN STERILE FF (TOWEL DISPOSABLE) ×2 IMPLANT
TRAY FOLEY W/BAG SLVR 16FR (SET/KITS/TRAYS/PACK) ×1
TRAY FOLEY W/BAG SLVR 16FR ST (SET/KITS/TRAYS/PACK) IMPLANT
WATER STERILE IRR 1000ML POUR (IV SOLUTION) ×4 IMPLANT

## 2022-04-08 NOTE — Op Note (Signed)
Operative Note  Date of operation: 04/08/2022 Preoperative diagnosis: Right hip primary osteoarthritis Postoperative diagnosis: Same  Procedure: Right direct anterior total hip arthroplasty  Implants: DePuy sector GRIPTION SR component size 50, 32+4 polythene liner, size 10 Corail femoral component with standard offset, 32+1 metal head ball  Surgeon: Lind Guest. Ninfa Linden, MD Assistant: Wandra Arthurs, RNFA  Anesthesia: Spinal Antibiotics: 2 g IV Ancef EBL: 250 cc Locations: None  Indications: The patient is a 76 year old female with debilitating arthritis of her right hip that is been well-documented with plain films and especially MRI.  The MRI shows a subchondral fracture.  We replaced her left hip successfully in 2022.  At this point her right hip pain is daily and it is severe.  She has tried and failed all forms conservative treatment.  At this point her right hip is detrimentally affecting her mobility, her quality of life and her actives daily living.  She wishes to proceed with a total hip arthroplasty and we agree with this based on her clinical exam findings and x-ray findings.  Having had this done before she is fully aware of the risks of acute blood loss anemia, nerve or vessel injury, fracture, infection, DVT, implant failure, dislocation, leg length differences and wound healing issues.  Procedure description: After informed consent was obtained and the appropriate right hip was marked, the patient was brought to the operating room and set up on the stretcher where spinal anesthesia was obtained.  She was then laid in supine position on the stretcher and a Foley catheter was placed.  Traction boots were then placed on both of her feet and she was then placed supine on the Hana fracture table with a perineal post in place in both legs and inline skeletal traction devices but no traction applied.  Her right operative hip was then prepped and draped with DuraPrep and sterile  drapes.  A timeout was called and she was then applied as the correct patient and the correct right hip.  An incision was then made inferior and posterior to the ASIS and carried slightly obliquely down the leg.  Dissection was carried down to the tensor fascia lata muscle and the tensor fascia was then divided longitudinally to proceed with a direct and approach the hip.  Circumflex vessels were identified and cauterized.  The hip capsule was opened up in L-type format finding a moderate joint effusion.  Cobra retractors were placed around the medial and lateral femoral neck and a femoral neck cut was made with an oscillating saw just proximal to the lesser trochanter.  This was completed with an osteotome.  The corkscrew guide was placed in the femoral head and the femoral head was removed in its entirety and found a wide area devoid of cartilage.  A bent Hohmann was then placed over the medial acetabular rim and remnants of the acetabular labrum and other debris were removed.  We then began reaming under direct visitation from a size 43 reamer and stepwise increments going up to a size 49 reamer with all reamers placed under regularization and the last reamer was placed on direct fluoroscopy so we could obtain our depth of reaming, the inclination and the anteversion.  The real DePuy sector GRIPTION acetabular component size 50 was then placed without difficulty and a 32+4 polythene liner was placed within that size 50 acetabular component. Attention was then turned to the femur.  With the right leg externally rotated to 120 degrees, extended and adducted, a Mueller retractor was  placed next to the medial calcar and a Hohmann retractor was placed behind the greater trochanter.  A box cutting osteotome was used to enter the femoral canal and then broaching was initiated from a size 8 broach going to a size 10 using the Corail system.  With a size 10 broach in place we trialed a standard offset femoral neck and a  32+1 trial hip ball.  The right operative leg was brought over and up and with traction and internal rotation reduced in the pelvis.  We are pleased with the offset, leg length and stability assessed mechanically and radiographically.  The hip was then dislocated and the trial components were removed.  The real size 10 standard offset femoral component was then placed without difficulty followed by the real 32+1 metal head ball.  We again assessed it radiographically and mechanically and replaced.  Irrigation of the soft tissue was then performed with normal saline solution.  The joint capsule was closed with interrupted #1 Vicryl suture followed by 0 Vicryl close the deep tissue and 2-0 Vicryl to close the subcutaneous tissue.  The skin was closed with staples.  An Aquacel dressing was applied.  She was taken off the Hana table and taken recovery room in stable addition with all final counts being correct and no complications noted.  Wandra Arthurs, RNFA was integral to completing this case from beginning to end and his assistance was medically necessary.

## 2022-04-08 NOTE — Anesthesia Procedure Notes (Signed)
Procedure Name: MAC Date/Time: 04/08/2022 7:39 AM  Performed by: Colin Benton, CRNAPre-anesthesia Checklist: Patient identified, Emergency Drugs available, Suction available and Patient being monitored Patient Re-evaluated:Patient Re-evaluated prior to induction Oxygen Delivery Method: Simple face mask Induction Type: IV induction Placement Confirmation: positive ETCO2 Dental Injury: Teeth and Oropharynx as per pre-operative assessment

## 2022-04-08 NOTE — Transfer of Care (Signed)
Immediate Anesthesia Transfer of Care Note  Patient: LAVIDA PATCH  Procedure(s) Performed: RIGHT TOTAL HIP ARTHROPLASTY (Right: Hip)  Patient Location: PACU  Anesthesia Type:Spinal  Level of Consciousness: awake  Airway & Oxygen Therapy: Patient Spontanous Breathing and Patient connected to face mask oxygen  Post-op Assessment: Report given to RN and Post -op Vital signs reviewed and stable  Post vital signs: Reviewed and stable  Last Vitals:  Vitals Value Taken Time  BP 120/70 04/08/22 0915  Temp    Pulse 95 04/08/22 0917  Resp 19 04/08/22 0917  SpO2 99 % 04/08/22 0917  Vitals shown include unvalidated device data.  Last Pain:  Vitals:   04/08/22 0625  PainSc: 7       Patients Stated Pain Goal: 3 (01/48/40 3979)  Complications: No notable events documented.

## 2022-04-08 NOTE — Evaluation (Signed)
Physical Therapy Evaluation Patient Details Name: Katherine Stanley MRN: [id number] DOB: March 29, 1946 Today's Date: 04/08/2022  History of Present Illness  Patient is a 76 y/o female who presents on 12/26 for Rt THA, direct anterior approach. PMH includes OA, prediabetes, Rt TKA 2017, HTN, osteopenia.  Clinical Impression  Patient presents with pain, dizziness and post surgical deficits s/p above surgery. Pt lives at home with her spouse and has been using a quad cane for ambulation PTA. Pt loves to play golf but has been unable too due to pain since Sept 2023. Reports fall on golf course around this time. Today, mobility limited due to dizziness, hot flashes and nausea with standing. Requires Min A to manage RLE and for standing; able to take a few steps along side bed with support but needed to return to supine due to above. Likely will progress well when symptoms resolve. Pt had been given dilaudid prior to arrival. nstructed pt in there ex to perform. Will follow acutely to maximize independence and mobility prior to return home.       Recommendations for follow up therapy are one component of a multi-disciplinary discharge planning process, led by the attending physician.  Recommendations may be updated based on patient status, additional functional criteria and insurance authorization.  Follow Up Recommendations Follow physician's recommendations for discharge plan and follow up therapies      Assistance Recommended at Discharge Intermittent Supervision/Assistance  Patient can return home with the following  A little help with walking and/or transfers;A little help with bathing/dressing/bathroom;Assistance with cooking/housework;Help with stairs or ramp for entrance;Assist for transportation    Equipment Recommendations None recommended by PT  Recommendations for Other Services       Functional Status Assessment Patient has had a recent decline in their functional status and  demonstrates the ability to make significant improvements in function in a reasonable and predictable amount of time.     Precautions / Restrictions Precautions Precautions: Fall Restrictions Weight Bearing Restrictions: Yes RLE Weight Bearing: Weight bearing as tolerated      Mobility  Bed Mobility Overal bed mobility: Needs Assistance Bed Mobility: Supine to Sit     Supine to sit: Min assist, HOB elevated     General bed mobility comments: Assist to bring RLE to EOB, increased time. Dizziness once upright, given dilaudid prior to arrival.    Transfers Overall transfer level: Needs assistance Equipment used: 1 person hand held assist Transfers: Sit to/from Stand Sit to Stand: Min assist           General transfer comment: Min A to power to standing from EOB x1.    Ambulation/Gait Ambulation/Gait assistance: Min assist Gait Distance (Feet): 3 Feet Assistive device: 1 person hand held assist   Gait velocity: decreased     General Gait Details: Able to side step along side the bed with Min A for support. Returned to supine due to feeling hot, nauseous and "drunk"  Financial trader [employer] (Stroke Patients Only)       Balance Overall balance assessment: Needs assistance Sitting-balance support: Feet supported, No upper extremity supported Sitting balance-Leahy Scale: Good Sitting balance - Comments: Mild sway in trunk sitting EOB- likely effects from dilaudid.   Standing balance support: During functional activity Standing balance-Leahy Scale: Poor Standing balance comment: Requires external support.  Pertinent Vitals/Pain Pain Assessment Pain Assessment: Faces Faces Pain Scale: Hurts little more Pain Location: right hip Pain Descriptors / Indicators: Sore, Operative site guarding Pain Intervention(s): Monitored during session, Premedicated before session, Repositioned,  Limited activity within patient's tolerance, Ice applied    Home Living Family/patient expects to be discharged to:: Private residence Living Arrangements: Spouse/significant other Available Help at Discharge: Family;Available 24 hours/day Type of Home: House Home Access: Level entry       Home Layout: Multi-level;Able to live on main level with bedroom/bathroom Home Equipment: Rolling Walker (2 wheels);BSC/3in1;Cane - quad;Grab bars - tub/shower;Shower seat      Prior Function Prior Level of Function : Independent/Modified Independent             Mobility Comments: Using quad cane as needed. Loves to golf. 1 fall on golf course in September 2023. ADLs Comments: independent     Hand Dominance   Dominant Hand: Right    Extremity/Trunk Assessment   Upper Extremity Assessment Upper Extremity Assessment: Defer to OT evaluation    Lower Extremity Assessment Lower Extremity Assessment: RLE deficits/detail RLE Deficits / Details: ABle to perform AP, QS and LAQ without difficulty. Post op deficits present. RLE Sensation: WNL    Cervical / Trunk Assessment Cervical / Trunk Assessment: Normal  Communication   Communication: No difficulties  Cognition Arousal/Alertness: Awake/alert Behavior During Therapy: WFL for tasks assessed/performed Overall Cognitive Status: Within Functional Limits for tasks assessed                                          General Comments General comments (skin integrity, edema, etc.): Spouse present but stepped out of room for session.    Exercises Total Joint Exercises Ankle Circles/Pumps: AROM, Both, 10 reps, Supine Quad Sets: AROM, Both, 10 reps, Supine Long Arc Quad: AROM, Right, 5 reps, Seated   Assessment/Plan    PT Assessment Patient needs continued PT services  PT Problem List Decreased strength;Decreased mobility;Decreased skin integrity;Pain;Decreased balance       PT Treatment Interventions Therapeutic  exercise;Gait training;Patient/family education;Therapeutic activities;Balance training;Stair training    PT Goals (Current goals can be found in the Care Plan section)  Acute Rehab PT Goals Patient Stated Goal: to get back to playing golf PT Goal Formulation: With patient Time For Goal Achievement: 04/22/22 Potential to Achieve Goals: Good    Frequency 7X/week     Co-evaluation               AM-PAC PT "6 Clicks" Mobility  Outcome Measure Help needed turning from your back to your side while in a flat bed without using bedrails?: A Little Help needed moving from lying on your back to sitting on the side of a flat bed without using bedrails?: A Little Help needed moving to and from a bed to a chair (including a wheelchair)?: A Little Help needed standing up from a chair using your arms (e.g., wheelchair or bedside chair)?: A Little Help needed to walk in hospital room?: A Little Help needed climbing 3-5 steps with a railing? : Total 6 Click Score: 16    End of Session Equipment Utilized During Treatment: Gait belt Activity Tolerance: Treatment limited secondary to medical complications (Comment) (dizziness, hot flashes, nausea) Patient left: in bed;with call bell/phone within reach;with family/visitor present Nurse Communication: Mobility status PT Visit Diagnosis: Pain;Difficulty in walking, not elsewhere classified (R26.2);Dizziness and giddiness (R42) Pain -  Right/Left: Right Pain - part of body: Hip    Time: 4707-6151 PT Time Calculation (min) (ACUTE ONLY): 20 min   Charges:   PT Evaluation $PT Eval Moderate Complexity: 1 Mod          Marisa Severin, PT, DPT Acute Rehabilitation Services Secure chat preferred Office [phone]     Marguarite Arbour A [place] 04/08/2022, 11:40 AM

## 2022-04-08 NOTE — Anesthesia Postprocedure Evaluation (Signed)
Anesthesia Post Note  Patient: [name]  Procedure(s) Performed: RIGHT TOTAL HIP ARTHROPLASTY (Right: Hip)     Patient location during evaluation: PACU Anesthesia Type: Spinal Level of consciousness: awake and alert Pain management: pain level controlled Vital Signs Assessment: post-procedure vital signs reviewed and stable Respiratory status: spontaneous breathing and respiratory function stable Cardiovascular status: blood pressure returned to baseline and stable Postop Assessment: spinal receding and no apparent nausea or vomiting Anesthetic complications: no   No notable events documented.  Last Vitals:  Vitals:   04/08/22 0945 04/08/22 1012  BP: (!) 125/95 106/88  Pulse: 97 89  Resp: 20 18  Temp: 36.7 C 36.7 C  SpO2: 97% 98%    Last Pain:  Vitals:   04/08/22 1012  TempSrc: Oral  PainSc: 0-No pain    LLE Motor Response: Purposeful movement (04/08/22 1024) LLE Sensation: Full sensation (04/08/22 1024) RLE Motor Response: Purposeful movement (04/08/22 1024) RLE Sensation: Full sensation (04/08/22 1024)      Katherine Stanley

## 2022-04-08 NOTE — Anesthesia Procedure Notes (Signed)
Spinal  Patient location during procedure: OR Start time: 04/08/2022 7:33 AM End time: 04/08/2022 7:37 AM Reason for block: surgical anesthesia Staffing Performed: anesthesiologist  Anesthesiologist: Audry Pili, MD Performed by: Audry Pili, MD Authorized by: Audry Pili, MD   Preanesthetic Checklist Completed: patient identified, IV checked, risks and benefits discussed, surgical consent, monitors and equipment checked, pre-op evaluation and timeout performed Spinal Block Patient position: sitting Prep: DuraPrep Patient monitoring: heart rate, cardiac monitor, continuous pulse ox and blood pressure Approach: midline Location: L3-4 Injection technique: single-shot Needle Needle type: Pencan  Needle gauge: 24 G Additional Notes Consent was obtained prior to the procedure with all questions answered and concerns addressed. Risks including, but not limited to, bleeding, infection, nerve damage, paralysis, failed block, inadequate analgesia, allergic reaction, high spinal, itching, and headache were discussed and the patient wished to proceed. Functioning IV was confirmed and monitors were applied. Sterile prep and drape, including hand hygiene, mask, and sterile gloves were used. The patient was positioned and the spine was prepped. The skin was anesthetized with lidocaine. Free flow of clear CSF was obtained prior to injecting local anesthetic into the CSF. The spinal needle aspirated freely following injection. The needle was carefully withdrawn. The patient tolerated the procedure well.   Renold Don, MD

## 2022-04-08 NOTE — Interval H&P Note (Signed)
History and Physical Interval Note: The patient understands that she is here today for a right hip replacement to treat her severe right hip osteoarthritis.  There has been no acute or interval change in her medical status.  See H&P.  The risks and benefits of surgery have been explained in detail and informed consent is obtained.  The right operative hip has been marked.  04/08/2022 7:16 AM  Katherine Stanley  has presented today for surgery, with the diagnosis of Osteoarthritis Right Hip.  The various methods of treatment have been discussed with the patient and family. After consideration of risks, benefits and other options for treatment, the patient has consented to  Procedure(s) with comments: RIGHT TOTAL HIP ARTHROPLASTY ANTERIOR APPROACH (Right) - Needs RNFA as a surgical intervention.  The patient's history has been reviewed, patient examined, no change in status, stable for surgery.  I have reviewed the patient's chart and labs.  Questions were answered to the patient's satisfaction.     Katherine Stanley

## 2022-04-09 ENCOUNTER — Encounter (HOSPITAL_COMMUNITY): Payer: Self-pay | Admitting: Orthopaedic Surgery

## 2022-04-09 DIAGNOSIS — I10 Essential (primary) hypertension: Secondary | ICD-10-CM | POA: Diagnosis not present

## 2022-04-09 DIAGNOSIS — M1611 Unilateral primary osteoarthritis, right hip: Secondary | ICD-10-CM | POA: Diagnosis not present

## 2022-04-09 DIAGNOSIS — Z79899 Other long term (current) drug therapy: Secondary | ICD-10-CM | POA: Diagnosis not present

## 2022-04-09 DIAGNOSIS — Z96651 Presence of right artificial knee joint: Secondary | ICD-10-CM | POA: Diagnosis not present

## 2022-04-09 DIAGNOSIS — Z96642 Presence of left artificial hip joint: Secondary | ICD-10-CM | POA: Diagnosis not present

## 2022-04-09 LAB — BASIC METABOLIC PANEL
Anion gap: 10 (ref 5–15)
BUN: 11 mg/dL (ref 8–23)
CO2: 22 mmol/L (ref 22–32)
Calcium: 9.1 mg/dL (ref 8.9–10.3)
Chloride: 104 mmol/L (ref 98–111)
Creatinine, Ser: 0.61 mg/dL (ref 0.44–1.00)
GFR, Estimated: >60 mL/min (ref >60)
Glucose, Bld: 145 mg/dL — ABNORMAL HIGH (ref 70–99)
Potassium: 3.5 mmol/L (ref 3.5–5.1)
Sodium: 136 mmol/L (ref 135–145)

## 2022-04-09 LAB — CBC
HCT: 33.4 % — ABNORMAL LOW (ref 36.0–46.0)
Hemoglobin: 11.6 g/dL — ABNORMAL LOW (ref 12.0–15.0)
MCH: 31.6 pg (ref 26.0–34.0)
MCHC: 34.7 g/dL (ref 30.0–36.0)
MCV: 91 fL (ref 80.0–100.0)
Platelets: 239 10*3/uL (ref 150–400)
RBC: 3.67 MIL/uL — ABNORMAL LOW (ref 3.87–5.11)
RDW: 12.6 % (ref 11.5–15.5)
WBC: 10.8 10*3/uL — ABNORMAL HIGH (ref 4.0–10.5)
nRBC: 0 % (ref 0.0–0.2)

## 2022-04-09 MED ORDER — METHOCARBAMOL 500 MG PO TABS: 500.0000 mg | ORAL_TABLET | Freq: Four times a day (QID) | ORAL | 1 refills | Status: DC | PRN

## 2022-04-09 MED ORDER — OXYCODONE HCL 5 MG PO TABS: 5.0000 mg | ORAL_TABLET | Freq: Four times a day (QID) | ORAL | 0 refills | Status: DC | PRN

## 2022-04-09 MED ORDER — ASPIRIN 81 MG PO CHEW: 81.0000 mg | CHEWABLE_TABLET | Freq: Two times a day (BID) | ORAL | 0 refills | Status: DC

## 2022-04-09 NOTE — Progress Notes (Signed)
Physical Therapy Treatment Patient Details Name: Katherine Stanley MRN: [id number] DOB: 1945-10-12 Today's Date: 04/09/2022   History of Present Illness Patient is a 76 y/o female who presents on 12/26 for Rt THA, direct anterior approach. PMH includes OA, prediabetes, Rt TKA 2017, HTN, osteopenia.    PT Comments    Safely ambulating with use of her RW from home, up to 200 feet at supervision level. No evidence of buckling or overt LOB with light use of this AD. Reviewed gait symmetry and progression. No stairs at home. Supervision for transfer from recliner. Husband present and supportive. Reviewed HEP handout. No further questions from patient. Adequate for d/c from PT standpoint when medically ready. Will continue to follow and work with pt until d/c.   Recommendations for follow up therapy are one component of a multi-disciplinary discharge planning process, led by the attending physician.  Recommendations may be updated based on patient status, additional functional criteria and insurance authorization.  Follow Up Recommendations  Follow physician's recommendations for discharge plan and follow up therapies     Assistance Recommended at Discharge Intermittent Supervision/Assistance  Patient can return home with the following A little help with walking and/or transfers;A little help with bathing/dressing/bathroom;Assistance with cooking/housework;Help with stairs or ramp for entrance;Assist for transportation   Equipment Recommendations  None recommended by PT    Recommendations for Other Services       Precautions / Restrictions Precautions Precautions: Fall Restrictions Weight Bearing Restrictions: Yes RLE Weight Bearing: Weight bearing as tolerated     Mobility  Bed Mobility               General bed mobility comments: In recliner    Transfers Overall transfer level: Needs assistance Equipment used: Rolling walker (2 wheels) Transfers: Sit to/from Stand Sit  to Stand: Supervision           General transfer comment: Supervision for safety, cues for hand placement, slow to rise but without physical assistance.    Ambulation/Gait Ambulation/Gait assistance: Supervision Gait Distance (Feet): 200 Feet Assistive device: Rolling walker (2 wheels) Gait Pattern/deviations: Step-through pattern, Decreased stride length, Antalgic Gait velocity: decreased Gait velocity interpretation: <1.8 ft/sec, indicate of risk for recurrent falls   General Gait Details: Educated on gait symmetry and RW use for support to reduce pain and increase stride length. No bucking noted and no overt LOB with ambulatory bout.   Stairs             Wheelchair Mobility    Modified Rankin (Stroke Patients Only)       Balance Overall balance assessment: Needs assistance Sitting-balance support: Feet supported, No upper extremity supported Sitting balance-Leahy Scale: Good     Standing balance support: During functional activity, No upper extremity supported Standing balance-Leahy Scale: Fair                              Cognition Arousal/Alertness: Awake/alert Behavior During Therapy: WFL for tasks assessed/performed Overall Cognitive Status: Within Functional Limits for tasks assessed                                          Exercises Total Joint Exercises Ankle Circles/Pumps: AROM, Both, Supine, 5 reps Quad Sets: Both, Supine, Strengthening, 5 reps Short Arc Quad: Strengthening, Right, 5 reps, Seated Heel Slides: AAROM, Right, 5 reps, Seated Hip ABduction/ADduction:  Strengthening, Both, 5 reps, Seated Long Arc Quad: Right, 5 reps, Seated, Strengthening Knee Flexion: Strengthening, Right, 5 reps, Standing Marching in Standing: Strengthening, Right, 5 reps, Standing Standing Hip Extension: Strengthening, Right, 5 reps, Standing    General Comments        Pertinent Vitals/Pain Pain Assessment Pain Assessment:  Faces Faces Pain Scale: Hurts little more Pain Location: right hip Pain Descriptors / Indicators: Sore, Operative site guarding Pain Intervention(s): Monitored during session, Repositioned, Limited activity within patient's tolerance    Home Living                          Prior Function            PT Goals (current goals can now be found in the care plan section) Acute Rehab PT Goals Patient Stated Goal: to get back to playing golf PT Goal Formulation: With patient Time For Goal Achievement: 04/22/22 Potential to Achieve Goals: Good Progress towards PT goals: Progressing toward goals    Frequency    7X/week      PT Plan Current plan remains appropriate    Co-evaluation              AM-PAC PT "6 Clicks" Mobility   Outcome Measure  Help needed turning from your back to your side while in a flat bed without using bedrails?: A Little Help needed moving from lying on your back to sitting on the side of a flat bed without using bedrails?: A Little Help needed moving to and from a bed to a chair (including a wheelchair)?: A Little Help needed standing up from a chair using your arms (e.g., wheelchair or bedside chair)?: A Little Help needed to walk in hospital room?: A Little Help needed climbing 3-5 steps with a railing? : A Lot 6 Click Score: 17    End of Session Equipment Utilized During Treatment: Gait belt Activity Tolerance: Patient tolerated treatment well Patient left: with call bell/phone within reach;with family/visitor present;in chair Nurse Communication: Mobility status PT Visit Diagnosis: Pain;Difficulty in walking, not elsewhere classified (R26.2);Dizziness and giddiness (R42) Pain - Right/Left: Right Pain - part of body: Hip     Time: 5790-3833 PT Time Calculation (min) (ACUTE ONLY): 14 min  Charges:  $Gait Training: 8-22 mins                     Candie Mile, PT, DPT Physical Therapist Acute Rehabilitation Services [place] 04/09/2022, 10:19 AM

## 2022-04-09 NOTE — Discharge Summary (Signed)
Patient ID: Katherine Stanley MRN: [id number] DOB/AGE: 75-Nov-1947 75 y.o.  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    Arthritis    Diverticulosis    Headache    occasionally   Hypertension    takes Lisinopril daily   Joint pain    Osteopenia    PONV (postoperative nausea and vomiting)    Slight nausea   Rosacea     Surgeries: Procedure(s): RIGHT TOTAL HIP ARTHROPLASTY on 04/08/2022   Consultants:   Discharged Condition: Improved  Hospital Course: Katherine Stanley is an 76 y.o. female who was admitted 04/08/2022 for operative treatment ofUnilateral primary osteoarthritis, right hip. Patient has severe unremitting pain that affects sleep, daily activities, and work/hobbies. After pre-op clearance the patient was taken to the operating room on 04/08/2022 and underwent  Procedure(s): RIGHT TOTAL HIP ARTHROPLASTY.    Patient was given perioperative antibiotics:  Anti-infectives (From admission, onward)    Start     Dose/Rate Route Frequency Ordered Stop   04/08/22 1000  ceFAZolin (ANCEF) IVPB 1 g/50 mL premix        1 g 100 mL/hr over 30 Minutes Intravenous Every 6 hours 04/08/22 0956 04/08/22 1632   04/08/22 0601  ceFAZolin (ANCEF) 2-4 GM/100ML-% IVPB       Note to Pharmacy: Rocky Morel D: cabinet override      04/08/22 0601 04/08/22 0735   04/08/22 0600  ceFAZolin (ANCEF) IVPB 2g/100 mL premix        2 g 200 mL/hr over 30 Minutes Intravenous On call to O.R. 04/08/22 0557 04/08/22 0805        Patient was given sequential compression devices, early ambulation, and chemoprophylaxis to prevent DVT.  Patient benefited maximally from hospital stay and there were no  complications.    Recent vital signs: Patient Vitals for the past 24 hrs:  BP Temp Temp src Pulse Resp SpO2  04/09/22 0354 (!) 149/85 99.5 F (37.5 C) Oral (!) 104 18 95 %  04/08/22 2314 134/86 98.6 F (37 C) Oral 100 18 97 %  04/08/22 1939 (!) 146/75 98.4 F (36.9 C) Oral 98 18 97 %  04/08/22 1541 (!) 155/83 98.1 F (36.7 C) -- 89 16 98 %  04/08/22 1209 (!) 152/85 98 F (36.7 C) Oral 92 16 95 %  04/08/22 1012 106/88 98.1 F (36.7 C) Oral 89 18 98 %  04/08/22 0945 (!) 125/95 98 F (36.7 C) -- 97 20 97 %  04/08/22 0930 126/75 -- -- 84 15 96 %  04/08/22 0915 120/70 98 F (36.7 C) -- 91 13 100 %     Recent laboratory studies: No results for input(s): "WBC", "HGB", "HCT", "PLT", "NA", "K", "CL", "CO2", "BUN", "CREATININE", "GLUCOSE", "INR", "CALCIUM" in the last 72 hours.  Invalid input(s): "PT", "2"   Discharge Medications:   Allergies as of 04/09/2022       Reactions   Alendronate Other (See Comments)   Diffuse myalgia and arthralgia   Aleve [naproxen Sodium] Nausea And Vomiting   Sick to stomach        Medication List     STOP taking these medications    traMADol 50 MG tablet Commonly known as: Veatrice Bourbon  TAKE these medications    acetaminophen 650 MG CR tablet Commonly known as: TYLENOL Take 1,300 mg by mouth every 8 (eight) hours as needed for pain.   aspirin 81 MG chewable tablet Chew 1 tablet (81 mg total) by mouth 2 (two) times daily.   Calcium 250 MG Caps Take 250 mg by mouth daily.   cholecalciferol 1000 units tablet Commonly known as: VITAMIN D Take 1,000 Units by mouth daily.   CHOLEST OFF PO Take 1 tablet by mouth daily.   CoQ-10 100 MG Caps Take 100 mg by mouth daily.   cyanocobalamin 1000 MCG tablet Commonly known as: VITAMIN B12 Take 1,000 mcg by mouth daily.   diclofenac Sodium 1 % Gel Commonly known as: VOLTAREN Apply 1 application topically 4 (four) times daily as needed (pain).   ferrous gluconate 240 (27 FE) MG  tablet Commonly known as: FERGON Take 240 mg by mouth daily.   Krill Oil 350 MG Caps Take 350 mg by mouth daily.   lisinopril 10 MG tablet Commonly known as: ZESTRIL Take 1/2 (one-half) tablet by mouth once daily   Lysine 500 MG Caps Take 500 mg by mouth daily as needed (fever blisters).   methocarbamol 500 MG tablet Commonly known as: ROBAXIN Take 1 tablet (500 mg total) by mouth every 6 (six) hours as needed for muscle spasms.   multivitamin with minerals Tabs tablet Take 1 tablet by mouth daily.   mupirocin ointment 2 % Commonly known as: BACTROBAN Apply to affected areas and inside nose twice daily   OVER THE COUNTER MEDICATION Take 1 tablet by mouth daily. Beet root supplement   oxyCODONE 5 MG immediate release tablet Commonly known as: Oxy IR/ROXICODONE Take 1-2 tablets (5-10 mg total) by mouth every 6 (six) hours as needed for moderate pain (pain score 4-6). No more than 6 tablets per day.   Potassium 99 MG Tabs Take 99 mg by mouth daily.   PreserVision AREDS 2 Caps Take 1 capsule by mouth daily.   Retaine PM Oint Place 1 Application into both eyes See admin instructions. Apply to both eyes every morning, may use again as needed for dryness               Durable Medical Equipment  (From admission, onward)           Start     Ordered   04/08/22 1012  DME 3 n 1  Once        04/08/22 1011   04/08/22 1012  DME Walker rolling  Once       Question Answer Comment  Walker: With 5 Inch Wheels   Patient needs a walker to treat with the following condition Status post total replacement of right hip      04/08/22 1011            Diagnostic Studies: DG Pelvis Portable  Result Date: 04/08/2022 CLINICAL DATA:  Status post total right hip replacement EXAM: PORTABLE PELVIS 2 VIEWS COMPARISON:  Pelvis radiograph dated 11/21/2018 FINDINGS: Postsurgical changes from right hip arthroplasty. Hardware components appear intact and well seated. Prior left hip  arthroplasty without hardware complications. There is no evidence of pelvic fracture or diastasis. No pelvic bone lesions are seen. IMPRESSION: Postsurgical changes from right hip arthroplasty without hardware complications. Electronically Signed   By: Darrin Nipper M.D.   On: 04/08/2022 11:01   DG HIP UNILAT WITH PELVIS 2-3 VIEWS RIGHT  Result Date: 04/08/2022 CLINICAL DATA:  Provided history: Surgery, elective.  Total right anterior hip. Provided fluoroscopy time 14 seconds (1.87 mGy). EXAM: DG HIP (WITH OR WITHOUT PELVIS) 2-3V RIGHT COMPARISON:  MRI of the right hip 02/07/2022. Radiographs of the right hip 01/15/2022 (images available, report unavailable). FINDINGS: Intraoperative fluoroscopic images of the pelvis and bilateral hips are submitted, 5 images total. On the provided images, there are findings of interval right total hip arthroplasty. The right-sided femoral and acetabular components appear well seated. No unexpected finding on the provided images. Sign partially imaged left hip prosthesis. IMPRESSION: Five intraoperative fluoroscopic images from right total hip arthroplasty, as described. Electronically Signed   By: Kellie Simmering D.O.   On: 04/08/2022 09:04   DG C-Arm 1-60 Min-No Report  Result Date: 04/08/2022 Fluoroscopy was utilized by the requesting physician.  No radiographic interpretation.   DG C-Arm 1-60 Min-No Report  Result Date: 04/08/2022 Fluoroscopy was utilized by the requesting physician.  No radiographic interpretation.    Disposition: Discharge disposition: 01-Home or [place] [place] Follow up.   Specialty: Home Health Services Why: The home health agency will contact you for the first home visit. Contact information: [place] [address] [phone]         Mcarthur Rossetti, MD Follow up in 2 week(s).   Specialty: Orthopedic Surgery Contact information: [address] [place] [address] [phone]                  Signed: Mcarthur Rossetti 04/09/2022, 7:51 AM

## 2022-04-09 NOTE — Plan of Care (Signed)
  Problem: Education: Goal: Knowledge of the prescribed therapeutic regimen will improve Outcome: Completed/Met Goal: Understanding of discharge needs will improve Outcome: Completed/Met Goal: Individualized Educational Video(s) Outcome: Completed/Met   Problem: Activity: Goal: Ability to avoid complications of mobility impairment will improve Outcome: Completed/Met Goal: Ability to tolerate increased activity will improve Outcome: Completed/Met   Problem: Clinical Measurements: Goal: Postoperative complications will be avoided or minimized Outcome: Completed/Met   Problem: Pain Management: Goal: Pain level will decrease with appropriate interventions Outcome: Completed/Met  Patient alert and oriented x4, VSS, Void, ambulate. Surgical site clean and dry. D/c instructions explain and copy given to the patient all questions answered. Patient d/c home per order.

## 2022-04-09 NOTE — Discharge Instructions (Signed)

## 2022-04-09 NOTE — Progress Notes (Signed)
Subjective: 1 Day Post-Op Procedure(s) (LRB): RIGHT TOTAL HIP ARTHROPLASTY (Right) Patient reports pain as moderate.    Objective: Vital signs in last 24 hours: Temp:  [98 F (36.7 C)-99.5 F (37.5 C)] 99.5 F (37.5 C) (12/27 0354) Pulse Rate:  [84-104] 104 (12/27 0354) Resp:  [13-20] 18 (12/27 0354) BP: (106-155)/(70-95) 149/85 (12/27 0354) SpO2:  [95 %-100 %] 95 % (12/27 0354)  Intake/Output from previous day: 12/26 0701 - 12/27 0700 In: 2440 [P.O.:240; I.V.:2000; IV Piggyback:200] Out: 1225 [Urine:975; Blood:250] Intake/Output this shift: No intake/output data recorded.  No results for input(s): "HGB" in the last 72 hours. No results for input(s): "WBC", "RBC", "HCT", "PLT" in the last 72 hours. No results for input(s): "NA", "K", "CL", "CO2", "BUN", "CREATININE", "GLUCOSE", "CALCIUM" in the last 72 hours. No results for input(s): "LABPT", "INR" in the last 72 hours.  Sensation intact distally Intact pulses distally Dorsiflexion/Plantar flexion intact Incision: scant drainage   Assessment/Plan: 1 Day Post-Op Procedure(s) (LRB): RIGHT TOTAL HIP ARTHROPLASTY (Right) Up with therapy Discharge home with home health      Mcarthur Rossetti 04/09/2022, 7:49 AM

## 2022-04-10 ENCOUNTER — Telehealth: Payer: Self-pay | Admitting: *Deleted

## 2022-04-10 NOTE — Telephone Encounter (Signed)
D/C call to patient. She reports she is doing well. Has declined HHPT and having no issues. Noted in chart. This is her 2nd hip replacement and knows all exercises.

## 2022-04-16 ENCOUNTER — Telehealth: Payer: Self-pay | Admitting: *Deleted

## 2022-04-16 NOTE — Telephone Encounter (Signed)
Ortho bundle 7 day call completed. 

## 2022-04-22 ENCOUNTER — Encounter: Payer: Medicare Other | Admitting: Orthopaedic Surgery

## 2022-04-23 ENCOUNTER — Encounter: Payer: Self-pay | Admitting: Orthopaedic Surgery

## 2022-04-23 ENCOUNTER — Ambulatory Visit (INDEPENDENT_AMBULATORY_CARE_PROVIDER_SITE_OTHER): Payer: Medicare Other | Admitting: Orthopaedic Surgery

## 2022-04-23 ENCOUNTER — Telehealth: Payer: Self-pay | Admitting: *Deleted

## 2022-04-23 DIAGNOSIS — Z96641 Presence of right artificial hip joint: Secondary | ICD-10-CM

## 2022-04-23 NOTE — Telephone Encounter (Signed)
14 day Ortho bundle visit completed.

## 2022-04-23 NOTE — Progress Notes (Signed)
The patient is here today for first postoperative visit status post a right total hip arthroplasty.  She has had a left hip replacement and a right knee replacement.  She said she is doing great and did drive in today.  She is only on aspirin once a day which she was on prior to surgery.  She is only taking occasional Robaxin.  She does report a leg length difference.  When laying supine her right operative leg is definitely longer than the left leg.  This is likely permanent and will not settle out.  I recommended an insert for her left side but she does wear a lot of sandals and slippers and states that she would rather go without that for now.  Her right hip incision looks good and staples were removed and Steri-Strips applied.  I did aspirate a large seroma from her right hip removing about 80 cc of fluid.  This gave her good relief.  She will continue increase her activities as she tolerates.  I would like to see her back in 4 weeks to see how she is doing overall but no x-rays are needed.

## 2022-05-07 ENCOUNTER — Other Ambulatory Visit: Payer: Self-pay | Admitting: Orthopaedic Surgery

## 2022-05-07 ENCOUNTER — Encounter: Payer: Self-pay | Admitting: Orthopaedic Surgery

## 2022-05-07 MED ORDER — TIZANIDINE HCL 4 MG PO TABS: 4.0000 mg | ORAL_TABLET | Freq: Four times a day (QID) | ORAL | 0 refills | Status: DC | PRN

## 2022-05-07 MED ORDER — OXYCODONE HCL 5 MG PO TABS: 5.0000 mg | ORAL_TABLET | Freq: Four times a day (QID) | ORAL | 0 refills | Status: DC | PRN

## 2022-05-26 ENCOUNTER — Ambulatory Visit (INDEPENDENT_AMBULATORY_CARE_PROVIDER_SITE_OTHER): Payer: Medicare Other | Admitting: Orthopaedic Surgery

## 2022-05-26 ENCOUNTER — Encounter: Payer: Self-pay | Admitting: Orthopaedic Surgery

## 2022-05-26 DIAGNOSIS — Z96641 Presence of right artificial hip joint: Secondary | ICD-10-CM

## 2022-05-26 NOTE — Progress Notes (Signed)
The patient is now 6 weeks status post a right total hip arthroplasty.  She is an avid golfer at 37 and is already golf last week.  She does have a leg length difference with her left side little shorter than the right.  She has been able to get some inserts online and that is helped her quite a bit.  She really has no issues she states other than she does feel like she has a recurrent seroma on her right side.  She is walking without assistive device and no significant limp.  Her right operative hip moves smoothly and fluidly.  There is still a moderate seroma.  Incision looks good.  I was able to aspirate 80 cc of fluid off of her incision and she is very pleased with this.  Since this may recur, we can see her back in 2 weeks to aspirate the hip and if needed.  If she gets close to her 2-week visit and is doing much better, then she can reschedule that for actually 6 months.  At that visit in 6 months we will have a standing AP pelvis and lateral of her right hip.

## 2022-06-03 ENCOUNTER — Other Ambulatory Visit: Payer: Self-pay | Admitting: Family Medicine

## 2022-06-03 DIAGNOSIS — I10 Essential (primary) hypertension: Secondary | ICD-10-CM

## 2022-06-09 ENCOUNTER — Ambulatory Visit (INDEPENDENT_AMBULATORY_CARE_PROVIDER_SITE_OTHER): Payer: Medicare Other | Admitting: Physician Assistant

## 2022-06-09 ENCOUNTER — Encounter: Payer: Self-pay | Admitting: Physician Assistant

## 2022-06-09 DIAGNOSIS — Z96641 Presence of right artificial hip joint: Secondary | ICD-10-CM

## 2022-06-09 NOTE — Progress Notes (Signed)
HPI: Mrs. Philp returns today status post right total hip arthroplasty 04/08/2022.  She is overall doing well.  She states she has no pain in the hip whatsoever.  She is able to play golf twice last week.  She does note that the hip feels tight again and is wondering aspiration of the SEROMA.  She is taking no pain medication.  She is on chronic 81 mg aspirin.  Review of systems: See HPI otherwise negative  Physical exam: General well-developed well-nourished female who ambulates without any assistive device and a nonantalgic gait. Right hip: Good range of motion.  Surgical incisions healing well no signs of infection.  Slight seroma.  Areas prepped with Betadine and ethyl chloride and then 38 cc of serosanguineous fluids obtained patient tolerates well.  Impression: Status post right total hip arthroplasty 04/08/2022  Plan: Follow-up as needed or at return visit which is scheduled for June.  Questions were encouraged and answered at length.

## 2022-06-10 ENCOUNTER — Telehealth: Payer: Self-pay | Admitting: *Deleted

## 2022-06-10 NOTE — Telephone Encounter (Signed)
Met with patient in office on 06/09/22 during post op with PA. Ortho bundle meeting completed.

## 2022-06-19 ENCOUNTER — Encounter: Payer: Self-pay | Admitting: Radiology

## 2022-07-28 ENCOUNTER — Ambulatory Visit (INDEPENDENT_AMBULATORY_CARE_PROVIDER_SITE_OTHER): Payer: Medicare Other | Admitting: Dermatology

## 2022-07-28 ENCOUNTER — Encounter: Payer: Self-pay | Admitting: Dermatology

## 2022-07-28 VITALS — BP 155/92 | HR 94

## 2022-07-28 DIAGNOSIS — D2239 Melanocytic nevi of other parts of face: Secondary | ICD-10-CM | POA: Diagnosis not present

## 2022-07-28 DIAGNOSIS — B009 Herpesviral infection, unspecified: Secondary | ICD-10-CM | POA: Diagnosis not present

## 2022-07-28 DIAGNOSIS — L814 Other melanin hyperpigmentation: Secondary | ICD-10-CM | POA: Diagnosis not present

## 2022-07-28 DIAGNOSIS — D229 Melanocytic nevi, unspecified: Secondary | ICD-10-CM

## 2022-07-28 DIAGNOSIS — L578 Other skin changes due to chronic exposure to nonionizing radiation: Secondary | ICD-10-CM

## 2022-07-28 DIAGNOSIS — Z1283 Encounter for screening for malignant neoplasm of skin: Secondary | ICD-10-CM | POA: Diagnosis not present

## 2022-07-28 DIAGNOSIS — L821 Other seborrheic keratosis: Secondary | ICD-10-CM

## 2022-07-28 DIAGNOSIS — L918 Other hypertrophic disorders of the skin: Secondary | ICD-10-CM | POA: Diagnosis not present

## 2022-07-28 DIAGNOSIS — D1801 Hemangioma of skin and subcutaneous tissue: Secondary | ICD-10-CM

## 2022-07-28 MED ORDER — ACYCLOVIR 5 % EX OINT: TOPICAL_OINTMENT | CUTANEOUS | 5 refills | Status: DC

## 2022-07-28 NOTE — Patient Instructions (Addendum)
Start Acyclovir ointment 5 times daily to affected area as needed for fever blister.    Recommend daily broad spectrum sunscreen SPF 30+ to sun-exposed areas, reapply every 2 hours as needed. Call for new or changing lesions.  Staying in the shade or wearing long sleeves, sun glasses (UVA+UVB protection) and wide brim hats (4-inch brim around the entire circumference of the hat) are also recommended for sun protection.   Melanoma ABCDEs  Melanoma is the most dangerous type of skin cancer, and is the leading cause of death from skin disease.  You are more likely to develop melanoma if you: Have light-colored skin, light-colored eyes, or red or blond hair Spend a lot of time in the sun Tan regularly, either outdoors or in a tanning bed Have had blistering sunburns, especially during childhood Have a close family member who has had a melanoma Have atypical moles or large birthmarks  Early detection of melanoma is key since treatment is typically straightforward and cure rates are extremely high if we catch it early.   The first sign of melanoma is often a change in a mole or a new dark spot.  The ABCDE system is a way of remembering the signs of melanoma.  A for asymmetry:  The two halves do not match. B for border:  The edges of the growth are irregular. C for color:  A mixture of colors are present instead of an even brown color. D for diameter:  Melanomas are usually (but not always) greater than 68mm - the size of a pencil eraser. E for evolution:  The spot keeps changing in size, shape, and color.  Please check your skin once per month between visits. You can use a small mirror in front and a large mirror behind you to keep an eye on the back side or your body.   If you see any new or changing lesions before your next follow-up, please call to schedule a visit.  Please continue daily skin protection including broad spectrum sunscreen SPF 30+ to sun-exposed areas, reapplying every 2  hours as needed when you're outdoors.   Staying in the shade or wearing long sleeves, sun glasses (UVA+UVB protection) and wide brim hats (4-inch brim around the entire circumference of the hat) are also recommended for sun protection.    Due to recent changes in healthcare laws, you may see results of your pathology and/or laboratory studies on MyChart before the doctors have had a chance to review them. We understand that in some cases there may be results that are confusing or concerning to you. Please understand that not all results are received at the same time and often the doctors may need to interpret multiple results in order to provide you with the best plan of care or course of treatment. Therefore, we ask that you please give [place] 2 business days to thoroughly review all your results before contacting the office for clarification. Should we see a critical lab result, you will be contacted sooner.   If You Need Anything After Your Visit  If you have any questions or concerns for your doctor, please call our main line at [phone] and press option 4 to reach your doctor's medical assistant. If no one answers, please leave a voicemail as directed and we will return your call as soon as possible. Messages left after 4 pm will be answered the following business day.   You may also send [place] a message via MyChart. We typically respond to MyChart  messages within 1-2 business days.  For prescription refills, please ask your pharmacy to contact our office. Our fax number is [phone].  If you have an urgent issue when the clinic is closed that cannot wait until the next business day, you can page your doctor at the number below.    Please note that while we do our best to be available for urgent issues outside of office hours, we are not available 24/7.   If you have an urgent issue and are unable to reach [place], you may choose to seek medical care at your doctor's office, retail clinic, urgent  care center, or emergency room.  If you have a medical emergency, please immediately call 911 or go to the emergency department.  Pager Numbers  - Dr. Nehemiah Massed: [phone]  - Dr. Laurence Ferrari: [phone]  - Dr. Nicole Kindred: [phone]  In the event of inclement weather, please call our main line at [phone] for an update on the status of any delays or closures.  Dermatology Medication Tips: Please keep the boxes that topical medications come in in order to help keep track of the instructions about where and how to use these. Pharmacies typically print the medication instructions only on the boxes and not directly on the medication tubes.   If your medication is too expensive, please contact our office at [phone] option 4 or send [place] a message through [place].   We are unable to tell what your co-pay for medications will be in advance as this is different depending on your insurance coverage. However, we may be able to find a substitute medication at lower cost or fill out paperwork to get insurance to cover a needed medication.   If a prior authorization is required to get your medication covered by your insurance company, please allow [place] 1-2 business days to complete this process.  Drug prices often vary depending on where the prescription is filled and some pharmacies may offer cheaper prices.  The website www.goodrx.com contains coupons for medications through different pharmacies. The prices here do not account for what the cost may be with help from insurance (it may be cheaper with your insurance), but the website can give you the price if you did not use any insurance.  - You can print the associated coupon and take it with your prescription to the pharmacy.  - You may also stop by our office during regular business hours and pick up a GoodRx coupon card.  - If you need your prescription sent electronically to a different pharmacy, notify our office through [facility] or  by phone at [phone] option 4.     Si Usted Necesita Algo Despus de Su Visita  Tambin puede enviarnos un mensaje a travs de Pharmacist, community. Por lo general respondemos a los mensajes de MyChart en el transcurso de 1 a 2 das hbiles.  Para renovar recetas, por favor pida a su farmacia que se ponga en contacto con nuestra oficina. Harland Dingwall de fax es [place] [phone].  Si tiene un asunto urgente cuando la clnica est cerrada y que no puede esperar hasta el siguiente da hbil, puede llamar/localizar a su doctor(a) al nmero que aparece a continuacin.   Por favor, tenga en cuenta que aunque hacemos todo lo posible para estar disponibles para asuntos urgentes fuera del horario de [place], no estamos disponibles las 24 horas del da, los 7 das de la [place].   Si tiene un problema urgente y no puede comunicarse con nosotros, puede optar  por buscar atencin mdica  en el consultorio de su doctor(a), en una clnica privada, en un centro de atencin urgente o en una sala de emergencias.  Si tiene Engineer, drilling, por favor llame inmediatamente al 911 o vaya a la sala de emergencias.  Nmeros de bper  - Dr. Gwen Pounds: [phone]  - Dra. Moye: [phone]  - Dra. Roseanne Reno: [phone]  En caso de inclemencias del [place], por favor llame a Lacy Duverney principal al [phone] para una actualizacin sobre el [place] de cualquier retraso o cierre.  Consejos para la medicacin en dermatologa: Por favor, guarde las cajas en las que vienen los medicamentos de uso tpico para ayudarle a seguir las instrucciones sobre dnde y cmo usarlos. Las farmacias generalmente imprimen las instrucciones del medicamento slo en las cajas y no directamente en los tubos del [place].   Si su medicamento es muy caro, por favor, pngase en contacto con Rolm Gala llamando al [phone] y presione la opcin 4 o envenos un mensaje a travs de Clinical cytogeneticist.   No podemos decirle cul ser su  copago por los medicamentos por adelantado ya que esto es diferente dependiendo de la cobertura de su seguro. Sin embargo, es posible que podamos encontrar un medicamento sustituto a Audiological scientist un formulario para que el seguro cubra el medicamento que se considera necesario.   Si se requiere una autorizacin previa para que su compaa de seguros [place] su medicamento, por favor permtanos de 1 a 2 das hbiles para completar [address]  Los precios de los medicamentos varan con frecuencia dependiendo del Environmental consultant de dnde se surte la receta y alguna farmacias pueden ofrecer precios ms baratos.  El sitio web www.goodrx.com tiene cupones para medicamentos de Health and safety inspector. Los precios aqu no tienen en cuenta lo que podra costar con la ayuda del seguro (puede ser ms barato con su seguro), pero el sitio web puede darle el precio si no utiliz Tourist information centre manager.  - Puede imprimir el cupn correspondiente y llevarlo con su receta a la farmacia.  - Tambin puede pasar por nuestra oficina durante el horario de atencin regular y Education officer, museum una tarjeta de cupones de GoodRx.  - Si necesita que su receta se enve electrnicamente a una farmacia diferente, informe a nuestra oficina a travs de MyChart de  o por telfono llamando al [phone] y presione la opcin 4.

## 2022-07-28 NOTE — Progress Notes (Signed)
Follow-Up Visit   Subjective  Katherine Stanley is a 77 y.o. female who presents for the following: Skin Cancer Screening and Full Body Skin Exam  The patient presents for Total-Body Skin Exam (TBSE) for skin cancer screening and mole check. The patient has spots, moles and lesions to be evaluated, some may be new or changing and the patient has concerns that these could be cancer. New spot below her eye, not bothersome.    The following portions of the chart were reviewed this encounter and updated as appropriate: medications, allergies, medical history  Review of Systems:  No other skin or systemic complaints except as noted in HPI or Assessment and Plan.  Objective  Well appearing patient in no apparent distress; mood and affect are within normal limits.  A full examination was performed including scalp, head, eyes, ears, nose, lips, neck, chest, axillae, abdomen, back, buttocks, bilateral upper extremities, bilateral lower extremities, hands, feet, fingers, toes, fingernails, and toenails. All findings within normal limits unless otherwise noted below.   Relevant physical exam findings are noted in the Assessment and Plan.        Assessment & Plan   LENTIGINES, SEBORRHEIC KERATOSES, HEMANGIOMAS - Benign normal skin lesions - Benign-appearing - Call for any changes  MELANOCYTIC NEVI - Tan-brown and/or pink-flesh-colored symmetric macules and papules - Benign appearing on exam today - Observation - Call clinic for new or changing moles - Recommend daily use of broad spectrum spf 30+ sunscreen to sun-exposed areas.   ACTINIC DAMAGE - Chronic condition, secondary to cumulative UV/sun exposure - diffuse scaly erythematous macules with underlying dyspigmentation - Recommend daily broad spectrum sunscreen SPF 30+ to sun-exposed areas, reapply every 2 hours as needed.  - Staying in the shade or wearing long sleeves, sun glasses (UVA+UVB protection) and wide brim hats (4-inch  brim around the entire circumference of the hat) are also recommended for sun protection.  - Call for new or changing lesions.  Nevus  - 6.23mm pink flesh pap R malar cheek, present for years, no changes  - Benign, observe - Call for any changes   SKIN CANCER SCREENING PERFORMED TODAY.  SEBORRHEIC KERATOSIS Left cheek, right infraocular- see photo - Small waxy, yellow tan papules  - Benign-appearing - Discussed benign etiology and prognosis. - Observe - Call for any changes Patient deferred treatment at this time.   Acrochordons (Skin Tags) Neck, abdomen. - Fleshy, skin-colored pedunculated papules - Benign appearing.  - Observe. - If desired, they can be removed with an in office procedure that is not covered by insurance. - Please call the clinic if you notice any new or changing lesions.  - Bothersome to patient but defers treatment today.    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 Condition is bothersome/symptomatic for patient. Currently flared.   Herpes Simplex Virus = Cold Sores = Fever Blisters is a chronic recurring blistering; scabbing sore-producing viral infection that is recurrent usually in the same area triggered by stress, sun/UV exposure and trauma.  It is infectious and can be spread from person to person by direct contact.  It is not curable, but is treatable with topical and oral medication.  Treatment Plan Start Acyclovir ointment 5 times daily to affected area as needed for fever blister.    Return in about 1 year (around 07/28/2023) for TBSE.  I, Lawson Radar, CMA, am acting  as scribe for Willeen Niece, MD.   Documentation: I have reviewed the above documentation for accuracy and completeness, and I agree with the above.  Willeen Niece, MD

## 2022-08-27 ENCOUNTER — Telehealth: Payer: Self-pay | Admitting: Family Medicine

## 2022-08-27 NOTE — Telephone Encounter (Signed)
Contacted Katherine Stanley to schedule their annual wellness visit. Appointment made for 10/10/2022.  [name]; Care Guide Ambulatory Clinical Support [place] [facility] Direct Dial: [phone]

## 2022-09-03 ENCOUNTER — Other Ambulatory Visit: Payer: Self-pay | Admitting: Family Medicine

## 2022-09-03 DIAGNOSIS — I10 Essential (primary) hypertension: Secondary | ICD-10-CM

## 2022-09-03 NOTE — Telephone Encounter (Signed)
She needs a visit scheduled for this to be refilled.  She needs lab work before this being refilled.  Thanks.

## 2022-09-05 ENCOUNTER — Other Ambulatory Visit: Payer: Self-pay | Admitting: Family Medicine

## 2022-09-05 DIAGNOSIS — I10 Essential (primary) hypertension: Secondary | ICD-10-CM

## 2022-09-09 ENCOUNTER — Encounter: Payer: Self-pay | Admitting: Family Medicine

## 2022-09-09 NOTE — Telephone Encounter (Signed)
Please contact the patient and confirm her dose of this medication. The last dose I have in my note is 5 mg daily, though this is a request for 10 mg tablets. Thanks.

## 2022-09-10 NOTE — Telephone Encounter (Signed)
Prescription Request  09/10/2022  LOV: 11/04/2021  What is the name of the medication or equipment? lisinopril (ZESTRIL) 10 MG tablet  Have you contacted your pharmacy to request a refill? Yes   Which pharmacy would you like this sent to?  [facility] [address] [place] - [address] [place] [address] Phone: [phone] Fax: [phone]    Patient notified that their request is being sent to the clinical staff for review and that they should receive a response within 2 business days.   Please advise at [facility] [phone]

## 2022-09-11 MED ORDER — LISINOPRIL 10 MG PO TABS: ORAL_TABLET | ORAL | 0 refills | Status: DC

## 2022-09-11 NOTE — Telephone Encounter (Signed)
Patient called and is upset because she has been trying to get this medication refill, lisinopril (ZESTRIL) 10 MG tablet since last week. Note was read from Dr Birdie Sons. She stated she gets the 10mg  tablets and cuts them in half. Patient only has 1 pill left.

## 2022-09-11 NOTE — Telephone Encounter (Signed)
Dosing direction on med list is accurate based on pt response.  Therefore I will send in a 30 day refill & notify patient to schedule a f/u.  Pt overdue for an appt. Was due for a 6 mth f/u in January. Pt stated she will call back for an appt.  15 tablets sent to pharmacy & pt aware

## 2022-09-11 NOTE — Telephone Encounter (Signed)
Prescription sent in on 09/11/22

## 2022-09-12 ENCOUNTER — Encounter: Payer: Self-pay | Admitting: Family Medicine

## 2022-09-12 ENCOUNTER — Ambulatory Visit (INDEPENDENT_AMBULATORY_CARE_PROVIDER_SITE_OTHER): Payer: Medicare Other | Admitting: Family Medicine

## 2022-09-12 VITALS — BP 124/78 | HR 100 | Temp 98.3°F | Ht 60.0 in | Wt 169.0 lb

## 2022-09-12 DIAGNOSIS — R7303 Prediabetes: Secondary | ICD-10-CM | POA: Diagnosis not present

## 2022-09-12 DIAGNOSIS — I10 Essential (primary) hypertension: Secondary | ICD-10-CM | POA: Diagnosis not present

## 2022-09-12 DIAGNOSIS — Z8781 Personal history of (healed) traumatic fracture: Secondary | ICD-10-CM | POA: Diagnosis not present

## 2022-09-12 DIAGNOSIS — M8580 Other specified disorders of bone density and structure, unspecified site: Secondary | ICD-10-CM

## 2022-09-12 DIAGNOSIS — Z780 Asymptomatic menopausal state: Secondary | ICD-10-CM

## 2022-09-12 LAB — COMPREHENSIVE METABOLIC PANEL
ALT: 19 U/L (ref 0–35)
AST: 19 U/L (ref 0–37)
Albumin: 4.3 g/dL (ref 3.5–5.2)
Alkaline Phosphatase: 80 U/L (ref 39–117)
BUN: 19 mg/dL (ref 6–23)
CO2: 23 mEq/L (ref 19–32)
Calcium: 9.4 mg/dL (ref 8.4–10.5)
Chloride: 107 mEq/L (ref 96–112)
Creatinine, Ser: 0.64 mg/dL (ref 0.40–1.20)
GFR: 86.33 mL/min (ref >60.00)
Glucose, Bld: 111 mg/dL — ABNORMAL HIGH (ref 70–99)
Potassium: 4.3 mEq/L (ref 3.5–5.1)
Sodium: 137 mEq/L (ref 135–145)
Total Bilirubin: 0.4 mg/dL (ref 0.2–1.2)
Total Protein: 7.2 g/dL (ref 6.0–8.3)

## 2022-09-12 LAB — LIPID PANEL
Cholesterol: 149 mg/dL (ref 0–200)
HDL: 45.6 mg/dL (ref >39.00)
NonHDL: 103.82
Total CHOL/HDL Ratio: 3
Triglycerides: 223 mg/dL — ABNORMAL HIGH (ref 0.0–149.0)
VLDL: 44.6 mg/dL — ABNORMAL HIGH (ref 0.0–40.0)

## 2022-09-12 LAB — LDL CHOLESTEROL, DIRECT: Direct LDL: 80 mg/dL

## 2022-09-12 LAB — HEMOGLOBIN A1C: Hgb A1c MFr Bld: 5.9 % (ref 4.6–6.5)

## 2022-09-12 MED ORDER — LISINOPRIL 10 MG PO TABS: ORAL_TABLET | ORAL | 2 refills | Status: DC

## 2022-09-12 NOTE — Assessment & Plan Note (Signed)
Chronic issue. Well controlled. Continue lisinopril 5 mg daily.

## 2022-09-12 NOTE — Assessment & Plan Note (Signed)
Chronic issue. Patient will continue calcium and vitamin D supplementation. Given her recent hip fracture we will check a DEXA scan. Patient will call to schedule this.

## 2022-09-12 NOTE — Progress Notes (Signed)
Marikay Alar, MD Phone: [phone]  Katherine Stanley is a 77 y.o. female who presents today for f/u.  HYPERTENSION Disease Monitoring Home BP Monitoring 110s/60s-70s Chest pain- no    Dyspnea- no Medications Compliance-  taking lisinopril 5 mg daily.  Edema- no BMET    Component Value Date/Time   NA 136 04/09/2022 0727   K 3.5 04/09/2022 0727   CL 104 04/09/2022 0727   CO2 22 04/09/2022 0727   GLUCOSE 145 (H) 04/09/2022 0727   BUN 11 04/09/2022 0727   CREATININE 0.61 04/09/2022 0727   CALCIUM 9.1 04/09/2022 0727   GFRNONAA >60 04/09/2022 0727   GFRAA >60 03/20/2016 0432   Osteopenia: Patient reports recent hip replacement for a hip fracture.  This was found on MRI after a fall.  She has been on vitamin D and calcium.  Social History   Tobacco Use  Smoking Status Never  Smokeless Tobacco Never    Current Outpatient Medications on File Prior to Visit  Medication Sig Dispense Refill   acetaminophen (TYLENOL) 650 MG CR tablet Take 1,300 mg by mouth every 8 (eight) hours as needed for pain.     aspirin 81 MG chewable tablet Chew 1 tablet (81 mg total) by mouth 2 (two) times daily. 30 tablet 0   Calcium 250 MG CAPS Take 250 mg by mouth daily.     cholecalciferol (VITAMIN D) 1000 units tablet Take 1,000 Units by mouth daily.     Coenzyme Q10 (COQ-10) 100 MG CAPS Take 100 mg by mouth daily.     diclofenac Sodium (VOLTAREN) 1 % GEL Apply 1 application topically 4 (four) times daily as needed (pain).     ferrous gluconate (FERGON) 240 (27 FE) MG tablet Take 240 mg by mouth daily.     Krill Oil 350 MG CAPS Take 350 mg by mouth daily.     Lysine 500 MG CAPS Take 500 mg by mouth daily as needed (fever blisters).     Multiple Vitamin (MULTIVITAMIN WITH MINERALS) TABS tablet Take 1 tablet by mouth daily.     Multiple Vitamins-Minerals (PRESERVISION AREDS 2) CAPS Take 1 capsule by mouth daily.     mupirocin ointment (BACTROBAN) 2 % Apply to affected areas and inside nose twice  daily 22 g 1   OVER THE COUNTER MEDICATION Take 1 tablet by mouth daily. Beet root supplement     Plant Sterols and Stanols (CHOLEST OFF PO) Take 1 tablet by mouth daily.     Potassium 99 MG TABS Take 99 mg by mouth daily.     vitamin B-12 (CYANOCOBALAMIN) 1000 MCG tablet Take 1,000 mcg by mouth daily.     White Petrolatum-Mineral Oil (RETAINE PM) OINT Place 1 Application into both eyes See admin instructions. Apply to both eyes every morning, may use again as needed for dryness     No current facility-administered medications on file prior to visit.     ROS see history of present illness  Objective  Physical Exam Vitals:   09/12/22 1105 09/12/22 1120  BP: 124/78   Pulse: (!) 113 100  Temp: 98.3 F (36.8 C)   SpO2: 97%     BP Readings from Last 3 Encounters:  09/12/22 124/78  07/28/22 (!) 155/92  04/09/22 (!) 160/76   Wt Readings from Last 3 Encounters:  09/12/22 169 lb (76.7 kg)  04/08/22 166 lb (75.3 kg)  04/01/22 169 lb 6.4 oz (76.8 kg)    Physical Exam Constitutional:      General:  She is not in acute distress.    Appearance: She is not diaphoretic.  Cardiovascular:     Rate and Rhythm: Normal rate and regular rhythm.     Heart sounds: Normal heart sounds.  Pulmonary:     Effort: Pulmonary effort is normal.     Breath sounds: Normal breath sounds.  Skin:    General: Skin is warm and dry.  Neurological:     Mental Status: She is alert.      Assessment/Plan: Please see individual problem list.  Primary hypertension Assessment & Plan: Chronic issue. Well controlled. Continue lisinopril 5 mg daily.  Orders: -     Comprehensive metabolic panel -     Lipid panel -     Lisinopril; Take 1/2 (one-half) tablet by mouth once daily Strength: 10 mg  Dispense: 45 tablet; Refill: 2  Prediabetes -     Hemoglobin A1c  Osteopenia, unspecified location Assessment & Plan: Chronic issue. Patient will continue calcium and vitamin D supplementation. Given her recent  hip fracture we will check a DEXA scan. Patient will call to schedule this.   Orders: -     DG Bone Density; Future  S/P right hip fracture -     DG Bone Density; Future  Postmenopausal estrogen deficiency -     DG Bone Density; Future     Return in about 6 months (around 03/14/2023) for htn.   Marikay Alar, MD [facility]

## 2022-09-12 NOTE — Patient Instructions (Signed)
Nice to see you. Please call [phone] to set up your bone density scan.

## 2022-10-06 ENCOUNTER — Other Ambulatory Visit (INDEPENDENT_AMBULATORY_CARE_PROVIDER_SITE_OTHER): Payer: Medicare Other

## 2022-10-06 ENCOUNTER — Encounter: Payer: Self-pay | Admitting: Orthopaedic Surgery

## 2022-10-06 ENCOUNTER — Ambulatory Visit (INDEPENDENT_AMBULATORY_CARE_PROVIDER_SITE_OTHER): Payer: Medicare Other | Admitting: Orthopaedic Surgery

## 2022-10-06 DIAGNOSIS — Z96641 Presence of right artificial hip joint: Secondary | ICD-10-CM | POA: Diagnosis not present

## 2022-10-06 NOTE — Progress Notes (Signed)
The patient comes in today for follow-up after having her right hip replaced 6 months ago.  We replaced her left hip remotely as well.  She is an avid golfer and is active at 77 years old.  She has no issues at all.  She reports good range of motion and strength.  She is walking with a normal gait.  Both hips move smoothly and fluidly with no issues at all.  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  At this point follow-up for hips can be as needed.  She understands that if she develops any issues at all not to hesitate to reach out to [place].

## 2022-10-10 ENCOUNTER — Ambulatory Visit (INDEPENDENT_AMBULATORY_CARE_PROVIDER_SITE_OTHER): Payer: Medicare Other | Admitting: *Deleted

## 2022-10-10 VITALS — BP 122/63 | Ht 60.25 in | Wt 162.0 lb

## 2022-10-10 DIAGNOSIS — Z0000 Encounter for general adult medical examination without abnormal findings: Secondary | ICD-10-CM

## 2022-10-10 NOTE — Progress Notes (Signed)
Subjective:      Subjective:   Katherine Stanley is a 77 y.o. female who presents for Medicare Annual (Subsequent) preventive examination.  Visit Complete: Virtual  I connected with  Katherine Stanley on 10/10/22 by a audio enabled telemedicine application and verified that I am speaking with the correct person using two identifiers.  Patient Location: Home  Provider Location: Office/Clinic  I discussed the limitations of evaluation and management by telemedicine. The patient expressed understanding and agreed to proceed.  Patient Medicare AWV questionnaire was completed by the patient on 10/09/22; I have confirmed that all information answered by patient is correct and no changes since this date.  Review of Systems     Cardiac Risk Factors include: hypertension;advanced age (>74men, >4 women);obesity (BMI >30kg/m2)     Objective:    Today's Vitals   10/10/22 0818  BP: 122/63  SpO2: 92%  Weight: 162 lb (73.5 kg)  Height: 5' 0.25" (1.53 m)   Body mass index is 31.38 kg/m.     10/10/2022    8:39 AM 04/08/2022    6:23 AM 04/01/2022    8:54 AM 09/20/2021    8:40 AM 07/02/2021    7:00 AM 11/21/2020    8:00 AM 11/20/2020    4:10 PM  Advanced Directives  Does Patient Have a Medical Advance Directive? Yes No Yes Yes Yes  Yes  Type of Estate agent of [place];Living will  Healthcare Power of [place];Living will Healthcare Power of [place];Living will Healthcare Power of [place];Living will Healthcare Power of [place];Living will Living will;Healthcare Power of Attorney  Does patient want to make changes to medical advance directive?    No - Patient declined No - Patient declined Yes (Inpatient - patient requests chaplain consult to change a medical advance directive)   Copy of Healthcare Power of Attorney in Chart? No - copy requested   No - copy requested No - copy requested  No - copy requested    Current Medications (verified) Outpatient Encounter  Medications as of 10/10/2022  Medication Sig   acetaminophen (TYLENOL) 650 MG CR tablet Take 1,300 mg by mouth every 8 (eight) hours as needed for pain.   aspirin 81 MG chewable tablet Chew 1 tablet (81 mg total) by mouth 2 (two) times daily.   cholecalciferol (VITAMIN D) 1000 units tablet Take 1,000 Units by mouth daily.   Coenzyme Q10 (COQ-10) 100 MG CAPS Take 100 mg by mouth daily.   diclofenac Sodium (VOLTAREN) 1 % GEL Apply 1 application topically 4 (four) times daily as needed (pain).   ferrous gluconate (FERGON) 240 (27 FE) MG tablet Take 240 mg by mouth daily.   Krill Oil 350 MG CAPS Take 350 mg by mouth daily.   lisinopril (ZESTRIL) 10 MG tablet Take 1/2 (one-half) tablet by mouth once daily Strength: 10 mg   Lysine 500 MG CAPS Take 500 mg by mouth daily as needed (fever blisters).   Multiple Vitamin (MULTIVITAMIN WITH MINERALS) TABS tablet Take 1 tablet by mouth daily.   Multiple Vitamins-Minerals (PRESERVISION AREDS 2) CAPS Take 1 capsule by mouth daily.   mupirocin ointment (BACTROBAN) 2 % Apply to affected areas and inside nose twice daily   OVER THE COUNTER MEDICATION Take 1 tablet by mouth daily. Beet root supplement   Plant Sterols and Stanols (CHOLEST OFF PO) Take 1 tablet by mouth daily.   Potassium 99 MG TABS Take 99 mg by mouth daily.   vitamin B-12 (CYANOCOBALAMIN) 1000 MCG tablet Take  1,000 mcg by mouth daily.   White Petrolatum-Mineral Oil (RETAINE PM) OINT Place 1 Application into both eyes See admin instructions. Apply to both eyes every morning, may use again as needed for dryness   Calcium 250 MG CAPS Take 250 mg by mouth daily. (Patient not taking: Reported on 10/10/2022)   No facility-administered encounter medications on file as of 10/10/2022.    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    Arthritis    Diverticulosis     Headache    occasionally   Hypertension    takes Lisinopril daily   Joint pain    Osteopenia    PONV (postoperative nausea and vomiting)    Slight nausea   Rosacea    Past Surgical History:  Procedure Laterality Date   CATARACT EXTRACTION W/PHACO Left 07/02/2021   Procedure: CATARACT EXTRACTION PHACO AND INTRAOCULAR LENS PLACEMENT (IOC) LEFT EYHANCE 6.84 00:46.3;  Surgeon: Galen Manila, MD;  Location: [facility];  Service: Ophthalmology;  Laterality: Left;   CATARACT EXTRACTION W/PHACO Right 07/16/2021   Procedure: CATARACT EXTRACTION PHACO AND INTRAOCULAR LENS PLACEMENT (IOC) RIGHT EYHANCE 5.89 00:51.9;  Surgeon: Galen Manila, MD;  Location: [facility];  Service: Ophthalmology;  Laterality: Right;   CHOLECYSTECTOMY     CHOLECYSTECTOMY     COLONOSCOPY     COLONOSCOPY WITH PROPOFOL N/A 11/25/2019   Procedure: COLONOSCOPY WITH PROPOFOL;  Surgeon: Pasty Spillers, MD;  Location: [facility];  Service: Gastroenterology;  Laterality: N/A;   EYE SURGERY  2013   LASIK   JOINT REPLACEMENT     REFRACTIVE SURGERY     SHOULDER ARTHROSCOPY WITH OPEN ROTATOR CUFF REPAIR AND DISTAL CLAVICLE ACROMINECTOMY Right 09/19/2014   Procedure: SHOULDER ARTHROSCOPY WITH MINI-OPEN ROTATOR CUFF REPAIR AND DISTAL CLAVICLE RESECTION, SUBACROMIAL DECOMPRESSION;  Surgeon: Valeria Batman, MD;  Location: [facility];  Service: Orthopedics;  Laterality: Right;   TONSILLECTOMY  at age 42   TOTAL HIP ARTHROPLASTY Left 11/20/2020   Procedure: LEFT TOTAL HIP ARTHROPLASTY ANTERIOR APPROACH;  Surgeon: Kathryne Hitch, MD;  Location: [facility];  Service: Orthopedics;  Laterality: Left;  RNFA   TOTAL HIP ARTHROPLASTY Right 04/08/2022   Procedure: RIGHT TOTAL HIP ARTHROPLASTY;  Surgeon: Kathryne Hitch, MD;  Location: [facility] OR;  Service: Orthopedics;  Laterality: Right;  Needs RNFA   TOTAL KNEE ARTHROPLASTY Right 03/18/2016   TOTAL KNEE ARTHROPLASTY Right 03/18/2016   Procedure:  TOTAL KNEE ARTHROPLASTY;  Surgeon: Valeria Batman, MD;  Location: [facility] OR;  Service: Orthopedics;  Laterality: Right;   VAGINAL HYSTERECTOMY  at age 38   Family History  Problem Relation Age of Onset   Hypertension Mother    Heart disease Mother    Kidney disease Mother    Diabetes Father    Hypertension Father    Heart disease Father    Heart disease Brother    Cancer Maternal Aunt    Ovarian cancer Maternal Aunt 58   Stroke Brother    Alzheimer's disease Brother    Diabetes Paternal Grandmother    Breast cancer Neg Hx    Social History   Socioeconomic History   Marital status: Married    Spouse name: Not on file   Number of children: Not on file   Years of education: Not on file   Highest education level: Associate degree: occupational, Scientist, product/process development, or vocational program  Occupational History  Not on file  Tobacco Use   Smoking status: Never   Smokeless tobacco: Never  Vaping Use   Vaping Use: Never used  Substance and Sexual Activity   Alcohol use: No    Alcohol/week: 0.0 standard drinks of alcohol   Drug use: No   Sexual activity: Yes    Birth control/protection: Post-menopausal, Surgical  Other Topics Concern   Not on file  Social History Narrative   Lives in [place] with husband. No children. No pets.      Work - retired Nature conservation officer   Diet - regular   Exercise - golf, gardening   Social Determinants of Health   Financial Resource Strain: Low Risk  (10/10/2022)   Overall Financial Resource Strain (CARDIA)    Difficulty of Paying Living Expenses: Not hard at all  Food Insecurity: No Food Insecurity (10/10/2022)   Hunger Vital Sign    Worried About Running Out of Food in the Last Year: Never true    Ran Out of Food in the Last Year: Never true  Transportation Needs: No Transportation Needs (10/10/2022)   PRAPARE - Administrator, Civil Service (Medical): No    Lack of Transportation (Non-Medical): No  Physical Activity: Sufficiently  Active (10/10/2022)   Exercise Vital Sign    Days of Exercise per Week: 5 days    Minutes of Exercise per Session: 150+ min  Stress: No Stress Concern Present (10/10/2022)   [employer] of Occupational Health - Occupational Stress Questionnaire    Feeling of Stress : Not at all  Social Connections: Socially Integrated (10/10/2022)   Social Connection and Isolation Panel [NHANES]    Frequency of Communication with Friends and Family: More than three times a week    Frequency of Social Gatherings with Friends and Family: More than three times a week    Attends Religious Services: More than 4 times per year    Active Member of [employer] or Organizations: Yes    Attends Engineer, structural: More than 4 times per year    Marital Status: Married    Tobacco Counseling Counseling given: Not Answered   Clinical Intake:  Pre-visit preparation completed: Yes  Pain : No/denies pain     BMI - recorded: 31.38 Nutritional Risks: None Diabetes: No  How often do you need to have someone help you when you read instructions, pamphlets, or other written materials from your doctor or pharmacy?: 1 - Never  Interpreter Needed?: No  Information entered by :: R. Roy Tokarz LPN   Activities of Daily Living    10/09/2022    7:45 PM 04/01/2022    8:55 AM  In your present state of health, do you have any difficulty performing the following activities:  Hearing? 0   Vision? 0   Difficulty concentrating or making decisions? 0   Walking or climbing stairs? 0   Dressing or bathing? 0   Doing errands, shopping? 0 0  Preparing Food and eating ? N   Using the Toilet? N   In the past six months, have you accidently leaked urine? N   Do you have problems with loss of bowel control? N   Managing your Medications? N   Managing your Finances? N   Housekeeping or managing your Housekeeping? N     Patient Care Team: Glori Luis, MD as PCP - General (Family Medicine)  Indicate any recent  Medical Services you may have received from other than [facility] providers in the past year (  date may be approximate).     Assessment:   This is a routine wellness examination for Sissy.  Hearing/Vision screen Hearing Screening - Comments:: No issues Vision Screening - Comments:: No glasses  Dietary issues and exercise activities discussed:     Goals Addressed             This Visit's Progress    Patient Stated       Wants to exercise more and keep weight down      Depression Screen    10/10/2022    8:29 AM 09/12/2022   11:06 AM 11/04/2021    9:23 AM 09/20/2021    8:32 AM 09/19/2020    9:43 AM 09/09/2019    9:09 AM 04/20/2019    3:07 PM  PHQ 2/9 Scores  PHQ - 2 Score 0 0 0 0 0 0 0  PHQ- 9 Score 0 0         Fall Risk    10/09/2022    7:45 PM 09/12/2022   11:06 AM 11/04/2021    9:22 AM 09/20/2021    8:33 AM 09/19/2020    9:44 AM  Fall Risk   Falls in the past year? 0 0 0 0 0  Number falls in past yr: 0 0 0 0 0  Injury with Fall? 0 0 0  0  Risk for fall due to : No Fall Risks No Fall Risks No Fall Risks    Follow up Falls prevention discussed;Falls evaluation completed;Education provided Falls evaluation completed Falls evaluation completed Falls evaluation completed Falls evaluation completed    MEDICARE RISK AT HOME:      Cognitive Function:    08/31/2017    8:38 AM 08/28/2016    8:58 AM 08/29/2015    8:41 AM  MMSE - Mini Mental State Exam  Orientation to time 5 5 5   Orientation to Place 5 5 5   Registration 3 3 3   Attention/ Calculation 5 5 5   Recall 3 2 3   Language- name 2 objects 2 2 2   Language- repeat 1 1 1   Language- follow 3 step command 3 3 3   Language- read & follow direction 1 1 1   Write a sentence 1 1 1   Copy design 1 1 1   Total score 30 29 30         10/10/2022    8:40 AM 09/03/2018   11:15 AM  6CIT Screen  What Year? 0 points 0 points  What month? 0 points 0 points  What time? 0 points 0 points  Count back from 20 0 points 0 points  Months in  reverse 0 points 0 points  Repeat phrase 0 points 0 points  Total Score 0 points 0 points    Immunizations Immunization History  Administered Date(s) Administered   Fluad Quad(high Dose 65+) 01/03/2019, 01/27/2020, 03/18/2021, 02/10/2022   Influenza, High Dose Seasonal PF 01/16/2017, 02/19/2018   Influenza,inj,Quad PF,6+ Mos 02/24/2013, 01/23/2014, 02/28/2015, 01/28/2016   PFIZER Comirnaty(Gray Top)Covid-19 Tri-Sucrose Vaccine 10/17/2020   PFIZER(Purple Top)SARS-COV-2 Vaccination 05/24/2019, 06/14/2019, 02/02/2020   Pneumococcal Conjugate-13 07/20/2013   Pneumococcal Polysaccharide-23 07/24/2014   Td 07/16/2010    TDAP status: Due, Education has been provided regarding the importance of this vaccine. Advised may receive this vaccine at local pharmacy or Health Dept. Aware to provide a copy of the vaccination record if obtained from local pharmacy or Health Dept. Verbalized acceptance and understanding.  Flu Vaccine status: Completed at today's visit  Pneumococcal vaccine status: Up to date  Covid-19  vaccine status: Information provided on how to obtain vaccines.   Qualifies for Shingles Vaccine? Yes   Zostavax completed No   Shingrix Completed?: No.    Education has been provided regarding the importance of this vaccine. Patient has been advised to call insurance company to determine out of pocket expense if they have not yet received this vaccine. Advised may also receive vaccine at local pharmacy or Health Dept. Verbalized acceptance and understanding.  Screening Tests Health Maintenance  Topic Date Due   DTaP/Tdap/Td (2 - Tdap) 07/15/2020   COVID-19 Vaccine (5 - 2023-24 season) 12/13/2021   INFLUENZA VACCINE  11/13/2022   MAMMOGRAM  12/21/2022   DEXA SCAN  05/10/2023   Medicare Annual Wellness (AWV)  10/10/2023   Colonoscopy  11/24/2024   Pneumonia Vaccine 18+ Years old  Completed   Hepatitis C Screening  Completed   HPV VACCINES  Aged Out   Zoster Vaccines- Shingrix   Discontinued    Health Maintenance  Health Maintenance Due  Topic Date Due   DTaP/Tdap/Td (2 - Tdap) 07/15/2020   COVID-19 Vaccine (5 - 2023-24 season) 12/13/2021    Colorectal cancer screening: Type of screening: Colonoscopy. Completed 11/25/19. Repeat every 5 years  Mammogram status: Completed 12/20/21. Repeat every year  Bone Density status: Completed 05/08/21. Results reflect: Bone density results: OSTEOPENIA. Repeat every 2 years. Scheduled 11/03/22    Additional Screening:  Hepatitis C Screening: does qualify; Completed 10/24/15  Vision Screening: Recommended annual ophthalmology exams for early detection of glaucoma and other disorders of the eye. Is the patient up to date with their annual eye exam?  Yes  Who is the provider or what is the name of the office in which the patient attends annual eye exams? [facility] If pt is not established with a provider, would they like to be referred to a provider to establish care? No .   Dental Screening: Recommended annual dental exams for proper oral hygiene   Community Resource Referral / Chronic Care Management: CRR required this visit?  No   CCM required this visit?  No     Plan:     I have personally reviewed and noted the following in the patient's chart:   Medical and social history Use of alcohol, tobacco or illicit drugs  Current medications and supplements including opioid prescriptions. Patient is not currently taking opioid prescriptions. Functional ability and status Nutritional status Physical activity Advanced directives List of other physicians Hospitalizations, surgeries, and ER visits in previous 12 months Vitals Screenings to include cognitive, depression, and falls Referrals and appointments  In addition, I have reviewed and discussed with patient certain preventive protocols, quality metrics, and best practice recommendations. A written personalized care plan for preventive services as well as  general preventive health recommendations were provided to patient.     Sydell Axon, LPN   [id number]/61/0960   After Visit Summary: (MyChart) Due to this being a telephonic visit, the after visit summary with patients personalized plan was offered to patient via MyChart   Nurse Notes: None

## 2022-10-10 NOTE — Patient Instructions (Signed)
Katherine Stanley , Thank you for taking time to come for your Medicare Wellness Visit. I appreciate your ongoing commitment to your health goals. Please review the following plan we discussed and let me know if I can assist you in the future.   These are the goals we discussed:  Goals      Patient Stated     Wants to exercise more and keep weight down     Weight goal 135lb     Stay hydrated  Low carb diet; portion control  Stay active        This is a list of the screening recommended for you and due dates:  Health Maintenance  Topic Date Due   DTaP/Tdap/Td vaccine (2 - Tdap) 07/15/2020   COVID-19 Vaccine (5 - 2023-24 season) 12/13/2021   Flu Shot  11/13/2022   Mammogram  12/21/2022   DEXA scan (bone density measurement)  05/10/2023   Medicare Annual Wellness Visit  10/10/2023   Colon Cancer Screening  11/24/2024   Pneumonia Vaccine  Completed   Hepatitis C Screening  Completed   HPV Vaccine  Aged Out   Zoster (Shingles) Vaccine  Discontinued    Advanced directives: will bring to the office  Conditions/risks identified: none  Next appointment: Follow up in one year for your annual wellness visit 10/09/22   Preventive Care 65 Years and Older, Female Preventive care refers to lifestyle choices and visits with your health care provider that can promote health and wellness. What does preventive care include? A yearly physical exam. This is also called an annual well check. Dental exams once or twice a year. Routine eye exams. Ask your health care provider how often you should have your eyes checked. Personal lifestyle choices, including: Daily care of your teeth and gums. Regular physical activity. Eating a healthy diet. Avoiding tobacco and drug use. Limiting alcohol use. Practicing safe sex. Taking low-dose aspirin every day. Taking vitamin and mineral supplements as recommended by your health care provider. What happens during an annual well check? The services and  screenings done by your health care provider during your annual well check will depend on your age, overall health, lifestyle risk factors, and family history of disease. Counseling  Your health care provider may ask you questions about your: Alcohol use. Tobacco use. Drug use. Emotional well-being. Home and relationship well-being. Sexual activity. Eating habits. History of falls. Memory and ability to understand (cognition). Work and work Astronomer. Reproductive health. Screening  You may have the following tests or measurements: Height, weight, and BMI. Blood pressure. Lipid and cholesterol levels. These may be checked every 5 years, or more frequently if you are over 72 years old. Skin check. Lung cancer screening. You may have this screening every year starting at age 73 if you have a 30-pack-year history of smoking and currently smoke or have quit within the past 15 years. Fecal occult blood test (FOBT) of the stool. You may have this test every year starting at age 68. Flexible sigmoidoscopy or colonoscopy. You may have a sigmoidoscopy every 5 years or a colonoscopy every 10 years starting at age 90. Hepatitis C blood test. Hepatitis B blood test. Sexually transmitted disease (STD) testing. Diabetes screening. This is done by checking your blood sugar (glucose) after you have not eaten for a while (fasting). You may have this done every 1-3 years. Bone density scan. This is done to screen for osteoporosis. You may have this done starting at age 14. Mammogram. This may be  done every 1-2 years. Talk to your health care provider about how often you should have regular mammograms. Talk with your health care provider about your test results, treatment options, and if necessary, the need for more tests. Vaccines  Your health care provider may recommend certain vaccines, such as: Influenza vaccine. This is recommended every year. Tetanus, diphtheria, and acellular pertussis (Tdap,  Td) vaccine. You may need a Td booster every 10 years. Zoster vaccine. You may need this after age 33. Pneumococcal 13-valent conjugate (PCV13) vaccine. One dose is recommended after age 50. Pneumococcal polysaccharide (PPSV23) vaccine. One dose is recommended after age 56. Talk to your health care provider about which screenings and vaccines you need and how often you need them. This information is not intended to replace advice given to you by your health care provider. Make sure you discuss any questions you have with your health care provider. Document Released: 04/27/2015 Document Revised: 12/19/2015 Document Reviewed: 01/30/2015 Elsevier Interactive Patient Education  2017 [place] Prevention in the Home Falls can cause injuries. They can happen to people of all ages. There are many things you can do to make your home safe and to help prevent falls. What can I do on the outside of my home? Regularly fix the edges of walkways and driveways and fix any cracks. Remove anything that might make you trip as you walk through a door, such as a raised step or threshold. Trim any bushes or trees on the path to your home. Use bright outdoor lighting. Clear any walking paths of anything that might make someone trip, such as rocks or tools. Regularly check to see if handrails are loose or broken. Make sure that both sides of any steps have handrails. Any raised decks and porches should have guardrails on the edges. Have any leaves, snow, or ice cleared regularly. Use sand or salt on walking paths during winter. Clean up any spills in your garage right away. This includes oil or grease spills. What can I do in the bathroom? Use night lights. Install grab bars by the toilet and in the tub and shower. Do not use towel bars as grab bars. Use non-skid mats or decals in the tub or shower. If you need to sit down in the shower, use a plastic, non-slip stool. Keep the floor dry. Clean up any  water that spills on the floor as soon as it happens. Remove soap buildup in the tub or shower regularly. Attach bath mats securely with double-sided non-slip rug tape. Do not have throw rugs and other things on the floor that can make you trip. What can I do in the bedroom? Use night lights. Make sure that you have a light by your bed that is easy to reach. Do not use any sheets or blankets that are too big for your bed. They should not hang down onto the floor. Have a firm chair that has side arms. You can use this for support while you get dressed. Do not have throw rugs and other things on the floor that can make you trip. What can I do in the kitchen? Clean up any spills right away. Avoid walking on wet floors. Keep items that you use a lot in easy-to-reach places. If you need to reach something above you, use a strong step stool that has a grab bar. Keep electrical cords out of the way. Do not use floor polish or wax that makes floors slippery. If you must use wax,  use non-skid floor wax. Do not have throw rugs and other things on the floor that can make you trip. What can I do with my stairs? Do not leave any items on the stairs. Make sure that there are handrails on both sides of the stairs and use them. Fix handrails that are broken or loose. Make sure that handrails are as long as the stairways. Check any carpeting to make sure that it is firmly attached to the stairs. Fix any carpet that is loose or worn. Avoid having throw rugs at the top or bottom of the stairs. If you do have throw rugs, attach them to the floor with carpet tape. Make sure that you have a light switch at the top of the stairs and the bottom of the stairs. If you do not have them, ask someone to add them for you. What else can I do to help prevent falls? Wear shoes that: Do not have high heels. Have rubber bottoms. Are comfortable and fit you well. Are closed at the toe. Do not wear sandals. If you use a  stepladder: Make sure that it is fully opened. Do not climb a closed stepladder. Make sure that both sides of the stepladder are locked into place. Ask someone to hold it for you, if possible. Clearly mark and make sure that you can see: Any grab bars or handrails. First and last steps. Where the edge of each step is. Use tools that help you move around (mobility aids) if they are needed. These include: Canes. Walkers. Scooters. Crutches. Turn on the lights when you go into a dark area. Replace any light bulbs as soon as they burn out. Set up your furniture so you have a clear path. Avoid moving your furniture around. If any of your floors are uneven, fix them. If there are any pets around you, be aware of where they are. Review your medicines with your doctor. Some medicines can make you feel dizzy. This can increase your chance of falling. Ask your doctor what other things that you can do to help prevent falls. This information is not intended to replace advice given to you by your health care provider. Make sure you discuss any questions you have with your health care provider. Document Released: 01/25/2009 Document Revised: 09/06/2015 Document Reviewed: 05/05/2014 Elsevier Interactive Patient Education  2017 [employer].

## 2022-11-03 ENCOUNTER — Ambulatory Visit
Admission: RE | Admit: 2022-11-03 | Discharge: 2022-11-03 | Disposition: A | Discharge status: Home / Self Care | Payer: Medicare Other | Source: Ambulatory Visit | Attending: Family Medicine | Admitting: Family Medicine

## 2022-11-03 ENCOUNTER — Telehealth: Payer: Self-pay

## 2022-11-03 DIAGNOSIS — Z780 Asymptomatic menopausal state: Secondary | ICD-10-CM | POA: Insufficient documentation

## 2022-11-03 DIAGNOSIS — Z8781 Personal history of (healed) traumatic fracture: Secondary | ICD-10-CM | POA: Insufficient documentation

## 2022-11-03 DIAGNOSIS — M8580 Other specified disorders of bone density and structure, unspecified site: Secondary | ICD-10-CM | POA: Diagnosis not present

## 2022-11-03 DIAGNOSIS — M810 Age-related osteoporosis without current pathological fracture: Secondary | ICD-10-CM | POA: Diagnosis not present

## 2022-11-03 NOTE — Telephone Encounter (Signed)
Left message to call the office back regarding bone density results below.

## 2022-11-03 NOTE — Telephone Encounter (Signed)
-----   Message from Marikay Alar sent at 11/03/2022  4:14 PM EDT ----- Please of the patient know that her bone density scan revealed osteoporosis.  I would suggest treatment for this.  If she is willing to have treatment for this issue to reduce her risk of her bones weakening further please find out if she has had stomach ulcers, esophageal ulcers, or if she has trouble swallowing.  Thanks.

## 2022-11-04 NOTE — Telephone Encounter (Signed)
Noted.  It looks like she has had issues with Fosamax in the past.  Can we please get her set up for a visit in the office to discuss potential treatment options?

## 2022-11-04 NOTE — Telephone Encounter (Signed)
Patient is scheduled for 11/24/22.

## 2022-11-04 NOTE — Telephone Encounter (Signed)
Pt called back and I read the message to her and she stated she has not had any ulcers or trouble swallowing

## 2022-11-05 ENCOUNTER — Telehealth: Payer: Self-pay

## 2022-11-05 NOTE — Telephone Encounter (Signed)
Left message to call the office back regarding the bone density scan below.

## 2022-11-05 NOTE — Telephone Encounter (Signed)
Patient is scheduled for 11/24/22.

## 2022-11-05 NOTE — Telephone Encounter (Signed)
Pt returned Elizabeth CMA call. Transferred. 

## 2022-11-05 NOTE — Telephone Encounter (Signed)
-----   Message from Marikay Alar sent at 11/03/2022  4:14 PM EDT ----- Please of the patient know that her bone density scan revealed osteoporosis.  I would suggest treatment for this.  If she is willing to have treatment for this issue to reduce her risk of her bones weakening further please find out if she has had stomach ulcers, esophageal ulcers, or if she has trouble swallowing.  Thanks.

## 2022-11-24 ENCOUNTER — Encounter: Payer: Self-pay | Admitting: Family Medicine

## 2022-11-24 ENCOUNTER — Ambulatory Visit: Payer: Medicare Other | Admitting: Family Medicine

## 2022-11-24 VITALS — BP 142/84 | HR 100 | Temp 97.8°F | Ht 60.25 in | Wt 170.0 lb

## 2022-11-24 DIAGNOSIS — I10 Essential (primary) hypertension: Secondary | ICD-10-CM | POA: Diagnosis not present

## 2022-11-24 DIAGNOSIS — M810 Age-related osteoporosis without current pathological fracture: Secondary | ICD-10-CM

## 2022-11-24 NOTE — Assessment & Plan Note (Signed)
Chronic issue.  Adequately controlled at home.  Patient will continue lisinopril 5 mg daily.

## 2022-11-24 NOTE — Progress Notes (Signed)
Marikay Alar, MD Phone: [phone]  Katherine Stanley is a 77 y.o. female who presents today for follow-up.  Osteoporosis: Patient is taking vitamin D 1000 international units daily.  She gets calcium in her multivitamin.  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  Patient notes she did have significant aching with Fosamax in the past.  Hypertension: Patient notes blood pressures typically less than 130/80.  Taking lisinopril.  No chest pain, shortness of breath, or edema.  Social History   Tobacco Use  Smoking Status Never  Smokeless Tobacco Never    Current Outpatient Medications on File Prior to Visit  Medication Sig Dispense Refill   acetaminophen (TYLENOL) 650 MG CR tablet Take 1,300 mg by mouth every 8 (eight) hours as needed for pain.     cholecalciferol (VITAMIN D) 1000 units tablet Take 1,000 Units by mouth daily.     Coenzyme Q10 (COQ-10) 100 MG CAPS Take 100 mg by mouth daily.     diclofenac Sodium (VOLTAREN) 1 % GEL Apply 1 application topically 4 (four) times daily as needed (pain).     ferrous gluconate (FERGON) 240 (27 FE) MG tablet Take 240 mg by mouth daily.     Krill Oil 350 MG CAPS Take 350 mg by mouth daily.     lisinopril (ZESTRIL) 10 MG tablet Take 1/2 (one-half) tablet by mouth once daily Strength: 10 mg 45 tablet 2   Lysine 500 MG CAPS Take 500 mg by mouth daily as needed (fever blisters).     Multiple Vitamin (MULTIVITAMIN WITH MINERALS) TABS tablet Take 1 tablet by mouth daily.     Multiple Vitamins-Minerals (PRESERVISION AREDS 2) CAPS Take 1 capsule by mouth daily.     mupirocin ointment (BACTROBAN) 2 % Apply to affected areas and inside nose twice daily 22 g 1   OVER THE COUNTER MEDICATION Take 1 tablet by mouth daily. Beet root supplement     Plant Sterols and Stanols (CHOLEST OFF PO) Take 1 tablet by mouth daily.     Potassium 99 MG TABS Take 99 mg by mouth daily.     vitamin B-12  (CYANOCOBALAMIN) 1000 MCG tablet Take 1,000 mcg by mouth daily.     White Petrolatum-Mineral Oil (RETAINE PM) OINT Place 1 Application into both eyes See admin instructions. Apply to both eyes every morning, may use again as needed for dryness     aspirin 81 MG chewable tablet Chew 1 tablet (81 mg total) by mouth 2 (two) times daily. 30 tablet 0   Calcium 250 MG CAPS Take 250 mg by mouth daily. (Patient not taking: Reported on 10/10/2022)     No current facility-administered medications on file prior to visit.     ROS see history of present illness  Objective  Physical Exam Vitals:   11/24/22 1354 11/24/22 1414  BP: 130/80 (!) 142/84  Pulse: 100   Temp: 97.8 F (36.6 C)   SpO2: 99%     BP Readings from Last 3 Encounters:  11/24/22 (!) 142/84  10/10/22 122/63  09/12/22 124/78   Wt Readings from Last 3 Encounters:  11/24/22 170 lb (77.1 kg)  10/10/22 162 lb (73.5 kg)  09/12/22 169 lb (76.7 kg)    Physical Exam   Assessment/Plan: Please see individual problem list.  Primary hypertension Assessment & Plan: Chronic issue.  Adequately controlled at home.  Patient will continue lisinopril 5 mg daily.   Age-related osteoporosis without current pathological fracture  Assessment & Plan: Chronic issue.  DEXA scan with osteoporosis recently.  We will check vitamin D today to ensure that this is acceptable.  Discussed treatment options including bisphosphonate, zoledronic acid, and in office injectable therapies.  Discussed the risk of aching as well as osteonecrosis of the jaw.  We opted to try an alternative bisphosphonate to Fosamax as long as the patient's vitamin D is acceptable.  Orders: -     VITAMIN D 25 Hydroxy (Vit-D Deficiency, Fractures)    Return for As scheduled.   Marikay Alar, MD [facility]

## 2022-11-24 NOTE — Assessment & Plan Note (Signed)
Chronic issue.  DEXA scan with osteoporosis recently.  We will check vitamin D today to ensure that this is acceptable.  Discussed treatment options including bisphosphonate, zoledronic acid, and in office injectable therapies.  Discussed the risk of aching as well as osteonecrosis of the jaw.  We opted to try an alternative bisphosphonate to Fosamax as long as the patient's vitamin D is acceptable.

## 2022-11-25 ENCOUNTER — Other Ambulatory Visit: Payer: Self-pay | Admitting: Family Medicine

## 2022-11-25 MED ORDER — RISEDRONATE SODIUM 35 MG PO TABS: 35.0000 mg | ORAL_TABLET | ORAL | 3 refills | Status: DC

## 2022-11-27 NOTE — Telephone Encounter (Signed)
Patient may need a PA for this. She has not tolerated alendronate in the past. Please send to the PA team.

## 2022-12-01 ENCOUNTER — Ambulatory Visit: Payer: Medicare Other | Admitting: Orthopaedic Surgery

## 2022-12-05 ENCOUNTER — Telehealth: Payer: Self-pay

## 2022-12-05 ENCOUNTER — Other Ambulatory Visit (HOSPITAL_COMMUNITY): Payer: Self-pay

## 2022-12-05 NOTE — Telephone Encounter (Signed)
Pharmacy Patient Advocate Encounter   Received notification from RX Request Messages that prior authorization for Risedronate Sodium 35MG  tablets is required/requested.   Insurance verification completed.   The patient is insured through [employer] .   Per test claim: PA required; PA submitted to [facility] via CoverMyMeds Key/confirmation #/EOC [facility] Status is pending

## 2022-12-05 NOTE — Telephone Encounter (Signed)
Pharmacy Patient Advocate Encounter  Received notification from [facility] that Prior Authorization for Risedronate Sodium 35MG  tablets has been DENIED. Please advise how you'd like to proceed. Full denial letter will be uploaded to the media tab. See denial reason below.   PA #/Case ID/Reference #:  [id number]

## 2022-12-09 NOTE — Telephone Encounter (Signed)
Patient returned office phone call and note from Dr Birdie Sons was read. Patient stated she used [facility] to get risedronate, she states she is good.

## 2022-12-09 NOTE — Telephone Encounter (Signed)
Please let the patient know that her risedronate for her osteoporosis is not covered by her insurance.  If she willing to do an injection for her osteoporosis?

## 2022-12-09 NOTE — Telephone Encounter (Signed)
Left message to call the office back regarding the message below and if she is willing to do an injection for her osteoporosis?

## 2022-12-10 NOTE — Telephone Encounter (Signed)
Noted  

## 2022-12-22 ENCOUNTER — Other Ambulatory Visit: Payer: Self-pay | Admitting: Family Medicine

## 2022-12-22 DIAGNOSIS — Z1231 Encounter for screening mammogram for malignant neoplasm of breast: Secondary | ICD-10-CM

## 2023-01-02 ENCOUNTER — Ambulatory Visit
Admission: RE | Admit: 2023-01-02 | Discharge: 2023-01-02 | Disposition: A | Discharge status: Home / Self Care | Payer: Medicare Other | Source: Ambulatory Visit | Attending: Family Medicine | Admitting: Family Medicine

## 2023-01-02 DIAGNOSIS — Z1231 Encounter for screening mammogram for malignant neoplasm of breast: Secondary | ICD-10-CM | POA: Diagnosis present

## 2023-01-13 ENCOUNTER — Emergency Department: Payer: Medicare Other

## 2023-01-13 ENCOUNTER — Other Ambulatory Visit: Payer: Self-pay

## 2023-01-13 ENCOUNTER — Encounter: Payer: Self-pay | Admitting: Emergency Medicine

## 2023-01-13 ENCOUNTER — Inpatient Hospital Stay
Admission: EM | Admit: 2023-01-13 | Discharge: 2023-01-20 | DRG: 871 | Disposition: A | Discharge status: Home / Self Care | Payer: Medicare Other | Attending: General Surgery | Admitting: General Surgery

## 2023-01-13 DIAGNOSIS — Z9842 Cataract extraction status, left eye: Secondary | ICD-10-CM | POA: Diagnosis not present

## 2023-01-13 DIAGNOSIS — R599 Enlarged lymph nodes, unspecified: Secondary | ICD-10-CM | POA: Diagnosis not present

## 2023-01-13 DIAGNOSIS — Z888 Allergy status to other drugs, medicaments and biological substances status: Secondary | ICD-10-CM

## 2023-01-13 DIAGNOSIS — K449 Diaphragmatic hernia without obstruction or gangrene: Secondary | ICD-10-CM | POA: Diagnosis not present

## 2023-01-13 DIAGNOSIS — Z9049 Acquired absence of other specified parts of digestive tract: Secondary | ICD-10-CM

## 2023-01-13 DIAGNOSIS — E876 Hypokalemia: Secondary | ICD-10-CM | POA: Diagnosis present

## 2023-01-13 DIAGNOSIS — R6510 Systemic inflammatory response syndrome (SIRS) of non-infectious origin without acute organ dysfunction: Secondary | ICD-10-CM | POA: Diagnosis present

## 2023-01-13 DIAGNOSIS — K3532 Acute appendicitis with perforation and localized peritonitis, without abscess: Principal | ICD-10-CM

## 2023-01-13 DIAGNOSIS — Z9841 Cataract extraction status, right eye: Secondary | ICD-10-CM

## 2023-01-13 DIAGNOSIS — Z90710 Acquired absence of both cervix and uterus: Secondary | ICD-10-CM | POA: Diagnosis not present

## 2023-01-13 DIAGNOSIS — K7689 Other specified diseases of liver: Secondary | ICD-10-CM | POA: Diagnosis not present

## 2023-01-13 DIAGNOSIS — Z8249 Family history of ischemic heart disease and other diseases of the circulatory system: Secondary | ICD-10-CM

## 2023-01-13 DIAGNOSIS — Z96651 Presence of right artificial knee joint: Secondary | ICD-10-CM | POA: Diagnosis not present

## 2023-01-13 DIAGNOSIS — K353 Acute appendicitis with localized peritonitis, without perforation or gangrene: Secondary | ICD-10-CM | POA: Diagnosis present

## 2023-01-13 DIAGNOSIS — M8580 Other specified disorders of bone density and structure, unspecified site: Secondary | ICD-10-CM | POA: Diagnosis present

## 2023-01-13 DIAGNOSIS — K35211 Acute appendicitis with generalized peritonitis, with perforation and abscess: Secondary | ICD-10-CM | POA: Diagnosis not present

## 2023-01-13 DIAGNOSIS — A419 Sepsis, unspecified organism: Principal | ICD-10-CM | POA: Diagnosis present

## 2023-01-13 DIAGNOSIS — Z79899 Other long term (current) drug therapy: Secondary | ICD-10-CM | POA: Diagnosis not present

## 2023-01-13 DIAGNOSIS — Z886 Allergy status to analgesic agent status: Secondary | ICD-10-CM | POA: Diagnosis not present

## 2023-01-13 DIAGNOSIS — K5720 Diverticulitis of large intestine with perforation and abscess without bleeding: Secondary | ICD-10-CM | POA: Diagnosis not present

## 2023-01-13 DIAGNOSIS — K8689 Other specified diseases of pancreas: Secondary | ICD-10-CM | POA: Diagnosis not present

## 2023-01-13 DIAGNOSIS — K5750 Diverticulosis of both small and large intestine without perforation or abscess without bleeding: Secondary | ICD-10-CM | POA: Diagnosis not present

## 2023-01-13 DIAGNOSIS — K3580 Unspecified acute appendicitis: Secondary | ICD-10-CM | POA: Diagnosis not present

## 2023-01-13 DIAGNOSIS — Z96643 Presence of artificial hip joint, bilateral: Secondary | ICD-10-CM | POA: Diagnosis present

## 2023-01-13 DIAGNOSIS — R1084 Generalized abdominal pain: Secondary | ICD-10-CM

## 2023-01-13 DIAGNOSIS — Z961 Presence of intraocular lens: Secondary | ICD-10-CM | POA: Diagnosis present

## 2023-01-13 DIAGNOSIS — K838 Other specified diseases of biliary tract: Secondary | ICD-10-CM | POA: Diagnosis not present

## 2023-01-13 DIAGNOSIS — R935 Abnormal findings on diagnostic imaging of other abdominal regions, including retroperitoneum: Secondary | ICD-10-CM | POA: Diagnosis not present

## 2023-01-13 DIAGNOSIS — D7389 Other diseases of spleen: Secondary | ICD-10-CM | POA: Diagnosis not present

## 2023-01-13 DIAGNOSIS — K3533 Acute appendicitis with perforation and localized peritonitis, with abscess: Secondary | ICD-10-CM | POA: Diagnosis present

## 2023-01-13 DIAGNOSIS — R000 Tachycardia, unspecified: Secondary | ICD-10-CM | POA: Diagnosis not present

## 2023-01-13 DIAGNOSIS — N281 Cyst of kidney, acquired: Secondary | ICD-10-CM | POA: Diagnosis not present

## 2023-01-13 DIAGNOSIS — K5730 Diverticulosis of large intestine without perforation or abscess without bleeding: Secondary | ICD-10-CM | POA: Diagnosis not present

## 2023-01-13 DIAGNOSIS — Z7982 Long term (current) use of aspirin: Secondary | ICD-10-CM

## 2023-01-13 DIAGNOSIS — K56609 Unspecified intestinal obstruction, unspecified as to partial versus complete obstruction: Secondary | ICD-10-CM | POA: Diagnosis not present

## 2023-01-13 DIAGNOSIS — I10 Essential (primary) hypertension: Secondary | ICD-10-CM | POA: Diagnosis not present

## 2023-01-13 HISTORY — DX: Acute appendicitis with localized peritonitis, without perforation or gangrene: K35.30

## 2023-01-13 LAB — CBC WITH DIFFERENTIAL/PLATELET
Abs Immature Granulocytes: 0.2 10*3/uL — ABNORMAL HIGH (ref 0.00–0.07)
Basophils Absolute: 0.1 10*3/uL (ref 0.0–0.1)
Basophils Relative: 1 %
Eosinophils Absolute: 0.1 10*3/uL (ref 0.0–0.5)
Eosinophils Relative: 0 %
HCT: 38 % (ref 36.0–46.0)
Hemoglobin: 13.3 g/dL (ref 12.0–15.0)
Immature Granulocytes: 1 %
Lymphocytes Relative: 11 %
Lymphs Abs: 1.9 10*3/uL (ref 0.7–4.0)
MCH: 30.9 pg (ref 26.0–34.0)
MCHC: 35 g/dL (ref 30.0–36.0)
MCV: 88.2 fL (ref 80.0–100.0)
Monocytes Absolute: 1.5 10*3/uL — ABNORMAL HIGH (ref 0.1–1.0)
Monocytes Relative: 8 %
Neutro Abs: 13.7 10*3/uL — ABNORMAL HIGH (ref 1.7–7.7)
Neutrophils Relative %: 79 %
Platelets: 272 10*3/uL (ref 150–400)
RBC: 4.31 MIL/uL (ref 3.87–5.11)
RDW: 11.9 % (ref 11.5–15.5)
WBC: 17.5 10*3/uL — ABNORMAL HIGH (ref 4.0–10.5)
nRBC: 0 % (ref 0.0–0.2)

## 2023-01-13 LAB — LACTIC ACID, PLASMA
Lactic Acid, Venous: 1 mmol/L (ref 0.5–1.9)
Lactic Acid, Venous: 0.9 mmol/L (ref 0.5–1.9)

## 2023-01-13 LAB — COMPREHENSIVE METABOLIC PANEL
ALT: 31 U/L (ref 0–44)
AST: 23 U/L (ref 15–41)
Albumin: 3.1 g/dL — ABNORMAL LOW (ref 3.5–5.0)
Alkaline Phosphatase: 67 U/L (ref 38–126)
Anion gap: 12 (ref 5–15)
BUN: 14 mg/dL (ref 8–23)
CO2: 21 mmol/L — ABNORMAL LOW (ref 22–32)
Calcium: 8.6 mg/dL — ABNORMAL LOW (ref 8.9–10.3)
Chloride: 105 mmol/L (ref 98–111)
Creatinine, Ser: 0.54 mg/dL (ref 0.44–1.00)
GFR, Estimated: >60 mL/min (ref >60)
Glucose, Bld: 155 mg/dL — ABNORMAL HIGH (ref 70–99)
Potassium: 3 mmol/L — ABNORMAL LOW (ref 3.5–5.1)
Sodium: 138 mmol/L (ref 135–145)
Total Bilirubin: 1.1 mg/dL (ref 0.3–1.2)
Total Protein: 7 g/dL (ref 6.5–8.1)

## 2023-01-13 LAB — MAGNESIUM: Magnesium: 1.8 mg/dL (ref 1.7–2.4)

## 2023-01-13 LAB — URINALYSIS, ROUTINE W REFLEX MICROSCOPIC
Bilirubin Urine: NEGATIVE
Glucose, UA: NEGATIVE mg/dL
Hgb urine dipstick: NEGATIVE
Ketones, ur: NEGATIVE mg/dL
Leukocytes,Ua: NEGATIVE
Nitrite: NEGATIVE
Protein, ur: NEGATIVE mg/dL
Specific Gravity, Urine: 1.02 (ref 1.005–1.030)
pH: 6 (ref 5.0–8.0)

## 2023-01-13 LAB — LIPASE, BLOOD: Lipase: 24 U/L (ref 11–51)

## 2023-01-13 LAB — PROCALCITONIN: Procalcitonin: <0.1 ng/mL

## 2023-01-13 MED ORDER — PANTOPRAZOLE SODIUM 40 MG IV SOLR
40.0000 mg | Freq: Every day | INTRAVENOUS | Status: DC
  Administered 2023-01-13 – 2023-01-14 (×2): 40 mg via INTRAVENOUS
  Filled 2023-01-13 (×2): qty 10

## 2023-01-13 MED ORDER — IOHEXOL 300 MG/ML  SOLN
100.0000 mL | Freq: Once | INTRAMUSCULAR | Status: AC | PRN
  Administered 2023-01-13: 100 mL via INTRAVENOUS

## 2023-01-13 MED ORDER — ENOXAPARIN SODIUM 40 MG/0.4ML IJ SOSY
40.0000 mg | PREFILLED_SYRINGE | INTRAMUSCULAR | Status: DC
  Administered 2023-01-13 – 2023-01-19 (×7): 40 mg via SUBCUTANEOUS
  Filled 2023-01-13 (×8): qty 0.4

## 2023-01-13 MED ORDER — SODIUM CHLORIDE 0.9 % IV BOLUS (SEPSIS)
1000.0000 mL | Freq: Once | INTRAVENOUS | Status: AC
  Administered 2023-01-13: 1000 mL via INTRAVENOUS

## 2023-01-13 MED ORDER — PIPERACILLIN-TAZOBACTAM 3.375 G IVPB
3.3750 g | Freq: Three times a day (TID) | INTRAVENOUS | Status: DC
  Administered 2023-01-13 – 2023-01-20 (×20): 3.375 g via INTRAVENOUS
  Filled 2023-01-13 (×20): qty 50

## 2023-01-13 MED ORDER — SODIUM CHLORIDE 0.9 % IV SOLN: INTRAVENOUS | Status: DC

## 2023-01-13 MED ORDER — ONDANSETRON HCL 4 MG/2ML IJ SOLN
4.0000 mg | Freq: Once | INTRAMUSCULAR | Status: AC
  Administered 2023-01-13: 4 mg via INTRAVENOUS
  Filled 2023-01-13: qty 2

## 2023-01-13 MED ORDER — HYDROCODONE-ACETAMINOPHEN 5-325 MG PO TABS
1.0000 | ORAL_TABLET | ORAL | Status: DC | PRN
  Administered 2023-01-13 (×3): 1 via ORAL
  Administered 2023-01-14: 2 via ORAL
  Administered 2023-01-14 (×2): 1 via ORAL
  Administered 2023-01-15 – 2023-01-17 (×6): 2 via ORAL
  Filled 2023-01-13: qty 1
  Filled 2023-01-13: qty 2
  Filled 2023-01-13 (×3): qty 1
  Filled 2023-01-13 (×4): qty 2
  Filled 2023-01-13: qty 1
  Filled 2023-01-13 (×2): qty 2

## 2023-01-13 MED ORDER — ONDANSETRON 4 MG PO TBDP: 4.0000 mg | ORAL_TABLET | Freq: Four times a day (QID) | ORAL | Status: DC | PRN

## 2023-01-13 MED ORDER — LISINOPRIL 10 MG PO TABS
5.0000 mg | ORAL_TABLET | Freq: Every day | ORAL | Status: DC
  Administered 2023-01-13 – 2023-01-20 (×8): 5 mg via ORAL
  Filled 2023-01-13 (×8): qty 1

## 2023-01-13 MED ORDER — POTASSIUM CHLORIDE 10 MEQ/100ML IV SOLN
10.0000 meq | INTRAVENOUS | Status: AC
  Administered 2023-01-13 (×2): 10 meq via INTRAVENOUS
  Filled 2023-01-13 (×2): qty 100

## 2023-01-13 MED ORDER — MORPHINE SULFATE (PF) 4 MG/ML IV SOLN
4.0000 mg | Freq: Once | INTRAVENOUS | Status: AC
  Administered 2023-01-13: 4 mg via INTRAVENOUS
  Filled 2023-01-13: qty 1

## 2023-01-13 MED ORDER — ONDANSETRON HCL 4 MG/2ML IJ SOLN
4.0000 mg | Freq: Four times a day (QID) | INTRAMUSCULAR | Status: DC | PRN
  Administered 2023-01-16: 4 mg via INTRAVENOUS
  Filled 2023-01-13: qty 2

## 2023-01-13 MED ORDER — SODIUM CHLORIDE 0.9 % IV BOLUS (SEPSIS)
500.0000 mL | Freq: Once | INTRAVENOUS | Status: AC
  Administered 2023-01-13: 500 mL via INTRAVENOUS

## 2023-01-13 MED ORDER — MORPHINE SULFATE (PF) 4 MG/ML IV SOLN: 4.0000 mg | INTRAVENOUS | Status: DC | PRN

## 2023-01-13 MED ORDER — ACETAMINOPHEN 650 MG RE SUPP: 650.0000 mg | Freq: Four times a day (QID) | RECTAL | Status: DC | PRN

## 2023-01-13 MED ORDER — PIPERACILLIN-TAZOBACTAM 3.375 G IVPB 30 MIN
3.3750 g | Freq: Once | INTRAVENOUS | Status: AC
  Administered 2023-01-13: 3.375 g via INTRAVENOUS
  Filled 2023-01-13: qty 50

## 2023-01-13 MED ORDER — ACETAMINOPHEN 325 MG PO TABS
650.0000 mg | ORAL_TABLET | Freq: Four times a day (QID) | ORAL | Status: DC | PRN
  Administered 2023-01-20 (×2): 650 mg via ORAL
  Filled 2023-01-13 (×2): qty 2

## 2023-01-13 NOTE — TOC CM/SW Note (Signed)
Transition of Care [facility]) - Inpatient Brief Assessment   Patient Details  Name: Katherine Stanley MRN: [id number] Date of Birth: [birth year]  [facility]) CM/SW Contact:    Chapman Fitch, RN Phone Number: 01/13/2023, 10:27 AM   Clinical Narrative:   Transition of Care [facility]) Screening Note   Patient Details  Name: Katherine Stanley Date of Birth: [birth year]   Transition of Care [facility]) CM/SW Contact:    Chapman Fitch, RN Phone Number: 01/13/2023, 10:27 AM    Transition of Care Department [facility] has reviewed patient and no TOC needs have been identified at this time. We will continue to monitor patient advancement through interdisciplinary progression rounds. If new patient transition needs arise, please place a TOC consult.    Transition of Care Asessment: Insurance and Status: Insurance coverage has been reviewed Patient has primary care physician: Yes     Prior/Current Home Services: No current home services   Readmission risk has been reviewed: Yes Transition of care needs: no transition of care needs at this time

## 2023-01-13 NOTE — ED Provider Notes (Addendum)
[facility] Provider Note    Event Date/Time   First MD Initiated Contact with Patient 01/13/23 [phone]     (approximate)   History   Abdominal Pain   HPI  Katherine Stanley is a 77 y.o. female with history of hypertension, prior hysterectomy, cholecystectomy who presents to the emergency department with generalized abdominal pain that started Thursday, September 26.  States pain started initially in the right lower quadrant and then became more generalized over the past few days.  No nausea, vomiting or diarrhea.  States she had a normal bowel movement this morning.  Has been passing gas.  Has never had similar symptoms.  No fevers, dysuria, hematuria, vaginal bleeding or discharge.   History provided by patient, husband.    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    Arthritis    Diverticulosis    Headache    occasionally   Hypertension    takes Lisinopril daily   Joint pain    Osteopenia    PONV (postoperative nausea and vomiting)    Slight nausea   Rosacea     Past Surgical History:  Procedure Laterality Date   CATARACT EXTRACTION W/PHACO Left 07/02/2021   Procedure: CATARACT EXTRACTION PHACO AND INTRAOCULAR LENS PLACEMENT (IOC) LEFT EYHANCE 6.84 00:46.3;  Surgeon: Galen Manila, MD;  Location: [facility];  Service: Ophthalmology;  Laterality: Left;   CATARACT EXTRACTION W/PHACO Right 07/16/2021   Procedure: CATARACT EXTRACTION PHACO AND INTRAOCULAR LENS PLACEMENT (IOC) RIGHT EYHANCE 5.89 00:51.9;  Surgeon: Galen Manila, MD;  Location: [facility];  Service: Ophthalmology;  Laterality: Right;   CHOLECYSTECTOMY     CHOLECYSTECTOMY     COLONOSCOPY     COLONOSCOPY WITH PROPOFOL N/A 11/25/2019   Procedure: COLONOSCOPY WITH PROPOFOL;  Surgeon: Pasty Spillers, MD;  Location: [facility];  Service: Gastroenterology;  Laterality: N/A;   EYE SURGERY  2013    LASIK   JOINT REPLACEMENT     REFRACTIVE SURGERY     SHOULDER ARTHROSCOPY WITH OPEN ROTATOR CUFF REPAIR AND DISTAL CLAVICLE ACROMINECTOMY Right 09/19/2014   Procedure: SHOULDER ARTHROSCOPY WITH MINI-OPEN ROTATOR CUFF REPAIR AND DISTAL CLAVICLE RESECTION, SUBACROMIAL DECOMPRESSION;  Surgeon: Valeria Batman, MD;  Location: [facility] OR;  Service: Orthopedics;  Laterality: Right;   TONSILLECTOMY  at age 24   TOTAL HIP ARTHROPLASTY Left 11/20/2020   Procedure: LEFT TOTAL HIP ARTHROPLASTY ANTERIOR APPROACH;  Surgeon: Kathryne Hitch, MD;  Location: [facility];  Service: Orthopedics;  Laterality: Left;  RNFA   TOTAL HIP ARTHROPLASTY Right 04/08/2022   Procedure: RIGHT TOTAL HIP ARTHROPLASTY;  Surgeon: Kathryne Hitch, MD;  Location: [facility] OR;  Service: Orthopedics;  Laterality: Right;  Needs RNFA   TOTAL KNEE ARTHROPLASTY Right 03/18/2016   TOTAL KNEE ARTHROPLASTY Right 03/18/2016   Procedure: TOTAL KNEE ARTHROPLASTY;  Surgeon: Valeria Batman, MD;  Location: [facility] OR;  Service: Orthopedics;  Laterality: Right;   VAGINAL HYSTERECTOMY  at age 71    MEDICATIONS:  Prior to Admission medications   Medication Sig Start Date End Date Taking? Authorizing Provider  acetaminophen (TYLENOL) 650 MG CR tablet Take 1,300 mg by mouth every 8 (eight) hours as needed for pain.    [provider]  aspirin 81 MG chewable tablet Chew 1 tablet (81 mg total) by mouth 2 (two) times daily. 04/09/22   Kathryne Hitch, MD  Calcium 250 MG CAPS Take 250 mg by mouth daily.  Patient not taking: Reported on 10/10/2022    [provider]  cholecalciferol (VITAMIN D) 1000 units tablet Take 1,000 Units by mouth daily.    [provider]  Coenzyme Q10 (COQ-10) 100 MG CAPS Take 100 mg by mouth daily.    [provider]  diclofenac Sodium (VOLTAREN) 1 % GEL Apply 1 application topically 4 (four) times daily as needed (pain).    [provider]  ferrous gluconate (FERGON) 240  (27 FE) MG tablet Take 240 mg by mouth daily.    [provider]  Boris Lown Oil 350 MG CAPS Take 350 mg by mouth daily.    [provider]  lisinopril (ZESTRIL) 10 MG tablet Take 1/2 (one-half) tablet by mouth once daily Strength: 10 mg 09/12/22   Glori Luis, MD  Lysine 500 MG CAPS Take 500 mg by mouth daily as needed (fever blisters).    [provider]  Multiple Vitamin (MULTIVITAMIN WITH MINERALS) TABS tablet Take 1 tablet by mouth daily.    [provider]  Multiple Vitamins-Minerals (PRESERVISION AREDS 2) CAPS Take 1 capsule by mouth daily.    [provider]  mupirocin ointment (BACTROBAN) 2 % Apply to affected areas and inside nose twice daily 11/18/21   Willeen Niece, MD  OVER THE COUNTER MEDICATION Take 1 tablet by mouth daily. Beet root supplement    [provider]  Plant Sterols and Stanols (CHOLEST OFF PO) Take 1 tablet by mouth daily.    [provider]  Potassium 99 MG TABS Take 99 mg by mouth daily.    [provider]  risedronate (ACTONEL) 35 MG tablet TAKE 1 TABLET BY MOUTH EVERY 7 DAYS WITH WATER ON EMPTY STOMACH. NOTHING BY MOUTH OR LIE DOWN FOR NEXT 30 MINUTES 12/10/22   Glori Luis, MD  vitamin B-12 (CYANOCOBALAMIN) 1000 MCG tablet Take 1,000 mcg by mouth daily.    [provider]  White Petrolatum-Mineral Oil (RETAINE PM) OINT Place 1 Application into both eyes See admin instructions. Apply to both eyes every morning, may use again as needed for dryness    [provider]    Physical Exam   Triage Vital Signs: ED Triage Vitals  Encounter Vitals Group     BP 01/13/23 0355 (!) 179/80     Systolic BP Percentile --      Diastolic BP Percentile --      Pulse Rate 01/13/23 0355 (!) 115     Resp 01/13/23 0355 20     Temp 01/13/23 0355 98.8 F (37.1 C)     Temp Source 01/13/23 0355 Oral     SpO2 01/13/23 0355 97 %     Weight 01/13/23 0352 170 lb (77.1 kg)     Height 01/13/23  0352 5' (1.524 m)     Head Circumference --      Peak Flow --      Pain Score 01/13/23 0352 9     Pain Loc --      Pain Education --      Exclude from Growth Chart --     Most recent vital signs: Vitals:   01/13/23 0500 01/13/23 0515  BP: 138/83   Pulse: (!) 104 (!) 108  Resp: (!) 24 19  Temp:    SpO2: 93% 95%    CONSTITUTIONAL: Alert, responds appropriately to questions. Well-appearing; well-nourished HEAD: Normocephalic, atraumatic EYES: Conjunctivae clear, pupils appear equal, sclera nonicteric ENT: normal nose; moist mucous membranes NECK: Supple, normal ROM CARD:  Regular and tachycardic; S1 and S2 appreciated RESP: Normal chest excursion without splinting or tachypnea; breath sounds clear and equal bilaterally; no wheezes, no rhonchi, no rales, no hypoxia or respiratory distress, speaking full sentences ABD/GI: Non-distended; soft, diffusely tender with guarding BACK: The back appears normal EXT: Normal ROM in all joints; no deformity noted, no edema SKIN: Normal color for age and race; warm; no rash on exposed skin NEURO: Moves all extremities equally, normal speech PSYCH: The patient's mood and manner are appropriate.   ED Results / Procedures / Treatments   LABS: (all labs ordered are listed, but only abnormal results are displayed) Labs Reviewed  CBC WITH DIFFERENTIAL/PLATELET - Abnormal; Notable for the following components:      Result Value   WBC 17.5 (*)    Neutro Abs 13.7 (*)    Monocytes Absolute 1.5 (*)    Abs Immature Granulocytes 0.20 (*)    All other components within normal limits  COMPREHENSIVE METABOLIC PANEL - Abnormal; Notable for the following components:   Potassium 3.0 (*)    CO2 21 (*)    Glucose, Bld 155 (*)    Calcium 8.6 (*)    Albumin 3.1 (*)    All other components within normal limits  URINALYSIS, ROUTINE W REFLEX MICROSCOPIC - Abnormal; Notable for the following components:   Color, Urine YELLOW (*)    APPearance CLEAR (*)     All other components within normal limits  CULTURE, BLOOD (ROUTINE X 2)  CULTURE, BLOOD (ROUTINE X 2)  LIPASE, BLOOD  MAGNESIUM  LACTIC ACID, PLASMA  LACTIC ACID, PLASMA  PROCALCITONIN     EKG:    EKG Interpretation Date/Time:  Tuesday January 13 2023 05:59:57 EDT Ventricular Rate:  109 PR Interval:  167 QRS Duration:  102 QT Interval:  351 QTC Calculation: 473 R Axis:   59  Text Interpretation: Sinus tachycardia Multiple premature complexes, vent & supraven Low voltage, precordial leads Baseline wander in lead(s) V6 Confirmed by Rochele Raring [phone]) on 01/13/2023 6:14:50 AM         RADIOLOGY: My personal review and interpretation of imaging: CT concerning for perforated appendicitis.  No free air.  I have personally reviewed all radiology reports.   CT ABDOMEN PELVIS W CONTRAST  Result Date: 01/13/2023 CLINICAL DATA:  Abdominal pain. EXAM: CT ABDOMEN AND PELVIS WITH CONTRAST TECHNIQUE: Multidetector CT imaging of the abdomen and pelvis was performed using the standard protocol following bolus administration of intravenous contrast. RADIATION DOSE REDUCTION: This exam was performed according to the departmental dose-optimization program which includes automated exposure control, adjustment of the mA and/or kV according to patient size and/or use of iterative reconstruction technique. CONTRAST:  OMNIPAQUE IOHEXOL 300 MG/ML  SOLN COMPARISON:  None Available. FINDINGS: Lower chest: Unremarkable. Hepatobiliary: Tiny hypodensity in the lateral segment left liver is much too small to characterize but statistically is likely benign. Similar 7 mm hypodensity noted right liver on 19/2. Cholecystectomy. Common bile duct dilated at 9 mm diameter, likely as sequelae of prior cholecystectomy. Pancreas: No focal mass lesion. No dilatation of the main duct. No intraparenchymal cyst. No peripancreatic edema. Spleen: No splenomegaly. 14 mm subcapsular lesion cannot be definitively  characterized but is likely benign. Adrenals/Urinary Tract: No adrenal nodule or mass. Bilateral renal cysts noted. Tiny well-defined homogeneous low-density lesions in both kidneys are too small to characterize but are statistically most likely benign and probably cysts. No followup imaging is recommended. No evidence for hydroureter. Bladder is obscured by beam hardening  artifact from bilateral hip replacement. Stomach/Bowel: Tiny hiatal hernia. Stomach otherwise unremarkable. Duodenum is normally positioned as is the ligament of Treitz. Duodenal diverticulum noted. No small bowel wall thickening. No small bowel dilatation. Scattered small bowel diverticuli are evident. There is a Peri cecal inflammatory process with a collection of extraluminal gas (47/2). A structure that appears to represent the appendix is seen in a retrocecal position arising from the cecum on image 46/2 in visible on images 46-50 of series 2. This appears dilated in irregular measuring 11 mm and is contained within the posterior aspect of the inflammatory process. The wall of the terminal ileum is circumferentially thickened in there is marked thickening in the wall of the cecum. The remaining portion of the colon shows marked diverticular disease but is otherwise unremarkable. Vascular/Lymphatic: There is mild atherosclerotic calcification of the abdominal aorta without aneurysm. There is no gastrohepatic or hepatoduodenal ligament lymphadenopathy. No retroperitoneal lymphadenopathy. A cluster of upper normal lymph nodes is identified in the ileocolic mesentery (image 41/2). No pelvic sidewall lymphadenopathy. Reproductive: Central pelvis is obscured by beam hardening artifact. No discernible adnexal mass. Other: No substantial intraperitoneal free fluid. Musculoskeletal: Status post bilateral hip replacement. No worrisome lytic or sclerotic osseous abnormality. IMPRESSION: 1. Peri cecal inflammatory process with a collection of  extraluminal gas. Despite the extraluminal gas and inflammation, no discrete or drainable abscess is evident. A structure that appears to represent the appendix is seen in a retrocecal position and is dilated and irregular measuring 11 mm. This appears to be contained within the posterior aspect of the inflammatory process. The wall of the terminal ileum is circumferentially thickened and there is marked thickening in the wall of the cecum. These findings could be compatible with perforated appendicitis with secondary involvement of the terminal ileum and cecum. However, inflammatory terminal ileal process with perforation would also be a consideration. Primary cecal infection/inflammation or neoplasm with localized perforation not excluded. 2. A cluster of upper normal sized lymph nodes is identified in the ileocolic mesentery adjacent to the inflammatory process. 3. Marked colonic diverticulosis without diverticulitis. 4. Tiny hiatal hernia. 5. Tiny well-defined homogeneous low-density lesions in both kidneys are too small to characterize but are statistically most likely benign and probably cysts. 6.  Aortic Atherosclerosis (ICD10-I70.0). Electronically Signed   By: Kennith Center M.D.   On: 01/13/2023 05:40   DG Abd Portable 1 View  Result Date: 01/13/2023 CLINICAL DATA:  Small bowel obstruction. EXAM: PORTABLE ABDOMEN - 1 VIEW COMPARISON:  None Available. FINDINGS: Single upright view of the abdomen shows no evidence for intraperitoneal free air. No gaseous bowel dilatation evident in the visualized upper abdomen. Heart size upper normal. Visualized bony anatomy unremarkable. IMPRESSION: No evidence for intraperitoneal free air. Electronically Signed   By: Kennith Center M.D.   On: 01/13/2023 05:30     PROCEDURES:  Critical Care performed: No   CRITICAL CARE Performed by: Baxter Hire Alekzander Cardell   Total critical care time: 40 minutes  Critical care time was exclusive of separately billable procedures and  treating other patients.  Critical care was necessary to treat or prevent imminent or life-threatening deterioration.  Critical care was time spent personally by me on the following activities: development of treatment plan with patient and/or surrogate as well as nursing, discussions with consultants, evaluation of patient's response to treatment, examination of patient, obtaining history from patient or surrogate, ordering and performing treatments and interventions, ordering and review of laboratory studies, ordering and review of radiographic studies, pulse oximetry and  re-evaluation of patient's condition.   Marland Kitchen1-3 Lead EKG Interpretation  Performed by: Teriah Muela, Layla Maw, DO Authorized by: Tremeka Helbling, Layla Maw, DO     Interpretation: abnormal     ECG rate:  115   ECG rate assessment: tachycardic     Rhythm: sinus tachycardia     Ectopy: none     Conduction: normal       IMPRESSION / MDM / ASSESSMENT AND PLAN / ED COURSE  I reviewed the triage vital signs and the nursing notes.    Patient here with generalized abdominal pain.  The patient is on the cardiac monitor to evaluate for evidence of arrhythmia and/or significant heart rate changes.   DIFFERENTIAL DIAGNOSIS (includes but not limited to):   Bowel obstruction, colitis, diverticulitis, appendicitis, pancreatitis, perforation   Patient's presentation is most consistent with acute presentation with potential threat to life or bodily function.   PLAN: Will obtain labs, urine, CT of the abdomen pelvis.  Will give IV fluids, pain and nausea medicine.  Will keep on cardiac monitoring.   MEDICATIONS GIVEN IN ED: Medications  potassium chloride 10 mEq in 100 mL IVPB (10 mEq Intravenous New Bag/Given 01/13/23 0533)  sodium chloride 0.9 % bolus 1,000 mL (1,000 mLs Intravenous New Bag/Given 01/13/23 0609)  piperacillin-tazobactam (ZOSYN) IVPB 3.375 g (3.375 g Intravenous New Bag/Given 01/13/23 0608)  sodium chloride 0.9 % bolus 500 mL  (500 mLs Intravenous New Bag/Given 01/13/23 0609)  lisinopril (ZESTRIL) tablet 5 mg (has no administration in time range)  piperacillin-tazobactam (ZOSYN) IVPB 3.375 g (has no administration in time range)  enoxaparin (LOVENOX) injection 40 mg (has no administration in time range)  0.9 %  sodium chloride infusion (has no administration in time range)  acetaminophen (TYLENOL) tablet 650 mg (has no administration in time range)    Or  acetaminophen (TYLENOL) suppository 650 mg (has no administration in time range)  HYDROcodone-acetaminophen (NORCO/VICODIN) 5-325 MG per tablet 1-2 tablet (has no administration in time range)  morphine (PF) 4 MG/ML injection 4 mg (has no administration in time range)  ondansetron (ZOFRAN-ODT) disintegrating tablet 4 mg (has no administration in time range)    Or  ondansetron (ZOFRAN) injection 4 mg (has no administration in time range)  pantoprazole (PROTONIX) injection 40 mg (has no administration in time range)  sodium chloride 0.9 % bolus 1,000 mL (0 mLs Intravenous Stopped 01/13/23 0609)  morphine (PF) 4 MG/ML injection 4 mg (4 mg Intravenous Given 01/13/23 0418)  ondansetron (ZOFRAN) injection 4 mg (4 mg Intravenous Given 01/13/23 0418)  iohexol (OMNIPAQUE) 300 MG/ML solution 100 mL (100 mLs Intravenous Contrast Given 01/13/23 0519)     ED COURSE: Given delays to obtain CT of the abdomen pelvis due to waiting for creatinine to result, will obtain abdominal x-ray to rule out free air given she is quite tender on exam although no guarding or rebound.  5:30 AM  Pt's abdominal x-ray reviewed and interpreted by myself and the radiologist and shows no free air.  Pt's labs show leukocytosis of 17,000.  Given this and her tachycardia, will give 30 mL/kg IV fluid bolus and IV Zosyn.  Potassium level is 3.0.  She is getting oral replacement.  Will check magnesium level.  Normal LFTs, lipase.  5:48 AM  Pt's CT scan reviewed and interpreted by myself and the  radiologist and shows an inflammatory process around the cecum with extraluminal gas but no drainable abscess.  This could be perforated appendicitis but also could be a terminal ileal process  with perforation.  Patient does report her pain started in the right lower quadrant and is now more diffuse.  Will discuss with general surgery.  Patient will need admission.   CONSULTS: Discussed with Dr. Maia Plan on-call for general surgery.  Appreciate his help.  He has reviewed patient's imaging and will admit.     OUTSIDE RECORDS REVIEWED: Reviewed last family medicine note on 11/24/2022.       FINAL CLINICAL IMPRESSION(S) / ED DIAGNOSES   Final diagnoses:  Generalized abdominal pain  Hypokalemia  Perforated appendicitis  SIRS (systemic inflammatory response syndrome) (HCC)     Rx / DC Orders   ED Discharge Orders     None        Note:  This document was prepared using Dragon voice recognition software and may include unintentional dictation errors.   Chrishawn Boley, Layla Maw, DO 01/13/23 0618    Walburga Hudman, Layla Maw, DO 01/13/23 [phone]

## 2023-01-13 NOTE — ED Triage Notes (Signed)
Patient ambulatory to triage with steady gait, without difficulty or distress noted; pt reports generalized abd pain since Thurs with no accomp symptoms

## 2023-01-13 NOTE — H&P (Signed)
SURGICAL HISTORY AND PHYSICAL NOTE   HISTORY OF PRESENT ILLNESS (HPI):  77 y.o. female presented to [facility] ED for evaluation of abdominal pain. Patient reports she started having abdominal pain 6 days ago.  She endorses that the pain started in the mid abdomen.  Then pain started localizing to the right lower quadrant.  Pain has been intensifying since the last couple of days.  Interestingly patient denies any nausea or vomiting.  Endorses passing gas.  Denies any diarrhea or constipation.  Denies any fever.  At the ED she was found with severe tenderness on palpation in the right upper quadrant.  No pain in the left side of the abdomen.  She was found with leukocytosis of 17,000.  There is hypokalemia.  Stable renal function.  She had a CT scan of the abdomen and pelvis that shows perforated appendicitis with surrounding phlegmonous process of the base of the cecum and terminal ileum.  Extraluminal air contained within the phlegmon.  No free air in the rest of the abdomen.  No free fluid.  I personally evaluated the images.  Surgery is consulted by Dr. Elesa Massed in this context for evaluation and management of perforated appendicitis.  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    Arthritis    Diverticulosis    Headache    occasionally   Hypertension    takes Lisinopril daily   Joint pain    Osteopenia    PONV (postoperative nausea and vomiting)    Slight nausea   Rosacea      PAST SURGICAL HISTORY (PSH):  Past Surgical History:  Procedure Laterality Date   CATARACT EXTRACTION W/PHACO Left 07/02/2021   Procedure: CATARACT EXTRACTION PHACO AND INTRAOCULAR LENS PLACEMENT (IOC) LEFT EYHANCE 6.84 00:46.3;  Surgeon: Galen Manila, MD;  Location: [facility];  Service: Ophthalmology;  Laterality: Left;   CATARACT EXTRACTION W/PHACO Right 07/16/2021   Procedure: CATARACT EXTRACTION PHACO AND  INTRAOCULAR LENS PLACEMENT (IOC) RIGHT EYHANCE 5.89 00:51.9;  Surgeon: Galen Manila, MD;  Location: [facility];  Service: Ophthalmology;  Laterality: Right;   CHOLECYSTECTOMY     CHOLECYSTECTOMY     COLONOSCOPY     COLONOSCOPY WITH PROPOFOL N/A 11/25/2019   Procedure: COLONOSCOPY WITH PROPOFOL;  Surgeon: Pasty Spillers, MD;  Location: [facility];  Service: Gastroenterology;  Laterality: N/A;   EYE SURGERY  2013   LASIK   JOINT REPLACEMENT     REFRACTIVE SURGERY     SHOULDER ARTHROSCOPY WITH OPEN ROTATOR CUFF REPAIR AND DISTAL CLAVICLE ACROMINECTOMY Right 09/19/2014   Procedure: SHOULDER ARTHROSCOPY WITH MINI-OPEN ROTATOR CUFF REPAIR AND DISTAL CLAVICLE RESECTION, SUBACROMIAL DECOMPRESSION;  Surgeon: Valeria Batman, MD;  Location: [facility] OR;  Service: Orthopedics;  Laterality: Right;   TONSILLECTOMY  at age 71   TOTAL HIP ARTHROPLASTY Left 11/20/2020   Procedure: LEFT TOTAL HIP ARTHROPLASTY ANTERIOR APPROACH;  Surgeon: Kathryne Hitch, MD;  Location: [facility];  Service: Orthopedics;  Laterality: Left;  RNFA   TOTAL HIP ARTHROPLASTY Right 04/08/2022   Procedure: RIGHT TOTAL HIP ARTHROPLASTY;  Surgeon: Kathryne Hitch, MD;  Location: [facility] OR;  Service: Orthopedics;  Laterality: Right;  Needs RNFA   TOTAL KNEE ARTHROPLASTY Right 03/18/2016   TOTAL KNEE ARTHROPLASTY Right 03/18/2016   Procedure: TOTAL KNEE ARTHROPLASTY;  Surgeon: Valeria Batman, MD;  Location: [facility] OR;  Service: Orthopedics;  Laterality: Right;   VAGINAL HYSTERECTOMY  at age 78  MEDICATIONS:  Prior to Admission medications   Medication Sig Start Date End Date Taking? Authorizing Provider  acetaminophen (TYLENOL) 650 MG CR tablet Take 1,300 mg by mouth every 8 (eight) hours as needed for pain.    [provider]  aspirin 81 MG chewable tablet Chew 1 tablet (81 mg total) by mouth 2 (two) times daily. 04/09/22   Kathryne Hitch, MD  Calcium 250 MG CAPS Take 250 mg by mouth  daily. Patient not taking: Reported on 10/10/2022    [provider]  cholecalciferol (VITAMIN D) 1000 units tablet Take 1,000 Units by mouth daily.    [provider]  Coenzyme Q10 (COQ-10) 100 MG CAPS Take 100 mg by mouth daily.    [provider]  diclofenac Sodium (VOLTAREN) 1 % GEL Apply 1 application topically 4 (four) times daily as needed (pain).    [provider]  ferrous gluconate (FERGON) 240 (27 FE) MG tablet Take 240 mg by mouth daily.    [provider]  Boris Lown Oil 350 MG CAPS Take 350 mg by mouth daily.    [provider]  lisinopril (ZESTRIL) 10 MG tablet Take 1/2 (one-half) tablet by mouth once daily Strength: 10 mg 09/12/22   Glori Luis, MD  Lysine 500 MG CAPS Take 500 mg by mouth daily as needed (fever blisters).    [provider]  Multiple Vitamin (MULTIVITAMIN WITH MINERALS) TABS tablet Take 1 tablet by mouth daily.    [provider]  Multiple Vitamins-Minerals (PRESERVISION AREDS 2) CAPS Take 1 capsule by mouth daily.    [provider]  mupirocin ointment (BACTROBAN) 2 % Apply to affected areas and inside nose twice daily 11/18/21   Willeen Niece, MD  OVER THE COUNTER MEDICATION Take 1 tablet by mouth daily. Beet root supplement    [provider]  Plant Sterols and Stanols (CHOLEST OFF PO) Take 1 tablet by mouth daily.    [provider]  Potassium 99 MG TABS Take 99 mg by mouth daily.    [provider]  risedronate (ACTONEL) 35 MG tablet TAKE 1 TABLET BY MOUTH EVERY 7 DAYS WITH WATER ON EMPTY STOMACH. NOTHING BY MOUTH OR LIE DOWN FOR NEXT 30 MINUTES 12/10/22   Glori Luis, MD  vitamin B-12 (CYANOCOBALAMIN) 1000 MCG tablet Take 1,000 mcg by mouth daily.    [provider]  White Petrolatum-Mineral Oil (RETAINE PM) OINT Place 1 Application into both eyes See admin instructions. Apply to both eyes every morning, may use again as needed for dryness     [provider]     ALLERGIES:  Allergies  Allergen Reactions   Alendronate Other (See Comments)    Diffuse myalgia and arthralgia   Aleve [Naproxen Sodium] Nausea And Vomiting    Sick to stomach     SOCIAL HISTORY:  Social History   Socioeconomic History   Marital status: Married    Spouse name: Not on file   Number of children: Not on file   Years of education: Not on file   Highest education level: Associate degree: occupational, Scientist, product/process development, or vocational program  Occupational History   Not on file  Tobacco Use   Smoking status: Never   Smokeless tobacco: Never  Vaping Use   Vaping status: Never Used  Substance and Sexual Activity   Alcohol use: No    Alcohol/week: 0.0 standard drinks of alcohol   Drug use: No   Sexual activity: Yes    Birth  control/protection: Post-menopausal, Surgical  Other Topics Concern   Not on file  Social History Narrative   Lives in [place] with husband. No children. No pets.      Work - retired Nature conservation officer   Diet - regular   Exercise - golf, gardening   Social Determinants of Health   Financial Resource Strain: Low Risk  (10/10/2022)   Overall Financial Resource Strain (CARDIA)    Difficulty of Paying Living Expenses: Not hard at all  Food Insecurity: No Food Insecurity (10/10/2022)   Hunger Vital Sign    Worried About Running Out of Food in the Last Year: Never true    Ran Out of Food in the Last Year: Never true  Transportation Needs: No Transportation Needs (10/10/2022)   PRAPARE - Administrator, Civil Service (Medical): No    Lack of Transportation (Non-Medical): No  Physical Activity: Sufficiently Active (10/10/2022)   Exercise Vital Sign    Days of Exercise per Week: 5 days    Minutes of Exercise per Session: 150+ min  Stress: No Stress Concern Present (10/10/2022)   [employer] of Occupational Health - Occupational Stress Questionnaire    Feeling of Stress : Not at all  Social  Connections: Socially Integrated (10/10/2022)   Social Connection and Isolation Panel [NHANES]    Frequency of Communication with Friends and Family: More than three times a week    Frequency of Social Gatherings with Friends and Family: More than three times a week    Attends Religious Services: More than 4 times per year    Active Member of Clubs or Organizations: Yes    Attends Banker Meetings: More than 4 times per year    Marital Status: Married  Catering manager Violence: Not At Risk (10/10/2022)   Humiliation, Afraid, Rape, and Kick questionnaire    Fear of Current or Ex-Partner: No    Emotionally Abused: No    Physically Abused: No    Sexually Abused: No      FAMILY HISTORY:  Family History  Problem Relation Age of Onset   Hypertension Mother    Heart disease Mother    Kidney disease Mother    Diabetes Father    Hypertension Father    Heart disease Father    Heart disease Brother    Cancer Maternal Aunt    Ovarian cancer Maternal Aunt 69   Stroke Brother    Alzheimer's disease Brother    Diabetes Paternal Grandmother    Breast cancer Neg Hx      REVIEW OF SYSTEMS:  Constitutional: denies weight loss, fever, chills, or sweats  Eyes: denies any other vision changes, history of eye injury  ENT: denies sore throat, hearing problems  Respiratory: denies shortness of breath, wheezing  Cardiovascular: denies chest pain, palpitations  Gastrointestinal: positive abdominal pain, denies nausea and vomiting Genitourinary: denies burning with urination or urinary frequency Musculoskeletal: denies any other joint pains or cramps  Skin: denies any other rashes or skin discolorations  Neurological: denies any other headache, dizziness, weakness  Psychiatric: denies any other depression, anxiety   All other review of systems were negative   VITAL SIGNS:  Temp:  [98.8 F (37.1 C)] 98.8 F (37.1 C) (10/01 0355) Pulse Rate:  [104-116] 108 (10/01 0515) Resp:   [19-29] 19 (10/01 0515) BP: (138-179)/(72-93) 138/83 (10/01 0500) SpO2:  [92 %-97 %] 95 % (10/01 0515) Weight:  [77.1 kg] 77.1 kg (10/01 0352)     Height: 5' (152.4 cm)  Weight: 77.1 kg BMI (Calculated): 33.2   INTAKE/OUTPUT:  This shift: No intake/output data recorded.  Last 2 shifts: @IOLAST2SHIFTS @   PHYSICAL EXAM:  Constitutional:  -- Normal body habitus  -- Awake, alert, and oriented x3  Eyes:  -- Pupils equally round and reactive to light  -- No scleral icterus  Ear, nose, and throat:  -- No jugular venous distension  Pulmonary:  -- No crackles  -- Equal breath sounds bilaterally -- Breathing non-labored at rest Cardiovascular:  -- S1, S2 present  -- No pericardial rubs Gastrointestinal:  -- Abdomen soft, tender to palpation in the right lower quadrant, non-distended, no guarding or rebound tenderness -- No abdominal masses appreciated, pulsatile or otherwise  Musculoskeletal and Integumentary:  -- Wounds or skin discoloration: None appreciated -- Extremities: B/L UE and LE FROM, hands and feet warm, no edema  Neurologic:  -- Motor function: intact and symmetric -- Sensation: intact and symmetric   Labs:     Latest Ref Rng & Units 01/13/2023    4:24 AM 04/09/2022    7:27 AM 04/01/2022    9:24 AM  CBC  WBC 4.0 - 10.5 K/uL 17.5  10.8  10.9   Hemoglobin 12.0 - 15.0 g/dL 16.1  09.6  04.5   Hematocrit 36.0 - 46.0 % 38.0  33.4  37.8   Platelets 150 - 400 K/uL 272  239  248       Latest Ref Rng & Units 01/13/2023    4:24 AM 09/12/2022   11:15 AM 04/09/2022    7:27 AM  CMP  Glucose 70 - 99 mg/dL 409  811  914   BUN 8 - 23 mg/dL 14  19  11    Creatinine 0.44 - 1.00 mg/dL 7.82  9.56  2.13   Sodium 135 - 145 mmol/L 138  137  136   Potassium 3.5 - 5.1 mmol/L 3.0  4.3  3.5   Chloride 98 - 111 mmol/L 105  107  104   CO2 22 - 32 mmol/L 21  23  22    Calcium 8.9 - 10.3 mg/dL 8.6  9.4  9.1   Total Protein 6.5 - 8.1 g/dL 7.0  7.2    Total Bilirubin 0.3 - 1.2 mg/dL 1.1   0.4    Alkaline Phos 38 - 126 U/L 67  80    AST 15 - 41 U/L 23  19    ALT 0 - 44 U/L 31  19      Imaging studies:  EXAM: CT ABDOMEN AND PELVIS WITH CONTRAST   TECHNIQUE: Multidetector CT imaging of the abdomen and pelvis was performed using the standard protocol following bolus administration of intravenous contrast.   RADIATION DOSE REDUCTION: This exam was performed according to the departmental dose-optimization program which includes automated exposure control, adjustment of the mA and/or kV according to patient size and/or use of iterative reconstruction technique.   CONTRAST:  OMNIPAQUE IOHEXOL 300 MG/ML  SOLN   COMPARISON:  None Available.   FINDINGS: Lower chest: Unremarkable.   Hepatobiliary: Tiny hypodensity in the lateral segment left liver is much too small to characterize but statistically is likely benign. Similar 7 mm hypodensity noted right liver on 19/2. Cholecystectomy. Common bile duct dilated at 9 mm diameter, likely as sequelae of prior cholecystectomy.   Pancreas: No focal mass lesion. No dilatation of the main duct. No intraparenchymal cyst. No peripancreatic edema.   Spleen: No splenomegaly. 14 mm subcapsular lesion cannot be definitively characterized but is likely  benign.   Adrenals/Urinary Tract: No adrenal nodule or mass. Bilateral renal cysts noted. Tiny well-defined homogeneous low-density lesions in both kidneys are too small to characterize but are statistically most likely benign and probably cysts. No followup imaging is recommended. No evidence for hydroureter. Bladder is obscured by beam hardening artifact from bilateral hip replacement.   Stomach/Bowel: Tiny hiatal hernia. Stomach otherwise unremarkable. Duodenum is normally positioned as is the ligament of Treitz. Duodenal diverticulum noted. No small bowel wall thickening. No small bowel dilatation. Scattered small bowel diverticuli are evident. There is a Peri cecal  inflammatory process with a collection of extraluminal gas (47/2). A structure that appears to represent the appendix is seen in a retrocecal position arising from the cecum on image 46/2 in visible on images 46-50 of series 2. This appears dilated in irregular measuring 11 mm and is contained within the posterior aspect of the inflammatory process. The wall of the terminal ileum is circumferentially thickened in there is marked thickening in the wall of the cecum. The remaining portion of the colon shows marked diverticular disease but is otherwise unremarkable.   Vascular/Lymphatic: There is mild atherosclerotic calcification of the abdominal aorta without aneurysm. There is no gastrohepatic or hepatoduodenal ligament lymphadenopathy. No retroperitoneal lymphadenopathy. A cluster of upper normal lymph nodes is identified in the ileocolic mesentery (image 41/2). No pelvic sidewall lymphadenopathy.   Reproductive: Central pelvis is obscured by beam hardening artifact. No discernible adnexal mass.   Other: No substantial intraperitoneal free fluid.   Musculoskeletal: Status post bilateral hip replacement. No worrisome lytic or sclerotic osseous abnormality.   IMPRESSION: 1. Peri cecal inflammatory process with a collection of extraluminal gas. Despite the extraluminal gas and inflammation, no discrete or drainable abscess is evident. A structure that appears to represent the appendix is seen in a retrocecal position and is dilated and irregular measuring 11 mm. This appears to be contained within the posterior aspect of the inflammatory process. The wall of the terminal ileum is circumferentially thickened and there is marked thickening in the wall of the cecum. These findings could be compatible with perforated appendicitis with secondary involvement of the terminal ileum and cecum. However, inflammatory terminal ileal process with perforation would also be a  consideration. Primary cecal infection/inflammation or neoplasm with localized perforation not excluded. 2. A cluster of upper normal sized lymph nodes is identified in the ileocolic mesentery adjacent to the inflammatory process. 3. Marked colonic diverticulosis without diverticulitis. 4. Tiny hiatal hernia. 5. Tiny well-defined homogeneous low-density lesions in both kidneys are too small to characterize but are statistically most likely benign and probably cysts. 6.  Aortic Atherosclerosis (ICD10-I70.0).     Electronically Signed   By: Kennith Center M.D.   On: 01/13/2023 05:40  Assessment/Plan:  77 y.o. female with perforated appendicitis, complicated by pertinent comorbidities including hypertension.  Patient with right upper quadrant abdominal pain onset 6 days ago.  Presented with leukocytosis, localized peritonitis in the right lower quadrant on palpation.  Patient without acute abdomen.  Upon evaluation of the images there is concern of perforated appendicitis with phlegmonous process involving the cecum and reactive terminal ileitis.  Since there is no free air out of the phlegmonous process, no acute abdomen I discussed with patient alternative of trying to contain he did phlegmon to see if the body form a drainable abscess to be able to be drained percutaneously.  If this is achieved then patient can be treated with conservative management in combination with antibiotic therapy and eventually  interval appendectomy.  Patient does understand if there is any clinical deterioration such as fever, worsening abdominal pain, she will need more urgent intervention with the risks of needing to perform extensive bowel resection such as right hemicolectomy.  The patient understood understanding this risk and agreed to plan.  Gae Gallop, MD]

## 2023-01-13 NOTE — ED Notes (Signed)
Alerted provider to WBC and tachycardia.

## 2023-01-13 NOTE — Progress Notes (Signed)
CODE SEPSIS - PHARMACY COMMUNICATION  **Broad Spectrum Antibiotics should be administered within 1 hour of Sepsis diagnosis**  Time Code Sepsis Called/Page Received: 1610  Antibiotics Ordered: Zosyn  Time of 1st antibiotic administration: 0608  Otelia Sergeant, PharmD, [facility] 01/13/2023 5:52 AM\

## 2023-01-13 NOTE — Progress Notes (Signed)
Elink monitoring for the code sepsis protocol.  

## 2023-01-14 LAB — BASIC METABOLIC PANEL
Anion gap: 6 (ref 5–15)
BUN: 11 mg/dL (ref 8–23)
CO2: 20 mmol/L — ABNORMAL LOW (ref 22–32)
Calcium: 7.6 mg/dL — ABNORMAL LOW (ref 8.9–10.3)
Chloride: 114 mmol/L — ABNORMAL HIGH (ref 98–111)
Creatinine, Ser: 0.64 mg/dL (ref 0.44–1.00)
GFR, Estimated: >60 mL/min (ref >60)
Glucose, Bld: 105 mg/dL — ABNORMAL HIGH (ref 70–99)
Potassium: 3.2 mmol/L — ABNORMAL LOW (ref 3.5–5.1)
Sodium: 140 mmol/L (ref 135–145)

## 2023-01-14 LAB — CBC
HCT: 33.2 % — ABNORMAL LOW (ref 36.0–46.0)
Hemoglobin: 11.4 g/dL — ABNORMAL LOW (ref 12.0–15.0)
MCH: 30.9 pg (ref 26.0–34.0)
MCHC: 34.3 g/dL (ref 30.0–36.0)
MCV: 90 fL (ref 80.0–100.0)
Platelets: 228 10*3/uL (ref 150–400)
RBC: 3.69 MIL/uL — ABNORMAL LOW (ref 3.87–5.11)
RDW: 12.2 % (ref 11.5–15.5)
WBC: 14.5 10*3/uL — ABNORMAL HIGH (ref 4.0–10.5)
nRBC: 0 % (ref 0.0–0.2)

## 2023-01-14 MED ORDER — POTASSIUM CHLORIDE CRYS ER 20 MEQ PO TBCR
20.0000 meq | EXTENDED_RELEASE_TABLET | Freq: Once | ORAL | Status: AC
  Administered 2023-01-14: 20 meq via ORAL
  Filled 2023-01-14: qty 1

## 2023-01-14 NOTE — Plan of Care (Signed)
  Problem: Clinical Measurements: Goal: Will remain free from infection Outcome: Progressing   Problem: Pain Managment: Goal: General experience of comfort will improve Outcome: Progressing   

## 2023-01-14 NOTE — Progress Notes (Signed)
   01/14/23 1928  Assess: MEWS Score  Temp (!) 100.8 F (38.2 C)  BP (!) 145/79  MAP (mmHg) 98  Pulse Rate (!) 105  Resp 18  SpO2 98 %  O2 Device Room Air  Assess: MEWS Score  MEWS Temp 1  MEWS Systolic 0  MEWS Pulse 1  MEWS RR 0  MEWS LOC 0  MEWS Score 2  MEWS Score Color Yellow  Assess: if the MEWS score is Yellow or Red  Were vital signs accurate and taken at a resting state? Yes  Does the patient meet 2 or more of the SIRS criteria? No  MEWS guidelines implemented  Yes, yellow  Treat  MEWS Interventions Considered administering scheduled or prn medications/treatments as ordered  Take Vital Signs  Increase Vital Sign Frequency  Yellow: Q2hr x1, continue Q4hrs until patient remains green for 12hrs  Escalate  MEWS: Escalate Yellow: Discuss with charge nurse and consider notifying provider and/or RRT  Notify: Charge Nurse/RN  Name of Charge Nurse/RN Notified Gerda Diss, RN  Provider Notification  Provider Name/Title Dr. Maurine Minister, MD  Date Provider Notified 01/14/23  Time Provider Notified 1940  Method of Notification Call  Notification Reason Change in status (elevated temp and HR)  Provider response No new orders  Date of Provider Response 01/14/23  Time of Provider Response 1940  Assess: SIRS CRITERIA  SIRS Temperature  0  SIRS Pulse 1  SIRS Respirations  0  SIRS WBC 0  SIRS Score Sum  1

## 2023-01-14 NOTE — Plan of Care (Signed)
  Problem: Education: Goal: Knowledge of the prescribed therapeutic regimen will improve Outcome: Progressing Goal: Understanding of discharge needs will improve Outcome: Progressing Goal: Individualized Educational Video(s) Outcome: Progressing   Problem: Activity: Goal: Ability to avoid complications of mobility impairment will improve Outcome: Progressing Goal: Ability to tolerate increased activity will improve Outcome: Progressing   Problem: Pain Management: Goal: Pain level will decrease with appropriate interventions Outcome: Progressing   

## 2023-01-14 NOTE — Progress Notes (Signed)
Patient ID: Katherine Stanley, female   DOB: Jan 19, 1946, 77 y.o.   MRN: [id number]     SURGICAL PROGRESS NOTE   Hospital Day(s): 1.   Interval History: Patient seen and examined, no acute events or new complaints overnight. Patient reports feeling well this morning.  She has been having intermittent lower abdominal pain.  Pain does not radiate to other part of body.  Pain is aggravated by movement of the abdominal wall.  No alleviating factors.  Patient denies any nausea or vomiting.  Patient passing gas.  No fever.  Vital signs in last 24 hours: [min-max] current  Temp:  [98.2 F (36.8 C)-98.7 F (37.1 C)] 98.2 F (36.8 C) (10/02 0801) Pulse Rate:  [74-100] 100 (10/02 0801) Resp:  [18-20] 18 (10/02 0801) BP: (113-132)/(53-73) 131/66 (10/02 0801) SpO2:  [96 %-100 %] 98 % (10/02 0801)     Height: 5' (152.4 cm) Weight: 77.1 kg BMI (Calculated): 33.2   Physical Exam:  Constitutional: alert, cooperative and no distress  Respiratory: breathing non-labored at rest  Cardiovascular: regular rate and sinus rhythm  Gastrointestinal: soft, mild tender, and non-distended  Labs:     Latest Ref Rng & Units 01/14/2023    4:57 AM 01/13/2023    4:24 AM 04/09/2022    7:27 AM  CBC  WBC 4.0 - 10.5 K/uL 14.5  17.5  10.8   Hemoglobin 12.0 - 15.0 g/dL 40.9  81.1  91.4   Hematocrit 36.0 - 46.0 % 33.2  38.0  33.4   Platelets 150 - 400 K/uL 228  272  239       Latest Ref Rng & Units 01/14/2023    4:57 AM 01/13/2023    4:24 AM 09/12/2022   11:15 AM  CMP  Glucose 70 - 99 mg/dL 782  956  213   BUN 8 - 23 mg/dL 11  14  19    Creatinine 0.44 - 1.00 mg/dL 0.86  5.78  4.69   Sodium 135 - 145 mmol/L 140  138  137   Potassium 3.5 - 5.1 mmol/L 3.2  3.0  4.3   Chloride 98 - 111 mmol/L 114  105  107   CO2 22 - 32 mmol/L 20  21  23    Calcium 8.9 - 10.3 mg/dL 7.6  8.6  9.4   Total Protein 6.5 - 8.1 g/dL  7.0  7.2   Total Bilirubin 0.3 - 1.2 mg/dL  1.1  0.4   Alkaline Phos 38 - 126 U/L  67  80   AST 15 - 41 U/L   23  19   ALT 0 - 44 U/L  31  19     Imaging studies: No new pertinent imaging studies   Assessment/Plan:  77 y.o. female with perforated appendicitis, complicated by pertinent comorbidities including hypertension.   -Patient continues stable.  No fever -White blood cell count slowly decreasing -Improvement abdominal physical exam with localized tenderness in the right upper quadrant -No acute abdomen -Will continue with plan of treating with IV antibiotic therapy and repeating CT scan 72 hours after initial CT scan for evaluation of possible abscess formation. -Will start liquid diet -Will continue IV antibiotic therapy -Encouraged the patient to ambulate  Gae Gallop, MD

## 2023-01-15 ENCOUNTER — Inpatient Hospital Stay: Payer: Medicare Other

## 2023-01-15 MED ORDER — PANTOPRAZOLE SODIUM 40 MG PO TBEC
40.0000 mg | DELAYED_RELEASE_TABLET | Freq: Every day | ORAL | Status: DC
  Administered 2023-01-15 – 2023-01-19 (×5): 40 mg via ORAL
  Filled 2023-01-15 (×5): qty 1

## 2023-01-15 MED ORDER — IOHEXOL 9 MG/ML PO SOLN
500.0000 mL | ORAL | Status: AC
  Administered 2023-01-15 (×2): 500 mL via ORAL

## 2023-01-15 MED ORDER — IOHEXOL 300 MG/ML  SOLN
100.0000 mL | Freq: Once | INTRAMUSCULAR | Status: AC | PRN
  Administered 2023-01-15: 100 mL via INTRAVENOUS

## 2023-01-15 NOTE — Progress Notes (Signed)
Patient ID: Katherine Stanley, female   DOB: Nov 14, 1945, 77 y.o.   MRN: [id number]     SURGICAL PROGRESS NOTE   Hospital Day(s): 2.   Interval History: Patient seen and examined, no acute events or new complaints overnight. Patient reports continued having intermittent lower abdominal pain.  Pain described as shooting.  Pain in the lower abdomen mainly on the right lower quadrant.  No other medication.  Patient cannot identify any alleviating or aggravating factors.  Patient had low-grade fever yesterday.  Denies any nausea or vomiting.  Continue passing gas.  Vital signs in last 24 hours: [min-max] current  Temp:  [98.2 F (36.8 C)-100.8 F (38.2 C)] 98.3 F (36.8 C) (10/03 0421) Pulse Rate:  [89-105] 95 (10/03 0421) Resp:  [18] 18 (10/03 0421) BP: (127-145)/(66-83) 136/74 (10/03 0421) SpO2:  [97 %-98 %] 98 % (10/03 0421)     Height: 5' (152.4 cm) Weight: 77.1 kg BMI (Calculated): 33.2   Physical Exam:  Constitutional: alert, cooperative and no distress  Respiratory: breathing non-labored at rest  Cardiovascular: regular rate and sinus rhythm  Gastrointestinal: soft, mild-tender, and non-distended  Labs:     Latest Ref Rng & Units 01/14/2023    4:57 AM 01/13/2023    4:24 AM 04/09/2022    7:27 AM  CBC  WBC 4.0 - 10.5 K/uL 14.5  17.5  10.8   Hemoglobin 12.0 - 15.0 g/dL 96.2  95.2  84.1   Hematocrit 36.0 - 46.0 % 33.2  38.0  33.4   Platelets 150 - 400 K/uL 228  272  239       Latest Ref Rng & Units 01/14/2023    4:57 AM 01/13/2023    4:24 AM 09/12/2022   11:15 AM  CMP  Glucose 70 - 99 mg/dL 324  401  027   BUN 8 - 23 mg/dL 11  14  19    Creatinine 0.44 - 1.00 mg/dL 2.53  6.64  4.03   Sodium 135 - 145 mmol/L 140  138  137   Potassium 3.5 - 5.1 mmol/L 3.2  3.0  4.3   Chloride 98 - 111 mmol/L 114  105  107   CO2 22 - 32 mmol/L 20  21  23    Calcium 8.9 - 10.3 mg/dL 7.6  8.6  9.4   Total Protein 6.5 - 8.1 g/dL  7.0  7.2   Total Bilirubin 0.3 - 1.2 mg/dL  1.1  0.4   Alkaline Phos  38 - 126 U/L  67  80   AST 15 - 41 U/L  23  19   ALT 0 - 44 U/L  31  19     Imaging studies: No new pertinent imaging studies   Assessment/Plan:  77 y.o. female with perforated appendicitis, complicated by pertinent comorbidities including hypertension.   -Patient had a low-grade fever yesterday. -Clinically abdominal exam has improved.  Less tender to palpation on the right lower quadrant compared to admission physical exam. -Due to persistent intermittent pain and fever, will repeat CT scan for assessment of possible formation of abscess to see if it is amenable for drainage -Continue IV antibiotic therapy -Encouraged the patient to ambulate -Continue DVT prophylaxis  Gae Gallop, MD

## 2023-01-15 NOTE — Progress Notes (Signed)
PHARMACIST - PHYSICIAN COMMUNICATION  CONCERNING: IV to Oral Route Change Policy  RECOMMENDATION: This patient is receiving pantoprazole by the intravenous route.  Based on criteria approved by the Pharmacy and Therapeutics Committee, the intravenous medication(s) is/are being converted to the equivalent oral dose form(s).  DESCRIPTION: These criteria include: The patient is eating (either orally or via tube) and/or has been taking other orally administered medications for a least 24 hours The patient has no evidence of active gastrointestinal bleeding or impaired GI absorption (gastrectomy, short bowel, patient on TNA or NPO).  If you have questions about this conversion, please contact the Pharmacy Department   Tressie Ellis, [facility] 01/15/2023 8:03 AM

## 2023-01-16 MED ORDER — SODIUM CHLORIDE 0.9 % IV SOLN: INTRAVENOUS | Status: DC | PRN

## 2023-01-16 NOTE — Progress Notes (Signed)
Patient ID: Katherine Stanley, female   DOB: [birth year], 77 y.o.   MRN: [id number]     SURGICAL PROGRESS NOTE   Hospital Day(s): 3.   Interval History: Patient seen and examined, no acute events or new complaints overnight. Patient reports continued doing well with intermittent abdominal pain.  Pain described as stabbing in the lower abdomen.  Pain comes and goes.  Pain control with crepitation.  Patient cannot identify any aggravating factors.  No pain radiation.  Denies any fever.  Denies any nausea or vomiting.  Vital signs in last 24 hours: [min-max] current  Temp:  [98.4 F (36.9 C)-99.9 F (37.7 C)] 98.4 F (36.9 C) (10/04 0801) Pulse Rate:  [90-103] 99 (10/04 0801) Resp:  [16-18] 18 (10/04 0801) BP: (122-137)/(64-79) 133/74 (10/04 0801) SpO2:  [94 %-97 %] 94 % (10/04 0801)     Height: 5' (152.4 cm) Weight: 77.1 kg BMI (Calculated): 33.2   Physical Exam:  Constitutional: alert, cooperative and no distress  Respiratory: breathing non-labored at rest  Cardiovascular: regular rate and sinus rhythm  Gastrointestinal: soft, non-tender, and non-distended  Labs:     Latest Ref Rng & Units 01/14/2023    4:57 AM 01/13/2023    4:24 AM 04/09/2022    7:27 AM  CBC  WBC 4.0 - 10.5 K/uL 14.5  17.5  10.8   Hemoglobin 12.0 - 15.0 g/dL 28.4  13.2  44.0   Hematocrit 36.0 - 46.0 % 33.2  38.0  33.4   Platelets 150 - 400 K/uL 228  272  239       Latest Ref Rng & Units 01/14/2023    4:57 AM 01/13/2023    4:24 AM 09/12/2022   11:15 AM  CMP  Glucose 70 - 99 mg/dL 102  725  366   BUN 8 - 23 mg/dL 11  14  19    Creatinine 0.44 - 1.00 mg/dL 4.40  3.47  4.25   Sodium 135 - 145 mmol/L 140  138  137   Potassium 3.5 - 5.1 mmol/L 3.2  3.0  4.3   Chloride 98 - 111 mmol/L 114  105  107   CO2 22 - 32 mmol/L 20  21  23    Calcium 8.9 - 10.3 mg/dL 7.6  8.6  9.4   Total Protein 6.5 - 8.1 g/dL  7.0  7.2   Total Bilirubin 0.3 - 1.2 mg/dL  1.1  0.4   Alkaline Phos 38 - 126 U/L  67  80   AST 15 - 41 U/L  23   19   ALT 0 - 44 U/L  31  19     Imaging studies: No new pertinent imaging studies   Assessment/Plan:  77 y.o. female with perforated appendicitis, complicated by pertinent comorbidities including hypertension.   -Patient continue without fever -Pain intermittent well-controlled current pain medications -No worsening clinical status.  No acute abdomen -Will continue with plan of IV antibiotic therapy and repeat CT scan on Monday -Continue liquid diet -Encourage patient to ambulate  Gae Gallop, MD

## 2023-01-16 NOTE — Plan of Care (Signed)
  Problem: Education: Goal: Knowledge of the prescribed therapeutic regimen will improve Outcome: Progressing   Problem: Activity: Goal: Ability to avoid complications of mobility impairment will improve Outcome: Progressing Goal: Ability to tolerate increased activity will improve Outcome: Progressing   Problem: Pain Management: Goal: Pain level will decrease with appropriate interventions Outcome: Progressing   Problem: Education: Goal: Knowledge of General Education information will improve Description: Including pain rating scale, medication(s)/side effects and non-pharmacologic comfort measures Outcome: Progressing   Problem: Clinical Measurements: Goal: Ability to maintain clinical measurements within normal limits will improve Outcome: Progressing   Problem: Activity: Goal: Risk for activity intolerance will decrease Outcome: Progressing   Problem: Nutrition: Goal: Adequate nutrition will be maintained Outcome: Progressing   Problem: Elimination: Goal: Will not experience complications related to bowel motility Outcome: Progressing Goal: Will not experience complications related to urinary retention Outcome: Progressing   Problem: Safety: Goal: Ability to remain free from injury will improve Outcome: Progressing

## 2023-01-17 LAB — BASIC METABOLIC PANEL
Anion gap: 11 (ref 5–15)
BUN: 7 mg/dL — ABNORMAL LOW (ref 8–23)
CO2: 23 mmol/L (ref 22–32)
Calcium: 8.4 mg/dL — ABNORMAL LOW (ref 8.9–10.3)
Chloride: 103 mmol/L (ref 98–111)
Creatinine, Ser: 0.56 mg/dL (ref 0.44–1.00)
GFR, Estimated: >60 mL/min (ref >60)
Glucose, Bld: 146 mg/dL — ABNORMAL HIGH (ref 70–99)
Potassium: 2.6 mmol/L — CL (ref 3.5–5.1)
Sodium: 137 mmol/L (ref 135–145)

## 2023-01-17 MED ORDER — POTASSIUM CHLORIDE 10 MEQ/100ML IV SOLN: 10.0000 meq | INTRAVENOUS | Status: DC

## 2023-01-17 MED ORDER — POTASSIUM CHLORIDE 20 MEQ PO PACK: 40.0000 meq | PACK | Freq: Two times a day (BID) | ORAL | Status: DC

## 2023-01-17 MED ORDER — ORAL CARE MOUTH RINSE: 15.0000 mL | OROMUCOSAL | Status: DC | PRN

## 2023-01-17 NOTE — Plan of Care (Signed)

## 2023-01-17 NOTE — Progress Notes (Signed)
Patient ID: Katherine Stanley, female   DOB: 1945/11/23, 77 y.o.   MRN: [id number]     SURGICAL PROGRESS NOTE   Hospital Day(s): 4.   Interval History: Patient seen and examined, no acute events or new complaints overnight. Patient reports feeling well.  Continue having intermittent abdominal pain but less frequent than the day before.  She had 1 episode of fever of 101  Vital signs in last 24 hours: [min-max] current  Temp:  [98.4 F (36.9 C)-101 F (38.3 C)] 98.5 F (36.9 C) (10/05 0352) Pulse Rate:  [88-99] 88 (10/05 0352) Resp:  [18-20] 20 (10/05 0352) BP: (120-136)/(50-76) 130/76 (10/05 0352) SpO2:  [94 %-97 %] 97 % (10/05 0352)     Height: 5' (152.4 cm) Weight: 77.1 kg BMI (Calculated): 33.2   Physical Exam:  Constitutional: alert, cooperative and no distress  Respiratory: breathing non-labored at rest  Cardiovascular: regular rate and sinus rhythm  Gastrointestinal: soft, non-tender, and non-distended  Labs:     Latest Ref Rng & Units 01/14/2023    4:57 AM 01/13/2023    4:24 AM 04/09/2022    7:27 AM  CBC  WBC 4.0 - 10.5 K/uL 14.5  17.5  10.8   Hemoglobin 12.0 - 15.0 g/dL 40.9  81.1  91.4   Hematocrit 36.0 - 46.0 % 33.2  38.0  33.4   Platelets 150 - 400 K/uL 228  272  239       Latest Ref Rng & Units 01/14/2023    4:57 AM 01/13/2023    4:24 AM 09/12/2022   11:15 AM  CMP  Glucose 70 - 99 mg/dL 782  956  213   BUN 8 - 23 mg/dL 11  14  19    Creatinine 0.44 - 1.00 mg/dL 0.86  5.78  4.69   Sodium 135 - 145 mmol/L 140  138  137   Potassium 3.5 - 5.1 mmol/L 3.2  3.0  4.3   Chloride 98 - 111 mmol/L 114  105  107   CO2 22 - 32 mmol/L 20  21  23    Calcium 8.9 - 10.3 mg/dL 7.6  8.6  9.4   Total Protein 6.5 - 8.1 g/dL  7.0  7.2   Total Bilirubin 0.3 - 1.2 mg/dL  1.1  0.4   Alkaline Phos 38 - 126 U/L  67  80   AST 15 - 41 U/L  23  19   ALT 0 - 44 U/L  31  19     Imaging studies: No new pertinent imaging studies   Assessment/Plan:  77 y.o. female with perforated  appendicitis, complicated by pertinent comorbidities including hypertension.   -1 episode of fever but clinically with benign abdominal physical exam.  This is most likely related to formation of abscess.  No acute abdomen -Will continue with IV antibiotic therapy, full liquids -Will keep plan of repeating CT scan of the abdomen and pelvis on Monday -Encouraged the patient to ambulate -Continue pain management  Gae Gallop, MD

## 2023-01-17 NOTE — Progress Notes (Signed)
Manuela Schwartz NO notified of critical potassium on 2.6

## 2023-01-18 LAB — BASIC METABOLIC PANEL
Anion gap: 11 (ref 5–15)
BUN: 6 mg/dL — ABNORMAL LOW (ref 8–23)
CO2: 24 mmol/L (ref 22–32)
Calcium: 8 mg/dL — ABNORMAL LOW (ref 8.9–10.3)
Chloride: 105 mmol/L (ref 98–111)
Creatinine, Ser: 0.54 mg/dL (ref 0.44–1.00)
GFR, Estimated: >60 mL/min (ref >60)
Glucose, Bld: 119 mg/dL — ABNORMAL HIGH (ref 70–99)
Potassium: 2.9 mmol/L — ABNORMAL LOW (ref 3.5–5.1)
Sodium: 140 mmol/L (ref 135–145)

## 2023-01-18 LAB — CBC
HCT: 31.5 % — ABNORMAL LOW (ref 36.0–46.0)
Hemoglobin: 10.7 g/dL — ABNORMAL LOW (ref 12.0–15.0)
MCH: 30.7 pg (ref 26.0–34.0)
MCHC: 34 g/dL (ref 30.0–36.0)
MCV: 90.5 fL (ref 80.0–100.0)
Platelets: 306 10*3/uL (ref 150–400)
RBC: 3.48 MIL/uL — ABNORMAL LOW (ref 3.87–5.11)
RDW: 12.2 % (ref 11.5–15.5)
WBC: 10.4 10*3/uL (ref 4.0–10.5)
nRBC: 0 % (ref 0.0–0.2)

## 2023-01-18 LAB — CULTURE, BLOOD (ROUTINE X 2)
Culture: NO GROWTH
Culture: NO GROWTH
Special Requests: ADEQUATE

## 2023-01-18 LAB — MAGNESIUM
Magnesium: 2.1 mg/dL (ref 1.7–2.4)
Magnesium: 1.8 mg/dL (ref 1.7–2.4)

## 2023-01-18 MED ORDER — POTASSIUM CHLORIDE 10 MEQ/100ML IV SOLN
10.0000 meq | INTRAVENOUS | Status: AC
  Administered 2023-01-18 (×4): 10 meq via INTRAVENOUS
  Filled 2023-01-18 (×4): qty 100

## 2023-01-18 MED ORDER — POTASSIUM CHLORIDE CRYS ER 20 MEQ PO TBCR
40.0000 meq | EXTENDED_RELEASE_TABLET | ORAL | Status: AC
  Administered 2023-01-18 (×2): 40 meq via ORAL
  Filled 2023-01-18 (×2): qty 2

## 2023-01-18 MED ORDER — POTASSIUM CHLORIDE 10 MEQ/100ML IV SOLN
10.0000 meq | INTRAVENOUS | Status: AC
  Administered 2023-01-18 (×4): 10 meq via INTRAVENOUS
  Filled 2023-01-18 (×2): qty 100

## 2023-01-18 NOTE — Progress Notes (Signed)
Patient ID: Katherine Stanley, female   DOB: 06/21/1945, 77 y.o.   MRN: [id number]     SURGICAL PROGRESS NOTE   Hospital Day(s): 5.   Interval History: Patient seen and examined, no acute events or new complaints overnight. Patient reports feeling well. Patient endorses not having abdominal pain in the last 24 hours.  No fever.  Patient having bowel movement.  No nausea or vomiting.  Vital signs in last 24 hours: [min-max] current  Temp:  [98 F (36.7 C)-99.2 F (37.3 C)] 98.7 F (37.1 C) (10/06 0428) Pulse Rate:  [89-100] 100 (10/06 0428) Resp:  [18] 18 (10/06 0428) BP: (124-139)/(62-82) 139/82 (10/06 0428) SpO2:  [95 %-97 %] 95 % (10/06 0428)     Height: 5' (152.4 cm) Weight: 77.1 kg BMI (Calculated): 33.2   Physical Exam:  Constitutional: alert, cooperative and no distress  Respiratory: breathing non-labored at rest  Cardiovascular: regular rate and sinus rhythm  Gastrointestinal: soft, non-tender, and non-distended  Labs:     Latest Ref Rng & Units 01/18/2023    3:29 AM 01/14/2023    4:57 AM 01/13/2023    4:24 AM  CBC  WBC 4.0 - 10.5 K/uL 10.4  14.5  17.5   Hemoglobin 12.0 - 15.0 g/dL 47.8  29.5  62.1   Hematocrit 36.0 - 46.0 % 31.5  33.2  38.0   Platelets 150 - 400 K/uL 306  228  272       Latest Ref Rng & Units 01/18/2023    3:29 AM 01/17/2023    7:39 PM 01/14/2023    4:57 AM  CMP  Glucose 70 - 99 mg/dL 308  657  846   BUN 8 - 23 mg/dL 6  7  11    Creatinine 0.44 - 1.00 mg/dL 9.62  9.52  8.41   Sodium 135 - 145 mmol/L 140  137  140   Potassium 3.5 - 5.1 mmol/L 2.9  2.6  3.2   Chloride 98 - 111 mmol/L 105  103  114   CO2 22 - 32 mmol/L 24  23  20    Calcium 8.9 - 10.3 mg/dL 8.0  8.4  7.6     Imaging studies: No new pertinent imaging studies   Assessment/Plan:  77 y.o. female with perforated appendicitis, complicated by pertinent comorbidities including hypertension.  -No fever in last 24 hours -Improvement.  Pain -White blood cell count normalized -Will repeat  CT scan of the abdomen and pelvis tomorrow -Continue IV antibiotic therapy -Replace potassium -Continue pain management  Gae Gallop, MD

## 2023-01-18 NOTE — Plan of Care (Signed)
  Problem: Coping: Goal: Level of anxiety will decrease Outcome: Progressing   Problem: Pain Managment: Goal: General experience of comfort will improve Outcome: Progressing   

## 2023-01-18 NOTE — Plan of Care (Signed)
  Problem: Activity: Goal: Ability to avoid complications of mobility impairment will improve Outcome: Progressing Goal: Ability to tolerate increased activity will improve Outcome: Progressing   Problem: Pain Management: Goal: Pain level will decrease with appropriate interventions Outcome: Progressing   Problem: Skin Integrity: Goal: Will show signs of wound healing Outcome: Progressing   Problem: Clinical Measurements: Goal: Will remain free from infection Outcome: Progressing   Problem: Nutrition: Goal: Adequate nutrition will be maintained Outcome: Progressing

## 2023-01-19 ENCOUNTER — Inpatient Hospital Stay: Payer: Medicare Other

## 2023-01-19 MED ORDER — IOHEXOL 300 MG/ML  SOLN
100.0000 mL | Freq: Once | INTRAMUSCULAR | Status: AC | PRN
  Administered 2023-01-19: 100 mL via INTRAVENOUS

## 2023-01-19 MED ORDER — IOHEXOL 9 MG/ML PO SOLN
500.0000 mL | ORAL | Status: AC
  Administered 2023-01-19 (×2): 500 mL via ORAL

## 2023-01-19 NOTE — Plan of Care (Signed)
  Problem: Education: Goal: Understanding of discharge needs will improve Outcome: Progressing   Problem: Education: Goal: Knowledge of General Education information will improve Description: Including pain rating scale, medication(s)/side effects and non-pharmacologic comfort measures Outcome: Progressing   Problem: Health Behavior/Discharge Planning: Goal: Ability to manage health-related needs will improve Outcome: Progressing   

## 2023-01-19 NOTE — Care Management Important Message (Signed)
Important Message  Patient Details  Name: AVAEH EWER MRN: [id number] Date of Birth: 11/10/1945   Important Message Given:  Yes - Medicare IM     Bernadette Hoit 01/19/2023, 3:33 PM

## 2023-01-19 NOTE — Progress Notes (Signed)
Patient ID: Katherine Stanley, female   DOB: 08-24-1945, 77 y.o.   MRN: [id number]     SURGICAL PROGRESS NOTE   Hospital Day(s): 6.   Interval History: Patient seen and examined, no acute events or new complaints overnight. Patient reports feeling well.  She denies episodes of abdominal pain during the night.  She denies any nausea or vomiting.  Vital signs in last 24 hours: [min-max] current  Temp:  [98.2 F (36.8 C)-98.6 F (37 C)] 98.2 F (36.8 C) (10/07 1653) Pulse Rate:  [75-88] 85 (10/07 1653) Resp:  [16-18] 18 (10/07 1653) BP: (135-153)/(74-83) 135/83 (10/07 1653) SpO2:  [93 %-96 %] 95 % (10/07 1653)     Height: 5' (152.4 cm) Weight: 77.1 kg BMI (Calculated): 33.2   Physical Exam:  Constitutional: alert, cooperative and no distress  Respiratory: breathing non-labored at rest  Cardiovascular: regular rate and sinus rhythm  Gastrointestinal: soft, non-tender, and non-distended  Labs:     Latest Ref Rng & Units 01/18/2023    3:29 AM 01/14/2023    4:57 AM 01/13/2023    4:24 AM  CBC  WBC 4.0 - 10.5 K/uL 10.4  14.5  17.5   Hemoglobin 12.0 - 15.0 g/dL 30.8  65.7  84.6   Hematocrit 36.0 - 46.0 % 31.5  33.2  38.0   Platelets 150 - 400 K/uL 306  228  272       Latest Ref Rng & Units 01/18/2023    3:29 AM 01/17/2023    7:39 PM 01/14/2023    4:57 AM  CMP  Glucose 70 - 99 mg/dL 962  952  841   BUN 8 - 23 mg/dL 6  7  11    Creatinine 0.44 - 1.00 mg/dL 3.24  4.01  0.27   Sodium 135 - 145 mmol/L 140  137  140   Potassium 3.5 - 5.1 mmol/L 2.9  2.6  3.2   Chloride 98 - 111 mmol/L 105  103  114   CO2 22 - 32 mmol/L 24  23  20    Calcium 8.9 - 10.3 mg/dL 8.0  8.4  7.6     Imaging studies: CT scan of the abdomen showing improvement of the phlegmonous process in the right lower quadrant.  Abscess has resolved.   Assessment/Plan:  77 y.o. female with perforated appendicitis, complicated by pertinent comorbidities including hypertension.   -Continue without fever -No pain in the last  48 hours -White blood cell count normalized -CT scan of the abdomen and pelvis shows resolution of the abscess.  Still with expected thickening of the sigmoid and appendix.  Will complete IV antibiotics today and possible discharge tomorrow with oral antibiotic therapy -Diet advance to soft diet  Gae Gallop, MD

## 2023-01-19 NOTE — Plan of Care (Signed)

## 2023-01-20 MED ORDER — AMOXICILLIN-POT CLAVULANATE 875-125 MG PO TABS: 1.0000 | ORAL_TABLET | Freq: Two times a day (BID) | ORAL | 0 refills | Status: AC

## 2023-01-20 NOTE — Progress Notes (Signed)
Discharge instructions were reviewed with husband and patient. Questions were answered and encourage. Prn tylenol given for a headache and schedule meds were given as well. IV will be removed once IV antibiotic is done. Belongings collected by patient.

## 2023-01-20 NOTE — Discharge Instructions (Signed)
  Diet: Resume home heart healthy regular diet.   Activity: Increase activity as tolerated.   Medications: Resume all home medications. For mild to moderate pain: acetaminophen (Tylenol) or ibuprofen (if no kidney disease). Combining Tylenol with alcohol can substantially increase your risk of causing liver disease.   Take antibiotic therapy as prescribed for 7 days  Call office [phone]) at any time if any questions, worsening pain, fevers/chills, bleeding, drainage from incision site, or other concerns.

## 2023-01-20 NOTE — Discharge Summary (Signed)
Patient ID: Katherine Stanley MRN: [id number] DOB/AGE: 05/03/1945 77 y.o.  Admit date: 01/13/2023 Discharge date: 01/20/2023   Discharge Diagnoses:  Principal Problem:   Acute appendicitis with localized peritonitis   Procedures: None  Hospital Course: Patient admitted with perforated appendicitis with phlegmon process.  She was treated with IV antibiotic therapy.  Multiple repeated CT scan shows no formation of abscess.  CT scan done yesterday shows resolution of the phlegmonous process.  Patient without pain for the last 72 hours.  No fever.  Abdominal exam without tenderness.  White blood cell count normalized.  Physical Exam Vitals reviewed.  HENT:     Head: Normocephalic.  Cardiovascular:     Rate and Rhythm: Normal rate and regular rhythm.  Pulmonary:     Effort: Pulmonary effort is normal.  Abdominal:     General: Abdomen is flat. Bowel sounds are normal. There is no distension.     Palpations: Abdomen is soft.  Skin:    General: Skin is warm.     Capillary Refill: Capillary refill takes less than 2 seconds.  Neurological:     Mental Status: She is alert and oriented to person, place, and time.      Consults: None  Disposition: Discharge disposition: 01-Home or Self Care       Discharge Instructions     Diet - low sodium heart healthy   Complete by: As directed    Increase activity slowly   Complete by: As directed       Allergies as of 01/20/2023       Reactions   Alendronate Other (See Comments)   Diffuse myalgia and arthralgia   Aleve [naproxen Sodium] Nausea And Vomiting   Sick to stomach        Medication List     TAKE these medications    acetaminophen 650 MG CR tablet Commonly known as: TYLENOL Take 1,300 mg by mouth every 8 (eight) hours as needed for pain.   amoxicillin-clavulanate 875-125 MG tablet Commonly known as: AUGMENTIN Take 1 tablet by mouth 2 (two) times daily for 7 days.   aspirin 81 MG chewable tablet Chew 1  tablet (81 mg total) by mouth 2 (two) times daily. What changed: when to take this   cholecalciferol 1000 units tablet Commonly known as: VITAMIN D Take 1,000 Units by mouth daily.   CHOLEST OFF PO Take 1 tablet by mouth daily.   CoQ-10 100 MG Caps Take 100 mg by mouth daily.   cyanocobalamin 1000 MCG tablet Commonly known as: VITAMIN B12 Take 1,000 mcg by mouth daily.   diclofenac Sodium 1 % Gel Commonly known as: VOLTAREN Apply 1 application topically 4 (four) times daily as needed (pain).   ferrous gluconate 240 (27 FE) MG tablet Commonly known as: FERGON Take 240 mg by mouth daily.   Krill Oil 350 MG Caps Take 350 mg by mouth daily.   lisinopril 10 MG tablet Commonly known as: ZESTRIL Take 1/2 (one-half) tablet by mouth once daily Strength: 10 mg   Lysine 500 MG Caps Take 500 mg by mouth daily as needed (fever blisters).   multivitamin with minerals Tabs tablet Take 1 tablet by mouth daily.   mupirocin ointment 2 % Commonly known as: BACTROBAN Apply to affected areas and inside nose twice daily   OVER THE COUNTER MEDICATION Take 1 tablet by mouth daily. Beet root supplement   Potassium 99 MG Tabs Take 99 mg by mouth daily.   PreserVision AREDS 2 Caps Take 1  capsule by mouth daily.   Retaine PM Oint Place 1 Application into both eyes See admin instructions. Apply to both eyes every morning, may use again as needed for dryness   risedronate 35 MG tablet Commonly known as: ACTONEL TAKE 1 TABLET BY MOUTH EVERY 7 DAYS WITH WATER ON EMPTY STOMACH. NOTHING BY MOUTH OR LIE DOWN FOR NEXT 30 MINUTES        This discharge encounter was for 35-minute most of the time counseling the patient and coordinating plan of care.

## 2023-01-21 ENCOUNTER — Telehealth: Payer: Self-pay

## 2023-01-21 NOTE — Transitions of Care (Post Inpatient/ED Visit) (Signed)
01/21/2023  Name: Katherine Stanley MRN: [id number] DOB: Jun 20, 1945  Today's TOC FU Call Status: Today's TOC FU Call Status:: Successful TOC FU Call Completed TOC FU Call Complete Date: 01/21/23 Patient's Name and Date of Birth confirmed.  Transition Care Management Follow-up Telephone Call Date of Discharge: 01/20/23 Discharge Facility: [facility] [place]) Type of Discharge: Inpatient Admission Primary Inpatient Discharge Diagnosis:: appendicitus ( perforated) How have you been since you were released from the hospital?: Better (Reports she is better. No abd pain.  No pain, No NVD) Any questions or concerns?: No  Items Reviewed: Did you receive and understand the discharge instructions provided?: Yes Medications obtained,verified, and reconciled?: Yes (Medications Reviewed) Any new allergies since your discharge?: No Dietary orders reviewed?: Yes Type of Diet Ordered:: low salt heart healthy Do you have support at home?: Yes People in Home: spouse Name of Support/Comfort Primary Source: Billy  Medications Reviewed Today: Medications Reviewed Today     Reviewed by Earlie Server, RN (Registered Nurse) on 01/21/23 at 1115  Med List Status: <None>   Medication Order Taking? Sig Documenting Provider Last Dose Status Informant  acetaminophen (TYLENOL) 650 MG CR tablet [id number] Yes Take 1,300 mg by mouth every 8 (eight) hours as needed for pain. [provider] Taking Active Self  amoxicillin-clavulanate (AUGMENTIN) 875-125 MG tablet [id number] Yes Take 1 tablet by mouth 2 (two) times daily for 7 days. Carolan Shiver, MD Taking Active   aspirin 81 MG chewable tablet [id number] Yes Chew 1 tablet (81 mg total) by mouth 2 (two) times daily.  Patient taking differently: Chew 81 mg by mouth daily.   Kathryne Hitch, MD Taking Active Self           Med Note Burnis Medin D   Tue Jan 13, 2023 11:12 AM)    cholecalciferol (VITAMIN D) 1000 units  tablet [id number] Yes Take 1,000 Units by mouth daily. [provider] Taking Active Self  Coenzyme Q10 (COQ-10) 100 MG CAPS [id number] Yes Take 100 mg by mouth daily. [provider] Taking Active Self  diclofenac Sodium (VOLTAREN) 1 % GEL [id number] Yes Apply 1 application topically 4 (four) times daily as needed (pain). [provider] Taking Active Self  ferrous gluconate (FERGON) 240 (27 FE) MG tablet [id number] Yes Take 240 mg by mouth daily. [provider] Taking Active Self  Krill Oil 350 MG CAPS [id number] Yes Take 350 mg by mouth daily. [provider] Taking Active Self  lisinopril (ZESTRIL) 10 MG tablet [id number] Yes Take 1/2 (one-half) tablet by mouth once daily Strength: 10 mg Glori Luis, MD Taking Active   Lysine 500 MG CAPS [id number] Yes Take 500 mg by mouth daily as needed (fever blisters). [provider] Taking Active Self  Multiple Vitamin (MULTIVITAMIN WITH MINERALS) TABS tablet [id number] Yes Take 1 tablet by mouth daily. [provider] Taking Active Self  Multiple Vitamins-Minerals (PRESERVISION AREDS 2) CAPS [id number] Yes Take 1 capsule by mouth daily. [provider] Taking Active Self  mupirocin ointment (BACTROBAN) 2 % [id number] Yes Apply to affected areas and inside nose twice daily Willeen Niece, MD Taking Active Self           Med Note Pasty Arch, [place]   Fri Oct 10, 2022  8:26 AM) As needed  OVER THE COUNTER MEDICATION [id number] Yes Take 1 tablet by mouth daily. Beet root supplement [provider] Taking Active Self  Plant Sterols and Stanols (CHOLEST OFF PO) [id number] Yes Take 1  tablet by mouth daily. [provider] Taking Active Self  Potassium 99 MG TABS [id number] Yes Take 99 mg by mouth daily. [provider] Taking Active Self  risedronate (ACTONEL) 35 MG tablet [id number] Yes TAKE 1 TABLET BY MOUTH EVERY 7 DAYS WITH WATER ON EMPTY STOMACH. NOTHING BY MOUTH OR LIE  DOWN FOR NEXT 30 MINUTES Glori Luis, MD Taking Active   vitamin B-12 (CYANOCOBALAMIN) 1000 MCG tablet [id number] Yes Take 1,000 mcg by mouth daily. [provider] Taking Active Self  White Petrolatum-Mineral Oil (RETAINE PM) [facility] [employer] [id number] Yes Place 1 Application into both eyes See admin instructions. Apply to both eyes every morning, may use again as needed for dryness [provider] Taking Active Self            Home Care and Equipment/Supplies: Were Home Health Services Ordered?: No Any new equipment or medical supplies ordered?: No  Functional Questionnaire: Do you need assistance with bathing/showering or dressing?: No Do you need assistance with meal preparation?: No Do you need assistance with eating?: No Do you have difficulty maintaining continence: No Do you need assistance with getting out of bed/getting out of a chair/moving?: No Do you have difficulty managing or taking your medications?: No  Follow up appointments reviewed: PCP Follow-up appointment confirmed?: Yes Date of PCP follow-up appointment?:  (not schedule yet and patient wants to do this herself) Specialist Hospital Follow-up appointment confirmed?: No Reason Specialist Follow-Up Not Confirmed: Patient has Specialist Provider Number and will Call for Appointment Do you need transportation to your follow-up appointment?: No Do you understand care options if your condition(s) worsen?: Yes-patient verbalized understanding  SDOH Interventions Today    Flowsheet Row Most Recent Value  SDOH Interventions   Food Insecurity Interventions Intervention Not Indicated  Transportation Interventions Intervention Not Indicated     Denies any needs. Reports she will call her PCP herself to schedule followup. Reminded patient the importance of call MD for fever, NVD or return of pain. She verbalized understanding.   Marland Kitchenars

## 2023-01-23 ENCOUNTER — Ambulatory Visit: Payer: Medicare Other | Admitting: Family Medicine

## 2023-01-23 ENCOUNTER — Encounter: Payer: Self-pay | Admitting: Family Medicine

## 2023-01-23 VITALS — BP 148/90 | HR 98 | Temp 98.2°F | Ht 60.0 in | Wt 168.8 lb

## 2023-01-23 DIAGNOSIS — I10 Essential (primary) hypertension: Secondary | ICD-10-CM | POA: Diagnosis not present

## 2023-01-23 DIAGNOSIS — Z23 Encounter for immunization: Secondary | ICD-10-CM

## 2023-01-23 DIAGNOSIS — K3533 Acute appendicitis with perforation and localized peritonitis, with abscess: Secondary | ICD-10-CM | POA: Diagnosis not present

## 2023-01-23 LAB — CBC
HCT: 39.9 % (ref 36.0–46.0)
Hemoglobin: 12.8 g/dL (ref 12.0–15.0)
MCHC: 32.2 g/dL (ref 30.0–36.0)
MCV: 93.4 fL (ref 78.0–100.0)
Platelets: 464 10*3/uL — ABNORMAL HIGH (ref 150.0–400.0)
RBC: 4.27 Mil/uL (ref 3.87–5.11)
RDW: 12.9 % (ref 11.5–15.5)
WBC: 7.6 10*3/uL (ref 4.0–10.5)

## 2023-01-23 LAB — BASIC METABOLIC PANEL
BUN: 11 mg/dL (ref 6–23)
CO2: 25 meq/L (ref 19–32)
Calcium: 9.5 mg/dL (ref 8.4–10.5)
Chloride: 108 meq/L (ref 96–112)
Creatinine, Ser: 0.66 mg/dL (ref 0.40–1.20)
GFR: 85.47 mL/min (ref >60.00)
Glucose, Bld: 112 mg/dL — ABNORMAL HIGH (ref 70–99)
Potassium: 3.8 meq/L (ref 3.5–5.1)
Sodium: 145 meq/L (ref 135–145)

## 2023-01-23 NOTE — Progress Notes (Signed)
Marikay Alar, MD Phone: [phone]  Katherine Stanley is a 77 y.o. female who presents today for follow-up.  Appendicitis: Patient presented to the emergency department after having 6 days of abdominal pain.  This was in her right lower quadrant.  She was found to have leukocytosis, hypokalemia, and perforated appendicitis with surrounding phlegmonous process.  There was free air within the phlegmonous process.  No free air in the rest of the abdomen.  She was evaluated by general surgery and admitted to the hospital for IV antibiotics.  They opted against surgery at this time.  Since discharge patient notes no abdominal pain, nausea, or vomiting.  She is supposed to follow-up with the surgeon though has not heard from them yet.  They plan to do a colonoscopy and then do an appendectomy after.  Hypertension: Patient's blood pressure was 120s over 70s prior to getting sick.  She notes in the past antibiotics have run her blood pressure up.  She continues on lisinopril.  BP has been elevated at home since discharge from the hospital.  Social History   Tobacco Use  Smoking Status Never  Smokeless Tobacco Never    Current Outpatient Medications on File Prior to Visit  Medication Sig Dispense Refill   amoxicillin-clavulanate (AUGMENTIN) 875-125 MG tablet Take 1 tablet by mouth 2 (two) times daily for 7 days. 14 tablet 0   cholecalciferol (VITAMIN D) 1000 units tablet Take 1,000 Units by mouth daily.     Coenzyme Q10 (COQ-10) 100 MG CAPS Take 100 mg by mouth daily.     ferrous gluconate (FERGON) 240 (27 FE) MG tablet Take 240 mg by mouth daily.     Krill Oil 350 MG CAPS Take 350 mg by mouth daily.     lisinopril (ZESTRIL) 10 MG tablet Take 1/2 (one-half) tablet by mouth once daily Strength: 10 mg 45 tablet 2   Lysine 500 MG CAPS Take 500 mg by mouth daily as needed (fever blisters).     Multiple Vitamin (MULTIVITAMIN WITH MINERALS) TABS tablet Take 1 tablet by mouth daily.     Multiple  Vitamins-Minerals (PRESERVISION AREDS 2) CAPS Take 1 capsule by mouth daily.     OVER THE COUNTER MEDICATION Take 1 tablet by mouth daily. Beet root supplement     Plant Sterols and Stanols (CHOLEST OFF PO) Take 1 tablet by mouth daily.     Potassium 99 MG TABS Take 99 mg by mouth daily.     risedronate (ACTONEL) 35 MG tablet TAKE 1 TABLET BY MOUTH EVERY 7 DAYS WITH WATER ON EMPTY STOMACH. NOTHING BY MOUTH OR LIE DOWN FOR NEXT 30 MINUTES 4 tablet 3   vitamin B-12 (CYANOCOBALAMIN) 1000 MCG tablet Take 1,000 mcg by mouth daily.     White Petrolatum-Mineral Oil (RETAINE PM) OINT Place 1 Application into both eyes See admin instructions. Apply to both eyes every morning, may use again as needed for dryness     No current facility-administered medications on file prior to visit.     ROS see history of present illness  Objective  Physical Exam Vitals:   01/23/23 1123  BP: (!) 148/90  Pulse: 98  Temp: 98.2 F (36.8 C)  SpO2: 96%    BP Readings from Last 3 Encounters:  01/23/23 (!) 148/90  01/20/23 (!) 157/73  11/24/22 (!) 142/84   Wt Readings from Last 3 Encounters:  01/23/23 168 lb 12.8 oz (76.6 kg)  01/13/23 170 lb (77.1 kg)  11/24/22 170 lb (77.1 kg)  Physical Exam Constitutional:      General: She is not in acute distress.    Appearance: She is not diaphoretic.  Cardiovascular:     Rate and Rhythm: Normal rate and regular rhythm.     Heart sounds: Normal heart sounds.  Pulmonary:     Effort: Pulmonary effort is normal.     Breath sounds: Normal breath sounds.  Abdominal:     General: Bowel sounds are normal. There is no distension.     Palpations: Abdomen is soft.     Tenderness: There is abdominal tenderness (Slight discomfort in right lower quadrant). There is no guarding.  Skin:    General: Skin is warm and dry.  Neurological:     Mental Status: She is alert.      Assessment/Plan: Please see individual problem list.  Acute appendicitis with perforation,  localized peritonitis, and abscess, without gangrene Assessment & Plan: Patient is drastically improved.  She will finish up her course of Augmentin 1 tablet twice daily.  If she does not hear from her surgeons office in the next week she will contact them.  Advised to seek medical attention in the emergency department if she has worsening abdominal discomfort.  Orders: -     Basic metabolic panel -     CBC  Primary hypertension Assessment & Plan: Chronic issue.  Slightly elevated at this time though patient has recently been ill.  She will monitor at home and let us know if her blood pressure does not trend down when she comes off the antibiotic.  She will continue lisinopril 5 mg daily.   Encounter for administration of vaccine -     Flu Vaccine Trivalent High Dose (Fluad)    Return if symptoms worsen or fail to improve.   Marikay Alar, MD [facility]

## 2023-01-23 NOTE — Assessment & Plan Note (Signed)
Chronic issue.  Slightly elevated at this time though patient has recently been ill.  She will monitor at home and let us know if her blood pressure does not trend down when she comes off the antibiotic.  She will continue lisinopril 5 mg daily.

## 2023-01-23 NOTE — Assessment & Plan Note (Signed)
Patient is drastically improved.  She will finish up her course of Augmentin 1 tablet twice daily.  If she does not hear from her surgeons office in the next week she will contact them.  Advised to seek medical attention in the emergency department if she has worsening abdominal discomfort.

## 2023-02-03 DIAGNOSIS — Z860100 Personal history of colon polyps, unspecified: Secondary | ICD-10-CM | POA: Diagnosis not present

## 2023-02-09 DIAGNOSIS — H43813 Vitreous degeneration, bilateral: Secondary | ICD-10-CM | POA: Diagnosis not present

## 2023-02-09 DIAGNOSIS — H04123 Dry eye syndrome of bilateral lacrimal glands: Secondary | ICD-10-CM | POA: Diagnosis not present

## 2023-02-17 ENCOUNTER — Encounter: Payer: Self-pay | Admitting: Surgery

## 2023-03-04 ENCOUNTER — Ambulatory Visit: Payer: Medicare Other | Admitting: General Practice

## 2023-03-04 ENCOUNTER — Ambulatory Visit
Admission: RE | Admit: 2023-03-04 | Discharge: 2023-03-04 | Disposition: A | Discharge status: Home / Self Care | Payer: Medicare Other | Attending: Surgery | Admitting: Surgery

## 2023-03-04 ENCOUNTER — Other Ambulatory Visit: Payer: Self-pay

## 2023-03-04 ENCOUNTER — Encounter: Payer: Self-pay | Admitting: Surgery

## 2023-03-04 ENCOUNTER — Encounter
Admission: RE | Disposition: A | Discharge status: Home / Self Care | Payer: Self-pay | Source: Home / Self Care | Attending: Surgery

## 2023-03-04 DIAGNOSIS — Z860100 Personal history of colon polyps, unspecified: Secondary | ICD-10-CM | POA: Diagnosis not present

## 2023-03-04 DIAGNOSIS — Z96643 Presence of artificial hip joint, bilateral: Secondary | ICD-10-CM | POA: Diagnosis not present

## 2023-03-04 DIAGNOSIS — K640 First degree hemorrhoids: Secondary | ICD-10-CM | POA: Insufficient documentation

## 2023-03-04 DIAGNOSIS — M8580 Other specified disorders of bone density and structure, unspecified site: Secondary | ICD-10-CM | POA: Diagnosis not present

## 2023-03-04 DIAGNOSIS — K649 Unspecified hemorrhoids: Secondary | ICD-10-CM | POA: Diagnosis not present

## 2023-03-04 DIAGNOSIS — I10 Essential (primary) hypertension: Secondary | ICD-10-CM | POA: Diagnosis not present

## 2023-03-04 DIAGNOSIS — D123 Benign neoplasm of transverse colon: Secondary | ICD-10-CM | POA: Diagnosis not present

## 2023-03-04 DIAGNOSIS — Z9049 Acquired absence of other specified parts of digestive tract: Secondary | ICD-10-CM | POA: Diagnosis not present

## 2023-03-04 DIAGNOSIS — K5730 Diverticulosis of large intestine without perforation or abscess without bleeding: Secondary | ICD-10-CM | POA: Diagnosis not present

## 2023-03-04 DIAGNOSIS — K644 Residual hemorrhoidal skin tags: Secondary | ICD-10-CM | POA: Insufficient documentation

## 2023-03-04 DIAGNOSIS — K635 Polyp of colon: Secondary | ICD-10-CM | POA: Diagnosis not present

## 2023-03-04 DIAGNOSIS — Z79899 Other long term (current) drug therapy: Secondary | ICD-10-CM | POA: Insufficient documentation

## 2023-03-04 DIAGNOSIS — Z08 Encounter for follow-up examination after completed treatment for malignant neoplasm: Secondary | ICD-10-CM | POA: Diagnosis present

## 2023-03-04 HISTORY — PX: POLYPECTOMY: SHX5525

## 2023-03-04 HISTORY — PX: COLONOSCOPY WITH PROPOFOL: SHX5780

## 2023-03-04 SURGERY — COLONOSCOPY WITH PROPOFOL
Anesthesia: General

## 2023-03-04 MED ORDER — SODIUM CHLORIDE 0.9 % IV SOLN: INTRAVENOUS | Status: DC

## 2023-03-04 MED ORDER — PROPOFOL 10 MG/ML IV BOLUS
INTRAVENOUS | Status: AC
  Filled 2023-03-04: qty 20

## 2023-03-04 MED ORDER — LIDOCAINE HCL (PF) 2 % IJ SOLN
INTRAMUSCULAR | Status: AC
  Filled 2023-03-04: qty 5

## 2023-03-04 MED ORDER — PROPOFOL 10 MG/ML IV BOLUS
INTRAVENOUS | Status: DC | PRN
Start: 2023-03-04 — End: 2023-03-04
  Administered 2023-03-04: 80 mg via INTRAVENOUS
  Administered 2023-03-04: 100 ug/kg/min via INTRAVENOUS

## 2023-03-04 MED ORDER — PROPOFOL 1000 MG/100ML IV EMUL
INTRAVENOUS | Status: AC
  Filled 2023-03-04: qty 100

## 2023-03-04 MED ORDER — LIDOCAINE HCL (CARDIAC) PF 100 MG/5ML IV SOSY
PREFILLED_SYRINGE | INTRAVENOUS | Status: DC | PRN
  Administered 2023-03-04: 50 mg via INTRAVENOUS

## 2023-03-04 NOTE — Anesthesia Preprocedure Evaluation (Signed)
Anesthesia Evaluation  Patient identified by MRN, date of birth, ID band Patient awake    Reviewed: Allergy & Precautions, NPO status , Patient's Chart, lab work & pertinent test results  History of Anesthesia Complications (+) PONV and history of anesthetic complications  Airway Mallampati: II  TM Distance: >3 FB Neck ROM: Full    Dental  (+) Dental Advisory Given, Teeth Intact   Pulmonary neg pulmonary ROS   Pulmonary exam normal        Cardiovascular hypertension, Pt. 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  No date: Arthritis No date: Diverticulosis No date: Headache     Comment:  occasionally No date: Hypertension     Comment:  takes Lisinopril daily No date: Joint pain No date: Osteopenia No date: PONV (postoperative nausea and vomiting)     Comment:  Slight nausea No date: Rosacea  Past Surgical History: 07/02/2021: CATARACT EXTRACTION W/PHACO; Left     Comment:  Procedure: CATARACT EXTRACTION PHACO AND INTRAOCULAR               LENS PLACEMENT (IOC) LEFT EYHANCE 6.84 00:46.3;  Surgeon:              Galen Manila, MD;  Location: [facility];                Service: Ophthalmology;  Laterality: Left; 07/16/2021: CATARACT EXTRACTION W/PHACO; Right     Comment:  Procedure: CATARACT EXTRACTION PHACO AND INTRAOCULAR               LENS PLACEMENT (IOC) RIGHT EYHANCE 5.89 00:51.9;                Surgeon: Galen Manila, MD;  Location: [facility]              [facility];  Service: Ophthalmology;  Laterality: Right; No date: CHOLECYSTECTOMY No date: CHOLECYSTECTOMY No date: COLONOSCOPY 11/25/2019: COLONOSCOPY WITH PROPOFOL; N/A     Comment:  Procedure: COLONOSCOPY WITH PROPOFOL;  Surgeon:               Pasty Spillers, MD;  Location: [facility] ENDOSCOPY;                Service: Gastroenterology;  Laterality: N/A; 2013: EYE SURGERY     Comment:  LASIK No date: JOINT REPLACEMENT No date: REFRACTIVE SURGERY 09/19/2014: SHOULDER ARTHROSCOPY WITH OPEN ROTATOR CUFF REPAIR AND  DISTAL CLAVICLE ACROMINECTOMY; Right     Comment:  Procedure: SHOULDER ARTHROSCOPY WITH MINI-OPEN ROTATOR               CUFF REPAIR AND DISTAL CLAVICLE RESECTION, SUBACROMIAL               DECOMPRESSION;  Surgeon: Valeria Batman, MD;                Location: [facility] OR;  Service: Orthopedics;  Laterality:               Right; at age 40: TONSILLECTOMY 11/20/2020: TOTAL HIP ARTHROPLASTY; Left  Comment:  Procedure: LEFT TOTAL HIP ARTHROPLASTY ANTERIOR               APPROACH;  Surgeon: Kathryne Hitch, MD;                Location: [facility] OR;  Service: Orthopedics;  Laterality:               Left;  RNFA 04/08/2022: TOTAL HIP ARTHROPLASTY; Right     Comment:  Procedure: RIGHT TOTAL HIP ARTHROPLASTY;  Surgeon:               Kathryne Hitch, MD;  Location: [facility] OR;  Service:               Orthopedics;  Laterality: Right;  Needs RNFA 03/18/2016: TOTAL KNEE ARTHROPLASTY; Right 03/18/2016: TOTAL KNEE ARTHROPLASTY; Right     Comment:  Procedure: TOTAL KNEE ARTHROPLASTY;  Surgeon: Valeria Batman, MD;  Location: [facility] OR;  Service: Orthopedics;                Laterality: Right; at age 15: VAGINAL HYSTERECTOMY  BMI    Body Mass Index: 31.83 kg/m      Reproductive/Obstetrics negative OB ROS                             Anesthesia Physical Anesthesia Plan  ASA: 2  Anesthesia Plan: General   Post-op Pain  Management: Minimal or no pain anticipated   Induction: Intravenous  PONV Risk Score and Plan: 3 and Propofol infusion, TIVA and Ondansetron  Airway Management Planned: Nasal Cannula  Additional Equipment: None  Intra-op Plan:   Post-operative Plan:   Informed Consent: I have reviewed the patients History and Physical, chart, labs and discussed the procedure including the risks, benefits and alternatives for the proposed anesthesia with the patient or authorized representative who has indicated his/her understanding and acceptance.     Dental advisory given  Plan Discussed with: CRNA and Surgeon  Anesthesia Plan Comments: (Discussed risks of anesthesia with patient, including possibility of difficulty with spontaneous ventilation under anesthesia necessitating airway intervention, PONV, and rare risks such as cardiac or respiratory or neurological events, and allergic reactions. Discussed the role of CRNA in patient's perioperative care. Patient understands.)       Anesthesia Quick Evaluation

## 2023-03-04 NOTE — Op Note (Signed)
[facility] Gastroenterology Patient Name: Katherine Stanley Procedure Date: 03/04/2023 7:33 AM MRN: [id number] Account #: [id number] Date of Birth: 27-Apr-1945 Admit Type: Outpatient Age: 77 Room: [facility] ENDO ROOM 4 Gender: Female Note Status: Finalized Instrument Name: Prentice Docker [id number] Procedure:             Colonoscopy Indications:           High risk colon cancer surveillance: Personal history                         of colonic polyps Providers:             Sung Amabile MD, MD Referring MD:          Yehuda Mao. Birdie Sons (Referring MD) Medicines:             Propofol per Anesthesia Complications:         No immediate complications. Procedure:             Pre-Anesthesia Assessment:                        - After reviewing the risks and benefits, the patient                         was deemed in satisfactory condition to undergo the                         procedure in an ambulatory setting.                        After obtaining informed consent, the colonoscope was                         passed under direct vision. Throughout the procedure,                         the patient's blood pressure, pulse, and oxygen                         saturations were monitored continuously. The                         Colonoscope was introduced through the anus and                         advanced to the the cecum, identified by the ileocecal                         valve. The colonoscopy was performed without                         difficulty. The patient tolerated the procedure well.                         The quality of the bowel preparation was fair. Findings:      Skin tags were found on perianal exam.      Multiple medium-mouthed diverticula were found in the entire colon.      A 3 mm polyp was found in the descending colon. The polyp was       semi-pedunculated.  The polyp was removed with a cold snare. Resection       and retrieval were complete. Estimated blood loss  was minimal.      Non-bleeding internal hemorrhoids were found during retroflexion. The       hemorrhoids were Grade I (internal hemorrhoids that do not prolapse). Impression:            - Preparation of the colon was fair.                        - Perianal skin tags found on perianal exam.                        - Diverticulosis in the entire examined colon.                        - One 3 mm polyp in the descending colon, removed with                         a cold snare. Resected and retrieved.                        - Non-bleeding internal hemorrhoids. Recommendation:        - Await pathology results.                        - Written discharge instructions were provided to the                         patient.                        - Discharge patient to home.                        - Resume previous diet. Procedure Code(s):     --- Professional ---                        [phone], Colonoscopy, flexible; with removal of                         tumor(s), polyp(s), or other lesion(s) by snare                         technique Diagnosis Code(s):     --- Professional ---                        Z86.010, Personal history of colonic polyps                        K64.0, First degree hemorrhoids                        D12.4, Benign neoplasm of descending colon                        K64.4, Residual hemorrhoidal skin tags                        K57.30, Diverticulosis of large intestine without  perforation or abscess without bleeding CPT copyright 2022 American Medical Association. All rights reserved. The codes documented in this report are preliminary and upon coder review may  be revised to meet current compliance requirements. Dr. Harrie Foreman, MD Sung Amabile MD, MD 03/04/2023 8:01:17 AM This report has been signed electronically. Number of Addenda: 0 Note Initiated On: 03/04/2023 7:33 AM Scope Withdrawal Time: 0 hours 13 minutes 7 seconds  Total Procedure Duration:  0 hours 17 minutes 45 seconds  Estimated Blood Loss:  Estimated blood loss was minimal.      [facility]

## 2023-03-04 NOTE — Interval H&P Note (Signed)
History and Physical Interval Note:  03/04/2023 7:13 AM  Katherine Stanley  has presented today for surgery, with the diagnosis of history of colon polyps Z86.0100.  The various methods of treatment have been discussed with the patient and family. After consideration of risks, benefits and other options for treatment, the patient has consented to  Procedure(s): COLONOSCOPY WITH PROPOFOL (N/A) as a surgical intervention.  The patient's history has been reviewed, patient examined, no change in status, stable for surgery.  I have reviewed the patient's chart and labs.  Questions were answered to the patient's satisfaction.     Madysun Thall Tonna Boehringer

## 2023-03-04 NOTE — Transfer of Care (Signed)
Immediate Anesthesia Transfer of Care Note  Patient: Katherine Stanley  Procedure(s) Performed: COLONOSCOPY WITH PROPOFOL POLYPECTOMY  Patient Location: PACU and Endoscopy Unit  Anesthesia Type:General  Level of Consciousness: drowsy and patient cooperative  Airway & Oxygen Therapy: Patient Spontanous Breathing  Post-op Assessment: Report given to RN and Post -op Vital signs reviewed and stable  Post vital signs: Reviewed and stable  Last Vitals:  Vitals Value Taken Time  BP 100/75 03/04/23 0801  Temp 36.1 C 03/04/23 0759  Pulse 86 03/04/23 0803  Resp 27 03/04/23 0803  SpO2 96 % 03/04/23 0803  Vitals shown include unfiled device data.  Last Pain:  Vitals:   03/04/23 0759  TempSrc: Temporal  PainSc: Asleep         Complications: No notable events documented.

## 2023-03-04 NOTE — H&P (Signed)
Subjective:   CC: History of colon polyps [Z86.0100]  HPI: Katherine Stanley is a 77 y.o. female who returns for evaluation of above. Recent admission for perforated appendicitis. Plan is for interval appy, but due for colonoscopy next year, recommended we proceed this year to ensure no additional surgical intervention needed.  Asymptomatic since leaving hospital. No complaints   Past Medical History: has no past medical history on file.  Past Surgical History: has a past surgical history that includes rotator cuff surgery (2015); Replacement total knee (Right, 2015); hip replacement surgery (Right, 2023); hip replacement surgery (Left, 2022); and cataract surgery (Bilateral, 2023).  Family History: family history includes Diabetes in her father.  Social History: reports that she has never smoked. She has never used smokeless tobacco. No history on file for alcohol use and drug use.  Current Medications: has a current medication list which includes the following prescription(s): cholecalciferol, coenzyme q10-vitamin e, cyanocobalamin, ferrous gluconate, krill oil, lisinopril, lysine hcl, potassium gluconate, risedronate, and white petrolatum-mineral oil.  Allergies:  Allergies  Allergen Reactions  Alendronate Other (See Comments)  Diffuse myalgia and arthralgia  Naproxen Sodium Nausea And Vomiting  Sick to stomach   ROS:  A 15 point review of systems was performed and pertinent positives and negatives noted in HPI  Objective:    BP (!) 141/85  Pulse (!) 125  Ht 154.9 cm (5\' 1" )  Wt 74.8 kg (164 lb 12.8 oz)  BMI 31.14 kg/m   Constitutional : No distress, cooperative, alert  Lymphatics/Throat: Supple with no lymphadenopathy  Respiratory: Clear to auscultation bilaterally  Cardiovascular: Regular rate and rhythm  Gastrointestinal: Soft, non-tender, non-distended, no organomegaly.  Musculoskeletal: Steady gait and movement  Skin: Cool and moist  Psychiatric: Normal affect,  non-agitated, not confused     LABS:  N/a   RADS: N/a  Assessment:    History of colon polyps [Z86.0100]  Plan:    1. History of colon polyps [Z86.0100] R/b/a discussed. Risks include bleeding, perforation. Benefits include diagnostic, curative procedure if needed. Alternatives include continued observation. Pt verbalized understanding.  Will schedule  labs/images/medications/previous chart entries reviewed personally and relevant changes/updates noted above.

## 2023-03-04 NOTE — Anesthesia Postprocedure Evaluation (Signed)
Anesthesia Post Note  Patient: Katherine Stanley  Procedure(s) Performed: COLONOSCOPY WITH PROPOFOL POLYPECTOMY  Patient location during evaluation: PACU Anesthesia Type: General Level of consciousness: awake and alert Pain management: pain level controlled Vital Signs Assessment: post-procedure vital signs reviewed and stable Respiratory status: spontaneous breathing, nonlabored ventilation, respiratory function stable and patient connected to nasal cannula oxygen Cardiovascular status: blood pressure returned to baseline and stable Postop Assessment: no apparent nausea or vomiting Anesthetic complications: no  No notable events documented.   Last Vitals:  Vitals:   03/04/23 0809 03/04/23 0819  BP: 116/78 118/76  Pulse: 88 81  Resp: (!) 22   Temp:    SpO2: 97% 98%    Last Pain:  Vitals:   03/04/23 0819  TempSrc:   PainSc: 0-No pain                 Stephanie Coup

## 2023-03-05 ENCOUNTER — Encounter: Payer: Self-pay | Admitting: Surgery

## 2023-03-06 LAB — SURGICAL PATHOLOGY

## 2023-03-13 ENCOUNTER — Other Ambulatory Visit: Payer: Self-pay | Admitting: Family Medicine

## 2023-03-16 ENCOUNTER — Ambulatory Visit (INDEPENDENT_AMBULATORY_CARE_PROVIDER_SITE_OTHER): Payer: Medicare Other | Admitting: Family Medicine

## 2023-03-16 ENCOUNTER — Encounter: Payer: Self-pay | Admitting: Family Medicine

## 2023-03-16 VITALS — BP 117/64 | HR 103 | Temp 98.4°F | Ht 60.0 in | Wt 166.6 lb

## 2023-03-16 DIAGNOSIS — D75839 Thrombocytosis, unspecified: Secondary | ICD-10-CM | POA: Insufficient documentation

## 2023-03-16 DIAGNOSIS — I10 Essential (primary) hypertension: Secondary | ICD-10-CM

## 2023-03-16 DIAGNOSIS — M810 Age-related osteoporosis without current pathological fracture: Secondary | ICD-10-CM

## 2023-03-16 HISTORY — DX: Thrombocytosis, unspecified: D75.839

## 2023-03-16 LAB — CBC WITH DIFFERENTIAL/PLATELET
Basophils Absolute: 0.1 10*3/uL (ref 0.0–0.1)
Basophils Relative: 0.8 % (ref 0.0–3.0)
Eosinophils Absolute: 0.1 10*3/uL (ref 0.0–0.7)
Eosinophils Relative: 2 % (ref 0.0–5.0)
HCT: 42.8 % (ref 36.0–46.0)
Hemoglobin: 14 g/dL (ref 12.0–15.0)
Lymphocytes Relative: 35.6 % (ref 12.0–46.0)
Lymphs Abs: 2.5 10*3/uL (ref 0.7–4.0)
MCHC: 32.7 g/dL (ref 30.0–36.0)
MCV: 94.6 fL (ref 78.0–100.0)
Monocytes Absolute: 0.5 10*3/uL (ref 0.1–1.0)
Monocytes Relative: 7.6 % (ref 3.0–12.0)
Neutro Abs: 3.8 10*3/uL (ref 1.4–7.7)
Neutrophils Relative %: 54 % (ref 43.0–77.0)
Platelets: 282 10*3/uL (ref 150.0–400.0)
RBC: 4.52 Mil/uL (ref 3.87–5.11)
RDW: 13.1 % (ref 11.5–15.5)
WBC: 7.1 10*3/uL (ref 4.0–10.5)

## 2023-03-16 MED ORDER — RISEDRONATE SODIUM 35 MG PO TABS
35.0000 mg | ORAL_TABLET | ORAL | 1 refills | Status: DC
Start: 2023-03-16 — End: 2023-09-21

## 2023-03-16 NOTE — Progress Notes (Signed)
Marikay Alar, MD Phone: [phone]  Katherine Stanley is a 77 y.o. female who presents today for f/u.  HYPERTENSION Disease Monitoring Home BP Monitoring 110s-120s/60s, notes it was high today at home, thinks this may be related to dealing with her husband who just had surgery on his back Chest pain- no    Dyspnea- no Medications Compliance-  taking lisinopril.   Edema- no BMET    Component Value Date/Time   NA 145 01/23/2023 1133   K 3.8 01/23/2023 1133   CL 108 01/23/2023 1133   CO2 25 01/23/2023 1133   GLUCOSE 112 (H) 01/23/2023 1133   BUN 11 01/23/2023 1133   CREATININE 0.66 01/23/2023 1133   CALCIUM 9.5 01/23/2023 1133   GFRNONAA >60 01/18/2023 0329   GFRAA >60 03/20/2016 0432   Osteoporosis: Patient has done well on risedronate.  She notes no side effects.  No recent fractures.  She also takes calcium and vitamin D.  Social History   Tobacco Use  Smoking Status Never  Smokeless Tobacco Never    Current Outpatient Medications on File Prior to Visit  Medication Sig Dispense Refill   cholecalciferol (VITAMIN D) 1000 units tablet Take 1,000 Units by mouth daily.     Coenzyme Q10 (COQ-10) 100 MG CAPS Take 100 mg by mouth daily.     ferrous gluconate (FERGON) 240 (27 FE) MG tablet Take 240 mg by mouth daily.     Krill Oil 350 MG CAPS Take 350 mg by mouth daily.     lisinopril (ZESTRIL) 10 MG tablet Take 1/2 (one-half) tablet by mouth once daily Strength: 10 mg 45 tablet 2   Lysine 500 MG CAPS Take 500 mg by mouth daily as needed (fever blisters).     Multiple Vitamin (MULTIVITAMIN WITH MINERALS) TABS tablet Take 1 tablet by mouth daily.     Multiple Vitamins-Minerals (PRESERVISION AREDS 2) CAPS Take 1 capsule by mouth daily.     OVER THE COUNTER MEDICATION Take 1 tablet by mouth daily. Beet root supplement     Plant Sterols and Stanols (CHOLEST OFF PO) Take 1 tablet by mouth daily.     Potassium 99 MG TABS Take 99 mg by mouth daily.     vitamin B-12  (CYANOCOBALAMIN) 1000 MCG tablet Take 1,000 mcg by mouth daily.     White Petrolatum-Mineral Oil (RETAINE PM) OINT Place 1 Application into both eyes See admin instructions. Apply to both eyes every morning, may use again as needed for dryness     No current facility-administered medications on file prior to visit.     ROS see history of present illness  Objective  Physical Exam Vitals:   03/16/23 0910 03/16/23 0911  BP: (!) 140/82 117/64  Pulse:    Temp:    SpO2:      BP Readings from Last 3 Encounters:  03/16/23 117/64  03/04/23 118/76  01/23/23 (!) 148/90   Wt Readings from Last 3 Encounters:  03/16/23 166 lb 9.6 oz (75.6 kg)  03/04/23 163 lb (73.9 kg)  01/23/23 168 lb 12.8 oz (76.6 kg)    Physical Exam Constitutional:      General: She is not in acute distress.    Appearance: She is not diaphoretic.  Cardiovascular:     Rate and Rhythm: Normal rate and regular rhythm.     Heart sounds: Normal heart sounds.  Pulmonary:     Effort: Pulmonary effort is normal.     Breath sounds: Normal breath sounds.  Skin:  General: Skin is warm and dry.  Neurological:     Mental Status: She is alert.      Assessment/Plan: Please see individual problem list.  Primary hypertension Assessment & Plan: Chronic issue.  Elevated today though has been adequately controlled at home.  She will monitor at home and let us know if it starts to run greater than 130/80.  She will continue lisinopril 5 mg daily.  Home BP documented today.   Age-related osteoporosis without current pathological fracture Assessment & Plan: Chronic issue.  Patient will continue risedronate 35 mg daily.  She will also continue calcium and vitamin D supplementation.  Orders: -     Risedronate Sodium; Take 1 tablet (35 mg total) by mouth every 7 (seven) days. with water on empty stomach, nothing by mouth or lie down for next 30 minutes.  Dispense: 12 tablet; Refill: 1  Thrombocytosis Assessment &  Plan: Prior elevation of platelets in the setting of appendicitis.  Recheck CBC today to ensure that this has normalized.  Orders: -     CBC with Differential/Platelet    Return for As scheduled.   Marikay Alar, MD [facility]

## 2023-03-16 NOTE — Assessment & Plan Note (Signed)
Chronic issue.  Patient will continue risedronate 35 mg daily.  She will also continue calcium and vitamin D supplementation.

## 2023-03-16 NOTE — Assessment & Plan Note (Signed)
Prior elevation of platelets in the setting of appendicitis.  Recheck CBC today to ensure that this has normalized.

## 2023-03-16 NOTE — Assessment & Plan Note (Addendum)
Chronic issue.  Elevated today though has been adequately controlled at home.  She will monitor at home and let us know if it starts to run greater than 130/80.  She will continue lisinopril 5 mg daily.  Home BP documented today.

## 2023-04-06 DIAGNOSIS — Z8719 Personal history of other diseases of the digestive system: Secondary | ICD-10-CM | POA: Diagnosis not present

## 2023-04-17 ENCOUNTER — Encounter: Payer: Medicare Other | Admitting: Family Medicine

## 2023-07-02 ENCOUNTER — Telehealth: Payer: Self-pay | Admitting: Family Medicine

## 2023-07-02 NOTE — Telephone Encounter (Signed)
 Dr Clent Ridges is leaving the practice and your New Patient or Transfer of Care appointment needs to be rescheduled with another provider. Please call the office to schedule a Transfer of Care to either Dr Charlann Lange, Darleen Crocker or Kara Dies, NP.  E2C2 please rescdule appointment.

## 2023-07-07 ENCOUNTER — Other Ambulatory Visit: Payer: Self-pay

## 2023-07-07 DIAGNOSIS — I10 Essential (primary) hypertension: Secondary | ICD-10-CM

## 2023-07-07 MED ORDER — LISINOPRIL 10 MG PO TABS: ORAL_TABLET | ORAL | 1 refills | Status: DC

## 2023-07-13 ENCOUNTER — Encounter

## 2023-07-24 ENCOUNTER — Encounter: Payer: Medicare Other | Admitting: Family Medicine

## 2023-08-03 ENCOUNTER — Encounter: Payer: Self-pay | Admitting: Dermatology

## 2023-08-03 ENCOUNTER — Ambulatory Visit (INDEPENDENT_AMBULATORY_CARE_PROVIDER_SITE_OTHER): Payer: Medicare Other | Admitting: Dermatology

## 2023-08-03 DIAGNOSIS — L918 Other hypertrophic disorders of the skin: Secondary | ICD-10-CM | POA: Diagnosis not present

## 2023-08-03 DIAGNOSIS — Z1283 Encounter for screening for malignant neoplasm of skin: Secondary | ICD-10-CM | POA: Diagnosis not present

## 2023-08-03 DIAGNOSIS — L578 Other skin changes due to chronic exposure to nonionizing radiation: Secondary | ICD-10-CM | POA: Diagnosis not present

## 2023-08-03 DIAGNOSIS — Z872 Personal history of diseases of the skin and subcutaneous tissue: Secondary | ICD-10-CM

## 2023-08-03 DIAGNOSIS — L814 Other melanin hyperpigmentation: Secondary | ICD-10-CM

## 2023-08-03 DIAGNOSIS — D1801 Hemangioma of skin and subcutaneous tissue: Secondary | ICD-10-CM

## 2023-08-03 DIAGNOSIS — W908XXA Exposure to other nonionizing radiation, initial encounter: Secondary | ICD-10-CM

## 2023-08-03 DIAGNOSIS — L821 Other seborrheic keratosis: Secondary | ICD-10-CM | POA: Diagnosis not present

## 2023-08-03 DIAGNOSIS — D2239 Melanocytic nevi of other parts of face: Secondary | ICD-10-CM | POA: Diagnosis not present

## 2023-08-03 DIAGNOSIS — D2339 Other benign neoplasm of skin of other parts of face: Secondary | ICD-10-CM

## 2023-08-03 DIAGNOSIS — D229 Melanocytic nevi, unspecified: Secondary | ICD-10-CM

## 2023-08-03 NOTE — Progress Notes (Signed)
 Follow-Up Visit   Subjective  Katherine Stanley is a 78 y.o. female who presents for the following: Skin Cancer Screening and Full Body Skin Exam, hx of Aks, check spot L cheek, just noticed, check crusty spot back, no symptoms  The patient presents for Total-Body Skin Exam (TBSE) for skin cancer screening and mole check. The patient has spots, moles and lesions to be evaluated, some may be new or changing and the patient may have concern these could be cancer.    The following portions of the chart were reviewed this encounter and updated as appropriate: medications, allergies, medical history  Review of Systems:  No other skin or systemic complaints except as noted in HPI or Assessment and Plan.  Objective  Well appearing patient in no apparent distress; mood and affect are within normal limits.  A full examination was performed including scalp, head, eyes, ears, nose, lips, neck, chest, axillae, abdomen, back, buttocks, bilateral upper extremities, bilateral lower extremities, hands, feet, fingers, toes, fingernails, and toenails. All findings within normal limits unless otherwise noted below.   Relevant physical exam findings are noted in the Assessment and Plan.    Assessment & Plan   SKIN CANCER SCREENING PERFORMED TODAY.  ACTINIC DAMAGE - Chronic condition, secondary to cumulative UV/sun exposure - diffuse scaly erythematous macules with underlying dyspigmentation - Recommend daily broad spectrum sunscreen SPF 30+ to sun-exposed areas, reapply every 2 hours as needed.  - Staying in the shade or wearing long sleeves, sun glasses (UVA+UVB protection) and wide brim hats (4-inch brim around the entire circumference of the hat) are also recommended for sun protection.  - Call for new or changing lesions.  LENTIGINES, SEBORRHEIC KERATOSES, HEMANGIOMAS - Benign normal skin lesions - R low back SK, waxy stuck on papule - R lower eyelid 3.52mm SK - L cheek lentigo tan macule -  Benign-appearing - Call for any changes  MELANOCYTIC NEVI - Tan-brown and/or pink-flesh-colored symmetric macules and papules - R Malar cheek 6.31mm pink flesh pap - stable - Benign appearing on exam today - Observation - Call clinic for new or changing moles - Recommend daily use of broad spectrum spf 30+ sunscreen to sun-exposed areas.    Acrochordons (Skin Tags) neck - Fleshy, skin-colored pedunculated papules - Benign appearing.  - Observe. - If desired, they can be removed with an in office procedure that is not covered by insurance. - Please call the clinic if you notice any new or changing lesions.   ANGIOFIBROMA/FIBROUS PAPULE R nasal dorsum Exam: 2.33mm blanching pink firm papule R nasal dorsum  Treatment Plan: Benign-appearing.  Observation.  Call clinic for new or changing moles.  Recommend daily use of broad spectrum spf 30+ sunscreen to sun-exposed areas.    HISTORY OF PRECANCEROUS ACTINIC KERATOSIS - site(s) of PreCancerous Actinic Keratosis clear today. - these may recur and new lesions may form requiring treatment to prevent transformation into skin cancer - observe for new or changing spots and contact [facility] for appointment if occur - photoprotection with sun protective clothing; sunglasses and broad spectrum sunscreen with SPF of at least 30 + and frequent self skin exams recommended - yearly exams by a dermatologist recommended for persons with history of PreCancerous Actinic Keratoses    Return in about 1 year (around 08/02/2024) for TBSE, Hx of AKs.  I, Rollie Clipper, RMA, am acting as scribe for Artemio Larry, MD .   Documentation: I have reviewed the above documentation for accuracy and completeness, and I  agree with the above.  Artemio Larry, MD

## 2023-08-03 NOTE — Patient Instructions (Signed)

## 2023-09-16 ENCOUNTER — Telehealth: Payer: Self-pay | Admitting: Family Medicine

## 2023-09-16 NOTE — Telephone Encounter (Signed)
 Copied from CRM [id number]. Topic: Clinical - Medication Refill >> Sep 16, 2023 10:54 AM Chuck Crater wrote: Medication: risedronate  (ACTONEL ) 35 MG tablet   Has the patient contacted their pharmacy? Yes (Agent: If no, request that the patient contact the pharmacy for the refill. If patient does not wish to contact the pharmacy document the reason why and proceed with request.) (Agent: If yes, when and what did the pharmacy advise?) stated that request was sent twice   This is the patient's preferred pharmacy:  [facility] [address], [place] - [address] [place] [address] Phone: [phone] Fax: [phone]  Is this the correct pharmacy for this prescription? Yes If no, delete pharmacy and type the correct one.   Has the prescription been filled recently? No  Is the patient out of the medication? Yes  Has the patient been seen for an appointment in the last year OR does the patient have an upcoming appointment? Yes  Can we respond through MyChart? No  Agent: Please be advised that Rx refills may take up to 3 business days. We ask that you follow-up with your pharmacy.

## 2023-09-16 NOTE — Telephone Encounter (Signed)
 Nurse attempted to call pt regarding medication refill request received for Medication: risedronate  (ACTONEL ) 35 MG tablet from CRM #[id number] on 09/16/2023.  Nurse wanted to inform pt that the refill can not be processed at this time due to pt not being established at present time.  New pt appt scheduled on 10/12/23.  Called pt: no answer: left voicemail for patient to return call.

## 2023-09-16 NOTE — Telephone Encounter (Signed)
 E2C2 called to let office know that patient called and made an appointment with Dr Sharia Daunt on 09/21/2023, for medication refill. She is out of mediatation.

## 2023-09-21 ENCOUNTER — Ambulatory Visit (INDEPENDENT_AMBULATORY_CARE_PROVIDER_SITE_OTHER): Admitting: Internal Medicine

## 2023-09-21 ENCOUNTER — Encounter: Payer: Self-pay | Admitting: Internal Medicine

## 2023-09-21 VITALS — BP 136/80 | HR 107 | Temp 98.0°F | Ht 60.0 in | Wt 169.6 lb

## 2023-09-21 DIAGNOSIS — I10 Essential (primary) hypertension: Secondary | ICD-10-CM | POA: Diagnosis not present

## 2023-09-21 DIAGNOSIS — R7303 Prediabetes: Secondary | ICD-10-CM

## 2023-09-21 DIAGNOSIS — D75839 Thrombocytosis, unspecified: Secondary | ICD-10-CM | POA: Diagnosis not present

## 2023-09-21 DIAGNOSIS — M810 Age-related osteoporosis without current pathological fracture: Secondary | ICD-10-CM | POA: Diagnosis not present

## 2023-09-21 LAB — BASIC METABOLIC PANEL WITH GFR
BUN: 18 mg/dL (ref 6–23)
CO2: 22 meq/L (ref 19–32)
Calcium: 9.8 mg/dL (ref 8.4–10.5)
Chloride: 107 meq/L (ref 96–112)
Creatinine, Ser: 0.57 mg/dL (ref 0.40–1.20)
GFR: 88.14 mL/min (ref >60.00)
Glucose, Bld: 103 mg/dL — ABNORMAL HIGH (ref 70–99)
Potassium: 3.9 meq/L (ref 3.5–5.1)
Sodium: 138 meq/L (ref 135–145)

## 2023-09-21 LAB — HEMOGLOBIN A1C: Hgb A1c MFr Bld: 6 % (ref 4.6–6.5)

## 2023-09-21 MED ORDER — RISEDRONATE SODIUM 35 MG PO TABS
35.0000 mg | ORAL_TABLET | ORAL | 1 refills | Status: DC
Start: 2023-09-21 — End: 2023-10-02

## 2023-09-21 NOTE — Assessment & Plan Note (Signed)
-   This problem is chronic and stable -Patient is on risedronate  35 mg weekly -Denies any complaints on this medication -Will refill this for her today -No further workup at this time

## 2023-09-21 NOTE — Assessment & Plan Note (Signed)
-   This problem is chronic and stable -Patient's initial blood pressure was elevated at 142/86.  Repeat blood pressure was 136/80 -Patient is only on lisinopril  5 mg daily -She does check her blood pressure at home and notes systolics in the 120s to 130s and diastolics in the 70s -Will continue with current regimen for now -Will check BMP today -No further workup at this time

## 2023-09-21 NOTE — Patient Instructions (Signed)
-   It was a pleasure meeting you today -The blood pressure is mildly elevated here likely secondary to going to the gym prior to coming here.  We will recheck this prior to you leaving -We will continue with lisinopril  for your blood pressure control without any change to the dose currently -We will check an A1c today to follow-up on your prediabetes -We will check a BMP today to check your kidney function -Will put in a refill of risedronate  for you for your osteoporosis -Please contact us  with any questions or concerns

## 2023-09-21 NOTE — Assessment & Plan Note (Signed)
-   This problem is chronic and stable -Patient states that she does check her blood sugars at home on her husband's machine and notes her blood sugars to be in the 100s (108, 110 etc.) -Her last A1c was 5.9 in May of last year -Will recheck in A1c today -No further workup at this time

## 2023-09-21 NOTE — Progress Notes (Signed)
 Acute Office Visit  Subjective:     Patient ID: Katherine Stanley, female    DOB: October 28, 1945, 78 y.o.   MRN: [id number]  Chief Complaint  Patient presents with   Medication Refill    Medication Refill   Patient is in today for follow-up of her hypertension and osteoporosis.    Patient blood pressure today is mildly elevated at 142/86.  She does check her blood pressure every day at home with systolics ranging in the 120s to 130s and diastolic in 70s.  Patient does have a history of osteoporosis and has been on risedronate  for this.  She denies any complaints at this time.  She does have history of prediabetes with her last A1c being done last year.  It was mildly elevated at 5.9.  She states that she does check her blood sugars at home with her husband's machine and that her blood sugars usually run in the 100s (108, 110 etc.)  Review of Systems  Constitutional: Negative.   Respiratory: Negative.    Cardiovascular: Negative.   Musculoskeletal: Negative.   Neurological: Negative.   Psychiatric/Behavioral: Negative.          Objective:    BP 136/80   Pulse (!) 107   Temp 98 F (36.7 C)   Ht 5' (1.524 m)   Wt 169 lb 9.6 oz (76.9 kg)   SpO2 98%   BMI 33.12 kg/m    Physical Exam Constitutional:      Appearance: Normal appearance.  HENT:     Head: Normocephalic and atraumatic.  Cardiovascular:     Rate and Rhythm: Normal rate and regular rhythm.     Heart sounds: Normal heart sounds.  Pulmonary:     Effort: No respiratory distress.     Breath sounds: Normal breath sounds. No wheezing or rales.  Musculoskeletal:        General: No swelling or tenderness.  Neurological:     General: No focal deficit present.     Mental Status: She is alert.  Psychiatric:        Mood and Affect: Mood normal.        Behavior: Behavior normal.     No results found for any visits on 09/21/23.      Assessment & Plan:   Problem List Items Addressed This Visit        Cardiovascular and Mediastinum   Hypertension - Primary (Chronic)   - This problem is chronic and stable -Patient's initial blood pressure was elevated at 142/86.  Repeat blood pressure was 136/80 -Patient is only on lisinopril  5 mg daily -She does check her blood pressure at home and notes systolics in the 120s to 130s and diastolics in the 70s -Will continue with current regimen for now -Will check BMP today -No further workup at this time      Relevant Orders   Basic metabolic panel with GFR     Musculoskeletal and Integument   Osteoporosis   - This problem is chronic and stable -Patient is on risedronate  35 mg weekly -Denies any complaints on this medication -Will refill this for her today -No further workup at this time      Relevant Medications   risedronate  (ACTONEL ) 35 MG tablet     Hematopoietic and Hemostatic   RESOLVED: Thrombocytosis   - Patient was noted to have thrombocytosis in the setting of appendicitis. -This was rechecked in December with normalization of her platelets -No further workup at this time  Other   Prediabetes (Chronic)   - This problem is chronic and stable -Patient states that she does check her blood sugars at home on her husband's machine and notes her blood sugars to be in the 100s (108, 110 etc.) -Her last A1c was 5.9 in May of last year -Will recheck in A1c today -No further workup at this time      Relevant Orders   Hemoglobin A1c    Meds ordered this encounter  Medications   risedronate  (ACTONEL ) 35 MG tablet    Sig: Take 1 tablet (35 mg total) by mouth every 7 (seven) days. with water on empty stomach, nothing by mouth or lie down for next 30 minutes.    Dispense:  12 tablet    Refill:  1    No follow-ups on file.  Chauncey Sciulli, MD

## 2023-09-21 NOTE — Assessment & Plan Note (Signed)
-   Patient was noted to have thrombocytosis in the setting of appendicitis. -This was rechecked in December with normalization of her platelets -No further workup at this time

## 2023-09-23 ENCOUNTER — Ambulatory Visit: Payer: Self-pay | Admitting: Internal Medicine

## 2023-10-05 ENCOUNTER — Ambulatory Visit (INDEPENDENT_AMBULATORY_CARE_PROVIDER_SITE_OTHER)

## 2023-10-05 VITALS — BP 130/85 | HR 98 | Temp 98.8°F | Ht 60.25 in | Wt 168.8 lb

## 2023-10-05 DIAGNOSIS — M810 Age-related osteoporosis without current pathological fracture: Secondary | ICD-10-CM | POA: Diagnosis not present

## 2023-10-05 DIAGNOSIS — I10 Essential (primary) hypertension: Secondary | ICD-10-CM | POA: Diagnosis not present

## 2023-10-05 DIAGNOSIS — Z6832 Body mass index (BMI) 32.0-32.9, adult: Secondary | ICD-10-CM | POA: Diagnosis not present

## 2023-10-05 DIAGNOSIS — E66811 Obesity, class 1: Secondary | ICD-10-CM

## 2023-10-05 DIAGNOSIS — R7303 Prediabetes: Secondary | ICD-10-CM | POA: Diagnosis not present

## 2023-10-05 DIAGNOSIS — Z13220 Encounter for screening for lipoid disorders: Secondary | ICD-10-CM

## 2023-10-05 MED ORDER — RISEDRONATE SODIUM 35 MG PO TABS: 35.0000 mg | ORAL_TABLET | ORAL | 3 refills | Status: DC

## 2023-10-05 MED ORDER — LISINOPRIL 10 MG PO TABS: ORAL_TABLET | ORAL | 2 refills | Status: AC

## 2023-10-05 NOTE — Progress Notes (Signed)
 Established Patient Office Visit TOC from Dr. Maribeth    Subjective  Patient ID: Katherine Stanley, female    DOB: April 10, 1946  Age: 78 y.o. 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  HPI - Hypertension:  Uses [employer] for BP monitor and home average BP: 127/73 mmHg. No new edema, chest pain.  She plays golf twice a week, is working with Systems analyst as well.  She takes Lisinopril  5 mg (1/2 of 10 mg tablet).   - Osteoporosis: Risedronate  35 mg once a week. Takes with tall glass of water and does not lay down after taking this. Tolerating it okay. Takes Vitamin D  1000 units once a day. Last DEXA 11/03/2022 (T-score -2.9).  - She takes Coq 100 mg daily. She takes daily 240 mg Ferrous Gluconate , Potassium 99 mg daily, vitamin B 12 1000 mcg daily, Cholest off.   - Prediabetes: Last A1c on 09/21/23 was 6.0%. She does not eat high carbohydrate food.   ROS As per HPI    Objective:     BP 130/85 (BP Location: Left Arm)   Pulse 98   Temp 98.8 F (37.1 C) (Oral)   Ht 5' 0.25 (1.53 m)   Wt 168 lb 12.8 oz (76.6 kg)   SpO2 95%   BMI 32.69 kg/m      10/05/2023    9:03 AM 09/21/2023   10:59 AM 03/16/2023    9:03 AM  Depression screen PHQ 2/9  Decreased Interest 0 0 0  Down, Depressed, Hopeless 0 0 0  PHQ - 2 Score 0 0 0  Altered sleeping 0 0 0  Tired, decreased energy 0 0 0  Change in appetite 0 0 0  Feeling bad or failure about yourself  0 0 0  Trouble concentrating 0 0 0  Moving slowly or fidgety/restless 0 0 0  Suicidal thoughts 0 0 0  PHQ-9 Score 0 0 0  Difficult doing work/chores Not difficult at all Not difficult at all Not difficult at all       10/05/2023    9:03 AM 09/21/2023   10:59 AM 03/16/2023    9:03 AM 01/23/2023   11:25 AM  GAD 7 : Generalized Anxiety Score  Nervous, Anxious, on Edge 0 0 0 0  Control/stop worrying 0 0 0 0  Worry too much - different things 0 0 0 0  Trouble relaxing 0 0 0 0  Restless 0 0 0 0  Easily annoyed or irritable 0 0 0 0  Afraid - awful might happen 0 0 0 0  Total GAD 7 Score 0 0 0 0  Anxiety Difficulty Not difficult at all Not difficult at all Not difficult at all Not difficult at all      10/05/2023    9:03 AM 09/21/2023   10:59 AM 03/16/2023    9:03 AM  Depression screen PHQ 2/9  Decreased Interest 0 0 0  Down, Depressed, Hopeless 0 0 0  PHQ - 2 Score 0 0 0  Altered sleeping 0 0 0  Tired, decreased energy 0 0 0  Change in appetite 0 0 0  Feeling bad or failure about yourself  0 0 0  Trouble concentrating 0 0 0  Moving slowly  or fidgety/restless 0 0 0  Suicidal thoughts 0 0 0  PHQ-9 Score 0 0 0  Difficult doing work/chores Not difficult at all Not difficult at all Not difficult at all      10/05/2023    9:03 AM 09/21/2023   10:59 AM 03/16/2023    9:03 AM 01/23/2023   11:25 AM  GAD 7 : Generalized Anxiety Score  Nervous, Anxious, on Edge 0 0 0 0  Control/stop worrying 0 0 0 0  Worry too much - different things 0 0 0 0  Trouble relaxing 0 0 0 0  Restless 0 0 0 0  Easily annoyed or irritable 0 0 0 0  Afraid - awful might happen 0 0 0 0  Total GAD 7 Score 0 0 0 0  Anxiety Difficulty Not difficult at all Not difficult at all Not difficult at all Not difficult at all   SDOH Screenings   Food Insecurity: No Food Insecurity (09/30/2023)  Housing: Unknown (09/30/2023)  Transportation Needs: No Transportation Needs (09/30/2023)  Utilities: Not At Risk (01/13/2023)  Alcohol  Screen: Low Risk  (10/10/2022)  Depression (PHQ2-9): Low Risk  (10/05/2023)  Financial Resource Strain: Low Risk  (09/30/2023)  Physical Activity: Sufficiently Active (09/30/2023)  Social Connections: Socially Integrated  (09/30/2023)  Stress: No Stress Concern Present (09/30/2023)  Tobacco Use: Low Risk  (10/05/2023)     Physical Exam Constitutional:      General: She is not in acute distress.    Appearance: Normal appearance.  HENT:     Head: Normocephalic and atraumatic.     Right Ear: Tympanic membrane normal.     Left Ear: Tympanic membrane normal.     Mouth/Throat:     Mouth: Mucous membranes are moist.  Neck:     Thyroid : No thyroid  mass or thyroid  tenderness.   Cardiovascular:     Rate and Rhythm: Normal rate and regular rhythm.  Pulmonary:     Effort: Pulmonary effort is normal.     Breath sounds: Normal breath sounds. No wheezing.  Abdominal:     General: Bowel sounds are normal.     Palpations: Abdomen is soft.     Tenderness: There is no abdominal tenderness. There is no guarding.   Musculoskeletal:     Cervical back: Neck supple. No rigidity.     Right lower leg: No edema.     Left lower leg: No edema.  Lymphadenopathy:     Cervical: No cervical adenopathy.   Skin:    General: Skin is warm.   Neurological:     Mental Status: She is alert and oriented to person, place, and time.     Gait: Gait is intact.   Psychiatric:        Mood and Affect: Mood normal.        Behavior: Behavior normal. Behavior is cooperative.       No results found for any visits on 10/05/23.  The 10-year ASCVD risk score (Arnett DK, et al., 2019) is: 23.3%     Assessment & Plan:   Primary hypertension Assessment & Plan: Reviewed home BP reading, within goal. BP on arrival was slightly above goal but repeat BP is 130/85 mmHg, given patient's home BP readings/age continue Lisinopril  5 mg (1/2 tab of 10 mg once a day). Refill sent. Reviewed recent BMP result. Check CMP in 6 months. Continue home BP check.   Orders: -     Lisinopril ; Take 1/2 (one-half) tablet by mouth once daily Strength: 10 mg  Dispense: 45 tablet; Refill: 2 -     Comprehensive metabolic panel with GFR; Future  Age-related  osteoporosis without current pathological fracture Assessment & Plan: Reviewed DEXA from 11/03/22, continue Risedronate  35 mg once a week. Patient tolerating this without side effects. Continue Vitamin D  1000 units daily. Check vitamin D  in 6 months, lab ordered.   Orders: -     Risedronate  Sodium; Take 1 tablet (35 mg total) by mouth every 7 (seven) days. with water on empty stomach, nothing by mouth or lie down for next 30 minutes.  Dispense: 12 tablet; Refill: 3 -     VITAMIN D  25 Hydroxy (Vit-D Deficiency, Fractures); Future  Prediabetes Assessment & Plan: Reviewed A1c from 09/21/23.  Counseled on continuing lifestyle modifications with diet, exercise. Repeat in 6 months.   Orders: -     Vitamin B12; Future -     Hemoglobin A1c; Future  Screening for lipoid disorders Assessment & Plan: Check fasting lipid panel in 6 months. Lab ordered.   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 Continue. Eats balanced diet. On Vitamin B12 supplement, check B 12 level during next lab.   Orders: -     Vitamin B12; Future    Return in about 6 months (around 04/05/2024) for 6 months office visit and labs 2 days before office visit.   Luke Shade, MD

## 2023-10-05 NOTE — Assessment & Plan Note (Addendum)
 Patient is active, participates in regular cardiac and strengthening exercises. Continue. Eats balanced diet. On Vitamin B12 supplement, check B 12 level during next lab.

## 2023-10-05 NOTE — Assessment & Plan Note (Signed)
 Check fasting lipid panel in 6 months. Lab ordered.

## 2023-10-05 NOTE — Assessment & Plan Note (Signed)
 Reviewed A1c from 09/21/23.  Counseled on continuing lifestyle modifications with diet, exercise. Repeat in 6 months.

## 2023-10-05 NOTE — Assessment & Plan Note (Signed)
 Reviewed DEXA from 11/03/22, continue Risedronate  35 mg once a week. Patient tolerating this without side effects. Continue Vitamin D  1000 units daily. Check vitamin D  in 6 months, lab ordered.

## 2023-10-05 NOTE — Assessment & Plan Note (Signed)
 Reviewed home BP reading, within goal. BP on arrival was slightly above goal but repeat BP is 130/85 mmHg, given patient's home BP readings/age continue Lisinopril  5 mg (1/2 tab of 10 mg once a day). Refill sent. Reviewed recent BMP result. Check CMP in 6 months. Continue home BP check.

## 2023-10-12 ENCOUNTER — Ambulatory Visit (INDEPENDENT_AMBULATORY_CARE_PROVIDER_SITE_OTHER): Payer: Medicare Other | Admitting: *Deleted

## 2023-10-12 VITALS — BP 132/78 | Ht 60.03 in | Wt 165.0 lb

## 2023-10-12 DIAGNOSIS — Z0000 Encounter for general adult medical examination without abnormal findings: Secondary | ICD-10-CM | POA: Diagnosis not present

## 2023-10-12 NOTE — Patient Instructions (Signed)
 Katherine Stanley , Thank you for taking time out of your busy schedule to complete your Annual Wellness Visit with me. I enjoyed our conversation and look forward to speaking with you again next year. I, as well as your care team,  appreciate your ongoing commitment to your health goals. Please review the following plan we discussed and let me know if I can assist you in the future. Your Game plan/ To Do List    Referrals: If you haven't heard from the office you've been referred to, please reach out to them at the phone provided.  Remember to update your tetanus (Tdap) vaccine Follow up Visits: Next Medicare AWV with our clinical staff: 10/17/24 @ 8:50   Have you seen your provider in the last 6 months (3 months if uncontrolled diabetes)? Yes Next Office Visit with your provider: 04/11/24  Clinician Recommendations:  Aim for 30 minutes of exercise or brisk walking, 6-8 glasses of water, and 5 servings of fruits and vegetables each day.       This is a list of the screening recommended for you and due dates:  Health Maintenance  Topic Date Due   DTaP/Tdap/Td vaccine (2 - Tdap) 07/15/2020   COVID-19 Vaccine (5 - 2024-25 season) 12/14/2022   Flu Shot  11/13/2023   Mammogram  01/02/2024   Medicare Annual Wellness Visit  10/11/2024   DEXA scan (bone density measurement)  11/02/2024   Colon Cancer Screening  03/03/2028   Pneumococcal Vaccine for age over 73  Completed   Hepatitis C Screening  Completed   Hepatitis B Vaccine  Aged Out   HPV Vaccine  Aged Out   Meningitis B Vaccine  Aged Out   Zoster (Shingles) Vaccine  Discontinued    Advanced directives: (Copy Requested) Please bring a copy of your health care power of attorney and living will to the office to be added to your chart at your convenience. You can mail to [facility] [address] [place], [place] [address] or email to [email or web address] Advance Care Planning is important because it:  [x]  Makes sure  you receive the medical care that is consistent with your values, goals, and preferences  [x]  It provides guidance to your family and loved ones and reduces their decisional burden about whether or not they are making the right decisions based on your wishes.

## 2023-10-12 NOTE — Progress Notes (Signed)
 Subjective:   Katherine Stanley is a 78 y.o. who presents for a Medicare Wellness preventive visit.  As a reminder, Annual Wellness Visits don't include a physical exam, and some assessments may be limited, especially if this visit is performed virtually. We may recommend an in-person follow-up visit with your provider if needed.  Visit Complete: Virtual I connected with  Katherine Stanley on 10/12/23 by a audio enabled telemedicine application and verified that I am speaking with the correct person using two identifiers.  Patient Location: Home  Provider Location: Home Office  I discussed the limitations of evaluation and management by telemedicine. The patient expressed understanding and agreed to proceed.  Vital Signs: Because this visit was a virtual/telehealth visit, some criteria may be missing or patient reported. Any vitals not documented were not able to be obtained and vitals that have been documented are patient reported.  VideoDeclined- This patient declined Librarian, academic. Therefore the visit was completed with audio only.  Persons Participating in Visit: Patient.  AWV Questionnaire: Yes: Patient Medicare AWV questionnaire was completed by the patient on 10/09/23; I have confirmed that all information answered by patient is correct and no changes since this date.  Cardiac Risk Factors include: advanced age (>77men, >81 women);hypertension;obesity (BMI >30kg/m2)     Objective:    Today's Vitals   10/12/23 0851  BP: 132/78  Weight: 165 lb (74.8 kg)  Height: 5' 0.03 (1.525 m)   Body mass index is 32.2 kg/m.     10/12/2023    9:01 AM 03/04/2023    6:59 AM 01/13/2023    3:54 AM 10/10/2022    8:39 AM 04/08/2022    6:23 AM 04/01/2022    8:54 AM 09/20/2021    8:40 AM  Advanced Directives  Does Patient Have a Medical Advance Directive? Yes Yes No Yes No Yes Yes  Type of Estate agent of [place];Living will Healthcare  Power of [place];Living will  Healthcare Power of [place];Living will  Healthcare Power of [place];Living will Healthcare Power of [place];Living will  Does patient want to make changes to medical advance directive?       No - Patient declined  Copy of Healthcare Power of Attorney in Chart? No - copy requested Yes - validated most recent copy scanned in chart (See row information)  No - copy requested   No - copy requested  Would patient like information on creating a medical advance directive?   No - Patient declined        Current Medications (verified) Outpatient Encounter Medications as of 10/12/2023  Medication Sig   cholecalciferol  (VITAMIN D ) 1000 units tablet Take 1,000 Units by mouth daily.   Coenzyme Q10 (COQ-10) 100 MG CAPS Take 100 mg by mouth daily.   ferrous gluconate  (FERGON) 240 (27 FE) MG tablet Take 240 mg by mouth daily.   Krill Oil 350 MG CAPS Take 350 mg by mouth daily.   lisinopril  (ZESTRIL ) 10 MG tablet Take 1/2 (one-half) tablet by mouth once daily Strength: 10 mg   Lysine  500 MG CAPS Take 500 mg by mouth daily as needed (fever blisters).   Multiple Vitamin (MULTIVITAMIN WITH MINERALS) TABS tablet Take 1 tablet by mouth daily.   Multiple Vitamins-Minerals (PRESERVISION AREDS 2) CAPS Take 1 capsule by mouth daily.   OVER THE COUNTER MEDICATION Take 1 tablet by mouth daily. Beet root supplement   Plant Sterols and Stanols (CHOLEST OFF PO) Take 1 tablet by mouth daily.  Potassium 99 MG TABS Take 99 mg by mouth daily.   risedronate  (ACTONEL ) 35 MG tablet Take 1 tablet (35 mg total) by mouth every 7 (seven) days. with water on empty stomach, nothing by mouth or lie down for next 30 minutes.   vitamin B-12 (CYANOCOBALAMIN ) 1000 MCG tablet Take 1,000 mcg by mouth daily.   White Petrolatum -Mineral Oil (RETAINE PM) OINT Place 1 Application into both eyes See admin instructions. Apply to both eyes every morning, may use again as needed for dryness   No facility-administered  encounter medications on file as of 10/12/2023.    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    Actinic keratosis    Acute appendicitis with localized peritonitis 01/13/2023   Arthritis    Diverticulosis    Headache    occasionally   Hypertension    takes Lisinopril  daily   Joint pain    Osteopenia    PONV (postoperative nausea and vomiting)    Slight nausea   Rosacea    Past Surgical History:  Procedure Laterality Date   CATARACT EXTRACTION W/PHACO Left 07/02/2021   Procedure: CATARACT EXTRACTION PHACO AND INTRAOCULAR LENS PLACEMENT (IOC) LEFT EYHANCE 6.84 00:46.3;  Surgeon: Jaye Fallow, MD;  Location: [facility];  Service: Ophthalmology;  Laterality: Left;   CATARACT EXTRACTION W/PHACO Right 07/16/2021   Procedure: CATARACT EXTRACTION PHACO AND INTRAOCULAR LENS PLACEMENT (IOC) RIGHT EYHANCE 5.89 00:51.9;  Surgeon: Jaye Fallow, MD;  Location: [facility];  Service: Ophthalmology;  Laterality: Right;   CHOLECYSTECTOMY     CHOLECYSTECTOMY     COLONOSCOPY     COLONOSCOPY WITH PROPOFOL  N/A 11/25/2019   Procedure: COLONOSCOPY WITH PROPOFOL ;  Surgeon: Janalyn Keene NOVAK, MD;  Location: [facility];  Service: Gastroenterology;  Laterality: N/A;   COLONOSCOPY WITH PROPOFOL  N/A 03/04/2023   Procedure: COLONOSCOPY WITH PROPOFOL ;  Surgeon: Tye Millet, DO;  Location: [facility];  Service: General;  Laterality: N/A;   EYE SURGERY  2013   LASIK   JOINT REPLACEMENT     POLYPECTOMY  03/04/2023   Procedure: POLYPECTOMY;  Surgeon: Tye Millet, DO;  Location: [facility];  Service: General;;   REFRACTIVE SURGERY     SHOULDER ARTHROSCOPY WITH OPEN ROTATOR CUFF REPAIR AND DISTAL CLAVICLE ACROMINECTOMY Right 09/19/2014   Procedure: SHOULDER ARTHROSCOPY WITH MINI-OPEN ROTATOR CUFF REPAIR AND DISTAL CLAVICLE RESECTION,  SUBACROMIAL DECOMPRESSION;  Surgeon: Maude LELON Right, MD;  Location: [facility];  Service: Orthopedics;  Laterality: Right;   TONSILLECTOMY  at age 26   TOTAL HIP ARTHROPLASTY Left 11/20/2020   Procedure: LEFT TOTAL HIP ARTHROPLASTY ANTERIOR APPROACH;  Surgeon: Vernetta Lonni GRADE, MD;  Location: [facility];  Service: Orthopedics;  Laterality: Left;  RNFA   TOTAL HIP ARTHROPLASTY Right 04/08/2022   Procedure: RIGHT TOTAL HIP ARTHROPLASTY;  Surgeon: Vernetta Lonni GRADE, MD;  Location: [facility];  Service: Orthopedics;  Laterality: Right;  Needs RNFA   TOTAL KNEE ARTHROPLASTY Right 03/18/2016   TOTAL KNEE ARTHROPLASTY Right 03/18/2016   Procedure: TOTAL KNEE ARTHROPLASTY;  Surgeon: Maude LELON Right, MD;  Location: [facility];  Service: Orthopedics;  Laterality: Right;   VAGINAL HYSTERECTOMY  at age 49   Family History  Problem Relation Age of Onset   Hypertension Mother    Heart disease Mother    Kidney disease Mother    Diabetes Father    Hypertension Father    Heart disease Father  Heart disease Brother    Cancer Maternal Aunt    Ovarian cancer Maternal Aunt 15   Stroke Brother    Alzheimer's disease Brother    Diabetes Paternal Grandmother    Breast cancer Neg Hx    Social History   Socioeconomic History   Marital status: Married    Spouse name: Not on file   Number of children: Not on file   Years of education: Not on file   Highest education level: Associate degree: occupational, Scientist, product/process development, or vocational program  Occupational History   Not on file  Tobacco Use   Smoking status: Never   Smokeless tobacco: Never  Vaping Use   Vaping status: Never Used  Substance and Sexual Activity   Alcohol  use: No    Alcohol /week: 0.0 standard drinks of alcohol    Drug use: No   Sexual activity: Yes    Birth control/protection: Post-menopausal, Surgical  Other Topics Concern   Not on file  Social History Narrative   Lives in [place] with husband. No children. No pets.      Work -  retired Nature conservation officer   Diet - regular   Exercise - golf, gardening   Social Drivers of Corporate investment banker Strain: Low Risk  (10/12/2023)   Overall Financial Resource Strain (CARDIA)    Difficulty of Paying Living Expenses: Not hard at all  Food Insecurity: No Food Insecurity (10/12/2023)   Hunger Vital Sign    Worried About Running Out of Food in the Last Year: Never true    Ran Out of Food in the Last Year: Never true  Transportation Needs: No Transportation Needs (10/12/2023)   PRAPARE - Administrator, Civil Service (Medical): No    Lack of Transportation (Non-Medical): No  Physical Activity: Sufficiently Active (10/12/2023)   Exercise Vital Sign    Days of Exercise per Week: 4 days    Minutes of Exercise per Session: 60 min  Stress: No Stress Concern Present (10/12/2023)   [employer] of Occupational Health - Occupational Stress Questionnaire    Feeling of Stress: Not at all  Social Connections: Socially Integrated (10/12/2023)   Social Connection and Isolation Panel    Frequency of Communication with Friends and Family: Twice a week    Frequency of Social Gatherings with Friends and Family: Twice a week    Attends Religious Services: More than 4 times per year    Active Member of [employer] or Organizations: Yes    Attends Engineer, structural: More than 4 times per year    Marital Status: Married    Tobacco Counseling Counseling given: Not Answered    Clinical Intake:  Pre-visit preparation completed: Yes  Pain : No/denies pain     BMI - recorded: 32.2 Nutritional Status: BMI > 30  Obese Nutritional Risks: None Diabetes: No  Lab Results  Component Value Date   HGBA1C 6.0 09/21/2023   HGBA1C 5.9 09/12/2022   HGBA1C 6.1 11/04/2021     How often do you need to have someone help you when you read instructions, pamphlets, or other written materials from your doctor or pharmacy?: 1 - Never  Interpreter Needed?:  No  Information entered by :: R. Natalea Sutliff LPN   Activities of Daily Living     10/09/2023    8:13 AM 01/13/2023    7:56 AM  In your present state of health, do you have any difficulty performing the following activities:  Hearing? 0 0  Vision? 0 0  Difficulty concentrating or making decisions? 0 0  Walking or climbing stairs? 0   Dressing or bathing? 0   Doing errands, shopping? 0 0  Preparing Food and eating ? N   Using the Toilet? N   In the past six months, have you accidently leaked urine? N   Do you have problems with loss of bowel control? N   Managing your Medications? N   Managing your Finances? N   Housekeeping or managing your Housekeeping? N     Patient Care Team: Bair, Kalpana, MD as PCP - General (Family Medicine)  I have updated your Care Teams any recent Medical Services you may have received from other providers in the past year.     Assessment:   This is a routine wellness examination for Katherine Stanley.  Hearing/Vision screen Hearing Screening - Comments:: No issues Vision Screening - Comments:: readers   Goals Addressed             This Visit's Progress    Patient Stated       Wants to continue with personal trainer and lose weight       Depression Screen     10/12/2023    8:57 AM 10/05/2023    9:03 AM 09/21/2023   10:59 AM 03/16/2023    9:03 AM 01/23/2023   11:25 AM 10/10/2022    8:29 AM 09/12/2022   11:06 AM  PHQ 2/9 Scores  PHQ - 2 Score 0 0 0 0 0 0 0  PHQ- 9 Score 0 0 0 0 0 0 0    Fall Risk     10/09/2023    8:13 AM 10/05/2023    9:03 AM 09/21/2023   10:59 AM 03/16/2023    9:03 AM 01/23/2023   11:25 AM  Fall Risk   Falls in the past year? 0 0 0 0 0  Number falls in past yr: 0 0 0 0 0  Injury with Fall? 0 0 0 0 0  Risk for fall due to : No Fall Risks No Fall Risks No Fall Risks No Fall Risks No Fall Risks  Follow up Falls evaluation completed;Falls prevention discussed Falls evaluation completed Falls evaluation completed Falls evaluation  completed Falls evaluation completed    MEDICARE RISK AT HOME:  Medicare Risk at Home Any stairs in or around the home?: (Patient-Rptd) Yes If so, are there any without handrails?: (Patient-Rptd) No Home free of loose throw rugs in walkways, pet beds, electrical cords, etc?: (Patient-Rptd) No Adequate lighting in your home to reduce risk of falls?: (Patient-Rptd) Yes Life alert?: (Patient-Rptd) No Use of a cane, walker or w/c?: (Patient-Rptd) No Grab bars in the bathroom?: (Patient-Rptd) No Shower chair or bench in shower?: (Patient-Rptd) No Elevated toilet seat or a handicapped toilet?: (Patient-Rptd) Yes  TIMED UP AND GO:  Was the test performed?  No  Cognitive Function: 6CIT completed    08/31/2017    8:38 AM 08/28/2016    8:58 AM 08/29/2015    8:41 AM  MMSE - Mini Mental State Exam  Orientation to time 5 5  5    Orientation to Place 5 5  5    Registration 3 3  3    Attention/ Calculation 5 5  5    Recall 3 2  3    Language- name 2 objects 2 2  2    Language- repeat 1 1 1   Language- follow 3 step command 3 3  3    Language- read & follow direction 1 1  1  Write a sentence 1 1  1    Copy design 1 1  1    Total score 30 29  30       Data saved with a previous flowsheet row definition        10/12/2023    9:01 AM 10/10/2022    8:40 AM 09/03/2018   11:15 AM  6CIT Screen  What Year? 0 points 0 points 0 points  What month? 0 points 0 points 0 points  What time? 0 points 0 points 0 points  Count back from 20 0 points 0 points 0 points  Months in reverse 0 points 0 points 0 points  Repeat phrase 0 points 0 points 0 points  Total Score 0 points 0 points 0 points    Immunizations Immunization History  Administered Date(s) Administered   Fluad Quad(high Dose 65+) 01/03/2019, 01/27/2020, 03/18/2021, 02/10/2022   Fluad Trivalent(High Dose 65+) 01/23/2023   Influenza, High Dose Seasonal PF 01/16/2017, 02/19/2018   Influenza,inj,Quad PF,6+ Mos 02/24/2013, 01/23/2014, 02/28/2015,  01/28/2016   PFIZER Comirnaty(Gray Top)Covid-19 Tri-Sucrose Vaccine 10/17/2020   PFIZER(Purple Top)SARS-COV-2 Vaccination 05/24/2019, 06/14/2019, 02/02/2020   Pneumococcal Conjugate-13 07/20/2013   Pneumococcal Polysaccharide-23 07/24/2014   Td 07/16/2010    Screening Tests Health Maintenance  Topic Date Due   DTaP/Tdap/Td (2 - Tdap) 07/15/2020   COVID-19 Vaccine (5 - 2024-25 season) 12/14/2022   Medicare Annual Wellness (AWV)  10/10/2023   INFLUENZA VACCINE  11/13/2023   MAMMOGRAM  01/02/2024   DEXA SCAN  11/02/2024   Colonoscopy  03/03/2028   Pneumococcal Vaccine: 50+ Years  Completed   Hepatitis C Screening  Completed   Hepatitis B Vaccines  Aged Out   HPV VACCINES  Aged Out   Meningococcal B Vaccine  Aged Out   Zoster Vaccines- Shingrix  Discontinued    Health Maintenance  Health Maintenance Due  Topic Date Due   DTaP/Tdap/Td (2 - Tdap) 07/15/2020   COVID-19 Vaccine (5 - 2024-25 season) 12/14/2022   Medicare Annual Wellness (AWV)  10/10/2023   Health Maintenance Items Addressed: Discussed the need to update Tetanus (Tdap) vaccine. Patient declines shingles and covid vaccines  Additional Screening:  Vision Screening: Recommended annual ophthalmology exams for early detection of glaucoma and other disorders of the eye. Up to date [facility] Would you like a referral to an eye doctor? No    Dental Screening: Recommended annual dental exams for proper oral hygiene  Community Resource Referral / Chronic Care Management: CRR required this visit?  No   CCM required this visit?  No   Plan:    I have personally reviewed and noted the following in the patient's chart:   Medical and social history Use of alcohol , tobacco or illicit drugs  Current medications and supplements including opioid prescriptions. Patient is not currently taking opioid prescriptions. Functional ability and status Nutritional status Physical activity Advanced directives List of other  physicians Hospitalizations, surgeries, and ER visits in previous 12 months Vitals Screenings to include cognitive, depression, and falls Referrals and appointments  In addition, I have reviewed and discussed with patient certain preventive protocols, quality metrics, and best practice recommendations. A written personalized care plan for preventive services as well as general preventive health recommendations were provided to patient.   Angeline Fredericks, LPN   3/69/7974   After Visit Summary: (MyChart) Due to this being a telephonic visit, the after visit summary with patients personalized plan was offered to patient via MyChart   Notes: Nothing significant to report at this time.

## 2023-10-26 ENCOUNTER — Encounter

## 2023-10-29 ENCOUNTER — Encounter

## 2023-11-19 ENCOUNTER — Ambulatory Visit (INDEPENDENT_AMBULATORY_CARE_PROVIDER_SITE_OTHER): Admitting: Dermatology

## 2023-11-19 DIAGNOSIS — W57XXXA Bitten or stung by nonvenomous insect and other nonvenomous arthropods, initial encounter: Secondary | ICD-10-CM | POA: Diagnosis not present

## 2023-11-19 DIAGNOSIS — S40861A Insect bite (nonvenomous) of right upper arm, initial encounter: Secondary | ICD-10-CM | POA: Diagnosis not present

## 2023-11-19 MED ORDER — CLOBETASOL PROPIONATE 0.05 % EX OINT
TOPICAL_OINTMENT | CUTANEOUS | 0 refills | Status: AC
Start: 2023-11-19 — End: ?

## 2023-11-19 NOTE — Patient Instructions (Addendum)
 For bite at right upper arm  Start clobetasol  0.05 ointment - apply small amount topically to affected areas at right arm  twice daily as needed and cover until resolved. Can us  for up to 2 weeks.   Avoid applying to face, groin, and axilla. Use as directed. Long-term use can cause thinning of the skin..  Topical steroids (such as triamcinolone , fluocinolone , fluocinonide, mometasone, clobetasol , halobetasol, betamethasone , hydrocortisone) can cause thinning and lightening of the skin if they are used for too long in the same area. Your physician has selected the right strength medicine for your problem and area affected on the body. Please use your medication only as directed by your physician to prevent side effects.     Due to recent changes in healthcare laws, you may see results of your pathology and/or laboratory studies on MyChart before the doctors have had a chance to review them. We understand that in some cases there may be results that are confusing or concerning to you. Please understand that not all results are received at the same time and often the doctors may need to interpret multiple results in order to provide you with the best plan of care or course of treatment. Therefore, we ask that you please give us  2 business days to thoroughly review all your results before contacting the office for clarification. Should we see a critical lab result, you will be contacted sooner.   If You Need Anything After Your Visit  If you have any questions or concerns for your doctor, please call our main line at [phone] and press option 4 to reach your doctor's medical assistant. If no one answers, please leave a voicemail as directed and we will return your call as soon as possible. Messages left after 4 pm will be answered the following business day.   You may also send us  a message via MyChart. We typically respond to MyChart messages within 1-2 business days.  For prescription refills,  please ask your pharmacy to contact our office. Our fax number is [phone].  If you have an urgent issue when the clinic is closed that cannot wait until the next business day, you can page your doctor at the number below.    Please note that while we do our best to be available for urgent issues outside of office hours, we are not available 24/7.   If you have an urgent issue and are unable to reach us , you may choose to seek medical care at your doctor's office, retail clinic, urgent care center, or emergency room.  If you have a medical emergency, please immediately call 911 or go to the emergency department.  Pager Numbers  - Dr. Hester: [phone]  - Dr. Jackquline: [phone]  - Dr. Claudene: [phone]   In the event of inclement weather, please call our main line at [phone] for an update on the status of any delays or closures.  Dermatology Medication Tips: Please keep the boxes that topical medications come in in order to help keep track of the instructions about where and how to use these. Pharmacies typically print the medication instructions only on the boxes and not directly on the medication tubes.   If your medication is too expensive, please contact our office at [phone] option 4 or send us  a message through MyChart.   We are unable to tell what your co-pay for medications will be in advance as this is different depending on your insurance coverage. However, we may be able to  find a substitute medication at lower cost or fill out paperwork to get insurance to cover a needed medication.   If a prior authorization is required to get your medication covered by your insurance company, please allow us  1-2 business days to complete this process.  Drug prices often vary depending on where the prescription is filled and some pharmacies may offer cheaper prices.  The website www.goodrx.com contains coupons for medications through different pharmacies. The prices  here do not account for what the cost may be with help from insurance (it may be cheaper with your insurance), but the website can give you the price if you did not use any insurance.  - You can print the associated coupon and take it with your prescription to the pharmacy.  - You may also stop by our office during regular business hours and pick up a GoodRx coupon card.  - If you need your prescription sent electronically to a different pharmacy, notify our office through [facility] or by phone at [phone] option 4.     Si Usted Necesita Algo Despus de Su Visita  Tambin puede enviarnos un mensaje a travs de Clinical cytogeneticist. Por lo general respondemos a los mensajes de MyChart en el transcurso de 1 a 2 das hbiles.  Para renovar recetas, por favor pida a su farmacia que se ponga en contacto con nuestra oficina. Randi lakes de fax es [place] [phone].  Si tiene un asunto urgente cuando la clnica est cerrada y que no puede esperar hasta el siguiente da hbil, puede llamar/localizar a su doctor(a) al nmero que aparece a continuacin.   Por favor, tenga en cuenta que aunque hacemos todo lo posible para estar disponibles para asuntos urgentes fuera del horario de [place], no estamos disponibles las 24 horas del da, los 7 [address]  [address] de la [place].   Si tiene un problema urgente y no puede comunicarse con nosotros, puede optar por buscar atencin mdica  en el consultorio de su doctor(a), en una clnica privada, en un centro de atencin urgente o en una sala de emergencias.  Si tiene Engineer, drilling, por favor llame inmediatamente al 911 o vaya a la sala de emergencias.  Nmeros de bper  - Dr. Hester: [phone]  - Dra. Jackquline: [phone]  - Dr. Claudene: [phone]   En caso de inclemencias del tiempo, por favor llame a landry capes principal al [phone] para una actualizacin sobre el [place] de cualquier retraso o cierre.  Consejos para la medicacin en  dermatologa: Por favor, guarde las cajas en las que vienen los medicamentos de uso tpico para ayudarle a seguir las instrucciones sobre dnde y cmo usarlos. Las farmacias generalmente imprimen las instrucciones del medicamento slo en las cajas y no directamente en los tubos del [place].   Si su medicamento es muy caro, por favor, pngase en contacto con landry rieger llamando al [phone] y presione la opcin 4 o envenos un mensaje a travs de Clinical cytogeneticist.   No podemos decirle cul ser su copago por los medicamentos por adelantado ya que esto es diferente dependiendo de la cobertura de su seguro. Sin embargo, es posible que podamos encontrar un medicamento sustituto a Audiological scientist un formulario para que el seguro cubra el medicamento que se considera necesario.   Si se requiere una autorizacin previa para que su compaa de seguros malta su medicamento, por favor permtanos de 1 a 2 das hbiles para completar este proceso.  Los precios de los medicamentos varan con frecuencia  dependiendo del lugar de dnde se surte la receta y alguna farmacias pueden ofrecer precios ms baratos.  El sitio web www.goodrx.com tiene cupones para medicamentos de Health and safety inspector. Los precios aqu no tienen en cuenta lo que podra costar con la ayuda del seguro (puede ser ms barato con su seguro), pero el sitio web puede darle el precio si no utiliz Tourist information centre manager.  - Puede imprimir el cupn correspondiente y llevarlo con su receta a la farmacia.  - Tambin puede pasar por nuestra oficina durante el horario de atencin regular y Education officer, museum una tarjeta de cupones de GoodRx.  - Si necesita que su receta se enve electrnicamente a una farmacia diferente, informe a nuestra oficina a travs de MyChart de [place] o por telfono llamando al [phone] y presione la opcin 4.

## 2023-11-19 NOTE — Progress Notes (Signed)
   Follow-Up Visit   Subjective  Katherine Stanley is a 78 y.o. female who presents for the following:  Spot at right upper arm reports she was bitten by a spider 6 weeks ago, and will not go away, itchy  Did otc treatments   The following portions of the chart were reviewed this encounter and updated as appropriate: medications, allergies, medical history  Review of Systems:  No other skin or systemic complaints except as noted in HPI or Assessment and Plan.  Objective  Well appearing patient in no apparent distress; mood and affect are within normal limits.   A focused examination was performed of the following areas: Right upper arm  Relevant exam findings are noted in the Assessment and Plan.    Assessment & Plan   Bite reaction at right upper arm above antecubital  Exam: 7 mm light pink thin scaly papule at right upper arm above antecubital fossa, Hx of bite reaction spider bite, patient has been picking area  Treatment Plan: Benign-appearing. Observe Advised to avoid picking Start clobetasol  0.05 % ointment - apply a small amount to affected area bid and cover prn until resolved. Can use for up to 2 weeks.  15 g 0 rf Avoid f/g/axilla  Topical steroids (such as triamcinolone , fluocinolone , fluocinonide, mometasone, clobetasol , halobetasol, betamethasone , hydrocortisone) can cause thinning and lightening of the skin if they are used for too long in the same area. Your physician has selected the right strength medicine for your problem and area affected on the body. Please use your medication only as directed by your physician to prevent side effects.   Patient advised if too expensive to let us  know will send another rx   INSECT BITE OF RIGHT UPPER ARM WITH LOCAL REACTION, INITIAL ENCOUNTER   Related Medications clobetasol  ointment (TEMOVATE ) 0.05 % Apply topically a small amount to affected area at right upper arm bid prn and cover until healed. Can use up to 2 weeks.  Avoid applying to face, groin, and axilla. Use as directed.  Return if symptoms worsen or fail to improve.  I, Eleanor Blush, CMA, am acting as scribe for Rexene Rattler, MD.   Documentation: I have reviewed the above documentation for accuracy and completeness, and I agree with the above.  Rexene Rattler, MD

## 2023-11-24 ENCOUNTER — Other Ambulatory Visit: Payer: Self-pay

## 2023-11-24 DIAGNOSIS — Z1231 Encounter for screening mammogram for malignant neoplasm of breast: Secondary | ICD-10-CM

## 2023-11-30 ENCOUNTER — Ambulatory Visit (INDEPENDENT_AMBULATORY_CARE_PROVIDER_SITE_OTHER): Admitting: Sports Medicine

## 2023-11-30 ENCOUNTER — Other Ambulatory Visit (INDEPENDENT_AMBULATORY_CARE_PROVIDER_SITE_OTHER): Payer: Self-pay

## 2023-11-30 ENCOUNTER — Encounter: Payer: Self-pay | Admitting: Sports Medicine

## 2023-11-30 DIAGNOSIS — M79645 Pain in left finger(s): Secondary | ICD-10-CM | POA: Diagnosis not present

## 2023-11-30 DIAGNOSIS — G8929 Other chronic pain: Secondary | ICD-10-CM

## 2023-11-30 DIAGNOSIS — M25511 Pain in right shoulder: Secondary | ICD-10-CM

## 2023-11-30 DIAGNOSIS — M7551 Bursitis of right shoulder: Secondary | ICD-10-CM

## 2023-11-30 DIAGNOSIS — M65312 Trigger thumb, left thumb: Secondary | ICD-10-CM

## 2023-11-30 DIAGNOSIS — M12811 Other specific arthropathies, not elsewhere classified, right shoulder: Secondary | ICD-10-CM | POA: Diagnosis not present

## 2023-11-30 MED ORDER — LIDOCAINE HCL 1 % IJ SOLN
1.0000 mL | INTRAMUSCULAR | Status: AC | PRN
  Administered 2023-11-30: 1 mL

## 2023-11-30 MED ORDER — BUPIVACAINE HCL 0.25 % IJ SOLN
2.0000 mL | INTRAMUSCULAR | Status: AC | PRN
Start: 2023-11-30 — End: 2023-11-30
  Administered 2023-11-30: 2 mL via INTRA_ARTICULAR

## 2023-11-30 MED ORDER — METHYLPREDNISOLONE ACETATE 40 MG/ML IJ SUSP
40.0000 mg | INTRAMUSCULAR | Status: AC | PRN
  Administered 2023-11-30: 40 mg via INTRA_ARTICULAR

## 2023-11-30 MED ORDER — LIDOCAINE HCL 1 % IJ SOLN
2.0000 mL | INTRAMUSCULAR | Status: AC | PRN
  Administered 2023-11-30: 2 mL

## 2023-11-30 MED ORDER — BETAMETHASONE SOD PHOS & ACET 6 (3-3) MG/ML IJ SUSP
6.0000 mg | INTRAMUSCULAR | Status: AC | PRN
  Administered 2023-11-30: 6 mg via INTRA_ARTICULAR

## 2023-11-30 NOTE — Progress Notes (Signed)
 Katherine Stanley - 78 y.o. female MRN [id number]  Date of birth: [birth year]  Office Visit Note: Visit Date: 11/30/2023 PCP: Abbey Bruckner, MD Referred by: Abbey Bruckner, MD  Subjective: Chief Complaint  Patient presents with   Right Shoulder - Pain   Left Hand - Pain   HPI: Katherine Stanley is a pleasant 78 y.o. female who presents today for chronic right shoulder pain, as well as left trigger thumb x 6+ weeks.  Right shoulder -she has had a few months of right shoulder pain.  She reports a remote history of a rotator cuff repair about 10 years ago.  No specific injury to the shoulder.  She does have pain with certain motions and movement.  She is modifying her weights and working with a Systems analyst in the gym, does have discomfort with overhead activity.  She is using ice/heat as well as occasional Tylenol /Advil without significant relief.  Left thumb -she has noticed reproducible triggering episodes of the thumb for the last 6 weeks or so.  She did purchase an over-the-counter splint that keeps the IP joint in extension which certainly helps her triggering/catching but she is still having this persistent.  She does have to passively open the thumb over the last few weeks.  *Did review her medical history as well as notes from PCP.  She is prediabetic, she is diet controlled.  Her last A1c is below:  Lab Results  Component Value Date   HGBA1C 6.0 09/21/2023   Pertinent ROS were reviewed with the patient and found to be negative unless otherwise specified above in HPI.   Assessment & Plan: Visit Diagnoses:  1. Chronic right shoulder pain   2. Subacromial bursitis of right shoulder joint   3. Rotator cuff arthropathy of right shoulder   4. Trigger thumb, left thumb   5. Chronic pain of left thumb    Plan: Impression is chronic right shoulder pain with associated subacromial bursitis on examination today.  She has a remote history of rotator cuff repair 10+ years ago, she  does have some discomfort with activation of the rotator cuff.  We discussed activity modifications in the gym as well as getting her started in physical therapy.  She prefers home therapy versus formal PT.  Did print out a customized handout for her rotator cuff and I did review this with her in the room today.  She will perform once daily and make modifications with her personal trainer.  For her swelling and pain, we did proceed with subacromial joint injection, patient tolerated well.  She also has left thumb trigger thumb which has persisted 6+ weeks.  Discussed treatment for this such as splinting/bracing, injection therapy and surgical intervention, she would like to hold on surgery if at all possible.  We did perform trigger thumb injection, patient tolerated well.  I will follow back up with her in 1 month for reevaluation and consideration of an additional injection only if she has not had complete resolution of her triggering episodes.  She may continue use of Tylenol /Advil or ice for both her shoulder and thumb if needed.  Follow-up: Return in about 1 month (around 12/31/2023) for R-shoulder, L-thumb (30-min for poss inj).   Meds & Orders: No orders of the defined types were placed in this encounter.   Orders Placed This Encounter  Procedures   Large Joint Inj: R subacromial bursa   Hand/UE Inj: L thumb A1   XR Hand Complete Left   XR Shoulder  Right     Procedures: Large Joint Inj: R subacromial bursa on 11/30/2023 11:29 AM Indications: pain Details: 22 G 1.5 in needle, posterior approach Medications: 2 mL lidocaine  1 %; 2 mL bupivacaine  0.25 %; 40 mg methylPREDNISolone  acetate 40 MG/ML Outcome: tolerated well, no immediate complications  Subacromial Joint Injection, Right Shoulder After discussion on risks/benefits/indications, informed verbal consent was obtained. A timeout was then performed. Patient was seated on table in exam room. The patient's shoulder was prepped with betadine   and alcohol  swabs  and utilizing posterior approach a 22G, 1.5 needle was directed anteriorly and laterally into the patient's subacromial space was injected with 2:2:1 mixture of lidocaine :bupivicaine:depomedrol with appreciation of free-flowing of the injectate into the bursal space. Patient tolerated the procedure well without immediate complications.   Procedure, treatment alternatives, risks and benefits explained, specific risks discussed. Consent was given by the patient. Immediately prior to procedure a time out was called to verify the correct patient, procedure, equipment, support staff and site/side marked as required. Patient was prepped and draped in the usual sterile fashion.    Hand/UE Inj: L thumb A1 for trigger finger on 11/30/2023 11:29 AM Indications: pain and tendon swelling Details: 25 G needle, volar approach Medications: 1 mL lidocaine  1 %; 6 mg betamethasone  acetate-betamethasone  sodium phosphate  6 (3-3) MG/ML Outcome: tolerated well, no immediate complications  Trigger finger injection, thumb left  After discussion on risks/benefits/indications and informed verbal consent was obtained, a timeout was performed. The area of tenderness at the A1 pulley was identified with palpable nodule located and marked. This area was cleaned with chloraprep swab and alcohol  swabs . After sterile precautions were taken, a 25-gauge, 1.0 needle was inserted into the A1 pulley with 1.0cc:1.0cc mixture of Lidocaine  1% and Betamethasone  6mg /mL. Patient tolerated procedure well and was observed for at least 5 minutes after injection with resolution of pain and improved ROM.  Procedure, treatment alternatives, risks and benefits explained, specific risks discussed. Consent was given by the patient. Immediately prior to procedure a time out was called to verify the correct patient, procedure, equipment, support staff and site/side marked as required. Patient was prepped and draped in the usual sterile  fashion.          Clinical History: No specialty comments available.  She reports that she has never smoked. She has never used smokeless tobacco.  Recent Labs    09/21/23 1110  HGBA1C 6.0    Objective:    Physical Exam  Gen: Well-appearing, in no acute distress; non-toxic CV: Well-perfused. Warm.  Resp: Breathing unlabored on room air; no wheezing. Psych: Fluid speech in conversation; appropriate affect; normal thought process  Ortho Exam - Left hand/thumb: There is thickening of the A1 nodule over the volar aspect of the left thumb.  There is reproducible active and passive triggering on examination.  - Right shoulder: The right shoulder does have a small subacromial joint effusion present.  There is tenderness over the distal aspect of the acromion and near Codman's point.  There is full active and passive range of motion although pain at end range abduction.  There is associated pain and mild weakness with resisted abduction, positive empty can testing.  Imaging: XR Hand Complete Left Result Date: 11/30/2023 Three-view x-ray of the left hand including AP, lateral and oblique film were ordered and reviewed by myself.  X-rays demonstrate at least moderate CMC joint arthritic change with osteophytosis.  There is mild OA throughout the hand, no acute fracture noted.  XR Shoulder Right Result  Date: 11/30/2023 4 view x-ray of the right shoulder including AP, Grashey, scapular Y and axial view were ordered and reviewed by myself today.  X-ray demonstrates mild glenohumeral joint arthritic change.  There is a mildly depressed humeral head, possibly indicative of effusion.  There is bony spurring off the greater tuberosity of the lateral humeral head and the distal acromion.  Likely previous DCE.  No acute fracture noted.   Past Medical/Family/Surgical/Social History: Medications & Allergies reviewed per EMR, new medications updated. 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    Actinic keratosis    Acute appendicitis with localized peritonitis 01/13/2023   Arthritis    Diverticulosis    Headache    occasionally   Hypertension    takes Lisinopril  daily   Joint pain    Osteopenia    PONV (postoperative nausea and vomiting)    Slight nausea   Rosacea    Family History  Problem Relation Age of Onset   Hypertension Mother    Heart disease Mother    Kidney disease Mother    Diabetes Father    Hypertension Father    Heart disease Father    Heart disease Brother    Cancer Maternal Aunt    Ovarian cancer Maternal Aunt 22   Stroke Brother    Alzheimer's disease Brother    Diabetes Paternal Grandmother    Breast cancer Neg Hx    Past Surgical History:  Procedure Laterality Date   CATARACT EXTRACTION W/PHACO Left 07/02/2021   Procedure: CATARACT EXTRACTION PHACO AND INTRAOCULAR LENS PLACEMENT (IOC) LEFT EYHANCE 6.84 00:46.3;  Surgeon: Jaye Fallow, MD;  Location: [facility];  Service: Ophthalmology;  Laterality: Left;   CATARACT EXTRACTION W/PHACO Right 07/16/2021   Procedure: CATARACT EXTRACTION PHACO AND INTRAOCULAR LENS PLACEMENT (IOC) RIGHT EYHANCE 5.89 00:51.9;  Surgeon: Jaye Fallow, MD;  Location: [facility];  Service: Ophthalmology;  Laterality: Right;   CHOLECYSTECTOMY     CHOLECYSTECTOMY     COLONOSCOPY     COLONOSCOPY WITH PROPOFOL  N/A  11/25/2019   Procedure: COLONOSCOPY WITH PROPOFOL ;  Surgeon: Janalyn Keene NOVAK, MD;  Location: [facility];  Service: Gastroenterology;  Laterality: N/A;   COLONOSCOPY WITH PROPOFOL  N/A 03/04/2023   Procedure: COLONOSCOPY WITH PROPOFOL ;  Surgeon: Tye Millet, DO;  Location: [facility];  Service: General;  Laterality: N/A;   EYE SURGERY  2013   LASIK   JOINT REPLACEMENT     POLYPECTOMY  03/04/2023   Procedure: POLYPECTOMY;  Surgeon: Tye Millet, DO;  Location: [facility];  Service: General;;   REFRACTIVE SURGERY     SHOULDER ARTHROSCOPY WITH OPEN ROTATOR CUFF REPAIR AND DISTAL CLAVICLE ACROMINECTOMY Right 09/19/2014   Procedure: SHOULDER ARTHROSCOPY WITH MINI-OPEN ROTATOR CUFF REPAIR AND DISTAL CLAVICLE RESECTION, SUBACROMIAL DECOMPRESSION;  Surgeon: Maude LELON Right, MD;  Location: [facility];  Service: Orthopedics;  Laterality: Right;   TONSILLECTOMY  at age 89   TOTAL HIP  ARTHROPLASTY Left 11/20/2020   Procedure: LEFT TOTAL HIP ARTHROPLASTY ANTERIOR APPROACH;  Surgeon: Vernetta Lonni GRADE, MD;  Location: [facility];  Service: Orthopedics;  Laterality: Left;  RNFA   TOTAL HIP ARTHROPLASTY Right 04/08/2022   Procedure: RIGHT TOTAL HIP ARTHROPLASTY;  Surgeon: Vernetta Lonni GRADE, MD;  Location: [facility];  Service: Orthopedics;  Laterality: Right;  Needs RNFA   TOTAL KNEE ARTHROPLASTY Right 03/18/2016   TOTAL KNEE ARTHROPLASTY Right 03/18/2016   Procedure: TOTAL KNEE ARTHROPLASTY;  Surgeon: Maude LELON Right, MD;  Location: [facility];  Service: Orthopedics;  Laterality: Right;   VAGINAL HYSTERECTOMY  at age 35   Social History   Occupational History   Not on file  Tobacco Use   Smoking status: Never   Smokeless tobacco: Never  Vaping Use   Vaping status: Never Used  Substance and Sexual Activity   Alcohol  use: No    Alcohol /week: 0.0 standard drinks of alcohol    Drug use: No   Sexual activity: Yes    Birth control/protection: Post-menopausal, Surgical   Patient was  instructed in 10 minutes of therapeutic exercises for right shoulder/RC to improve strength, ROM and function according to my instructions and plan of care by myself during the office visit. A customized handout was provided and demonstration of proper technique shown and discussed. Patient did perform exercises and demonstrate understanding through teachback.  All questions discussed and answered.

## 2023-11-30 NOTE — Progress Notes (Signed)
 Patient says that she has had left trigger thumb for about 6 weeks now. She does have consistent triggering when she bends the thumb, as well as pain. She ordered a splint for her thumb which does help with pain and keeps it straight so it does not pop/stick. She has used heat for the thumb which does not give much relief.  Patient says that she had her right rotator cuff repaired about 10 years ago. She began having pain again in June. She denies any new injury to the right shoulder, and denies any popping, clicking, or pulling. Her pain is not consistent, but she will often feel it with movement. She has modified her weights she is lifting with her personal trainer with the right arm, and often does lighter weight with the right arm than the left. She works with her Systems analyst twice a week, and typically plays golf two to three times a week. She has used ice for the shoulder, but does not notice much relief.  Patient uses Tylenol  and Ibuprofen at night, which does help her pain and helps her sleep.

## 2023-12-28 ENCOUNTER — Encounter: Payer: Self-pay | Admitting: Sports Medicine

## 2023-12-28 ENCOUNTER — Ambulatory Visit (INDEPENDENT_AMBULATORY_CARE_PROVIDER_SITE_OTHER): Admitting: Sports Medicine

## 2023-12-28 DIAGNOSIS — M65312 Trigger thumb, left thumb: Secondary | ICD-10-CM

## 2023-12-28 DIAGNOSIS — M12811 Other specific arthropathies, not elsewhere classified, right shoulder: Secondary | ICD-10-CM | POA: Diagnosis not present

## 2023-12-28 DIAGNOSIS — M25511 Pain in right shoulder: Secondary | ICD-10-CM

## 2023-12-28 DIAGNOSIS — G8929 Other chronic pain: Secondary | ICD-10-CM

## 2023-12-28 MED ORDER — BETAMETHASONE SOD PHOS & ACET 6 (3-3) MG/ML IJ SUSP
6.0000 mg | INTRAMUSCULAR | Status: AC | PRN
  Administered 2023-12-28: 6 mg via INTRA_ARTICULAR

## 2023-12-28 MED ORDER — LIDOCAINE HCL 1 % IJ SOLN
1.0000 mL | INTRAMUSCULAR | Status: AC | PRN
  Administered 2023-12-28: 1 mL

## 2023-12-28 NOTE — Progress Notes (Signed)
 Patient says that she notices improvements with both the shoulder and the thumb. She says that tasks such as bathing have become easier and less painful with the shoulder. She continues to do her exercises daily. Patient also says that her thumb is much less painful, and it catches much less often. She is interested in a second injection today, and believes that will eliminate her triggering completely.

## 2023-12-28 NOTE — Progress Notes (Signed)
 Katherine Stanley - 78 y.o. female MRN [id number]  Date of birth: 10-23-1945  Office Visit Note: Visit Date: 12/28/2023 PCP: Abbey Bruckner, MD Referred by: Abbey Bruckner, MD  Subjective: Chief Complaint  Patient presents with   Right Shoulder - Follow-up   Left Hand - Follow-up   HPI: Katherine Stanley is a pleasant 78 y.o. female who presents today for follow-up of chronic right shoulder pain as well as left trigger thumb.  Right shoulder -this is doing much better after subacromial joint injection.  Still has some stiffness with reaching across her body and overhead but in general much improved.  She is doing her home exercises on a near daily basis.  Left thumb -she has had a significant reduction in her triggering episodes, this is now only happening a few times a week but certainly not daily.  She feels at least 75-80% better but does feel 1 additional injection would help relieve her catching sensation.  Pertinent ROS were reviewed with the patient and found to be negative unless otherwise specified above in HPI.   Assessment & Plan: Visit Diagnoses:  1. Chronic right shoulder pain   2. Rotator cuff arthropathy of right shoulder   3. Trigger thumb, left thumb    Plan: Impression is left trigger thumb which has significantly improved but not fully resolved after 1 trigger finger injection.  Through shared decision making, we did proceed with an additional trigger thumb injection into the A1 pulley, patient tolerated well.  Band-Aid splint applied and activity modification for the next 72 hours.  She may use her athletic tape/brace as needed.  Her right shoulder is doing better status post subacromial injection and therapy exercises, she will continue her HEP on a daily basis for the next month and then transition to as needed/few times weekly.  She will use over-the-counter anti-inflammatories only as needed.  Follow-up as needed.  Follow-up: Return if symptoms worsen or fail to  improve.   Meds & Orders: No orders of the defined types were placed in this encounter.   Orders Placed This Encounter  Procedures   Hand/UE Inj     Procedures: Hand/UE Inj: L thumb A1 for trigger finger on 12/28/2023 9:07 AM Indications: pain and tendon swelling Details: 25 G needle, volar approach Medications: 1 mL lidocaine  1 %; 6 mg betamethasone  acetate-betamethasone  sodium phosphate  6 (3-3) MG/ML Outcome: tolerated well, no immediate complications  Trigger finger injection, thumb left  After discussion on risks/benefits/indications and informed verbal consent was obtained, a timeout was performed. The area of tenderness at the A1 pulley was identified with palpable nodule located and marked. This area was cleaned with chloraprep swab and alcohol  swabs . After sterile precautions were taken, a 25-gauge, 1.0 needle was inserted into the A1 pulley with 1.0cc:1.0cc mixture of Lidocaine  1% and Betamethasone  6mg /mL. Patient tolerated procedure well and was observed for at least 5 minutes after injection with resolution of pain and improved ROM.  Procedure, treatment alternatives, risks and benefits explained, specific risks discussed. Consent was given by the patient. Immediately prior to procedure a time out was called to verify the correct patient, procedure, equipment, support staff and site/side marked as required. Patient was prepped and draped in the usual sterile fashion.          Clinical History: No specialty comments available.  She reports that she has never smoked. She has never used smokeless tobacco.  Recent Labs    09/21/23 1110  HGBA1C 6.0    Objective:  Physical Exam  Gen: Well-appearing, in no acute distress; non-toxic CV: Well-perfused. Warm.  Resp: Breathing unlabored on room air; no wheezing. Psych: Fluid speech in conversation; appropriate affect; normal thought process  Ortho Exam - Right shoulder: Improved tenderness.  Mild pain with full range  external rotation and abduction although improved from last visit.  - Left thumb: There is thickening over the A1 nodule on the volar aspect of the left thumb.  There is no reproducible triggering on examination today although a mild 'catching' sensation is reported by the patient with repetitive opening close fist.  There is arthritic nodules of the MCP, IP and DIP joints.  Imaging: No results found.  Past Medical/Family/Surgical/Social History: Medications & Allergies reviewed per EMR, new medications updated. 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    Actinic keratosis    Acute appendicitis with localized peritonitis 01/13/2023   Arthritis    Diverticulosis    Headache    occasionally   Hypertension    takes Lisinopril  daily   Joint pain    Osteopenia    PONV (postoperative nausea and vomiting)    Slight nausea   Rosacea    Family History  Problem Relation Age of Onset   Hypertension Mother    Heart disease Mother    Kidney disease Mother    Diabetes Father    Hypertension Father    Heart disease Father    Heart disease Brother    Cancer Maternal Aunt    Ovarian cancer Maternal Aunt 10   Stroke Brother    Alzheimer's disease Brother    Diabetes Paternal Grandmother    Breast cancer Neg Hx    Past Surgical History:  Procedure Laterality Date   CATARACT EXTRACTION W/PHACO Left 07/02/2021    Procedure: CATARACT EXTRACTION PHACO AND INTRAOCULAR LENS PLACEMENT (IOC) LEFT EYHANCE 6.84 00:46.3;  Surgeon: Jaye Fallow, MD;  Location: [facility];  Service: Ophthalmology;  Laterality: Left;   CATARACT EXTRACTION W/PHACO Right 07/16/2021   Procedure: CATARACT EXTRACTION PHACO AND INTRAOCULAR LENS PLACEMENT (IOC) RIGHT EYHANCE 5.89 00:51.9;  Surgeon: Jaye Fallow, MD;  Location: [facility];  Service: Ophthalmology;  Laterality: Right;   CHOLECYSTECTOMY     CHOLECYSTECTOMY     COLONOSCOPY     COLONOSCOPY WITH PROPOFOL  N/A 11/25/2019   Procedure: COLONOSCOPY WITH PROPOFOL ;  Surgeon: Janalyn Keene NOVAK, MD;  Location: [facility];  Service: Gastroenterology;  Laterality: N/A;   COLONOSCOPY WITH PROPOFOL  N/A 03/04/2023   Procedure: COLONOSCOPY WITH PROPOFOL ;  Surgeon: Tye Millet, DO;  Location: [facility];  Service: General;  Laterality: N/A;   EYE SURGERY  2013   LASIK   JOINT REPLACEMENT     POLYPECTOMY  03/04/2023   Procedure: POLYPECTOMY;  Surgeon: Tye Millet, DO;  Location: [facility];  Service: General;;   REFRACTIVE SURGERY     SHOULDER ARTHROSCOPY WITH OPEN ROTATOR CUFF REPAIR AND DISTAL CLAVICLE ACROMINECTOMY Right 09/19/2014  Procedure: SHOULDER ARTHROSCOPY WITH MINI-OPEN ROTATOR CUFF REPAIR AND DISTAL CLAVICLE RESECTION, SUBACROMIAL DECOMPRESSION;  Surgeon: Maude LELON Right, MD;  Location: [facility];  Service: Orthopedics;  Laterality: Right;   TONSILLECTOMY  at age 48   TOTAL HIP ARTHROPLASTY Left 11/20/2020   Procedure: LEFT TOTAL HIP ARTHROPLASTY ANTERIOR APPROACH;  Surgeon: Vernetta Lonni GRADE, MD;  Location: [facility];  Service: Orthopedics;  Laterality: Left;  RNFA   TOTAL HIP ARTHROPLASTY Right 04/08/2022   Procedure: RIGHT TOTAL HIP ARTHROPLASTY;  Surgeon: Vernetta Lonni GRADE, MD;  Location: [facility];  Service: Orthopedics;  Laterality: Right;  Needs RNFA   TOTAL KNEE ARTHROPLASTY Right 03/18/2016   TOTAL KNEE ARTHROPLASTY Right  03/18/2016   Procedure: TOTAL KNEE ARTHROPLASTY;  Surgeon: Maude LELON Right, MD;  Location: [facility];  Service: Orthopedics;  Laterality: Right;   VAGINAL HYSTERECTOMY  at age 76   Social History   Occupational History   Not on file  Tobacco Use   Smoking status: Never   Smokeless tobacco: Never  Vaping Use   Vaping status: Never Used  Substance and Sexual Activity   Alcohol  use: No    Alcohol /week: 0.0 standard drinks of alcohol    Drug use: No   Sexual activity: Yes    Birth control/protection: Post-menopausal, Surgical

## 2024-01-04 ENCOUNTER — Ambulatory Visit
Admission: RE | Admit: 2024-01-04 | Discharge: 2024-01-04 | Disposition: A | Discharge status: Home / Self Care | Source: Ambulatory Visit

## 2024-01-04 DIAGNOSIS — Z1231 Encounter for screening mammogram for malignant neoplasm of breast: Secondary | ICD-10-CM | POA: Insufficient documentation

## 2024-01-25 ENCOUNTER — Encounter

## 2024-02-15 ENCOUNTER — Encounter: Payer: Self-pay | Admitting: Radiology

## 2024-02-15 DIAGNOSIS — Z961 Presence of intraocular lens: Secondary | ICD-10-CM | POA: Diagnosis not present

## 2024-02-15 DIAGNOSIS — H26493 Other secondary cataract, bilateral: Secondary | ICD-10-CM | POA: Diagnosis not present

## 2024-02-15 DIAGNOSIS — H43813 Vitreous degeneration, bilateral: Secondary | ICD-10-CM | POA: Diagnosis not present

## 2024-02-15 DIAGNOSIS — H04123 Dry eye syndrome of bilateral lacrimal glands: Secondary | ICD-10-CM | POA: Diagnosis not present

## 2024-02-22 ENCOUNTER — Other Ambulatory Visit: Payer: Self-pay | Admitting: Internal Medicine

## 2024-02-22 DIAGNOSIS — M810 Age-related osteoporosis without current pathological fracture: Secondary | ICD-10-CM

## 2024-02-22 NOTE — Telephone Encounter (Signed)
 Not [facility] patient

## 2024-03-02 ENCOUNTER — Other Ambulatory Visit: Payer: Self-pay

## 2024-03-02 DIAGNOSIS — M810 Age-related osteoporosis without current pathological fracture: Secondary | ICD-10-CM

## 2024-03-17 ENCOUNTER — Other Ambulatory Visit (HOSPITAL_COMMUNITY): Payer: Self-pay

## 2024-03-17 ENCOUNTER — Telehealth: Payer: Self-pay

## 2024-03-17 NOTE — Telephone Encounter (Signed)
 Pharmacy Patient Advocate Encounter   Received notification from Physician's Office that prior authorization for Risedronate  Sodium 35MG  tablets is required/requested.   Insurance verification completed.   The patient is insured through [place].   Per test claim: PA required; PA submitted to above mentioned insurance via Latent Key/confirmation #/EOC [id number] Status is pending

## 2024-03-24 ENCOUNTER — Other Ambulatory Visit (INDEPENDENT_AMBULATORY_CARE_PROVIDER_SITE_OTHER)

## 2024-03-24 ENCOUNTER — Ambulatory Visit: Payer: Self-pay

## 2024-03-24 DIAGNOSIS — Z13220 Encounter for screening for lipoid disorders: Secondary | ICD-10-CM | POA: Diagnosis not present

## 2024-03-24 DIAGNOSIS — R7303 Prediabetes: Secondary | ICD-10-CM | POA: Diagnosis not present

## 2024-03-24 DIAGNOSIS — E66811 Obesity, class 1: Secondary | ICD-10-CM

## 2024-03-24 DIAGNOSIS — I10 Essential (primary) hypertension: Secondary | ICD-10-CM | POA: Diagnosis not present

## 2024-03-24 DIAGNOSIS — M810 Age-related osteoporosis without current pathological fracture: Secondary | ICD-10-CM

## 2024-03-24 DIAGNOSIS — Z6832 Body mass index (BMI) 32.0-32.9, adult: Secondary | ICD-10-CM | POA: Diagnosis not present

## 2024-03-24 LAB — COMPREHENSIVE METABOLIC PANEL WITH GFR
ALT: 17 U/L (ref 0–35)
AST: 15 U/L (ref 0–37)
Albumin: 4.5 g/dL (ref 3.5–5.2)
Alkaline Phosphatase: 61 U/L (ref 39–117)
BUN: 20 mg/dL (ref 6–23)
CO2: 24 meq/L (ref 19–32)
Calcium: 9.5 mg/dL (ref 8.4–10.5)
Chloride: 105 meq/L (ref 96–112)
Creatinine, Ser: 0.59 mg/dL (ref 0.40–1.20)
GFR: 87.1 mL/min (ref >60.00)
Glucose, Bld: 108 mg/dL — ABNORMAL HIGH (ref 70–99)
Potassium: 4.2 meq/L (ref 3.5–5.1)
Sodium: 138 meq/L (ref 135–145)
Total Bilirubin: 0.4 mg/dL (ref 0.2–1.2)
Total Protein: 6.6 g/dL (ref 6.0–8.3)

## 2024-03-24 LAB — VITAMIN D 25 HYDROXY (VIT D DEFICIENCY, FRACTURES): VITD: 40.13 ng/mL (ref 30.00–100.00)

## 2024-03-24 LAB — HEMOGLOBIN A1C: Hgb A1c MFr Bld: 5.7 % (ref 4.6–6.5)

## 2024-03-24 LAB — LIPID PANEL
Cholesterol: 156 mg/dL (ref 0–200)
HDL: 44.4 mg/dL (ref >39.00)
LDL Cholesterol: 66 mg/dL (ref 0–99)
NonHDL: 111.71
Total CHOL/HDL Ratio: 4
Triglycerides: 228 mg/dL — ABNORMAL HIGH (ref 0.0–149.0)
VLDL: 45.6 mg/dL — ABNORMAL HIGH (ref 0.0–40.0)

## 2024-03-24 LAB — VITAMIN B12: Vitamin B-12: 1133 pg/mL — ABNORMAL HIGH (ref 211–911)

## 2024-03-24 NOTE — Progress Notes (Signed)
 Hold results to discuss during upcoming office visit.   Katherine Shade, MD

## 2024-03-28 NOTE — Telephone Encounter (Signed)
 Pharmacy Patient Advocate Encounter  Received notification from [employer] that Prior Authorization for  Risedronate  Sodium 35MG  tablets has been DENIED.  Full denial letter will be uploaded to the media tab. See denial reason below.  Non-formulary drug; requires trial of preferred ibandronate tablet or provider justification of ineffectiveness/adverse effects per [facility] Non-Formulary Exceptions policy.   PA #/Case ID/Reference #: [id number]

## 2024-03-31 ENCOUNTER — Ambulatory Visit

## 2024-03-31 VITALS — BP 118/76 | HR 88 | Temp 98.0°F | Ht 60.3 in | Wt 167.2 lb

## 2024-03-31 DIAGNOSIS — Z23 Encounter for immunization: Secondary | ICD-10-CM

## 2024-03-31 DIAGNOSIS — M810 Age-related osteoporosis without current pathological fracture: Secondary | ICD-10-CM

## 2024-03-31 DIAGNOSIS — R7989 Other specified abnormal findings of blood chemistry: Secondary | ICD-10-CM | POA: Diagnosis not present

## 2024-03-31 DIAGNOSIS — I10 Essential (primary) hypertension: Secondary | ICD-10-CM | POA: Diagnosis not present

## 2024-03-31 DIAGNOSIS — R7303 Prediabetes: Secondary | ICD-10-CM | POA: Diagnosis not present

## 2024-03-31 DIAGNOSIS — E782 Mixed hyperlipidemia: Secondary | ICD-10-CM

## 2024-03-31 NOTE — Assessment & Plan Note (Addendum)
 Osteoporosis managed with risedronate . Insurance no longer covers it, but she obtains it through [employer]. Fosamax  caused severe body aches, making risedronate  preferred. Continue risedronate  35 mg once a week via GoodRx. Last  DEXA  11/03/22, repeat after 10/2024. Continue Vitamin D  1000 units daily. Check vitamin D  in 6 months.

## 2024-03-31 NOTE — Telephone Encounter (Signed)
 Discussed with patient about insurance declining coverage on Risedronate . This has never been covered by her pharmacy and she uses GoodRx coupon for this (costs about $50 for 3 months) and continue to stay on Risedronate  35 mg once weekly.   Luke Shade, MD

## 2024-03-31 NOTE — Assessment & Plan Note (Signed)
 Held off on B12 (1000 mcg daily) supplementation. Will repeat B12 levels at next visit.

## 2024-03-31 NOTE — Progress Notes (Signed)
 Results dicussed during office visit on 03/31/24.   Luke Shade, MD

## 2024-03-31 NOTE — Assessment & Plan Note (Signed)
 BP within goal, continue Lisinopril  5 mg (1/2 tab of 10 mg daily). Recent CMP with stable renal function. Lad result discussed.

## 2024-03-31 NOTE — Progress Notes (Signed)
 Established Patient Office Visit    Subjective  Patient ID: Katherine Stanley, female    DOB: Sep 19, 1945  Age: 78 y.o. 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  HPI - Hypertension:   She takes Lisinopril  5 mg (1/2 of 10 mg tablet). No concerns. CMP has been stable.   - Osteoporosis: Risedronate  35 mg once a week (insurance does not cover this). She uses GoodRX coupon for this and gets it for about $54 for 3 months. Takes Vitamin D  1000 units once a day. Last DEXA 11/03/2022 (T-score -2.9).  - Prediabetes:A1c has been stable.   - Recent lab showed elevated B12, she was taking 1000 mcg b12 which she will hold off till her next labs.   ROS As per HPI    Objective:     BP 118/76   Pulse 88   Temp 98 F (36.7 C)   Ht 5' 0.3 (1.532 m)   Wt 167 lb 3.2 oz (75.8 kg)   SpO2 94%   BMI 32.33 kg/m      03/31/2024   10:01 AM 10/12/2023    8:57 AM 10/05/2023    9:03 AM  Depression screen PHQ 2/9  Decreased Interest 0 0 0  Down, Depressed, Hopeless 0 0 0  PHQ - 2 Score 0 0 0  Altered sleeping 0 0 0  Tired, decreased energy 0 0 0  Change in appetite 0 0 0  Feeling bad or failure about yourself  0 0 0  Trouble concentrating 0 0 0  Moving slowly or fidgety/restless 0 0 0  Suicidal thoughts 0 0 0  PHQ-9 Score 0 0  0   Difficult doing work/chores Not difficult at all Not difficult at all Not difficult at all     Data saved with a previous flowsheet row definition      03/31/2024   10:01 AM 10/05/2023    9:03 AM 09/21/2023   10:59 AM 03/16/2023    9:03 AM  GAD 7 : Generalized Anxiety Score  Nervous, Anxious, on  Edge 0 0 0 0  Control/stop worrying 0 0 0 0  Worry too much - different things 0 0 0 0  Trouble relaxing 0 0 0 0  Restless 0 0 0 0  Easily annoyed or irritable 0 0 0 0  Afraid - awful might happen 0 0 0 0  Total GAD 7 Score 0 0 0 0  Anxiety Difficulty Not difficult at all Not difficult at all Not difficult at all Not difficult at all      03/31/2024   10:01 AM 10/12/2023    8:57 AM 10/05/2023    9:03 AM  Depression screen PHQ 2/9  Decreased Interest 0 0 0  Down, Depressed, Hopeless 0 0 0  PHQ - 2 Score 0 0 0  Altered sleeping 0 0 0  Tired, decreased energy 0 0 0  Change in appetite 0 0 0  Feeling bad or failure about yourself  0 0 0  Trouble concentrating 0 0 0  Moving slowly or fidgety/restless 0 0 0  Suicidal thoughts 0 0 0  PHQ-9 Score 0 0  0   Difficult doing work/chores Not difficult at all Not  difficult at all Not difficult at all     Data saved with a previous flowsheet row definition      03/31/2024   10:01 AM 10/05/2023    9:03 AM 09/21/2023   10:59 AM 03/16/2023    9:03 AM  GAD 7 : Generalized Anxiety Score  Nervous, Anxious, on Edge 0 0 0 0  Control/stop worrying 0 0 0 0  Worry too much - different things 0 0 0 0  Trouble relaxing 0 0 0 0  Restless 0 0 0 0  Easily annoyed or irritable 0 0 0 0  Afraid - awful might happen 0 0 0 0  Total GAD 7 Score 0 0 0 0  Anxiety Difficulty Not difficult at all Not difficult at all Not difficult at all Not difficult at all   SDOH Screenings   Food Insecurity: Patient Declined (03/28/2024)  Housing: Unknown (03/28/2024)  Transportation Needs: No Transportation Needs (03/28/2024)  Utilities: Not At Risk (10/12/2023)  Alcohol  Screen: Low Risk (10/12/2023)  Depression (PHQ2-9): Low Risk (03/31/2024)  Financial Resource Strain: Patient Declined (03/28/2024)  Physical Activity: Insufficiently Active (03/28/2024)  Social Connections: Moderately Integrated (03/28/2024)  Stress: No Stress Concern Present (03/28/2024)  Tobacco  Use: Low Risk (03/31/2024)  Health Literacy: Adequate Health Literacy (10/12/2023)     Physical Exam Constitutional:      General: She is not in acute distress.    Appearance: Normal appearance.  HENT:     Head: Normocephalic and atraumatic.     Right Ear: Tympanic membrane normal.     Left Ear: Tympanic membrane normal.     Mouth/Throat:     Mouth: Mucous membranes are moist.  Neck:     Thyroid : No thyroid  mass or thyroid  tenderness.  Cardiovascular:     Rate and Rhythm: Normal rate and regular rhythm.  Pulmonary:     Effort: Pulmonary effort is normal.     Breath sounds: Normal breath sounds. No wheezing.  Abdominal:     General: Bowel sounds are normal.     Palpations: Abdomen is soft.     Tenderness: There is no abdominal tenderness. There is no guarding.  Musculoskeletal:     Cervical back: Neck supple. No rigidity or tenderness.     Right lower leg: No edema.     Left lower leg: No edema.  Lymphadenopathy:     Cervical: No cervical adenopathy.  Skin:    General: Skin is warm.  Neurological:     Mental Status: She is alert and oriented to person, place, and time.     Gait: Gait is intact. Gait normal.  Psychiatric:        Mood and Affect: Mood normal.        Behavior: Behavior normal. Behavior is cooperative.     No results found for any visits on 03/31/24.  The 10-year ASCVD risk score (Arnett DK, et al., 2019) is: 21.8%    Following lab results discussed:  Component     Latest Ref Rng 03/24/2024  Sodium     135 - 145 mEq/L 138   Potassium     3.5 - 5.1 mEq/L 4.2   Chloride     96 - 112 mEq/L 105   CO2     19 - 32 mEq/L 24   Glucose     70 - 99 mg/dL 891 (H)   BUN     6 - 23 mg/dL 20   Creatinine     9.59 - 1.20 mg/dL  0.59   Total Bilirubin     0.2 - 1.2 mg/dL 0.4   Alkaline Phosphatase     39 - 117 U/L 61   AST     0 - 37 U/L 15   ALT     0 - 35 U/L 17   Total Protein     6.0 - 8.3 g/dL 6.6   Albumin     3.5 - 5.2 g/dL 4.5   GFR      >39.99 mL/min 87.10   Calcium     8.4 - 10.5 mg/dL 9.5   Cholesterol     0 - 200 mg/dL 843   Triglycerides     0.0 - 149.0 mg/dL 771.9 (H)   HDL Cholesterol     >39.00 mg/dL 55.59   VLDL     0.0 - 40.0 mg/dL 54.3 (H)   LDL (calc)     0 - 99 mg/dL 66   Total CHOL/HDL Ratio 4   NonHDL 111.71   VITD     30.00 - 100.00 ng/mL 40.13   Vitamin B12     211 - 911 pg/mL 1,133 (H)   Hemoglobin A1C     4.6 - 6.5 % 5.7      Assessment & Plan:   Primary hypertension Assessment & Plan: BP within goal, continue Lisinopril  5 mg (1/2 tab of 10 mg daily). Recent CMP with stable renal function. Lad result discussed.    Prediabetes Assessment & Plan: A1c stable. Continue healthy diet, (she does not consume carbohydrate or sugary food on regular basis). Repeat A1c in 6 months.   Orders: -     Hemoglobin A1c; Future  Age-related osteoporosis without current pathological fracture Assessment & Plan: Osteoporosis managed with risedronate . Insurance no longer covers it, but she obtains it through [employer]. Fosamax  caused severe body aches, making risedronate  preferred. Continue risedronate  35 mg once a week via GoodRx. Last  DEXA  11/03/22, repeat after 10/2024. Continue Vitamin D  1000 units daily. Check vitamin D  in 6 months.   Orders: -     VITAMIN D  25 Hydroxy (Vit-D Deficiency, Fractures); Future -     Comprehensive metabolic panel with GFR; Future  Immunization due Assessment & Plan: Updated influenza immunization today.   Orders: -     Flu vaccine HIGH DOSE PF(Fluzone Trivalent)  High serum vitamin B12 Assessment & Plan: Held off on B12 (1000 mcg daily) supplementation. Will repeat B12 levels at next visit.  Orders: -     Vitamin B12; Future  Elevated triglycerides with high cholesterol Assessment & Plan: LDL excellent, triglycerides mildly elevated. Elevated cardiovascular risk due to age and family history. Prefers dietary management over statins. Encouraged continuing healthy  eating, regular exercise for triglyceride improvement.   Orders: -     Lipid panel; Future   Return in about 6 months (around 09/29/2024) for chronic follow up, fasting labs 2 days before apt.   Luke Shade, MD

## 2024-03-31 NOTE — Assessment & Plan Note (Signed)
 LDL excellent, triglycerides mildly elevated. Elevated cardiovascular risk due to age and family history. Prefers dietary management over statins. Encouraged continuing healthy eating, regular exercise for triglyceride improvement.

## 2024-03-31 NOTE — Assessment & Plan Note (Signed)
 Updated influenza immunization today.

## 2024-03-31 NOTE — Assessment & Plan Note (Signed)
 A1c stable. Continue healthy diet, (she does not consume carbohydrate or sugary food on regular basis). Repeat A1c in 6 months.

## 2024-04-08 ENCOUNTER — Other Ambulatory Visit

## 2024-04-11 ENCOUNTER — Ambulatory Visit

## 2024-05-05 ENCOUNTER — Other Ambulatory Visit: Payer: Self-pay | Admitting: Medical Genetics

## 2024-05-11 MED ORDER — RISEDRONATE SODIUM 35 MG PO TABS: 35.0000 mg | ORAL_TABLET | ORAL | 3 refills | Status: AC

## 2024-05-11 NOTE — Telephone Encounter (Signed)
 PA has been submitted and documented in separate encounter, please sign off on rx in this encounter as PA team is unable to resolve RX requests unless signed. Thank you!

## 2024-05-11 NOTE — Addendum Note (Signed)
 Addended by: Namiah Dunnavant on: 05/11/2024 11:55 AM   Modules accepted: Orders

## 2024-05-16 ENCOUNTER — Other Ambulatory Visit

## 2024-05-19 ENCOUNTER — Other Ambulatory Visit

## 2024-05-20 ENCOUNTER — Inpatient Hospital Stay: Admission: RE | Admit: 2024-05-20 | Payer: Self-pay

## 2024-06-15 ENCOUNTER — Other Ambulatory Visit

## 2024-06-22 ENCOUNTER — Ambulatory Visit

## 2024-08-15 ENCOUNTER — Encounter: Admitting: Dermatology

## 2024-10-03 ENCOUNTER — Other Ambulatory Visit

## 2024-10-06 ENCOUNTER — Ambulatory Visit

## 2024-10-17 ENCOUNTER — Ambulatory Visit
